# Patient Record
Sex: Male | Born: 1940 | Race: White | Hispanic: No | Marital: Married | Smoking: Former smoker
Health system: Southern US, Community
[De-identification: ages and names within clinical notes are randomized; demographics above are authoritative.]

## PROBLEM LIST (undated history)

## (undated) DIAGNOSIS — K219 Gastro-esophageal reflux disease without esophagitis: Secondary | ICD-10-CM

## (undated) DIAGNOSIS — Z974 Presence of external hearing-aid: Secondary | ICD-10-CM

## (undated) DIAGNOSIS — K7460 Unspecified cirrhosis of liver: Secondary | ICD-10-CM

## (undated) DIAGNOSIS — E785 Hyperlipidemia, unspecified: Secondary | ICD-10-CM

## (undated) DIAGNOSIS — G8929 Other chronic pain: Secondary | ICD-10-CM

## (undated) DIAGNOSIS — G7000 Myasthenia gravis without (acute) exacerbation: Secondary | ICD-10-CM

## (undated) DIAGNOSIS — D0361 Melanoma in situ of right upper limb, including shoulder: Secondary | ICD-10-CM

## (undated) DIAGNOSIS — D500 Iron deficiency anemia secondary to blood loss (chronic): Secondary | ICD-10-CM

## (undated) DIAGNOSIS — R011 Cardiac murmur, unspecified: Secondary | ICD-10-CM

## (undated) DIAGNOSIS — C439 Malignant melanoma of skin, unspecified: Secondary | ICD-10-CM

## (undated) DIAGNOSIS — N200 Calculus of kidney: Secondary | ICD-10-CM

## (undated) DIAGNOSIS — Z6839 Body mass index (BMI) 39.0-39.9, adult: Secondary | ICD-10-CM

## (undated) DIAGNOSIS — T7840XA Allergy, unspecified, initial encounter: Secondary | ICD-10-CM

## (undated) DIAGNOSIS — E119 Type 2 diabetes mellitus without complications: Secondary | ICD-10-CM

## (undated) DIAGNOSIS — I10 Essential (primary) hypertension: Secondary | ICD-10-CM

## (undated) DIAGNOSIS — M549 Dorsalgia, unspecified: Secondary | ICD-10-CM

## (undated) DIAGNOSIS — E291 Testicular hypofunction: Secondary | ICD-10-CM

## (undated) DIAGNOSIS — G4730 Sleep apnea, unspecified: Secondary | ICD-10-CM

## (undated) DIAGNOSIS — I2510 Atherosclerotic heart disease of native coronary artery without angina pectoris: Secondary | ICD-10-CM

## (undated) DIAGNOSIS — R748 Abnormal levels of other serum enzymes: Secondary | ICD-10-CM

## (undated) DIAGNOSIS — M1990 Unspecified osteoarthritis, unspecified site: Secondary | ICD-10-CM

## (undated) HISTORY — DX: Essential (primary) hypertension: I10

## (undated) HISTORY — DX: Malignant melanoma of skin, unspecified: C43.9

## (undated) HISTORY — DX: Body mass index (BMI) 39.0-39.9, adult: Z68.39

## (undated) HISTORY — DX: Atherosclerotic heart disease of native coronary artery without angina pectoris: I25.10

## (undated) HISTORY — DX: Melanoma in situ of right upper limb, including shoulder: D03.61

## (undated) HISTORY — DX: Hyperlipidemia, unspecified: E78.5

## (undated) HISTORY — DX: Sleep apnea, unspecified: G47.30

## (undated) HISTORY — DX: Allergy, unspecified, initial encounter: T78.40XA

## (undated) HISTORY — DX: Abnormal levels of other serum enzymes: R74.8

## (undated) HISTORY — PX: EYE SURGERY: SHX253

## (undated) HISTORY — DX: Calculus of kidney: N20.0

## (undated) HISTORY — DX: Unspecified osteoarthritis, unspecified site: M19.90

## (undated) HISTORY — PX: LIVER BIOPSY: SHX301

## (undated) HISTORY — DX: Other chronic pain: G89.29

## (undated) HISTORY — PX: CATARACT EXTRACTION, BILATERAL: SHX1313

## (undated) HISTORY — DX: Dorsalgia, unspecified: M54.9

## (undated) HISTORY — PX: TONSILLECTOMY: SUR1361

## (undated) HISTORY — DX: Iron deficiency anemia secondary to blood loss (chronic): D50.0

## (undated) HISTORY — PX: CARDIAC CATHETERIZATION: SHX172

## (undated) HISTORY — DX: Type 2 diabetes mellitus without complications: E11.9

## (undated) HISTORY — PX: SKIN LESION EXCISION: SHX2412

## (undated) HISTORY — DX: Cardiac murmur, unspecified: R01.1

## (undated) HISTORY — DX: Testicular hypofunction: E29.1

## (undated) HISTORY — DX: Myasthenia gravis without (acute) exacerbation: G70.00

## (undated) HISTORY — PX: CARPAL TUNNEL RELEASE: SHX101

---

## 1996-11-08 HISTORY — PX: SKIN LESION EXCISION: SHX2412

## 1997-11-08 HISTORY — PX: ANGIOPLASTY: SHX39

## 2011-11-15 DIAGNOSIS — I10 Essential (primary) hypertension: Secondary | ICD-10-CM | POA: Diagnosis not present

## 2011-11-15 DIAGNOSIS — H5200 Hypermetropia, unspecified eye: Secondary | ICD-10-CM | POA: Diagnosis not present

## 2011-11-15 DIAGNOSIS — E7800 Pure hypercholesterolemia, unspecified: Secondary | ICD-10-CM | POA: Diagnosis not present

## 2011-11-15 DIAGNOSIS — E119 Type 2 diabetes mellitus without complications: Secondary | ICD-10-CM | POA: Diagnosis not present

## 2012-01-14 DIAGNOSIS — E119 Type 2 diabetes mellitus without complications: Secondary | ICD-10-CM | POA: Diagnosis not present

## 2012-01-14 DIAGNOSIS — E7800 Pure hypercholesterolemia, unspecified: Secondary | ICD-10-CM | POA: Diagnosis not present

## 2012-01-14 DIAGNOSIS — E785 Hyperlipidemia, unspecified: Secondary | ICD-10-CM | POA: Diagnosis not present

## 2012-01-14 DIAGNOSIS — I10 Essential (primary) hypertension: Secondary | ICD-10-CM | POA: Diagnosis not present

## 2012-01-26 DIAGNOSIS — R945 Abnormal results of liver function studies: Secondary | ICD-10-CM | POA: Diagnosis not present

## 2012-02-02 DIAGNOSIS — Z85820 Personal history of malignant melanoma of skin: Secondary | ICD-10-CM | POA: Diagnosis not present

## 2012-02-02 DIAGNOSIS — D485 Neoplasm of uncertain behavior of skin: Secondary | ICD-10-CM | POA: Diagnosis not present

## 2012-02-02 DIAGNOSIS — L259 Unspecified contact dermatitis, unspecified cause: Secondary | ICD-10-CM | POA: Diagnosis not present

## 2012-02-02 DIAGNOSIS — D294 Benign neoplasm of scrotum: Secondary | ICD-10-CM | POA: Diagnosis not present

## 2012-03-15 DIAGNOSIS — L259 Unspecified contact dermatitis, unspecified cause: Secondary | ICD-10-CM | POA: Diagnosis not present

## 2012-03-15 DIAGNOSIS — L821 Other seborrheic keratosis: Secondary | ICD-10-CM | POA: Diagnosis not present

## 2012-06-20 DIAGNOSIS — Z0000 Encounter for general adult medical examination without abnormal findings: Secondary | ICD-10-CM | POA: Diagnosis not present

## 2012-06-20 DIAGNOSIS — E119 Type 2 diabetes mellitus without complications: Secondary | ICD-10-CM | POA: Diagnosis not present

## 2012-06-20 DIAGNOSIS — Z125 Encounter for screening for malignant neoplasm of prostate: Secondary | ICD-10-CM | POA: Diagnosis not present

## 2012-06-20 DIAGNOSIS — I10 Essential (primary) hypertension: Secondary | ICD-10-CM | POA: Diagnosis not present

## 2012-06-20 DIAGNOSIS — E785 Hyperlipidemia, unspecified: Secondary | ICD-10-CM | POA: Diagnosis not present

## 2012-06-20 DIAGNOSIS — E291 Testicular hypofunction: Secondary | ICD-10-CM | POA: Diagnosis not present

## 2012-08-01 DIAGNOSIS — Z23 Encounter for immunization: Secondary | ICD-10-CM | POA: Diagnosis not present

## 2012-08-01 DIAGNOSIS — R6889 Other general symptoms and signs: Secondary | ICD-10-CM | POA: Diagnosis not present

## 2012-09-20 DIAGNOSIS — I10 Essential (primary) hypertension: Secondary | ICD-10-CM | POA: Diagnosis not present

## 2012-09-20 DIAGNOSIS — E119 Type 2 diabetes mellitus without complications: Secondary | ICD-10-CM | POA: Diagnosis not present

## 2012-09-20 DIAGNOSIS — E785 Hyperlipidemia, unspecified: Secondary | ICD-10-CM | POA: Diagnosis not present

## 2012-09-20 DIAGNOSIS — R7989 Other specified abnormal findings of blood chemistry: Secondary | ICD-10-CM | POA: Diagnosis not present

## 2012-09-27 DIAGNOSIS — M546 Pain in thoracic spine: Secondary | ICD-10-CM | POA: Diagnosis not present

## 2012-09-27 DIAGNOSIS — M999 Biomechanical lesion, unspecified: Secondary | ICD-10-CM | POA: Diagnosis not present

## 2012-09-27 DIAGNOSIS — M545 Low back pain: Secondary | ICD-10-CM | POA: Diagnosis not present

## 2012-09-29 DIAGNOSIS — M546 Pain in thoracic spine: Secondary | ICD-10-CM | POA: Diagnosis not present

## 2012-09-29 DIAGNOSIS — M999 Biomechanical lesion, unspecified: Secondary | ICD-10-CM | POA: Diagnosis not present

## 2012-09-29 DIAGNOSIS — M545 Low back pain: Secondary | ICD-10-CM | POA: Diagnosis not present

## 2012-10-02 DIAGNOSIS — M999 Biomechanical lesion, unspecified: Secondary | ICD-10-CM | POA: Diagnosis not present

## 2012-10-02 DIAGNOSIS — M546 Pain in thoracic spine: Secondary | ICD-10-CM | POA: Diagnosis not present

## 2012-10-02 DIAGNOSIS — M545 Low back pain: Secondary | ICD-10-CM | POA: Diagnosis not present

## 2012-10-03 DIAGNOSIS — M545 Low back pain: Secondary | ICD-10-CM | POA: Diagnosis not present

## 2012-10-03 DIAGNOSIS — M546 Pain in thoracic spine: Secondary | ICD-10-CM | POA: Diagnosis not present

## 2012-10-03 DIAGNOSIS — M999 Biomechanical lesion, unspecified: Secondary | ICD-10-CM | POA: Diagnosis not present

## 2012-10-04 DIAGNOSIS — M546 Pain in thoracic spine: Secondary | ICD-10-CM | POA: Diagnosis not present

## 2012-10-04 DIAGNOSIS — M545 Low back pain: Secondary | ICD-10-CM | POA: Diagnosis not present

## 2012-10-04 DIAGNOSIS — M999 Biomechanical lesion, unspecified: Secondary | ICD-10-CM | POA: Diagnosis not present

## 2012-10-09 DIAGNOSIS — M546 Pain in thoracic spine: Secondary | ICD-10-CM | POA: Diagnosis not present

## 2012-10-09 DIAGNOSIS — M999 Biomechanical lesion, unspecified: Secondary | ICD-10-CM | POA: Diagnosis not present

## 2012-10-09 DIAGNOSIS — M545 Low back pain: Secondary | ICD-10-CM | POA: Diagnosis not present

## 2012-10-10 DIAGNOSIS — M546 Pain in thoracic spine: Secondary | ICD-10-CM | POA: Diagnosis not present

## 2012-10-10 DIAGNOSIS — M545 Low back pain: Secondary | ICD-10-CM | POA: Diagnosis not present

## 2012-10-10 DIAGNOSIS — M999 Biomechanical lesion, unspecified: Secondary | ICD-10-CM | POA: Diagnosis not present

## 2012-10-11 DIAGNOSIS — M999 Biomechanical lesion, unspecified: Secondary | ICD-10-CM | POA: Diagnosis not present

## 2012-10-11 DIAGNOSIS — M546 Pain in thoracic spine: Secondary | ICD-10-CM | POA: Diagnosis not present

## 2012-10-11 DIAGNOSIS — M545 Low back pain: Secondary | ICD-10-CM | POA: Diagnosis not present

## 2012-10-13 DIAGNOSIS — M999 Biomechanical lesion, unspecified: Secondary | ICD-10-CM | POA: Diagnosis not present

## 2012-10-13 DIAGNOSIS — M546 Pain in thoracic spine: Secondary | ICD-10-CM | POA: Diagnosis not present

## 2012-10-13 DIAGNOSIS — M545 Low back pain: Secondary | ICD-10-CM | POA: Diagnosis not present

## 2012-10-16 DIAGNOSIS — M542 Cervicalgia: Secondary | ICD-10-CM | POA: Diagnosis not present

## 2012-10-16 DIAGNOSIS — M545 Low back pain: Secondary | ICD-10-CM | POA: Diagnosis not present

## 2012-10-16 DIAGNOSIS — M9981 Other biomechanical lesions of cervical region: Secondary | ICD-10-CM | POA: Diagnosis not present

## 2012-10-16 DIAGNOSIS — M546 Pain in thoracic spine: Secondary | ICD-10-CM | POA: Diagnosis not present

## 2012-10-16 DIAGNOSIS — M999 Biomechanical lesion, unspecified: Secondary | ICD-10-CM | POA: Diagnosis not present

## 2012-10-17 DIAGNOSIS — M545 Low back pain: Secondary | ICD-10-CM | POA: Diagnosis not present

## 2012-10-17 DIAGNOSIS — M546 Pain in thoracic spine: Secondary | ICD-10-CM | POA: Diagnosis not present

## 2012-10-17 DIAGNOSIS — M999 Biomechanical lesion, unspecified: Secondary | ICD-10-CM | POA: Diagnosis not present

## 2012-10-18 DIAGNOSIS — M999 Biomechanical lesion, unspecified: Secondary | ICD-10-CM | POA: Diagnosis not present

## 2012-10-18 DIAGNOSIS — M546 Pain in thoracic spine: Secondary | ICD-10-CM | POA: Diagnosis not present

## 2012-10-18 DIAGNOSIS — M545 Low back pain: Secondary | ICD-10-CM | POA: Diagnosis not present

## 2012-10-20 DIAGNOSIS — M545 Low back pain: Secondary | ICD-10-CM | POA: Diagnosis not present

## 2012-10-20 DIAGNOSIS — M999 Biomechanical lesion, unspecified: Secondary | ICD-10-CM | POA: Diagnosis not present

## 2012-10-20 DIAGNOSIS — M546 Pain in thoracic spine: Secondary | ICD-10-CM | POA: Diagnosis not present

## 2012-11-17 DIAGNOSIS — E119 Type 2 diabetes mellitus without complications: Secondary | ICD-10-CM | POA: Diagnosis not present

## 2012-12-19 DIAGNOSIS — E119 Type 2 diabetes mellitus without complications: Secondary | ICD-10-CM | POA: Diagnosis not present

## 2012-12-19 DIAGNOSIS — E785 Hyperlipidemia, unspecified: Secondary | ICD-10-CM | POA: Diagnosis not present

## 2012-12-19 DIAGNOSIS — I10 Essential (primary) hypertension: Secondary | ICD-10-CM | POA: Diagnosis not present

## 2013-01-24 DIAGNOSIS — L259 Unspecified contact dermatitis, unspecified cause: Secondary | ICD-10-CM | POA: Diagnosis not present

## 2013-01-24 DIAGNOSIS — Z85820 Personal history of malignant melanoma of skin: Secondary | ICD-10-CM | POA: Diagnosis not present

## 2013-01-24 DIAGNOSIS — L570 Actinic keratosis: Secondary | ICD-10-CM | POA: Diagnosis not present

## 2013-03-21 DIAGNOSIS — E119 Type 2 diabetes mellitus without complications: Secondary | ICD-10-CM | POA: Diagnosis not present

## 2013-03-21 DIAGNOSIS — I10 Essential (primary) hypertension: Secondary | ICD-10-CM | POA: Diagnosis not present

## 2013-06-26 DIAGNOSIS — E291 Testicular hypofunction: Secondary | ICD-10-CM | POA: Diagnosis not present

## 2013-06-26 DIAGNOSIS — I10 Essential (primary) hypertension: Secondary | ICD-10-CM | POA: Diagnosis not present

## 2013-06-26 DIAGNOSIS — Z0000 Encounter for general adult medical examination without abnormal findings: Secondary | ICD-10-CM | POA: Diagnosis not present

## 2013-06-26 DIAGNOSIS — I2510 Atherosclerotic heart disease of native coronary artery without angina pectoris: Secondary | ICD-10-CM | POA: Diagnosis not present

## 2013-06-26 DIAGNOSIS — E119 Type 2 diabetes mellitus without complications: Secondary | ICD-10-CM | POA: Diagnosis not present

## 2013-06-26 DIAGNOSIS — Z125 Encounter for screening for malignant neoplasm of prostate: Secondary | ICD-10-CM | POA: Diagnosis not present

## 2013-06-26 DIAGNOSIS — E785 Hyperlipidemia, unspecified: Secondary | ICD-10-CM | POA: Diagnosis not present

## 2013-07-20 DIAGNOSIS — Z23 Encounter for immunization: Secondary | ICD-10-CM | POA: Diagnosis not present

## 2013-10-09 DIAGNOSIS — E119 Type 2 diabetes mellitus without complications: Secondary | ICD-10-CM | POA: Diagnosis not present

## 2013-10-09 DIAGNOSIS — I10 Essential (primary) hypertension: Secondary | ICD-10-CM | POA: Diagnosis not present

## 2013-10-09 DIAGNOSIS — E785 Hyperlipidemia, unspecified: Secondary | ICD-10-CM | POA: Diagnosis not present

## 2014-01-09 DIAGNOSIS — I10 Essential (primary) hypertension: Secondary | ICD-10-CM | POA: Diagnosis not present

## 2014-01-09 DIAGNOSIS — E785 Hyperlipidemia, unspecified: Secondary | ICD-10-CM | POA: Diagnosis not present

## 2014-01-09 DIAGNOSIS — E119 Type 2 diabetes mellitus without complications: Secondary | ICD-10-CM | POA: Diagnosis not present

## 2014-01-24 DIAGNOSIS — L738 Other specified follicular disorders: Secondary | ICD-10-CM | POA: Diagnosis not present

## 2014-01-24 DIAGNOSIS — D239 Other benign neoplasm of skin, unspecified: Secondary | ICD-10-CM | POA: Diagnosis not present

## 2014-01-24 DIAGNOSIS — L820 Inflamed seborrheic keratosis: Secondary | ICD-10-CM | POA: Diagnosis not present

## 2014-01-24 DIAGNOSIS — Z85820 Personal history of malignant melanoma of skin: Secondary | ICD-10-CM | POA: Diagnosis not present

## 2014-01-24 DIAGNOSIS — L570 Actinic keratosis: Secondary | ICD-10-CM | POA: Diagnosis not present

## 2014-01-24 DIAGNOSIS — I8310 Varicose veins of unspecified lower extremity with inflammation: Secondary | ICD-10-CM | POA: Diagnosis not present

## 2014-03-25 DIAGNOSIS — H2510 Age-related nuclear cataract, unspecified eye: Secondary | ICD-10-CM | POA: Diagnosis not present

## 2014-03-29 DIAGNOSIS — H2510 Age-related nuclear cataract, unspecified eye: Secondary | ICD-10-CM | POA: Diagnosis not present

## 2014-03-29 DIAGNOSIS — I10 Essential (primary) hypertension: Secondary | ICD-10-CM | POA: Diagnosis not present

## 2014-03-29 DIAGNOSIS — H18419 Arcus senilis, unspecified eye: Secondary | ICD-10-CM | POA: Diagnosis not present

## 2014-03-29 DIAGNOSIS — E119 Type 2 diabetes mellitus without complications: Secondary | ICD-10-CM | POA: Diagnosis not present

## 2014-04-15 DIAGNOSIS — H2510 Age-related nuclear cataract, unspecified eye: Secondary | ICD-10-CM | POA: Diagnosis not present

## 2014-04-18 DIAGNOSIS — I10 Essential (primary) hypertension: Secondary | ICD-10-CM | POA: Diagnosis not present

## 2014-04-18 DIAGNOSIS — E119 Type 2 diabetes mellitus without complications: Secondary | ICD-10-CM | POA: Diagnosis not present

## 2014-04-18 DIAGNOSIS — E7800 Pure hypercholesterolemia, unspecified: Secondary | ICD-10-CM | POA: Diagnosis not present

## 2014-05-27 DIAGNOSIS — H2510 Age-related nuclear cataract, unspecified eye: Secondary | ICD-10-CM | POA: Diagnosis not present

## 2014-05-27 DIAGNOSIS — H269 Unspecified cataract: Secondary | ICD-10-CM | POA: Diagnosis not present

## 2014-05-28 DIAGNOSIS — H2510 Age-related nuclear cataract, unspecified eye: Secondary | ICD-10-CM | POA: Diagnosis not present

## 2014-06-21 DIAGNOSIS — H269 Unspecified cataract: Secondary | ICD-10-CM | POA: Diagnosis not present

## 2014-06-21 DIAGNOSIS — H2510 Age-related nuclear cataract, unspecified eye: Secondary | ICD-10-CM | POA: Diagnosis not present

## 2014-06-27 DIAGNOSIS — E291 Testicular hypofunction: Secondary | ICD-10-CM | POA: Diagnosis not present

## 2014-06-27 DIAGNOSIS — Z125 Encounter for screening for malignant neoplasm of prostate: Secondary | ICD-10-CM | POA: Diagnosis not present

## 2014-06-27 DIAGNOSIS — I10 Essential (primary) hypertension: Secondary | ICD-10-CM | POA: Diagnosis not present

## 2014-06-27 DIAGNOSIS — I2510 Atherosclerotic heart disease of native coronary artery without angina pectoris: Secondary | ICD-10-CM | POA: Diagnosis not present

## 2014-06-27 DIAGNOSIS — Z0000 Encounter for general adult medical examination without abnormal findings: Secondary | ICD-10-CM | POA: Diagnosis not present

## 2014-06-27 DIAGNOSIS — E119 Type 2 diabetes mellitus without complications: Secondary | ICD-10-CM | POA: Diagnosis not present

## 2014-06-27 DIAGNOSIS — E785 Hyperlipidemia, unspecified: Secondary | ICD-10-CM | POA: Diagnosis not present

## 2014-07-24 DIAGNOSIS — R7989 Other specified abnormal findings of blood chemistry: Secondary | ICD-10-CM | POA: Diagnosis not present

## 2014-07-24 DIAGNOSIS — Z23 Encounter for immunization: Secondary | ICD-10-CM | POA: Diagnosis not present

## 2014-08-01 ENCOUNTER — Ambulatory Visit: Payer: Self-pay | Admitting: Family Medicine

## 2014-08-01 DIAGNOSIS — R945 Abnormal results of liver function studies: Secondary | ICD-10-CM | POA: Diagnosis not present

## 2014-08-01 DIAGNOSIS — R748 Abnormal levels of other serum enzymes: Secondary | ICD-10-CM | POA: Diagnosis not present

## 2014-08-08 ENCOUNTER — Ambulatory Visit: Payer: Self-pay | Admitting: Gastroenterology

## 2014-08-08 DIAGNOSIS — Z9861 Coronary angioplasty status: Secondary | ICD-10-CM | POA: Diagnosis not present

## 2014-08-08 DIAGNOSIS — Z9841 Cataract extraction status, right eye: Secondary | ICD-10-CM | POA: Diagnosis not present

## 2014-08-08 DIAGNOSIS — G4730 Sleep apnea, unspecified: Secondary | ICD-10-CM | POA: Diagnosis not present

## 2014-08-08 DIAGNOSIS — Z87891 Personal history of nicotine dependence: Secondary | ICD-10-CM | POA: Diagnosis not present

## 2014-08-08 DIAGNOSIS — Z79899 Other long term (current) drug therapy: Secondary | ICD-10-CM | POA: Diagnosis not present

## 2014-08-08 DIAGNOSIS — D123 Benign neoplasm of transverse colon: Secondary | ICD-10-CM | POA: Diagnosis not present

## 2014-08-08 DIAGNOSIS — D125 Benign neoplasm of sigmoid colon: Secondary | ICD-10-CM | POA: Diagnosis not present

## 2014-08-08 DIAGNOSIS — D124 Benign neoplasm of descending colon: Secondary | ICD-10-CM | POA: Diagnosis not present

## 2014-08-08 DIAGNOSIS — D126 Benign neoplasm of colon, unspecified: Secondary | ICD-10-CM | POA: Diagnosis not present

## 2014-08-08 DIAGNOSIS — Z1211 Encounter for screening for malignant neoplasm of colon: Secondary | ICD-10-CM | POA: Diagnosis not present

## 2014-08-08 DIAGNOSIS — Z7982 Long term (current) use of aspirin: Secondary | ICD-10-CM | POA: Diagnosis not present

## 2014-08-08 DIAGNOSIS — Z9842 Cataract extraction status, left eye: Secondary | ICD-10-CM | POA: Diagnosis not present

## 2014-09-24 DIAGNOSIS — G4730 Sleep apnea, unspecified: Secondary | ICD-10-CM | POA: Diagnosis not present

## 2014-09-24 DIAGNOSIS — R748 Abnormal levels of other serum enzymes: Secondary | ICD-10-CM | POA: Diagnosis not present

## 2014-09-24 DIAGNOSIS — E785 Hyperlipidemia, unspecified: Secondary | ICD-10-CM | POA: Diagnosis not present

## 2014-09-24 DIAGNOSIS — E119 Type 2 diabetes mellitus without complications: Secondary | ICD-10-CM | POA: Diagnosis not present

## 2014-09-24 DIAGNOSIS — J309 Allergic rhinitis, unspecified: Secondary | ICD-10-CM | POA: Diagnosis not present

## 2014-09-24 DIAGNOSIS — E291 Testicular hypofunction: Secondary | ICD-10-CM | POA: Diagnosis not present

## 2014-09-24 DIAGNOSIS — I2510 Atherosclerotic heart disease of native coronary artery without angina pectoris: Secondary | ICD-10-CM | POA: Diagnosis not present

## 2014-09-24 DIAGNOSIS — I10 Essential (primary) hypertension: Secondary | ICD-10-CM | POA: Diagnosis not present

## 2014-12-26 DIAGNOSIS — I10 Essential (primary) hypertension: Secondary | ICD-10-CM | POA: Diagnosis not present

## 2014-12-26 DIAGNOSIS — E785 Hyperlipidemia, unspecified: Secondary | ICD-10-CM | POA: Diagnosis not present

## 2014-12-26 DIAGNOSIS — E119 Type 2 diabetes mellitus without complications: Secondary | ICD-10-CM | POA: Diagnosis not present

## 2015-01-29 DIAGNOSIS — Z1283 Encounter for screening for malignant neoplasm of skin: Secondary | ICD-10-CM | POA: Diagnosis not present

## 2015-01-29 DIAGNOSIS — L821 Other seborrheic keratosis: Secondary | ICD-10-CM | POA: Diagnosis not present

## 2015-01-29 DIAGNOSIS — L718 Other rosacea: Secondary | ICD-10-CM | POA: Diagnosis not present

## 2015-01-29 DIAGNOSIS — L570 Actinic keratosis: Secondary | ICD-10-CM | POA: Diagnosis not present

## 2015-01-29 DIAGNOSIS — L300 Nummular dermatitis: Secondary | ICD-10-CM | POA: Diagnosis not present

## 2015-01-29 DIAGNOSIS — Z85820 Personal history of malignant melanoma of skin: Secondary | ICD-10-CM | POA: Diagnosis not present

## 2015-01-29 DIAGNOSIS — L905 Scar conditions and fibrosis of skin: Secondary | ICD-10-CM | POA: Diagnosis not present

## 2015-01-29 DIAGNOSIS — I8310 Varicose veins of unspecified lower extremity with inflammation: Secondary | ICD-10-CM | POA: Diagnosis not present

## 2015-03-11 DIAGNOSIS — I10 Essential (primary) hypertension: Secondary | ICD-10-CM | POA: Diagnosis not present

## 2015-03-11 DIAGNOSIS — E119 Type 2 diabetes mellitus without complications: Secondary | ICD-10-CM | POA: Diagnosis not present

## 2015-03-11 DIAGNOSIS — E785 Hyperlipidemia, unspecified: Secondary | ICD-10-CM | POA: Diagnosis not present

## 2015-05-12 DIAGNOSIS — E11329 Type 2 diabetes mellitus with mild nonproliferative diabetic retinopathy without macular edema: Secondary | ICD-10-CM | POA: Diagnosis not present

## 2015-07-08 ENCOUNTER — Ambulatory Visit (INDEPENDENT_AMBULATORY_CARE_PROVIDER_SITE_OTHER): Payer: Medicare Other | Admitting: Family Medicine

## 2015-07-08 ENCOUNTER — Encounter: Payer: Self-pay | Admitting: Family Medicine

## 2015-07-08 VITALS — BP 169/78 | HR 71 | Temp 98.4°F | Ht 69.0 in | Wt 254.0 lb

## 2015-07-08 DIAGNOSIS — E785 Hyperlipidemia, unspecified: Secondary | ICD-10-CM | POA: Insufficient documentation

## 2015-07-08 DIAGNOSIS — M545 Low back pain, unspecified: Secondary | ICD-10-CM | POA: Insufficient documentation

## 2015-07-08 DIAGNOSIS — I10 Essential (primary) hypertension: Secondary | ICD-10-CM

## 2015-07-08 DIAGNOSIS — N183 Chronic kidney disease, stage 3 unspecified: Secondary | ICD-10-CM

## 2015-07-08 DIAGNOSIS — E1169 Type 2 diabetes mellitus with other specified complication: Secondary | ICD-10-CM

## 2015-07-08 DIAGNOSIS — E119 Type 2 diabetes mellitus without complications: Secondary | ICD-10-CM | POA: Diagnosis not present

## 2015-07-08 DIAGNOSIS — I2510 Atherosclerotic heart disease of native coronary artery without angina pectoris: Secondary | ICD-10-CM | POA: Diagnosis not present

## 2015-07-08 DIAGNOSIS — I129 Hypertensive chronic kidney disease with stage 1 through stage 4 chronic kidney disease, or unspecified chronic kidney disease: Secondary | ICD-10-CM | POA: Insufficient documentation

## 2015-07-08 DIAGNOSIS — Z0000 Encounter for general adult medical examination without abnormal findings: Secondary | ICD-10-CM | POA: Diagnosis not present

## 2015-07-08 DIAGNOSIS — I2583 Coronary atherosclerosis due to lipid rich plaque: Secondary | ICD-10-CM

## 2015-07-08 DIAGNOSIS — N184 Chronic kidney disease, stage 4 (severe): Secondary | ICD-10-CM | POA: Insufficient documentation

## 2015-07-08 DIAGNOSIS — Z6839 Body mass index (BMI) 39.0-39.9, adult: Secondary | ICD-10-CM

## 2015-07-08 DIAGNOSIS — N400 Enlarged prostate without lower urinary tract symptoms: Secondary | ICD-10-CM | POA: Diagnosis not present

## 2015-07-08 DIAGNOSIS — Z125 Encounter for screening for malignant neoplasm of prostate: Secondary | ICD-10-CM | POA: Diagnosis not present

## 2015-07-08 DIAGNOSIS — G4730 Sleep apnea, unspecified: Secondary | ICD-10-CM | POA: Diagnosis not present

## 2015-07-08 DIAGNOSIS — E1159 Type 2 diabetes mellitus with other circulatory complications: Secondary | ICD-10-CM

## 2015-07-08 DIAGNOSIS — E669 Obesity, unspecified: Secondary | ICD-10-CM | POA: Insufficient documentation

## 2015-07-08 DIAGNOSIS — Z6834 Body mass index (BMI) 34.0-34.9, adult: Secondary | ICD-10-CM | POA: Insufficient documentation

## 2015-07-08 LAB — MICROSCOPIC EXAMINATION

## 2015-07-08 LAB — URINALYSIS, ROUTINE W REFLEX MICROSCOPIC
Bilirubin, UA: NEGATIVE
GLUCOSE, UA: NEGATIVE
LEUKOCYTES UA: NEGATIVE
Nitrite, UA: NEGATIVE
RBC, UA: NEGATIVE
Specific Gravity, UA: 1.025 (ref 1.005–1.030)
Urobilinogen, Ur: 2 mg/dL — ABNORMAL HIGH (ref 0.2–1.0)
pH, UA: 5 (ref 5.0–7.5)

## 2015-07-08 LAB — BAYER DCA HB A1C WAIVED: HB A1C (BAYER DCA - WAIVED): 6.3 % (ref <7.0)

## 2015-07-08 MED ORDER — LOSARTAN POTASSIUM 100 MG PO TABS: 100.0000 mg | ORAL_TABLET | Freq: Every day | ORAL | Status: DC

## 2015-07-08 MED ORDER — SIMVASTATIN 40 MG PO TABS: 40.0000 mg | ORAL_TABLET | Freq: Every day | ORAL | Status: DC

## 2015-07-08 MED ORDER — ATENOLOL 50 MG PO TABS: 50.0000 mg | ORAL_TABLET | Freq: Every day | ORAL | Status: DC

## 2015-07-08 MED ORDER — MELOXICAM 15 MG PO TABS
15.0000 mg | ORAL_TABLET | Freq: Every day | ORAL | Status: DC
Start: 2015-07-08 — End: 2016-06-10

## 2015-07-08 MED ORDER — METFORMIN HCL 500 MG PO TABS: 1000.0000 mg | ORAL_TABLET | Freq: Two times a day (BID) | ORAL | Status: DC

## 2015-07-08 MED ORDER — GLIPIZIDE 5 MG PO TABS: 5.0000 mg | ORAL_TABLET | Freq: Every day | ORAL | Status: DC

## 2015-07-08 NOTE — Assessment & Plan Note (Signed)
No sx 

## 2015-07-08 NOTE — Progress Notes (Signed)
BP 169/78 mmHg  Pulse 71  Temp(Src) 98.4 F (36.9 C)  Ht 5\' 9"  (1.753 m)  Wt 254 lb (115.214 kg)  BMI 37.49 kg/m2  SpO2 98%   Subjective:    Patient ID: Mason Wilson., male    DOB: 18-Apr-1941, 74 y.o.   MRN: [id number]  HPI: Antionio Wilson is a 74 y.o. male  Chief Complaint  Patient presents with  . Annual Exam   patient with chronic intermittent back pain that sometimes maybe twice a year becomes very severe. It lasts about about 4-6 weeks in the severe category has worked with chiropractor in the past Try to get in here but due to confusion no messages were past are received. Age with no blood in his urine during this time was having marked left mid flank pain. Felt like a knife stuck in his back this severe pain lasting 4-6 weeks. Pain is now down to modest pain has quit taking Tylenol and just has occasional twinges To continue functioning during the day patient takes 4 Tylenol in the morning and 4 in the evening area and Patient also during back pain spells with no radicular symptoms  No blood sugar issues no complaints from medications or no hypoglycemic spells  Blood pressure had previously been down along with weight loss patient's weights back up as is his blood pressure.  Doing well with lipids no complaints  Relevant past medical, surgical, family and social history reviewed and updated as indicated. Interim medical history since our last visit reviewed. Allergies and medications reviewed and updated.  Review of Systems  Constitutional: Negative.   HENT: Negative.   Eyes: Negative.   Respiratory: Negative.   Cardiovascular: Negative.   Endocrine: Negative.   Musculoskeletal: Negative.   Skin: Negative.   Allergic/Immunologic: Negative.   Neurological: Negative.   Hematological: Negative.   Psychiatric/Behavioral: Negative.     Per HPI unless specifically indicated above     Objective:    BP 169/78 mmHg  Pulse 71  Temp(Src) 98.4 F (36.9  C)  Ht 5\' 9"  (1.753 m)  Wt 254 lb (115.214 kg)  BMI 37.49 kg/m2  SpO2 98%  Wt Readings from Last 3 Encounters:  07/08/15 254 lb (115.214 kg)  03/11/15 258 lb (117.028 kg)    Physical Exam  Constitutional: He is oriented to person, place, and time. He appears well-developed and well-nourished.  HENT:  Head: Normocephalic and atraumatic.  Right Ear: External ear normal.  Left Ear: External ear normal.  Eyes: Conjunctivae and EOM are normal. Pupils are equal, round, and reactive to light.  Neck: Normal range of motion. Neck supple.  Cardiovascular: Normal rate, regular rhythm, normal heart sounds and intact distal pulses.   Pulmonary/Chest: Effort normal and breath sounds normal.  Abdominal: Soft. Bowel sounds are normal. There is no splenomegaly or hepatomegaly.  Genitourinary: Rectum normal, prostate normal and penis normal.  Musculoskeletal: Normal range of motion.  Neurological: He is alert and oriented to person, place, and time. He has normal reflexes.  Skin: No rash noted. No erythema.  Psychiatric: He has a normal mood and affect. His behavior is normal. Judgment and thought content normal.    No results found for this or any previous visit.    Assessment & Plan:   Problem List Items Addressed This Visit      Cardiovascular and Mediastinum   Hypertension - Primary    The current medical regimen is effective;  continue present plan and medications. Diet  and exercise      Relevant Medications   simvastatin (ZOCOR) 40 MG tablet   losartan (COZAAR) 100 MG tablet   atenolol (TENORMIN) 50 MG tablet   Other Relevant Orders   TSH   CAD (coronary artery disease)    No sx      Relevant Medications   simvastatin (ZOCOR) 40 MG tablet   losartan (COZAAR) 100 MG tablet   atenolol (TENORMIN) 50 MG tablet   Other Relevant Orders   Lipid panel   CBC with Differential/Platelet   TSH     Endocrine   Diabetes mellitus without complication    The current medical regimen  is effective;  continue present plan and medications.       Relevant Medications   simvastatin (ZOCOR) 40 MG tablet   metFORMIN (GLUCOPHAGE) 500 MG tablet   losartan (COZAAR) 100 MG tablet   glipiZIDE (GLUCOTROL) 5 MG tablet   Other Relevant Orders   Comprehensive metabolic panel   TSH   Bayer DCA Hb A1c Waived     Genitourinary   Chronic kidney disease   Relevant Orders   Urinalysis, Routine w reflex microscopic (not at [facility])   TSH   BPH (benign prostatic hyperplasia)   Relevant Orders   PSA     Other   Hyperlipidemia   Relevant Medications   simvastatin (ZOCOR) 40 MG tablet   losartan (COZAAR) 100 MG tablet   atenolol (TENORMIN) 50 MG tablet   Other Relevant Orders   TSH   Body mass index 39.0-39.9, adult   Relevant Orders   TSH   Sleep apnea   Relevant Orders   TSH   Low back pain    Patient with recurrent and chronic left low back pain without radiation Reports from chiropractor indicating extensive arthritis changes. Will refer to Dr. Cheri Fowler [place] at [facility] to further evaluate      Relevant Medications   meloxicam (MOBIC) 15 MG tablet   Other Relevant Orders   Ambulatory referral to Orthopedic Surgery    Other Visit Diagnoses    PE (physical exam), annual        Relevant Orders    Comprehensive metabolic panel    Lipid panel    CBC with Differential/Platelet    PSA    Urinalysis, Routine w reflex microscopic (not at [facility])    TSH        Follow up plan: Return in about 3 months (around 10/08/2015), or if symptoms worsen or fail to improve, for med check.

## 2015-07-08 NOTE — Assessment & Plan Note (Signed)
Patient with recurrent and chronic left low back pain without radiation Reports from chiropractor indicating extensive arthritis changes. Will refer to Dr. Cheri Fowler [place] at [place] clinic to further evaluate

## 2015-07-08 NOTE — Assessment & Plan Note (Signed)
The current medical regimen is effective;  continue present plan and medications.  

## 2015-07-08 NOTE — Assessment & Plan Note (Signed)
The current medical regimen is effective;  continue present plan and medications. Diet and exercise

## 2015-07-09 ENCOUNTER — Other Ambulatory Visit: Payer: Self-pay | Admitting: Family Medicine

## 2015-07-09 DIAGNOSIS — D649 Anemia, unspecified: Secondary | ICD-10-CM

## 2015-07-09 LAB — CBC WITH DIFFERENTIAL/PLATELET
BASOS ABS: 0 10*3/uL (ref 0.0–0.2)
Basos: 0 %
EOS (ABSOLUTE): 0.2 10*3/uL (ref 0.0–0.4)
Eos: 5 %
Hematocrit: 36.8 % — ABNORMAL LOW (ref 37.5–51.0)
Hemoglobin: 12.3 g/dL — ABNORMAL LOW (ref 12.6–17.7)
Immature Grans (Abs): 0 10*3/uL (ref 0.0–0.1)
Immature Granulocytes: 0 %
LYMPHS ABS: 0.8 10*3/uL (ref 0.7–3.1)
LYMPHS: 26 %
MCH: 30.4 pg (ref 26.6–33.0)
MCHC: 33.4 g/dL (ref 31.5–35.7)
MCV: 91 fL (ref 79–97)
Monocytes Absolute: 0.3 10*3/uL (ref 0.1–0.9)
Monocytes: 9 %
NEUTROS ABS: 1.9 10*3/uL (ref 1.4–7.0)
Neutrophils: 60 %
PLATELETS: 106 10*3/uL — AB (ref 150–379)
RBC: 4.04 x10E6/uL — ABNORMAL LOW (ref 4.14–5.80)
RDW: 14.1 % (ref 12.3–15.4)
WBC: 3.2 10*3/uL — ABNORMAL LOW (ref 3.4–10.8)

## 2015-07-09 LAB — LIPID PANEL
CHOL/HDL RATIO: 2.9 ratio (ref 0.0–5.0)
Cholesterol, Total: 115 mg/dL (ref 100–199)
HDL: 39 mg/dL — AB (ref >39)
LDL CALC: 54 mg/dL (ref 0–99)
Triglycerides: 111 mg/dL (ref 0–149)
VLDL Cholesterol Cal: 22 mg/dL (ref 5–40)

## 2015-07-09 LAB — COMPREHENSIVE METABOLIC PANEL
ALT: 38 IU/L (ref 0–44)
AST: 37 IU/L (ref 0–40)
Albumin/Globulin Ratio: 1.8 (ref 1.1–2.5)
Albumin: 4.2 g/dL (ref 3.5–4.8)
Alkaline Phosphatase: 107 IU/L (ref 39–117)
BUN/Creatinine Ratio: 24 — ABNORMAL HIGH (ref 10–22)
BUN: 14 mg/dL (ref 8–27)
Bilirubin Total: 0.5 mg/dL (ref 0.0–1.2)
CALCIUM: 8.7 mg/dL (ref 8.6–10.2)
CO2: 21 mmol/L (ref 18–29)
Chloride: 104 mmol/L (ref 97–108)
Creatinine, Ser: 0.59 mg/dL — ABNORMAL LOW (ref 0.76–1.27)
GFR, EST AFRICAN AMERICAN: 117 mL/min/{1.73_m2} (ref >59)
GFR, EST NON AFRICAN AMERICAN: 101 mL/min/{1.73_m2} (ref >59)
GLOBULIN, TOTAL: 2.3 g/dL (ref 1.5–4.5)
Glucose: 96 mg/dL (ref 65–99)
Potassium: 3.9 mmol/L (ref 3.5–5.2)
SODIUM: 143 mmol/L (ref 134–144)
TOTAL PROTEIN: 6.5 g/dL (ref 6.0–8.5)

## 2015-07-09 LAB — TSH: TSH: 1.5 u[IU]/mL (ref 0.450–4.500)

## 2015-07-09 LAB — PSA: PROSTATE SPECIFIC AG, SERUM: 1.6 ng/mL (ref 0.0–4.0)

## 2015-07-09 NOTE — Progress Notes (Signed)
Phone call discussed with patient hemoglobin dropped from office visit September 2015. Patient with marked flank pain other lab work was normal Patient will come back for repeat CBC, iron binding capacity, ferritin, reticulocyte count.

## 2015-07-15 ENCOUNTER — Other Ambulatory Visit: Payer: Medicare Other

## 2015-07-15 DIAGNOSIS — D649 Anemia, unspecified: Secondary | ICD-10-CM | POA: Diagnosis not present

## 2015-07-16 ENCOUNTER — Telehealth: Payer: Self-pay | Admitting: Unknown Physician Specialty

## 2015-07-16 DIAGNOSIS — D61818 Other pancytopenia: Secondary | ICD-10-CM

## 2015-07-16 LAB — CBC WITH DIFFERENTIAL/PLATELET
BASOS ABS: 0 10*3/uL (ref 0.0–0.2)
Basos: 1 %
EOS (ABSOLUTE): 0.2 10*3/uL (ref 0.0–0.4)
Eos: 5 %
Hematocrit: 35 % — ABNORMAL LOW (ref 37.5–51.0)
Hemoglobin: 11.9 g/dL — ABNORMAL LOW (ref 12.6–17.7)
Immature Grans (Abs): 0 10*3/uL (ref 0.0–0.1)
Immature Granulocytes: 0 %
LYMPHS ABS: 0.8 10*3/uL (ref 0.7–3.1)
Lymphs: 29 %
MCH: 30.5 pg (ref 26.6–33.0)
MCHC: 34 g/dL (ref 31.5–35.7)
MCV: 90 fL (ref 79–97)
MONOS ABS: 0.3 10*3/uL (ref 0.1–0.9)
Monocytes: 10 %
NEUTROS ABS: 1.6 10*3/uL (ref 1.4–7.0)
Neutrophils: 55 %
PLATELETS: 88 10*3/uL — AB (ref 150–379)
RBC: 3.9 x10E6/uL — ABNORMAL LOW (ref 4.14–5.80)
RDW: 13.6 % (ref 12.3–15.4)
WBC: 2.9 10*3/uL — ABNORMAL LOW (ref 3.4–10.8)

## 2015-07-16 LAB — IRON AND TIBC
Iron Saturation: 14 % — ABNORMAL LOW (ref 15–55)
Iron: 50 ug/dL (ref 38–169)
Total Iron Binding Capacity: 348 ug/dL (ref 250–450)
UIBC: 298 ug/dL (ref 111–343)

## 2015-07-16 LAB — RETICULOCYTES: Retic Ct Pct: 1.3 % (ref 0.6–2.6)

## 2015-07-16 LAB — FERRITIN: Ferritin: 22 ng/mL — ABNORMAL LOW (ref 30–400)

## 2015-07-16 NOTE — Telephone Encounter (Signed)
Discussed with patient that he needs to be referred to hematology.  I will set that up.  While on the phone, concerned about his BP which was high in the office and at home.  I asked him ot double his atenolol and will set up for another appointment here to evaluate.

## 2015-07-18 NOTE — Telephone Encounter (Signed)
Pt is scheduled for 07/28/15 @ 8:45am. This was the first available appointment. Thanks.

## 2015-07-18 NOTE — Telephone Encounter (Signed)
Did he get an appointment for a BP follow up?

## 2015-07-22 DIAGNOSIS — M5136 Other intervertebral disc degeneration, lumbar region: Secondary | ICD-10-CM | POA: Diagnosis not present

## 2015-07-22 DIAGNOSIS — M62830 Muscle spasm of back: Secondary | ICD-10-CM | POA: Diagnosis not present

## 2015-07-24 ENCOUNTER — Encounter: Payer: Self-pay | Admitting: *Deleted

## 2015-07-28 ENCOUNTER — Ambulatory Visit (INDEPENDENT_AMBULATORY_CARE_PROVIDER_SITE_OTHER): Payer: Medicare Other | Admitting: Family Medicine

## 2015-07-28 ENCOUNTER — Ambulatory Visit: Payer: Self-pay | Admitting: Hematology and Oncology

## 2015-07-28 ENCOUNTER — Encounter: Payer: Self-pay | Admitting: Internal Medicine

## 2015-07-28 ENCOUNTER — Ambulatory Visit: Payer: Medicare Other

## 2015-07-28 ENCOUNTER — Encounter: Payer: Self-pay | Admitting: Family Medicine

## 2015-07-28 ENCOUNTER — Telehealth: Payer: Self-pay | Admitting: Family Medicine

## 2015-07-28 ENCOUNTER — Ambulatory Visit
Admission: RE | Admit: 2015-07-28 | Discharge: 2015-07-28 | Disposition: A | Discharge status: Home / Self Care | Payer: Medicare Other | Source: Ambulatory Visit | Attending: Family Medicine | Admitting: Family Medicine

## 2015-07-28 ENCOUNTER — Inpatient Hospital Stay: Payer: Medicare Other | Attending: Hematology and Oncology | Admitting: Internal Medicine

## 2015-07-28 VITALS — BP 138/68 | HR 82 | Temp 97.9°F | Wt 253.0 lb

## 2015-07-28 VITALS — BP 149/77 | HR 66 | Temp 98.0°F | Resp 22 | Ht 69.0 in | Wt 253.0 lb

## 2015-07-28 DIAGNOSIS — R238 Other skin changes: Secondary | ICD-10-CM

## 2015-07-28 DIAGNOSIS — D61818 Other pancytopenia: Secondary | ICD-10-CM | POA: Diagnosis not present

## 2015-07-28 DIAGNOSIS — I2510 Atherosclerotic heart disease of native coronary artery without angina pectoris: Secondary | ICD-10-CM | POA: Insufficient documentation

## 2015-07-28 DIAGNOSIS — R161 Splenomegaly, not elsewhere classified: Secondary | ICD-10-CM

## 2015-07-28 DIAGNOSIS — N400 Enlarged prostate without lower urinary tract symptoms: Secondary | ICD-10-CM | POA: Diagnosis not present

## 2015-07-28 DIAGNOSIS — I10 Essential (primary) hypertension: Secondary | ICD-10-CM | POA: Diagnosis not present

## 2015-07-28 DIAGNOSIS — E119 Type 2 diabetes mellitus without complications: Secondary | ICD-10-CM | POA: Diagnosis not present

## 2015-07-28 DIAGNOSIS — R978 Other abnormal tumor markers: Secondary | ICD-10-CM | POA: Insufficient documentation

## 2015-07-28 DIAGNOSIS — Z23 Encounter for immunization: Secondary | ICD-10-CM | POA: Diagnosis not present

## 2015-07-28 DIAGNOSIS — E291 Testicular hypofunction: Secondary | ICD-10-CM | POA: Diagnosis not present

## 2015-07-28 DIAGNOSIS — G4730 Sleep apnea, unspecified: Secondary | ICD-10-CM | POA: Diagnosis not present

## 2015-07-28 DIAGNOSIS — R509 Fever, unspecified: Secondary | ICD-10-CM

## 2015-07-28 DIAGNOSIS — N3001 Acute cystitis with hematuria: Secondary | ICD-10-CM

## 2015-07-28 DIAGNOSIS — Z87891 Personal history of nicotine dependence: Secondary | ICD-10-CM | POA: Insufficient documentation

## 2015-07-28 DIAGNOSIS — Z79899 Other long term (current) drug therapy: Secondary | ICD-10-CM | POA: Insufficient documentation

## 2015-07-28 DIAGNOSIS — D510 Vitamin B12 deficiency anemia due to intrinsic factor deficiency: Secondary | ICD-10-CM

## 2015-07-28 DIAGNOSIS — R233 Spontaneous ecchymoses: Secondary | ICD-10-CM

## 2015-07-28 DIAGNOSIS — E785 Hyperlipidemia, unspecified: Secondary | ICD-10-CM | POA: Diagnosis not present

## 2015-07-28 DIAGNOSIS — R634 Abnormal weight loss: Secondary | ICD-10-CM

## 2015-07-28 DIAGNOSIS — R300 Dysuria: Secondary | ICD-10-CM | POA: Insufficient documentation

## 2015-07-28 LAB — MICROSCOPIC EXAMINATION: RENAL EPITHEL UA: NONE SEEN /HPF

## 2015-07-28 LAB — CBC WITH DIFFERENTIAL/PLATELET
BASOS PCT: 1 %
Basophils Absolute: 0 10*3/uL (ref 0–0.1)
Eosinophils Absolute: 0.2 10*3/uL (ref 0–0.7)
Eosinophils Relative: 6 %
HEMATOCRIT: 37.2 % — AB (ref 40.0–52.0)
HEMOGLOBIN: 12.4 g/dL — AB (ref 13.0–18.0)
LYMPHS ABS: 1 10*3/uL (ref 1.0–3.6)
LYMPHS PCT: 25 %
MCH: 30.1 pg (ref 26.0–34.0)
MCHC: 33.4 g/dL (ref 32.0–36.0)
MCV: 90.1 fL (ref 80.0–100.0)
MONOS PCT: 9 %
Monocytes Absolute: 0.3 10*3/uL (ref 0.2–1.0)
NEUTROS ABS: 2.2 10*3/uL (ref 1.4–6.5)
NEUTROS PCT: 59 %
Platelets: 116 10*3/uL — ABNORMAL LOW (ref 150–440)
RBC: 4.12 MIL/uL — ABNORMAL LOW (ref 4.40–5.90)
RDW: 13 % (ref 11.5–14.5)
WBC: 3.8 10*3/uL (ref 3.8–10.6)

## 2015-07-28 LAB — APTT: aPTT: 34 seconds (ref 24–36)

## 2015-07-28 LAB — LACTATE DEHYDROGENASE: LDH: 173 U/L (ref 98–192)

## 2015-07-28 LAB — RETICULOCYTES
RBC.: 4.12 MIL/uL — AB (ref 4.40–5.90)
RETIC CT PCT: 1.6 % (ref 0.4–3.1)
Retic Count, Absolute: 65.9 10*3/uL (ref 19.0–183.0)

## 2015-07-28 LAB — PROTIME-INR
INR: 1.16
PROTHROMBIN TIME: 15 s (ref 11.4–15.0)

## 2015-07-28 LAB — FOLATE: FOLATE: 15.4 ng/mL (ref >5.9)

## 2015-07-28 MED ORDER — CIPROFLOXACIN HCL 500 MG PO TABS: 500.0000 mg | ORAL_TABLET | Freq: Two times a day (BID) | ORAL | Status: DC

## 2015-07-28 NOTE — Assessment & Plan Note (Addendum)
The etiology of pancytopenia is unclear. Patient seems to be fairly the asymptomatic of the low blood counts [white count 2.9; hemoglobin 11-12; platelets 88]. I would recommend checking a CBC CMP and LDH; checking a reticulocyte count. Check folic acid; also check SPEP and serum free light chain ratio. Check a CT of the abdomen and pelvis with contrast. I also discussed that the patient might need a bone marrow biopsy for further evaluation to be done under interventional radiology if needed. However for now above blood work/scan.

## 2015-07-28 NOTE — Progress Notes (Signed)
[facility] CONSULT NOTE  Patient Care Team: [name], MD as PCP - General (Family Medicine) Lucilla Lame, MD as Consulting Physician (Gastroenterology) Cammie Sickle, MD as Consulting Physician (Hematology and Oncology)  CHIEF COMPLAINTS/PURPOSE OF CONSULTATION:  Pancytopenia  HISTORY OF PRESENTING ILLNESS:  Mason Wilson. 74 y.o. male is here because of new diagnosis of pancytopenia. Patient had routine labs approximately 2 weeks ago which showed a white count of 2.9/normal differential; hemoglobin 11.9 MCV normal; platelets of 88.   Patient denies any frequent infections denies any long-standing fevers. Denies any night sweats. He also denies any left upper quadrant pain or early satiety. Interestingly he has lost about 50 pounds in the last 1 year which he says is intentional.  Patient denies any history of alcohol abuse; denies any new drugs or any herbal medications.    However in the last 2-3 days noted to have dysuria; without any gross blood in urine. He had a fever of 102 Fahrenheit 2 days ago. He denies any back pain or chills. He has been prescribed antibiotic-not started yet.   Review of systems: Denies any unusual cough or shortness of breath. Denies any unusual swelling in the legs. He has chronic "dry skin" in the back is not new. Complete 10 point review of systems medicine is negative for mentioned above in history of present illness     MEDICAL HISTORY:  Past Medical History  Diagnosis Date  . Allergy   . Sleep apnea   . Hypogonadism in male   . Hyperlipidemia   . Hypertension   . CAD (coronary artery disease)   . Diabetes mellitus without complication   . Elevated liver enzymes   . Body mass index 39.0-39.9, adult   . Heart murmur   . Melanoma in situ of right shoulder   . Renal stones   . Chronic back pain   . Arthritis     SURGICAL HISTORY: Past Surgical History  Procedure Laterality Date  . Angioplasty  1999    3  stents  . Carpal tunnel release      x2  . Cataract extraction, bilateral    . Skin lesion excision  1998  . Tonsillectomy      age 24    SOCIAL HISTORY: Social History   Social History  . Marital Status: Married    Spouse Name: N/A  . Number of Children: N/A  . Years of Education: N/A   Occupational History  . Not on file.   Social History Main Topics  . Smoking status: Former Smoker    Types: Cigarettes    Quit date: 04/02/1970  . Smokeless tobacco: Current User    Types: Chew     Comment: 1974  . Alcohol Use: No     Comment: stopped in 1996  . Drug Use: No  . Sexual Activity:    Partners: Female   Other Topics Concern  . Not on file   Social History Narrative    FAMILY HISTORY: Family History  Problem Relation Age of Onset  . Thyroid disease Mother   . Alzheimer's disease Mother   . Heart disease Mother   . Cancer Father 61    testicular  . Heart disease Father   . Hypertension Father   . Heart attack Father     ALLERGIES:  is allergic to altace.  MEDICATIONS:  Current Outpatient Prescriptions  Medication Sig Dispense Refill  . aspirin 325 MG EC tablet Take 325  mg by mouth daily.    Marland Kitchen atenolol (TENORMIN) 50 MG tablet Take 1 tablet (50 mg total) by mouth daily. (Patient taking differently: Take 50 mg by mouth 2 (two) times daily. ) 90 tablet 4  . glipiZIDE (GLUCOTROL) 5 MG tablet Take 1 tablet (5 mg total) by mouth daily before breakfast. 90 tablet 4  . losartan (COZAAR) 100 MG tablet Take 1 tablet (100 mg total) by mouth daily. 90 tablet 4  . metFORMIN (GLUCOPHAGE) 500 MG tablet Take 2 tablets (1,000 mg total) by mouth 2 (two) times daily with a meal. 180 tablet 4  . Multiple Vitamins-Minerals (CENTRUM SILVER ADULT 50+ PO) Take 1 tablet by mouth daily.    . simvastatin (ZOCOR) 40 MG tablet Take 1 tablet (40 mg total) by mouth daily. 90 tablet 4  . ciprofloxacin (CIPRO) 500 MG tablet Take 1 tablet (500 mg total) by mouth 2 (two) times daily. (Patient  not taking: Reported on 07/28/2015) 42 tablet 0  . meloxicam (MOBIC) 15 MG tablet Take 1 tablet (15 mg total) by mouth daily. (Patient not taking: Reported on 07/28/2015) 30 tablet 3  . tiZANidine (ZANAFLEX) 4 MG tablet 1/2-1 bid prn    . traMADol (ULTRAM) 50 MG tablet 1 po bid prn for pain     No current facility-administered medications for this visit.     PHYSICAL EXAMINATION:   Filed Vitals:   07/28/15 1328  BP: 149/77  Pulse: 66  Temp: 98 F (36.7 C)  Resp: 22   Filed Weights   07/28/15 1328  Weight: 253 lb (114.76 kg)    GENERAL:alert, no distress and comfortable; he is able to sit on the exam table without any difficulty. He is accompanied by wife.  SKIN: skin color, texture, turgor are normal, no rashes or significant lesions EYES: normal, conjunctiva are pink and non-injected, sclera clear OROPHARYNX:no exudate, no erythema and lips, buccal mucosa, and tongue normal  NECK: supple, thyroid normal size, non-tender, without nodularity LYMPH:  no palpable lymphadenopathy in the cervical, axillary or inguinal LUNGS: clear to auscultation ; no wheeze or crackles;normal breathing effort HEART: regular rate & rhythm and no murmurs and no lower extremity edema ABDOMEN:abdomen soft, non-tender and normal bowel sounds; obese. No hepatosplenomegaly.  Musculoskeletal:no cyanosis of digits and no clubbing  PSYCH: alert & oriented x 3 with fluent speech NEURO: no focal motor/sensory deficits  LABORATORY DATA:  I have reviewed the data as listed   RADIOGRAPHIC STUDIES: I have personally reviewed the radiological images as listed and agreed with the findings in the report. Dg Abd 2 Views  07/28/2015   CLINICAL DATA:  Fever of unknown origin.  EXAM: ABDOMEN - 2 VIEW  COMPARISON:  None.  FINDINGS: The bowel gas pattern is normal. There is no evidence of free air. Small phlebolith is seen in the left pelvis.  IMPRESSION: No evidence of bowel obstruction or ileus.   Electronically  Signed   By: Marijo Conception, M.D.   On: 07/28/2015 14:20    ASSESSMENT & PLAN:  Other pancytopenia The etiology of pancytopenia is unclear. Patient seems to be fairly the asymptomatic of the low blood counts [white count 2.9; hemoglobin 11-12; platelets 88]. I would recommend checking a CBC CMP and LDH; checking a reticulocyte count. Check folic acid; also check SPEP and serum free light chain ratio. Check a CT of the abdomen and pelvis with contrast. I also discussed that the patient might need a bone marrow biopsy for further evaluation to be  done under interventional radiology if needed. However for now above blood work/scan.   Weight loss Patient states to have lost 50 pounds in the last 1 year. He states this is intentional. However in the context of his pancytopenia I think this needs to be further evaluated- so await on the CAT scan as discussed above.    Thank you Dr.Crissman for allowing me to participate in the care of your pleasant patient.  Please do not hesitate to contact me if any questions or concerns in the interim.   All questions were answered. The patient knows to call the clinic with any problems, questions or concerns. I spent 40 minutes counseling the patient face to face. The total time spent in the appointment was 60 minutes and more than 50% was on counseling.     Cammie Sickle, MD 07/28/2015 4:00 PM

## 2015-07-28 NOTE — Assessment & Plan Note (Addendum)
Patient states to have lost 50 pounds in the last 1 year. He states this is intentional. However in the context of his pancytopenia I think this needs to be further evaluated- so await on the CAT scan as discussed above.

## 2015-07-28 NOTE — Assessment & Plan Note (Signed)
Better on recheck. Continue to monitor. Recheck at follow up.

## 2015-07-28 NOTE — Patient Instructions (Addendum)
Anemia, Nonspecific Anemia is a condition in which the concentration of red blood cells or hemoglobin in the blood is below normal. Hemoglobin is a substance in red blood cells that carries oxygen to the tissues of the body. Anemia results in not enough oxygen reaching these tissues.  CAUSES  Common causes of anemia include:   Excessive bleeding. Bleeding may be internal or external. This includes excessive bleeding from periods (in women) or from the intestine.   Poor nutrition.   Chronic kidney, thyroid, and liver disease.  Bone marrow disorders that decrease red blood cell production.  Cancer and treatments for cancer.  HIV, AIDS, and their treatments.  Spleen problems that increase red blood cell destruction.  Blood disorders.  Excess destruction of red blood cells due to infection, medicines, and autoimmune disorders. SIGNS AND SYMPTOMS   Minor weakness.   Dizziness.   Headache.  Palpitations.   Shortness of breath, especially with exercise.   Paleness.  Cold sensitivity.  Indigestion.  Nausea.  Difficulty sleeping.  Difficulty concentrating. Symptoms may occur suddenly or they may develop slowly.  DIAGNOSIS  Additional blood tests are often needed. These help your health care provider determine the best treatment. Your health care provider will check your stool for blood and look for other causes of blood loss.  TREATMENT  Treatment varies depending on the cause of the anemia. Treatment can include:   Supplements of iron, vitamin M08, or folic acid.   Hormone medicines.   A blood transfusion. This may be needed if blood loss is severe.   Hospitalization. This may be needed if there is significant continual blood loss.   Dietary changes.  Spleen removal. HOME CARE INSTRUCTIONS Keep all follow-up appointments. It often takes many weeks to correct anemia, and having your health care provider check on your condition and your response to  treatment is very important. SEEK IMMEDIATE MEDICAL CARE IF:   You develop extreme weakness, shortness of breath, or chest pain.   You become dizzy or have trouble concentrating.  You develop heavy vaginal bleeding.   You develop a rash.   You have bloody or black, tarry stools.   You faint.   You vomit up blood.   You vomit repeatedly.   You have abdominal pain.  You have a fever or persistent symptoms for more than 2-3 days.   You have a fever and your symptoms suddenly get worse.   You are dehydrated.  MAKE SURE YOU:  Understand these instructions.  Will watch your condition.  Will get help right away if you are not doing well or get worse. Document Released: 12/02/2004 Document Revised: 06/27/2013 Document Reviewed: 04/20/2013 [facility] Patient Information 2015 [place], [place]. This information is not intended to replace advice given to you by your health care provider. Make sure you discuss any questions you have with your health care provider.  Thrombocytopenia Thrombocytopenia is a condition in which there is an abnormally small number of platelets in your blood. Platelets are also called thrombocytes. Platelets are needed for blood clotting. CAUSES Thrombocytopenia is caused by:   Decreased production of platelets. This can be caused by:  Aplastic anemia in which your bone marrow quits making blood cells.  Cancer in the bone marrow.  Use of certain medicines, including chemotherapy.  Infection in the bone marrow.  Heavy alcohol consumption.  Increased destruction of platelets. This can be caused by:  Certain immune diseases.  Use of certain drugs.  Certain blood clotting disorders.  Certain inherited disorders.  Certain bleeding disorders.  Pregnancy.  Having an enlarged spleen (hypersplenism). In hypersplenism, the spleen gathers up platelets from circulation. This means the platelets are not available to help with blood clotting.  The spleen can enlarge due to cirrhosis or other conditions. SYMPTOMS  The symptoms of thrombocytopenia are side effects of poor blood clotting. Some of these are:  Abnormal bleeding.  Nosebleeds.  Heavy menstrual periods.  Blood in the urine or stools.  Purpura. This is a purplish discoloration in the skin produced by small bleeding vessels near the surface of the skin.  Bruising.  A rash that may be petechial. This looks like pinpoint, purplish-red spots on the skin and mucous membranes. It is caused by bleeding from small blood vessels (capillaries). DIAGNOSIS  Your caregiver will make this diagnosis based on your exam and blood tests. Sometimes, a bone marrow study is done to look for the original cells (megakaryocytes) that make platelets. TREATMENT  Treatment depends on the cause of the condition.  Medicines may be given to help protect your platelets from being destroyed.  In some cases, a replacement (transfusion) of platelets may be required to stop or prevent bleeding.  Sometimes, the spleen must be surgically removed. HOME CARE INSTRUCTIONS   Check the skin and linings inside your mouth for bruising or bleeding as directed by your caregiver.  Check your sputum, urine, and stool for blood as directed by your caregiver.  Do not return to any activities that could cause bumps or bruises until your caregiver says it is okay.  Take extra care not to cut yourself when shaving or when using scissors, needles, knives, and other tools.  Take extra care not to burn yourself when ironing or cooking.  Ask your caregiver if it is okay for you to drink alcohol.  Only take over-the-counter or prescription medicines as directed by your caregiver.  Notify all your caregivers, including dentists and eye doctors, about your condition. SEEK IMMEDIATE MEDICAL CARE IF:   You develop active bleeding from anywhere in your body.  You develop unexplained bruising or bleeding.  You  have blood in your sputum, urine, or stool. MAKE SURE YOU:  Understand these instructions.  Will watch your condition.  Will get help right away if you are not doing well or get worse. Document Released: 10/25/2005 Document Revised: 01/17/2012 Document Reviewed: 08/27/2011 [facility] Patient Information 2015 [place], [place]. This information is not intended to replace advice given to you by your health care provider. Make sure you discuss any questions you have with your health care provider.   Leukopenia Leukopenia is a condition in which you have a low number of white blood cells. White blood cells help your body fight infections. The number of white blood cells in the body varies from person to person. Leukopenia is usually defined as having fewer than 4,000 white blood cells in 1 microliter of blood. There are five types of white blood cells. Two types make up most of your white blood cell count. These are neutrophils and lymphocytes. When your level of neutrophils is low, it is called neutropenia. When your lymphocytes are low, it is called lymphocytopenia. Neutropenia is the most dangerous type of leukopenia because it can lead to dangerous infections. CAUSES  Most white blood cells are made in the soft tissue inside your bones (bone marrow). Conditions that damage or suppress bone marrow are the most common causes of leukopenia. These include:  Medicine or X-ray treatments for cancer.  Serious infections.  Cancer of  the white blood cells (leukemia or myeloma).  Medicines, including antibiotics, cardiac drugs, steroids, and those used to treat rheumatoid arthritis. Leukopenia also happens when white blood cells are destroyed after leaving your bone marrow. Causes may include:  Liver disease.  Diseases of the immune system (autoimmune disease).  Vitamin B deficiencies. SIGNS AND SYMPTOMS One of the most common signs of leukopenia, especially severe neutropenia, is having a lot of  bacterial infections. Different infections have different symptoms. An infection in your lungs may cause coughing. A urinary tract infection may cause frequent urination and a burning sensation. You may also get infections of the blood, skin, rectum, throat, sinus, or ear. General signs and symptoms of leukopenia include:  Fever.  Fatigue.  Swollen glands (lymph nodes).  Painful mouth ulcers.  Gum disease. DIAGNOSIS  Your health care provider can diagnose leukopenia based on a physical exam and the results of lab tests. During a physical exam, your health care provider will feel for swollen lymph nodes and check whether your spleen is enlarged. Your spleen is an organ on the left side of your body that stores white blood cells. Tests that may be done include:  A complete blood count. This blood test counts each type of white cell.  Bone marrow aspiration. Some bone marrow is removed to be checked under a microscope.  Lymph node biopsy. Some lymph node tissue is removed to be checked under a microscope.  Other types of blood tests or imaging tests. TREATMENT  Treatment of leukopenia depends on the cause. Some common treatments include:  Antibiotics for bacterial infections.  No longer taking medicines that may cause leukopenia.  Vitamin B supplements.  Medicines to stimulate neutrophil production (hematopoietic growth factors) for neutropenia. HOME CARE INSTRUCTIONS  Preventing infection is important if you have leukopenia.  Avoid sick friends and family members.  Wash your hands often.  Do noteat uncooked or undercooked meats.  Wash fruits and vegetables.  Do not eat or drink unpasteurized dairy products.  Get regular dental care, and maintain good dental hygiene.  Keep all follow-up appointments. SEEK MEDICAL CARE IF:  You have chills or a fever.  You have signs or symptoms of infection. SEEK IMMEDIATE MEDICAL CARE IF:  You have a fever or persistent symptoms  for more than 2-3 days.  You have trouble breathing.  You have chest pain. MAKE SURE YOU:  Understand these instructions.  Will watch your condition.  Will get help right away if you are not doing well or get worse. Document Released: 10/30/2013 Document Reviewed: 10/30/2013 [facility] Patient Information 2015 [place], [place]. This information is not intended to replace advice given to you by your health care provider. Make sure you discuss any questions you have with your health care provider.    Complete Blood Count A complete blood count is a group of tests that measures several characteristics of the three types of cells in your blood. The liquid portion of your blood (plasma) is not used in these tests. Irregularities found in results from these tests can indicate different conditions, such as anemia, infections, bleeding problems, and cancers. The blood tests included in a complete blood count can be broken down into the cell types that they examine and what they measure:   White blood cells.  White blood cell count. This is a measurement of the number of white blood cells in a standard volume (concentration) in your blood sample.  White blood cell differential. This identifies the types of white blood cells  and the concentration of each in the sample of your blood. There are five different types of white blood cells. They all help you fight infection but in different ways. The differential also identifies immature white blood cells.  Red blood cells.  Red blood cell count. This is a measurement of the concentration of red blood cells in your blood sample.  Hemoglobin. This is a measurement of the amount of hemoglobin in the sample of your blood. This measurement indicates your blood's overall oxygen-carrying capacity.  Hematocrit. This is a measurement of the percentage of space that the red blood cells take up in your blood sample.  Mean corpuscular volume. This is a  measurement of the average size of your red blood cells.  Mean corpuscular hemoglobin. This is a measurement of the average amount of hemoglobin inside each of your red blood cells.  Mean corpuscular hemoglobin concentration. This is a calculation of the average concentration of hemoglobin inside each of your red blood cells in your blood sample.  Red blood cell distribution width. This is a measurement of the variation in the size of your red blood cells.  Platelets.  The platelet count. This is a measurement of the concentration of platelets in your blood sample.  Mean platelet volume. This is a measurement of the average size of the platelets in your blood sample. RESULTS It is your responsibility to obtain your test results. Ask the laboratory or department performing the test when and how you will get your results. Contact your health care provider to discuss any questions you have about your results. Results outside of normal ranges can be an indication of an illness. Examples of abnormal results and possible causes are listed as follows:   White blood cells.  An abnormally low concentration of white blood cells can be caused by certain infections and by conditions that interfere with white blood cell production that happens in the inner part of your bone (bone marrow).  An abnormally high concentration of white blood cells often indicates infections and conditions that cause inflammation. It can also be an indication of blood-related cancer.  Immature white blood cells can indicate an infection or an abnormal condition affecting your bone marrow.  Red blood cells.  An abnormally low concentration of red blood cells, hemoglobin, or hematocrit is called anemia.  An abnormally high concentration of red blood cells, hemoglobin, or hematocrit is called polycythemia. Abnormally high levels of red blood cells can indicate mild thalassemia. Thalassemia is a type of anemia that is passed  down through families (hereditary).  When your mean corpuscular volume is abnormally low, your red blood cells are smaller than normal. This can be caused by thalassemia or iron deficiency anemia. Iron deficiency anemia is a type of anemia that is the result of a deficiency of a nutrient (deficiency anemia). In this case, the nutrient is iron.  An abnormally high mean corpuscular volume means your red blood cells are larger than normal. This can indicate a deficiency anemia caused by a lack of vitamin B12. An abnormally high mean corpuscular volume also can be caused by a lot of new red blood cells in your blood. This can happen after you have suddenly lost a lot of blood.  Abnormally low mean corpuscular hemoglobin concentration can indicate conditions in which your hemoglobin is abnormally diluted inside the red cells. Examples of these conditions are iron deficiency anemia and thalassemia.  An abnormally high mean corpuscular hemoglobin concentration can indicate the presence of certain  hemolytic anemias. Hemolytic anemia is anemia that results from the abnormal breakdown of your red blood cells.  Red cell distribution width is abnormally increased in certain anemias, when new red blood cells are produced after acute blood loss, and with severe thalassemia.  Platelets.  An abnormally low concentration of platelets can be a sign of a bleeding disorder.  An abnormally high concentration of platelets can occur with iron deficiency anemia, inflammatory disorders, and cancers, or result from physical stresses, such as exercise or blood loss. However, it may also be a sign of a clotting disorder.  New platelets are larger than old platelets. An abnormally high mean platelet volume occurs with a large increase in the number of new platelets being produced by your bone marrow. This can happen after the loss of a large amount of blood or the destruction of your platelets by antibodies. An abnormally high  mean platelet volume also can occur with certain bone marrow cancers. Document Released: 11/27/2004 Document Revised: 03/11/2014 Document Reviewed: 03/08/2012 [facility] Patient Information 2015 [place], [place]. This information is not intended to replace advice given to you by your health care provider. Make sure you discuss any questions you have with your health care provider.   Bone Marrow Aspiration and Bone Biopsy Examination of the bone marrow is a valuable test to diagnose blood disorders. A bone marrow biopsy takes a sample of bone and a small amount of fluid and cells from inside the bone. A bone marrow aspiration removes only the marrow. Bone marrow aspiration and bone biopsies are used to stage different disorders of the blood, such as leukemia. Staging will help your caregiver understand how far the disease has progressed.  The tests are also useful in diagnosing:  Fever of unknown origin (FUO).  Bacterial infections and other widespread fungal infections.  Cancers that have spread (metastasized) to the bone marrow.  Diseases that are characterized by a deficiency of an enzyme (storage diseases). This includes:  Niemann-Pick disease.  Gaucher disease. PROCEDURE  Sites used to get samples include:   Back of your hip bone (posterior iliac crest).  Both aspiration and biopsy.  Front of your hip bone (anterior iliac crest).  Both aspiration and biopsy.  Breastbone (sternum).  Aspiration from your breastbone (done only in adults). This method is rarely used. When you get a hip bone aspiration:  You are placed lying on your side with the upper knee brought up and flexed with the lower leg straight.  The site is prepared, cleaned with an antiseptic scrub, and draped. This keeps the biopsy area clean.  The skin and the area down to the lining of the bone (periosteum) are made numb with a local anesthetic.  The bone marrow aspiration needle is inserted. You will feel  pressure on your bone.  Once inside the marrow cavity, a sample of bone marrow is sucked out (aspirated) for pathology slides.  The material collected for bone marrow slides is processed immediately by a technologist.  The technician selects the marrow particles to make the slides for pathology.  The marrow aspiration needle is removed. Then pressure is applied to the site with gauze until bleeding has stopped. Following an aspiration, a bone marrow biopsy may be performed as well. The technique for this is very similar. A dressing is then applied.  RISKS AND COMPLICATIONS  The main complications of a bone marrow aspiration and biopsy include infection and bleeding.  Complications are uncommon. The procedure may not be performed in patients with  bleeding tendencies.  A very rare complication from the procedure is injury to the heart during a breastbone (sternal) marrow aspiration. Only bone marrow aspirations are performed in this area.  Long-lasting pain at the site of the bone marrow aspiration and biopsy is uncommon. Your caregiver will let you know when you are to get your results and will discuss them with you. You may make an appointment with your caregiver to find out the results. Do not assume everything is normal if you have not heard from your caregiver or the medical facility. It is important for you to follow up on all of your test results. Document Released: 10/28/2004 Document Revised: 01/17/2012 Document Reviewed: 10/22/2008 [facility] Patient Information 2015 [place], [place]. This information is not intended to replace advice given to you by your health care provider. Make sure you discuss any questions you have with your health care provider.

## 2015-07-28 NOTE — Assessment & Plan Note (Signed)
Hx of BPH, potentially contributing to his symptoms. Recheck in 2 weeks if getting better.

## 2015-07-28 NOTE — Progress Notes (Signed)
BP 138/68 mmHg  Pulse 82  Temp(Src) 97.9 F (36.6 C)  Wt 253 lb (114.76 kg)  SpO2 96%   Subjective:    Patient ID: Mason Chute., male    DOB: 1941/08/19, 74 y.o.   MRN: [id number]  HPI: Mason Wilson is a 74 y.o. male  Chief Complaint  Patient presents with  . Hypertension  . urinary symptoms    foul smell, darkness and fever   URINARY SYMPTOMS Started middle of the week, fever and chills started on Friday night Dysuria: yes Urinary frequency: yes Urgency: yes Small volume voids: yes Symptom severity: severe Urinary incontinence: no Foul odor: yes Hematuria: yes Abdominal pain: no Back pain: no Suprapubic pain/pressure: yes Flank pain: no Fever:  yes Vomiting: no Status: stable Previous urinary tract infection: no Recurrent urinary tract infection: no Penile discharge: no Treatments attempted: ibuprofen and tylenol and increasing fluids   Relevant past medical, surgical, family and social history reviewed and updated as indicated. Interim medical history since our last visit reviewed. Allergies and medications reviewed and updated.  Review of Systems  Constitutional: Positive for fever, chills, diaphoresis and fatigue. Negative for activity change, appetite change and unexpected weight change.  Respiratory: Negative.   Cardiovascular: Negative.   Gastrointestinal: Negative.   Genitourinary: Positive for dysuria, urgency, frequency and hematuria. Negative for flank pain, decreased urine volume, discharge, penile swelling, scrotal swelling, enuresis, difficulty urinating, genital sores, penile pain and testicular pain.  Psychiatric/Behavioral: Negative.     Per HPI unless specifically indicated above     Objective:    BP 138/68 mmHg  Pulse 82  Temp(Src) 97.9 F (36.6 C)  Wt 253 lb (114.76 kg)  SpO2 96%  Wt Readings from Last 3 Encounters:  07/28/15 253 lb (114.76 kg)  07/08/15 254 lb (115.214 kg)  03/11/15 258 lb (117.028 kg)    Physical  Exam  Constitutional: He is oriented to person, place, and time. He appears well-developed and well-nourished. No distress.  HENT:  Head: Normocephalic and atraumatic.  Right Ear: Hearing normal.  Left Ear: Hearing normal.  Nose: Nose normal.  Eyes: Conjunctivae and lids are normal. Right eye exhibits no discharge. Left eye exhibits no discharge. No scleral icterus.  Cardiovascular: Normal rate, regular rhythm, normal heart sounds and intact distal pulses.  Exam reveals no gallop and no friction rub.   No murmur heard. Pulmonary/Chest: Effort normal and breath sounds normal. No respiratory distress. He has no wheezes. He has no rales. He exhibits no tenderness.  Abdominal: Soft. Bowel sounds are normal. He exhibits no distension and no mass. There is no hepatosplenomegaly, splenomegaly or hepatomegaly. There is no tenderness. There is CVA tenderness. There is no rebound and no guarding.  Musculoskeletal: Normal range of motion.  Neurological: He is alert and oriented to person, place, and time.  Skin: Skin is intact. No rash noted. No erythema. No pallor.  Psychiatric: He has a normal mood and affect. His speech is normal and behavior is normal. Judgment and thought content normal. Cognition and memory are normal.  Nursing note and vitals reviewed.   Results for orders placed or performed in visit on 07/28/15  Microscopic Examination  Result Value Ref Range   WBC, UA >30 (A) 0 -  5 /hpf   RBC, UA 3-10 (A) 0 -  2 /hpf   Epithelial Cells (non renal) 0-10 0 - 10 /hpf   Renal Epithel, UA None seen None seen /hpf   Bacteria, UA Moderate (A) None  seen/Few  UA/M w/rflx Culture, Routine  Result Value Ref Range   Specific Gravity, UA 1.020 1.005 - 1.030   pH, UA 6.0 5.0 - 7.5   Color, UA Yellow Yellow   Appearance Ur Cloudy (A) Clear   Leukocytes, UA 3+ (A) Negative   Protein, UA 1+ (A) Negative/Trace   Glucose, UA Negative Negative   Ketones, UA Negative Negative   RBC, UA 3+ (A)  Negative   Bilirubin, UA Negative Negative   Urobilinogen, Ur 4.0 (H) 0.2 - 1.0 mg/dL   Nitrite, UA Positive (A) Negative   Microscopic Examination See below:    Urinalysis Reflex Comment       Assessment & Plan:   Problem List Items Addressed This Visit      Cardiovascular and Mediastinum   Hypertension    Better on recheck. Continue to monitor. Recheck at follow up.         Genitourinary   BPH (benign prostatic hyperplasia)    Hx of BPH, potentially contributing to his symptoms. Recheck in 2 weeks if getting better.        Other Visit Diagnoses    Acute cystitis with hematuria    -  Primary    Hx of BPH and stones. UA very dirty with signs of UTI. If no stone will check back in in 2 weeks. If + stone- will get him in with urology. Otherwise monitor.    Relevant Orders    DG Abd 2 Views    Fever, unspecified fever cause        Likely due to UTI, continue antipyretics for comfort    Relevant Orders    UA/M w/rflx Culture, Routine (Completed)    DG Abd 2 Views    Immunization due        Flu shot given today.     Relevant Orders    Flu Vaccine QUAD 36+ mos PF IM (Fluarix & Fluzone Quad PF) (Completed)        Follow up plan: Return in about 2 weeks (around 08/11/2015).

## 2015-07-28 NOTE — Telephone Encounter (Signed)
Called and LMOM- normal x-ray. OK to tell him if he calls back.

## 2015-07-29 LAB — MULTIPLE MYELOMA PANEL, SERUM
ALBUMIN SERPL ELPH-MCNC: 3.5 g/dL (ref 2.9–4.4)
ALPHA 1: 0.3 g/dL (ref 0.0–0.4)
Albumin/Glob SerPl: 1.2 (ref 0.7–1.7)
Alpha2 Glob SerPl Elph-Mcnc: 0.7 g/dL (ref 0.4–1.0)
B-Globulin SerPl Elph-Mcnc: 1.1 g/dL (ref 0.7–1.3)
GLOBULIN, TOTAL: 3 g/dL (ref 2.2–3.9)
Gamma Glob SerPl Elph-Mcnc: 1 g/dL (ref 0.4–1.8)
IGA: 206 mg/dL (ref 61–437)
IGM, SERUM: 135 mg/dL (ref 15–143)
IgG (Immunoglobin G), Serum: 1002 mg/dL (ref 700–1600)
TOTAL PROTEIN ELP: 6.5 g/dL (ref 6.0–8.5)

## 2015-07-29 LAB — VITAMIN B12: VITAMIN B 12: 488 pg/mL (ref 180–914)

## 2015-07-29 LAB — HAPTOGLOBIN: HAPTOGLOBIN: 133 mg/dL (ref 34–200)

## 2015-07-29 LAB — KAPPA/LAMBDA LIGHT CHAINS
KAPPA, LAMDA LIGHT CHAIN RATIO: 0.95 (ref 0.26–1.65)
Kappa free light chain: 23.98 mg/L — ABNORMAL HIGH (ref 3.30–19.40)
Lambda free light chains: 25.31 mg/L (ref 5.71–26.30)

## 2015-07-31 LAB — URINE CULTURE, REFLEX

## 2015-08-01 ENCOUNTER — Ambulatory Visit
Admission: RE | Admit: 2015-08-01 | Discharge: 2015-08-01 | Disposition: A | Discharge status: Home / Self Care | Payer: Medicare Other | Source: Ambulatory Visit | Attending: Internal Medicine | Admitting: Internal Medicine

## 2015-08-01 DIAGNOSIS — J90 Pleural effusion, not elsewhere classified: Secondary | ICD-10-CM | POA: Insufficient documentation

## 2015-08-01 DIAGNOSIS — R161 Splenomegaly, not elsewhere classified: Secondary | ICD-10-CM | POA: Diagnosis not present

## 2015-08-01 DIAGNOSIS — K766 Portal hypertension: Secondary | ICD-10-CM | POA: Insufficient documentation

## 2015-08-01 DIAGNOSIS — K7460 Unspecified cirrhosis of liver: Secondary | ICD-10-CM | POA: Diagnosis not present

## 2015-08-01 DIAGNOSIS — N400 Enlarged prostate without lower urinary tract symptoms: Secondary | ICD-10-CM | POA: Insufficient documentation

## 2015-08-01 DIAGNOSIS — R188 Other ascites: Secondary | ICD-10-CM | POA: Diagnosis not present

## 2015-08-01 LAB — UA/M W/RFLX CULTURE, ROUTINE

## 2015-08-01 MED ORDER — IOHEXOL 350 MG/ML SOLN
100.0000 mL | Freq: Once | INTRAVENOUS | Status: AC | PRN
  Administered 2015-08-01: 100 mL via INTRAVENOUS

## 2015-08-11 ENCOUNTER — Encounter: Payer: Self-pay | Admitting: *Deleted

## 2015-08-11 ENCOUNTER — Ambulatory Visit: Payer: Medicare Other | Admitting: Family Medicine

## 2015-08-11 ENCOUNTER — Inpatient Hospital Stay: Payer: Medicare Other | Attending: Internal Medicine | Admitting: Internal Medicine

## 2015-08-11 VITALS — BP 154/72 | HR 71 | Temp 97.3°F | Resp 18 | Ht 69.0 in | Wt 253.5 lb

## 2015-08-11 DIAGNOSIS — K766 Portal hypertension: Secondary | ICD-10-CM | POA: Diagnosis not present

## 2015-08-11 DIAGNOSIS — D61818 Other pancytopenia: Secondary | ICD-10-CM | POA: Diagnosis not present

## 2015-08-11 DIAGNOSIS — R161 Splenomegaly, not elsewhere classified: Secondary | ICD-10-CM | POA: Diagnosis not present

## 2015-08-11 DIAGNOSIS — Z7982 Long term (current) use of aspirin: Secondary | ICD-10-CM | POA: Insufficient documentation

## 2015-08-11 DIAGNOSIS — K7460 Unspecified cirrhosis of liver: Secondary | ICD-10-CM | POA: Diagnosis not present

## 2015-08-11 DIAGNOSIS — K7469 Other cirrhosis of liver: Secondary | ICD-10-CM

## 2015-08-11 DIAGNOSIS — M7989 Other specified soft tissue disorders: Secondary | ICD-10-CM | POA: Diagnosis not present

## 2015-08-11 DIAGNOSIS — Z79899 Other long term (current) drug therapy: Secondary | ICD-10-CM | POA: Diagnosis not present

## 2015-08-11 NOTE — Progress Notes (Signed)
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   In the interim patient admits that his urinary tract infections are improved. He still on antibiotics.  His appetite is fair. He has intermittent leg swelling. This is not any worse. He denies any nosebleeds or gum bleeding.   REVIEW OF SYSTEMS:  A complete 10 point review of system is done which is negative except mentioned above in history of present illness  I have reviewed the past medical history, past surgical history, social history and family history with the patient and they are unchanged from previous note unless stated above.  ALLERGIES:  is allergic to altace.  MEDICATIONS:  Current Outpatient Prescriptions  Medication Sig Dispense Refill  . aspirin 325 MG EC tablet Take 325 mg by mouth daily.    Marland Kitchen atenolol (TENORMIN) 50 MG tablet Take 1 tablet (50 mg total) by mouth daily. (Patient taking differently: Take 50 mg by mouth 2 (two) times daily. ) 90 tablet 4  . ciprofloxacin (CIPRO) 500 MG tablet Take 1 tablet (500 mg total) by mouth 2 (two) times daily. 42 tablet 0  . glipiZIDE (GLUCOTROL) 5 MG tablet Take 1 tablet (5 mg total) by mouth daily before breakfast. 90 tablet 4  . losartan (COZAAR) 100 MG tablet Take 1 tablet (100 mg total) by mouth daily. 90 tablet 4  . meloxicam (MOBIC) 15 MG tablet Take 1 tablet (15 mg total) by mouth daily. 30 tablet 3  . metFORMIN (GLUCOPHAGE) 500 MG tablet Take 2 tablets (1,000 mg total) by mouth 2 (two) times daily with a  meal. 180 tablet 4  . Multiple Vitamins-Minerals (CENTRUM SILVER ADULT 50+ PO) Take 1 tablet by mouth daily.    . simvastatin (ZOCOR) 40 MG tablet Take 1 tablet (40 mg total) by mouth daily. 90 tablet 4  . tiZANidine (ZANAFLEX) 4 MG tablet 1/2-1 bid prn    . traMADol (ULTRAM) 50 MG tablet 1 po bid prn for pain    . triamcinolone cream (KENALOG) 0.1 % Apply 1 application topically daily.  1   No current facility-administered medications for this visit.    PHYSICAL EXAMINATION:   BP 154/72 mmHg  Pulse 71  Temp(Src) 97.3 F (36.3 C)  Resp 18  Ht $R'5\' 9"'Ne$  (1.753 m)  Wt 253 lb 8.5 oz (115.001 kg)  BMI 37.42 kg/m2  SpO2 98%  Filed Weights   08/11/15 1407  Weight: 253 lb 8.5 oz (115.001 kg)    GENERAL: Well-nourished well-developed; Alert, no distress and comfortable.   Accompanied by his wife. EYES: no pallor or icterus OROPHARYNX: no thrush or ulceration; good dentition  NECK: supple, no masses felt LYMPH:  no palpable lymphadenopathy in the cervical, axillary or inguinal regions LUNGS: clear to auscultation and  No wheeze or crackles HEART/CVS: regular rate & rhythm and no murmurs; No lower extremity edema ABDOMEN:abdomen soft, non-tender and normal bowel sounds Musculoskeletal:no cyanosis of digits and no clubbing  PSYCH: alert & oriented x 3 with fluent speech NEURO: no focal motor/sensory deficits SKIN:  no rashes or significant  lesions   LABORATORY DATA:  I have reviewed the data as listed    Component Value Date/Time   NA 143 07/08/2015 1357   K 3.9 07/08/2015 1357   CL 104 07/08/2015 1357   CO2 21 07/08/2015 1357   GLUCOSE 96 07/08/2015 1357   BUN 14 07/08/2015 1357   CREATININE 0.59* 07/08/2015 1357   CALCIUM 8.7 07/08/2015 1357   PROT 6.5 07/08/2015 1357   AST 37 07/08/2015 1357   ALT 38 07/08/2015 1357   ALKPHOS 107 07/08/2015 1357   BILITOT 0.5 07/08/2015 1357   GFRNONAA 101 07/08/2015 1357   GFRAA 117 07/08/2015 1357    No results found for: SPEP,  UPEP  Lab Results  Component Value Date   WBC 3.8 07/28/2015   NEUTROABS 2.2 07/28/2015   HGB 12.4* 07/28/2015   HCT 37.2* 07/28/2015   MCV 90.1 07/28/2015   PLT 116* 07/28/2015      Chemistry      Component Value Date/Time   NA 143 07/08/2015 1357   K 3.9 07/08/2015 1357   CL 104 07/08/2015 1357   CO2 21 07/08/2015 1357   BUN 14 07/08/2015 1357   CREATININE 0.59* 07/08/2015 1357      Component Value Date/Time   CALCIUM 8.7 07/08/2015 1357   ALKPHOS 107 07/08/2015 1357   AST 37 07/08/2015 1357   ALT 38 07/08/2015 1357   BILITOT 0.5 07/08/2015 1357       RADIOGRAPHIC STUDIES: I have personally reviewed the radiological images as listed and agreed with the findings in the report. No results found.   ASSESSMENT & PLAN:   # Pancytopenia- most recent white count 3.8 hemoglobin 12.4 platelets of 116- this is likely related to cirrhosis/portal hypertension/splenomegaly. Extensive workup including monoclonal workup; S96 folic acid; LDH within normal limits. I do not recommend a bone marrow biopsy at this time.  # Cirrhosis/portal hypertension- recommend GI evaluation/a referral. CT scan- images reviewed; no evidence of any lesions concerning for [place]. Patient will need surveillance AFP/ultrasound of the liver every 6-12 months.  # Patient follow-up in 6 months with labs/AFP.   No orders of the defined types were placed in this encounter.   All questions were answered. The patient knows to call the clinic with any problems, questions or concerns. No barriers to learning was detected. & I spent 15 minutes counseling the patient face to face. The total time spent in the appointment was 30 minutes and more than 50% was on counseling and review of test results     Cammie Sickle, MD 08/11/2015 2:11 PM

## 2015-08-13 ENCOUNTER — Ambulatory Visit (INDEPENDENT_AMBULATORY_CARE_PROVIDER_SITE_OTHER): Payer: Medicare Other | Admitting: Family Medicine

## 2015-08-13 ENCOUNTER — Encounter: Payer: Self-pay | Admitting: Family Medicine

## 2015-08-13 VITALS — BP 134/70 | HR 73 | Temp 97.4°F | Wt 249.0 lb

## 2015-08-13 DIAGNOSIS — I10 Essential (primary) hypertension: Secondary | ICD-10-CM | POA: Diagnosis not present

## 2015-08-13 DIAGNOSIS — R82998 Other abnormal findings in urine: Secondary | ICD-10-CM

## 2015-08-13 DIAGNOSIS — K7469 Other cirrhosis of liver: Secondary | ICD-10-CM

## 2015-08-13 DIAGNOSIS — I2510 Atherosclerotic heart disease of native coronary artery without angina pectoris: Secondary | ICD-10-CM

## 2015-08-13 DIAGNOSIS — R8299 Other abnormal findings in urine: Secondary | ICD-10-CM

## 2015-08-13 DIAGNOSIS — N400 Enlarged prostate without lower urinary tract symptoms: Secondary | ICD-10-CM | POA: Diagnosis not present

## 2015-08-13 DIAGNOSIS — I129 Hypertensive chronic kidney disease with stage 1 through stage 4 chronic kidney disease, or unspecified chronic kidney disease: Secondary | ICD-10-CM

## 2015-08-13 LAB — MICROSCOPIC EXAMINATION: RENAL EPITHEL UA: NONE SEEN /HPF

## 2015-08-13 LAB — UA/M W/RFLX CULTURE, ROUTINE
BILIRUBIN UA: NEGATIVE
Glucose, UA: NEGATIVE
Ketones, UA: NEGATIVE
LEUKOCYTES UA: NEGATIVE
Nitrite, UA: NEGATIVE
PH UA: 5 (ref 5.0–7.5)
RBC UA: NEGATIVE
Specific Gravity, UA: 1.03 (ref 1.005–1.030)
Urobilinogen, Ur: 0.2 mg/dL (ref 0.2–1.0)

## 2015-08-13 LAB — MICROALBUMIN, URINE WAIVED
Creatinine, Urine Waived: 100 mg/dL (ref 10–300)
MICROALB, UR WAIVED: 80 mg/L — AB (ref 0–19)

## 2015-08-13 NOTE — Assessment & Plan Note (Signed)
Doing much better. UTI getting treated. Continue cipro for another week. Continue flomax. Continue to monitor.

## 2015-08-13 NOTE — Progress Notes (Signed)
BP 134/70 mmHg  Pulse 73  Temp(Src) 97.4 F (36.3 C)  Wt 249 lb (112.946 kg)  SpO2 97%   Subjective:    Patient ID: Mason Wilson., male    DOB: Jun 04, 1941, 74 y.o.   MRN: [id number]  HPI: Mason Wilson is a 74 y.o. male  Chief Complaint  Patient presents with  . 2 week follow up visit  . Hepatic Disease    cirrhosis, patient was diagnosed with this by oncology. He states that he needs to be sent to a specialist, the oncologist is requesting that his primary take care of this   BPH BPH status: controlled Satisfied with current treatment?: yes Medication side effects: no Medication compliance: excellent compliance Duration: 2 weeks Nocturia: no Urinary frequency: yes Incomplete voiding: no Urgency: no Weak urinary stream: no Straining to start stream: no Dysuria: no Onset: gradual Severity: moderate  Quit alcohol in 1996, drank heavily at certain times previously. Not an alcoholic, but did binge drink previously. No history of IV drug use.  Cirrhosis Method of detection: CT scan at oncology Duration of LFT abnormality: unknown Previous LFT evaluation: no History of hepatitis: no Obesity: yes History of STDs: yes Tatoos: no History of  IV drug use: no Blood tranfusion: no  Abdominal pain: no RUQ pain: no Nausea/vomiting: no Jaundice: no Fatigue: yes Fevers: no- only with UTI Myalgias: no Pruritus: yes Dark urine: yes- occasionally  Relevant past medical, surgical, family and social history reviewed and updated as indicated. Interim medical history since our last visit reviewed. Allergies and medications reviewed and updated.  Review of Systems  Constitutional: Negative.   Respiratory: Negative.   Cardiovascular: Negative.   Gastrointestinal: Negative.   Genitourinary: Negative.   Musculoskeletal: Negative.   Psychiatric/Behavioral: Negative.     Per HPI unless specifically indicated above     Objective:    BP 134/70 mmHg  Pulse 73   Temp(Src) 97.4 F (36.3 C)  Wt 249 lb (112.946 kg)  SpO2 97%  Wt Readings from Last 3 Encounters:  08/13/15 249 lb (112.946 kg)  08/11/15 253 lb 8.5 oz (115.001 kg)  07/28/15 253 lb (114.76 kg)    Physical Exam  Constitutional: He is oriented to person, place, and time. He appears well-developed and well-nourished. No distress.  HENT:  Head: Normocephalic and atraumatic.  Right Ear: Hearing normal.  Left Ear: Hearing normal.  Nose: Nose normal.  Eyes: Conjunctivae and lids are normal. Right eye exhibits no discharge. Left eye exhibits no discharge. No scleral icterus.  Cardiovascular: Normal rate, regular rhythm, normal heart sounds and intact distal pulses.  Exam reveals no gallop and no friction rub.   No murmur heard. Pulmonary/Chest: Effort normal and breath sounds normal. No respiratory distress. He has no wheezes. He has no rales. He exhibits no tenderness.  Abdominal: Soft. Bowel sounds are normal. He exhibits no distension and no mass. There is no tenderness. There is no rebound and no guarding.  Musculoskeletal: Normal range of motion.  Neurological: He is alert and oriented to person, place, and time.  Skin: Skin is warm, dry and intact. No rash noted. No erythema. No pallor.  Psychiatric: He has a normal mood and affect. His speech is normal and behavior is normal. Judgment and thought content normal. Cognition and memory are normal.  Nursing note and vitals reviewed.   Results for orders placed or performed in visit on 07/28/15  Vitamin B12  Result Value Ref Range   Vitamin B-12 488 180 -  914 pg/mL  Folate  Result Value Ref Range   Folate 15.4 >5.9 ng/mL  Lactate dehydrogenase  Result Value Ref Range   LDH 173 98 - 192 U/L  Haptoglobin  Result Value Ref Range   Haptoglobin 133 34 - 200 mg/dL  CBC with Differential  Result Value Ref Range   WBC 3.8 3.8 - 10.6 K/uL   RBC 4.12 (L) 4.40 - 5.90 MIL/uL   Hemoglobin 12.4 (L) 13.0 - 18.0 g/dL   HCT 37.2 (L) 40.0 -  52.0 %   MCV 90.1 80.0 - 100.0 fL   MCH 30.1 26.0 - 34.0 pg   MCHC 33.4 32.0 - 36.0 g/dL   RDW 13.0 11.5 - 14.5 %   Platelets 116 (L) 150 - 440 K/uL   Neutrophils Relative % 59 %   Neutro Abs 2.2 1.4 - 6.5 K/uL   Lymphocytes Relative 25 %   Lymphs Abs 1.0 1.0 - 3.6 K/uL   Monocytes Relative 9 %   Monocytes Absolute 0.3 0.2 - 1.0 K/uL   Eosinophils Relative 6 %   Eosinophils Absolute 0.2 0 - 0.7 K/uL   Basophils Relative 1 %   Basophils Absolute 0.0 0 - 0.1 K/uL   Smear Review MANUAL DIFF PERFORMED   Protime-INR  Result Value Ref Range   Prothrombin Time 15.0 11.4 - 15.0 seconds   INR 1.16   APTT  Result Value Ref Range   aPTT 34 24 - 36 seconds  Reticulocytes  Result Value Ref Range   Retic Ct Pct 1.6 0.4 - 3.1 %   RBC. 4.12 (L) 4.40 - 5.90 MIL/uL   Retic Count, Manual 65.9 19.0 - 183.0 K/uL  Multiple myeloma panel, serum (IFE and PE, serum)  Result Value Ref Range   IgG (Immunoglobin G), Serum 1002 700 - 1600 mg/dL   IgA 206 61 - 437 mg/dL   IgM, Serum 135 15 - 143 mg/dL   Total Protein ELP 6.5 6.0 - 8.5 g/dL   Albumin SerPl Elph-Mcnc 3.5 2.9 - 4.4 g/dL   Alpha 1 0.3 0.0 - 0.4 g/dL   Alpha2 Glob SerPl Elph-Mcnc 0.7 0.4 - 1.0 g/dL   B-Globulin SerPl Elph-Mcnc 1.1 0.7 - 1.3 g/dL   Gamma Glob SerPl Elph-Mcnc 1.0 0.4 - 1.8 g/dL   M Protein SerPl Elph-Mcnc Not Observed Not Observed g/dL   Globulin, Total 3.0 2.2 - 3.9 g/dL   Albumin/Glob SerPl 1.2 0.7 - 1.7   IFE 1 Comment    Please Note Comment   Kappa/lambda light chains  Result Value Ref Range   Kappa free light chain 23.98 (H) 3.30 - 19.40 mg/L   Lamda free light chains 25.31 5.71 - 26.30 mg/L   Kappa, lamda light chain ratio 0.95 0.26 - 1.65      Assessment & Plan:   Problem List Items Addressed This Visit      Digestive   Cirrhosis (HCC) - Primary    Checking CMP and Hepatitis panel. Referral to GI made today. Encouraged patient to keep appointment.       Relevant Orders   Ambulatory referral to  Gastroenterology   Microalbumin, Urine Waived   UA/M w/rflx Culture, Routine   Comprehensive metabolic panel   Hepatitis, Acute   Hepatitis B Surface AntiGEN     Genitourinary   Benign hypertensive renal disease    Checking microalbumin and CMP today. BP under fair control at this time. Continue to monitor.       BPH (benign prostatic hyperplasia)  Doing much better. UTI getting treated. Continue cipro for another week. Continue flomax. Continue to monitor.        Other Visit Diagnoses    Dark urine        Relevant Orders    Microalbumin, Urine Waived    UA/M w/rflx Culture, Routine        Follow up plan: No Follow-up on file.

## 2015-08-13 NOTE — Assessment & Plan Note (Signed)
Checking CMP and Hepatitis panel. Referral to GI made today. Encouraged patient to keep appointment.

## 2015-08-13 NOTE — Assessment & Plan Note (Signed)
Checking microalbumin and CMP today. BP under fair control at this time. Continue to monitor.

## 2015-08-14 ENCOUNTER — Encounter: Payer: Self-pay | Admitting: Family Medicine

## 2015-08-14 LAB — COMPREHENSIVE METABOLIC PANEL
A/G RATIO: 1.5 (ref 1.1–2.5)
ALT: 58 IU/L — AB (ref 0–44)
AST: 52 IU/L — ABNORMAL HIGH (ref 0–40)
Albumin: 4 g/dL (ref 3.5–4.8)
Alkaline Phosphatase: 119 IU/L — ABNORMAL HIGH (ref 39–117)
BUN/Creatinine Ratio: 24 — ABNORMAL HIGH (ref 10–22)
BUN: 18 mg/dL (ref 8–27)
Bilirubin Total: 0.7 mg/dL (ref 0.0–1.2)
CALCIUM: 9.3 mg/dL (ref 8.6–10.2)
CO2: 23 mmol/L (ref 18–29)
Chloride: 105 mmol/L (ref 97–108)
Creatinine, Ser: 0.74 mg/dL — ABNORMAL LOW (ref 0.76–1.27)
GFR, EST AFRICAN AMERICAN: 107 mL/min/{1.73_m2} (ref >59)
GFR, EST NON AFRICAN AMERICAN: 92 mL/min/{1.73_m2} (ref >59)
Globulin, Total: 2.7 g/dL (ref 1.5–4.5)
Glucose: 90 mg/dL (ref 65–99)
POTASSIUM: 4.2 mmol/L (ref 3.5–5.2)
Sodium: 145 mmol/L — ABNORMAL HIGH (ref 134–144)
TOTAL PROTEIN: 6.7 g/dL (ref 6.0–8.5)

## 2015-08-14 LAB — HEPATITIS PANEL, ACUTE
HEP B C IGM: NEGATIVE
Hep A IgM: NEGATIVE
Hepatitis B Surface Ag: NEGATIVE

## 2015-09-04 ENCOUNTER — Encounter (INDEPENDENT_AMBULATORY_CARE_PROVIDER_SITE_OTHER): Payer: Self-pay

## 2015-09-04 ENCOUNTER — Encounter: Payer: Self-pay | Admitting: *Deleted

## 2015-09-04 ENCOUNTER — Ambulatory Visit (INDEPENDENT_AMBULATORY_CARE_PROVIDER_SITE_OTHER): Payer: Medicare Other | Admitting: Gastroenterology

## 2015-09-04 ENCOUNTER — Other Ambulatory Visit: Payer: Self-pay

## 2015-09-04 ENCOUNTER — Encounter: Payer: Self-pay | Admitting: Gastroenterology

## 2015-09-04 VITALS — BP 146/71 | HR 71 | Temp 98.4°F | Ht 69.0 in | Wt 251.0 lb

## 2015-09-04 DIAGNOSIS — K7469 Other cirrhosis of liver: Secondary | ICD-10-CM | POA: Diagnosis not present

## 2015-09-04 DIAGNOSIS — I2510 Atherosclerotic heart disease of native coronary artery without angina pectoris: Secondary | ICD-10-CM | POA: Diagnosis not present

## 2015-09-04 NOTE — Progress Notes (Signed)
Gastroenterology Consultation  Referring Provider:     [name], MD Primary Care Physician:  Golden Pop, MD Primary Gastroenterologist:  Dr. Souffrant Norris     Reason for Consultation:     Increased liver enzymes with cirrhosis        HPI:   Mason Gentleman. is a 74 y.o. y/o male referred for consultation & management of increased liver enzymes with cirrhosis by Dr. Golden Pop, MD.  This patient comes today with a history of cirrhosis on a CT scan. The patient reports that he had weight much more than he presently weighs and has diabetes. The patient had normal liver enzymes back in August and September. The patient then reports that he had some back pain and was taking anti-inflammatory medication in excess amounts. He reports that then his liver enzymes were abnormal. The patient denies any alcohol abuse. He also denies ever being told that he had abnormal liver tests in the past. There is no stricture of any IV drug use. The labs show him to have a negative hepatitis panel. The patient's meld score was 8.  Past Medical History  Diagnosis Date  . Allergy   . Sleep apnea   . Hypogonadism in male   . Hyperlipidemia   . Hypertension   . CAD (coronary artery disease)   . Diabetes mellitus without complication ([place])   . Elevated liver enzymes   . Body mass index 39.0-39.9, adult   . Heart murmur   . Melanoma in situ of right shoulder ([place])   . Renal stones   . Chronic back pain   . Arthritis   . Melanoma ([place])     melanoma     Past Surgical History  Procedure Laterality Date  . Angioplasty  1999    3 stents  . Carpal tunnel release      x2  . Cataract extraction, bilateral    . Skin lesion excision  1998  . Tonsillectomy      age 56    Prior to Admission medications   Medication Sig Start Date End Date Taking? Authorizing Provider  aspirin 325 MG EC tablet Take 325 mg by mouth daily.   Yes Historical Provider, MD  atenolol (TENORMIN) 50 MG tablet Take 1 tablet  (50 mg total) by mouth daily. Patient taking differently: Take 50 mg by mouth 2 (two) times daily.  07/08/15  Yes [name], MD  glipiZIDE (GLUCOTROL) 5 MG tablet Take 1 tablet (5 mg total) by mouth daily before breakfast. 07/08/15  Yes [name], MD  losartan (COZAAR) 100 MG tablet Take 1 tablet (100 mg total) by mouth daily. 07/08/15  Yes [name], MD  metFORMIN (GLUCOPHAGE) 500 MG tablet Take 2 tablets (1,000 mg total) by mouth 2 (two) times daily with a meal. 07/08/15  Yes [name], MD  simvastatin (ZOCOR) 40 MG tablet Take 1 tablet (40 mg total) by mouth daily. 07/08/15  Yes [name], MD  triamcinolone cream (KENALOG) 0.1 % Apply 1 application topically daily. 07/28/15  Yes Historical Provider, MD  ciprofloxacin (CIPRO) 500 MG tablet Take 1 tablet (500 mg total) by mouth 2 (two) times daily. Patient not taking: Reported on 09/04/2015 07/28/15   Megan P Johnson, DO  meloxicam (MOBIC) 15 MG tablet Take 1 tablet (15 mg total) by mouth daily. Patient not taking: Reported on 09/04/2015 07/08/15   [name], MD  Multiple Vitamins-Minerals (CENTRUM SILVER ADULT 50+ PO) Take  1 tablet by mouth daily.    Historical Provider, MD  tiZANidine (ZANAFLEX) 4 MG tablet 1/2-1 bid prn 07/22/15   Historical Provider, MD  traMADol (ULTRAM) 50 MG tablet 1 po bid prn for pain 07/22/15   Historical Provider, MD    Family History  Problem Relation Age of Onset  . Thyroid disease Mother   . Alzheimer's disease Mother   . Heart disease Mother   . Cancer Father 62    testicular  . Heart disease Father   . Hypertension Father   . Heart attack Father      Social History  Substance Use Topics  . Smoking status: Former Smoker    Types: Cigarettes    Quit date: 04/02/1970  . Smokeless tobacco: Current User    Types: Chew     Comment: 1973  . Alcohol Use: No     Comment: stopped in 1996    Allergies as of 09/04/2015 - Review Complete 09/04/2015  Allergen Reaction Noted  .  Altace [ramipril] Cough 04/03/2015    Review of Systems:    All systems reviewed and negative except where noted in HPI.   Physical Exam:  BP 146/71 mmHg  Pulse 71  Temp(Src) 98.4 F (36.9 C) (Oral)  Ht 5\' 9"  (1.753 m)  Wt 251 lb (113.853 kg)  BMI 37.05 kg/m2 No LMP for male patient. Psych:  Alert and cooperative. Normal mood and affect. General:   Alert,  Well-developed, well-nourished, pleasant and cooperative in NAD Head:  Normocephalic and atraumatic. Eyes:  Sclera clear, no icterus.   Conjunctiva pink. Ears:  Normal auditory acuity. Nose:  No deformity, discharge, or lesions. Mouth:  No deformity or lesions,oropharynx pink & moist. Neck:  Supple; no masses or thyromegaly. Lungs:  Respirations even and unlabored.  Clear throughout to auscultation.   No wheezes, crackles, or rhonchi. No acute distress. Heart:  Regular rate and rhythm; no murmurs, clicks, rubs, or gallops. Abdomen:  Normal bowel sounds.  No bruits.  Soft, non-tender and non-distended without masses, hepatosplenomegaly or hernias noted.  No guarding or rebound tenderness.  Negative Carnett sign.   Rectal:  Deferred.  Msk:  Symmetrical without gross deformities.  Good, equal movement & strength bilaterally. Pulses:  Normal pulses noted. Extremities:  No clubbing or edema.  No cyanosis. Neurologic:  Alert and oriented x3;  grossly normal neurologically. Skin:  Intact without significant lesions or rashes.  No jaundice. Lymph Nodes:  No significant cervical adenopathy. Psych:  Alert and cooperative. Normal mood and affect.  Imaging Studies: No results found.  Assessment and Plan:   Mason Wilson. is a 74 y.o. y/o male was a new finding of cirrhosis on a imaging exam. The patient likely has cirrhosis from fatty liver disease with the patient being obese for many years. He also has diabetes. The patient will have his blood sent off for other possible causes of abnormal liver enzymes. The patient will also be  set up for a upper endoscopy to look for esophageal varices. The patient will follow-up in one month's time to review the findings.   Note: This dictation was prepared with Dragon dictation along with smaller phrase technology. Any transcriptional errors that result from this process are unintentional.

## 2015-09-05 ENCOUNTER — Encounter: Payer: Self-pay | Admitting: *Deleted

## 2015-09-05 LAB — ALPHA-1-ANTITRYPSIN: A-1 Antitrypsin: 152 mg/dL (ref 90–200)

## 2015-09-05 LAB — EXTRACTABLE NUCLEAR ANTIGEN ANTIBODY
DSDNA AB: 161 [IU]/mL — AB (ref 0–9)
ENA RNP Ab: <0.2 AI (ref 0.0–0.9)
ENA SSA (RO) Ab: <0.2 AI (ref 0.0–0.9)

## 2015-09-05 LAB — CERULOPLASMIN: Ceruloplasmin: 30.8 mg/dL (ref 16.0–31.0)

## 2015-09-05 LAB — MITOCHONDRIAL ANTIBODIES: Mitochondrial Ab: 4.9 Units (ref 0.0–20.0)

## 2015-09-05 LAB — ANTI-SMOOTH MUSCLE ANTIBODY, IGG: SMOOTH MUSCLE AB: 10 U (ref 0–19)

## 2015-09-08 ENCOUNTER — Ambulatory Visit
Admission: RE | Admit: 2015-09-08 | Discharge: 2015-09-08 | Disposition: A | Discharge status: Home / Self Care | Payer: Medicare Other | Source: Ambulatory Visit | Attending: Gastroenterology | Admitting: Gastroenterology

## 2015-09-08 ENCOUNTER — Ambulatory Visit: Payer: Medicare Other | Admitting: Anesthesiology

## 2015-09-08 ENCOUNTER — Other Ambulatory Visit: Payer: Self-pay | Admitting: Gastroenterology

## 2015-09-08 ENCOUNTER — Encounter
Admission: RE | Disposition: A | Discharge status: Home / Self Care | Payer: Self-pay | Source: Ambulatory Visit | Attending: Gastroenterology

## 2015-09-08 DIAGNOSIS — I10 Essential (primary) hypertension: Secondary | ICD-10-CM | POA: Insufficient documentation

## 2015-09-08 DIAGNOSIS — E785 Hyperlipidemia, unspecified: Secondary | ICD-10-CM | POA: Diagnosis not present

## 2015-09-08 DIAGNOSIS — Z87442 Personal history of urinary calculi: Secondary | ICD-10-CM | POA: Diagnosis not present

## 2015-09-08 DIAGNOSIS — K7460 Unspecified cirrhosis of liver: Secondary | ICD-10-CM | POA: Insufficient documentation

## 2015-09-08 DIAGNOSIS — G4730 Sleep apnea, unspecified: Secondary | ICD-10-CM | POA: Insufficient documentation

## 2015-09-08 DIAGNOSIS — Z888 Allergy status to other drugs, medicaments and biological substances status: Secondary | ICD-10-CM | POA: Diagnosis not present

## 2015-09-08 DIAGNOSIS — I8510 Secondary esophageal varices without bleeding: Secondary | ICD-10-CM | POA: Diagnosis not present

## 2015-09-08 DIAGNOSIS — K2970 Gastritis, unspecified, without bleeding: Secondary | ICD-10-CM | POA: Insufficient documentation

## 2015-09-08 DIAGNOSIS — M549 Dorsalgia, unspecified: Secondary | ICD-10-CM | POA: Diagnosis not present

## 2015-09-08 DIAGNOSIS — Z7984 Long term (current) use of oral hypoglycemic drugs: Secondary | ICD-10-CM | POA: Diagnosis not present

## 2015-09-08 DIAGNOSIS — Z85820 Personal history of malignant melanoma of skin: Secondary | ICD-10-CM | POA: Diagnosis not present

## 2015-09-08 DIAGNOSIS — K3189 Other diseases of stomach and duodenum: Secondary | ICD-10-CM | POA: Diagnosis not present

## 2015-09-08 DIAGNOSIS — I8500 Esophageal varices without bleeding: Secondary | ICD-10-CM | POA: Diagnosis not present

## 2015-09-08 DIAGNOSIS — G8929 Other chronic pain: Secondary | ICD-10-CM | POA: Diagnosis not present

## 2015-09-08 DIAGNOSIS — Z7982 Long term (current) use of aspirin: Secondary | ICD-10-CM | POA: Insufficient documentation

## 2015-09-08 DIAGNOSIS — E119 Type 2 diabetes mellitus without complications: Secondary | ICD-10-CM | POA: Diagnosis not present

## 2015-09-08 DIAGNOSIS — I2510 Atherosclerotic heart disease of native coronary artery without angina pectoris: Secondary | ICD-10-CM | POA: Diagnosis not present

## 2015-09-08 DIAGNOSIS — K219 Gastro-esophageal reflux disease without esophagitis: Secondary | ICD-10-CM | POA: Insufficient documentation

## 2015-09-08 DIAGNOSIS — E291 Testicular hypofunction: Secondary | ICD-10-CM | POA: Insufficient documentation

## 2015-09-08 DIAGNOSIS — Z87891 Personal history of nicotine dependence: Secondary | ICD-10-CM | POA: Diagnosis not present

## 2015-09-08 HISTORY — PX: ESOPHAGOGASTRODUODENOSCOPY (EGD) WITH PROPOFOL: SHX5813

## 2015-09-08 HISTORY — DX: Gastro-esophageal reflux disease without esophagitis: K21.9

## 2015-09-08 HISTORY — DX: Presence of external hearing-aid: Z97.4

## 2015-09-08 LAB — GLUCOSE, CAPILLARY
GLUCOSE-CAPILLARY: 155 mg/dL — AB (ref 65–99)
Glucose-Capillary: 147 mg/dL — ABNORMAL HIGH (ref 65–99)

## 2015-09-08 SURGERY — ESOPHAGOGASTRODUODENOSCOPY (EGD) WITH PROPOFOL
Anesthesia: Monitor Anesthesia Care | Wound class: Clean Contaminated

## 2015-09-08 MED ORDER — LACTATED RINGERS IV SOLN
INTRAVENOUS | Status: DC
  Administered 2015-09-08: 08:00:00 via INTRAVENOUS

## 2015-09-08 MED ORDER — STERILE WATER FOR IRRIGATION IR SOLN
Status: DC | PRN
  Administered 2015-09-08: 09:00:00

## 2015-09-08 MED ORDER — PROPOFOL 10 MG/ML IV BOLUS
INTRAVENOUS | Status: DC | PRN
  Administered 2015-09-08: 20 mg via INTRAVENOUS
  Administered 2015-09-08: 100 mg via INTRAVENOUS
  Administered 2015-09-08: 20 mg via INTRAVENOUS

## 2015-09-08 MED ORDER — LIDOCAINE HCL (CARDIAC) 20 MG/ML IV SOLN
INTRAVENOUS | Status: DC | PRN
  Administered 2015-09-08: 40 mg via INTRAVENOUS

## 2015-09-08 MED ORDER — GLYCOPYRROLATE 0.2 MG/ML IJ SOLN
INTRAMUSCULAR | Status: DC | PRN
  Administered 2015-09-08: 0.2 mg via INTRAVENOUS

## 2015-09-08 SURGICAL SUPPLY — 39 items
BALLN DILATOR 10-12 8 (BALLOONS)
BALLN DILATOR 12-15 8 (BALLOONS)
BALLN DILATOR 15-18 8 (BALLOONS)
BALLN DILATOR CRE 0-12 8 (BALLOONS)
BALLN DILATOR ESOPH 8 10 CRE (MISCELLANEOUS) IMPLANT
BALLOON DILATOR 12-15 8 (BALLOONS) IMPLANT
BALLOON DILATOR 15-18 8 (BALLOONS) IMPLANT
BALLOON DILATOR CRE 0-12 8 (BALLOONS) IMPLANT
BLOCK BITE 60FR ADLT L/F GRN (MISCELLANEOUS) ×3 IMPLANT
CANISTER SUCT 1200ML W/VALVE (MISCELLANEOUS) ×3 IMPLANT
FCP ESCP3.2XJMB 240X2.8X (MISCELLANEOUS)
FORCEPS BIOP RAD 4 LRG CAP 4 (CUTTING FORCEPS) ×3 IMPLANT
FORCEPS BIOP RJ4 240 W/NDL (MISCELLANEOUS)
FORCEPS ESCP3.2XJMB 240X2.8X (MISCELLANEOUS) IMPLANT
GOWN CVR UNV OPN BCK APRN NK (MISCELLANEOUS) ×2 IMPLANT
GOWN ISOL THUMB LOOP REG UNIV (MISCELLANEOUS) ×4
HEMOCLIP INSTINCT (CLIP) IMPLANT
INJECTOR VARIJECT VIN23 (MISCELLANEOUS) IMPLANT
KIT CO2 TUBING (TUBING) IMPLANT
KIT DEFENDO VALVE AND CONN (KITS) IMPLANT
KIT ENDO PROCEDURE OLY (KITS) ×3 IMPLANT
LIGATOR MULTIBAND 6SHOOTER MBL (MISCELLANEOUS) IMPLANT
MARKER SPOT ENDO TATTOO 5ML (MISCELLANEOUS) IMPLANT
PAD GROUND ADULT SPLIT (MISCELLANEOUS) IMPLANT
SNARE SHORT THROW 13M SML OVAL (MISCELLANEOUS) IMPLANT
SNARE SHORT THROW 30M LRG OVAL (MISCELLANEOUS) IMPLANT
SPOT EX ENDOSCOPIC TATTOO (MISCELLANEOUS)
SUCTION POLY TRAP 4CHAMBER (MISCELLANEOUS) IMPLANT
SYR INFLATION 60ML (SYRINGE) IMPLANT
TRAP SUCTION POLY (MISCELLANEOUS) IMPLANT
TUBING CONN 6MMX3.1M (TUBING)
TUBING SUCTION CONN 0.25 STRL (TUBING) IMPLANT
UNDERPAD 30X60 958B10 (PK) (MISCELLANEOUS) IMPLANT
VALVE BIOPSY ENDO (VALVE) IMPLANT
VARIJECT INJECTOR VIN23 (MISCELLANEOUS)
WATER AUXILLARY (MISCELLANEOUS) IMPLANT
WATER STERILE IRR 250ML POUR (IV SOLUTION) ×3 IMPLANT
WATER STERILE IRR 500ML POUR (IV SOLUTION) IMPLANT
WIRE CRE 18-20MM 8CM F G (MISCELLANEOUS) IMPLANT

## 2015-09-08 NOTE — Transfer of Care (Signed)
Immediate Anesthesia Transfer of Care Note  Patient: Mason Wilson.  Procedure(s) Performed: Procedure(s) with comments: ESOPHAGOGASTRODUODENOSCOPY (EGD) WITH PROPOFOL (N/A) - CPAP Diabetic - oral meds  Patient Location: PACU  Anesthesia Type: MAC  Level of Consciousness: awake, alert  and patient cooperative  Airway and Oxygen Therapy: Patient Spontanous Breathing and Patient connected to supplemental oxygen  Post-op Assessment: Post-op Vital signs reviewed, Patient's Cardiovascular Status Stable, Respiratory Function Stable, Patent Airway and No signs of Nausea or vomiting  Post-op Vital Signs: Reviewed and stable  Complications: No apparent anesthesia complications

## 2015-09-08 NOTE — Anesthesia Preprocedure Evaluation (Signed)
Anesthesia Evaluation  Patient identified by MRN, date of birth, ID band Patient awake    Reviewed: Allergy & Precautions, H&P , Patient's Chart, lab work & pertinent test results  Airway Mallampati: II  TM Distance: >3 FB Neck ROM: full    Dental no notable dental hx.    Pulmonary sleep apnea , former smoker,    Pulmonary exam normal        Cardiovascular hypertension, + CAD  Normal cardiovascular exam  Stents placed around 2000.  No issues since then.   Neuro/Psych negative neurological ROS     GI/Hepatic GERD  Medicated,(+) Cirrhosis       ,   Endo/Other  diabetes  Renal/GU   negative genitourinary   Musculoskeletal   Abdominal   Peds  Hematology negative hematology ROS (+)   Anesthesia Other Findings   Reproductive/Obstetrics negative OB ROS                             Anesthesia Physical Anesthesia Plan  ASA: III  Anesthesia Plan: MAC   Post-op Pain Management:    Induction:   Airway Management Planned:   Additional Equipment:   Intra-op Plan:   Post-operative Plan:   Informed Consent: I have reviewed the patients History and Physical, chart, labs and discussed the procedure including the risks, benefits and alternatives for the proposed anesthesia with the patient or authorized representative who has indicated his/her understanding and acceptance.     Plan Discussed with: CRNA  Anesthesia Plan Comments:         Anesthesia Quick Evaluation

## 2015-09-08 NOTE — H&P (Signed)
[facility]  [address], [place], [place] [address] Phone: [phone] Fax : [phone]  Primary Care Physician:  Golden Pop, MD Primary Gastroenterologist:  Dr. Kahler Norris  Pre-Procedure History & Physical: HPI:  Mason Coombe. is a 74 y.o. male is here for an endoscopy.   Past Medical History  Diagnosis Date  . Allergy   . Hypogonadism in male   . Hyperlipidemia   . Hypertension   . CAD (coronary artery disease)   . Diabetes mellitus without complication ([place])   . Elevated liver enzymes   . Body mass index 39.0-39.9, adult   . Heart murmur   . Melanoma in situ of right shoulder ([place])   . Renal stones   . Chronic back pain   . Melanoma ([place])     melanoma   . GERD (gastroesophageal reflux disease)   . Arthritis     "everywhere' - worse in back  . Sleep apnea     CPAP  . Cirrhosis of liver ([place])   . Uses hearing aid     doesn't wear    Past Surgical History  Procedure Laterality Date  . Angioplasty  1999    3 stents  . Carpal tunnel release      x2  . Cataract extraction, bilateral    . Skin lesion excision  1998  . Tonsillectomy      age 89    Prior to Admission medications   Medication Sig Start Date End Date Taking? Authorizing Provider  aspirin 325 MG EC tablet Take 325 mg by mouth daily.   Yes Historical Provider, MD  atenolol (TENORMIN) 50 MG tablet Take 1 tablet (50 mg total) by mouth daily. Patient taking differently: Take 50 mg by mouth 2 (two) times daily.  07/08/15  Yes [name], MD  glipiZIDE (GLUCOTROL) 5 MG tablet Take 1 tablet (5 mg total) by mouth daily before breakfast. 07/08/15  Yes [name], MD  losartan (COZAAR) 100 MG tablet Take 1 tablet (100 mg total) by mouth daily. 07/08/15  Yes [name], MD  meloxicam (MOBIC) 15 MG tablet Take 1 tablet (15 mg total) by mouth daily. 07/08/15  Yes [name], MD  metFORMIN (GLUCOPHAGE) 500 MG tablet Take 2 tablets (1,000 mg total) by mouth 2 (two) times  daily with a meal. 07/08/15  Yes [name], MD  omeprazole (PRILOSEC) 20 MG capsule Take 20 mg by mouth daily. AM   Yes Historical Provider, MD  simvastatin (ZOCOR) 40 MG tablet Take 1 tablet (40 mg total) by mouth daily. 07/08/15  Yes [name], MD  tiZANidine (ZANAFLEX) 4 MG tablet 1/2-1 bid prn 07/22/15  Yes Historical Provider, MD  traMADol (ULTRAM) 50 MG tablet 1 po bid prn for pain 07/22/15  Yes Historical Provider, MD  ciprofloxacin (CIPRO) 500 MG tablet Take 1 tablet (500 mg total) by mouth 2 (two) times daily. 07/28/15   Megan P Johnson, DO  Multiple Vitamins-Minerals (CENTRUM SILVER ADULT 50+ PO) Take 1 tablet by mouth daily.    Historical Provider, MD  triamcinolone cream (KENALOG) 0.1 % Apply 1 application topically daily. 07/28/15   Historical Provider, MD    Allergies as of 09/04/2015 - Review Complete 09/04/2015  Allergen Reaction Noted  . Altace [ramipril] Cough 04/03/2015    Family History  Problem Relation Age of Onset  . Thyroid disease Mother   . Alzheimer's disease Mother   . Heart disease Mother   . Cancer  Father 47    testicular  . Heart disease Father   . Hypertension Father   . Heart attack Father     Social History   Social History  . Marital Status: Married    Spouse Name: N/A  . Number of Children: N/A  . Years of Education: N/A   Occupational History  . Not on file.   Social History Main Topics  . Smoking status: Former Smoker    Types: Cigarettes    Quit date: 04/02/1970  . Smokeless tobacco: Current User    Types: Chew     Comment: 1974  . Alcohol Use: No     Comment: stopped in 1996  . Drug Use: No  . Sexual Activity:    Partners: Female   Other Topics Concern  . Not on file   Social History Narrative    Review of Systems: See HPI, otherwise negative ROS  Physical Exam: BP 134/77 mmHg  Pulse 67  Temp(Src) 98.4 F (36.9 C) (Temporal)  Resp 14  Ht 5\' 9"  (1.753 m)  Wt 248 lb (112.492 kg)  BMI 36.61 kg/m2  SpO2  98% General:   Alert,  pleasant and cooperative in NAD Head:  Normocephalic and atraumatic. Neck:  Supple; no masses or thyromegaly. Lungs:  Clear throughout to auscultation.    Heart:  Regular rate and rhythm. Abdomen:  Soft, nontender and nondistended. Normal bowel sounds, without guarding, and without rebound.   Neurologic:  Alert and  oriented x4;  grossly normal neurologically.  Impression/Plan: Mason Wilson. is here for an endoscopy to be performed for cirrhosis  Risks, benefits, limitations, and alternatives regarding  endoscopy have been reviewed with the patient.  Questions have been answered.  All parties agreeable.   Ollen Bowl, MD  09/08/2015, 8:21 AM

## 2015-09-08 NOTE — Op Note (Signed)
[facility] Patient Name: Mason Wilson Procedure Date: 09/08/2015 8:18 AM MRN: [id number] Account #: [id number] Date of Birth: Jun 27, 1941 Admit Type: Outpatient Age: 74 Room: [facility] OR ROOM 01 Gender: Male Note Status: Finalized Procedure:         Upper GI endoscopy Indications:       Cirrhosis rule out esophageal varices Providers:         Lucilla Lame, MD Referring MD:      [name], MD (Referring MD) Medicines:         Propofol per Anesthesia Complications:     No immediate complications. Procedure:         Pre-Anesthesia Assessment:                    - Prior to the procedure, a History and Physical was                     performed, and patient medications and allergies were                     reviewed. The patient's tolerance of previous anesthesia                     was also reviewed. The risks and benefits of the procedure                     and the sedation options and risks were discussed with the                     patient. All questions were answered, and informed consent                     was obtained. Prior Anticoagulants: The patient has taken                     no previous anticoagulant or antiplatelet agents. ASA                     Grade Assessment: II - A patient with mild systemic                     disease. After reviewing the risks and benefits, the                     patient was deemed in satisfactory condition to undergo                     the procedure.                    After obtaining informed consent, the endoscope was passed                     under direct vision. Throughout the procedure, the                     patient's blood pressure, pulse, and oxygen saturations                     were monitored continuously. The Olympus GIF-HQ190                     Endoscope (S#. [id number]) was introduced through the mouth,  and advanced to the second part of duodenum. The upper GI              endoscopy was accomplished without difficulty. The patient                     tolerated the procedure well. Findings:      Grade I varices were found in the lower third of the esophagus.      Localized moderate inflammation characterized by erosions was found in       the gastric antrum. Biopsies were taken with a cold forceps for       histology.      The examined duodenum was normal. Impression:        - Grade I esophageal varices.                    - Gastritis. Biopsied.                    - Normal examined duodenum. Recommendation:    - Await pathology results. Procedure Code(s): --- Professional ---                    [phone], Esophagogastroduodenoscopy, flexible, transoral;                     with biopsy, single or multiple Diagnosis Code(s): --- Professional ---                    K74.60, Unspecified cirrhosis of liver                    K29.70, Gastritis, unspecified, without bleeding                    I85.10, Secondary esophageal varices without bleeding CPT copyright 2014 American Medical Association. All rights reserved. The codes documented in this report are preliminary and upon coder review may  be revised to meet current compliance requirements. Lucilla Lame, MD 09/08/2015 8:37:08 AM This report has been signed electronically. Number of Addenda: 0 Note Initiated On: 09/08/2015 8:18 AM      [facility]

## 2015-09-08 NOTE — Anesthesia Procedure Notes (Signed)
Procedure Name: MAC Performed by: Kmya Placide Pre-anesthesia Checklist: Patient identified, Emergency Drugs available, Suction available, Timeout performed and Patient being monitored Patient Re-evaluated:Patient Re-evaluated prior to inductionOxygen Delivery Method: Nasal cannula Placement Confirmation: positive ETCO2     

## 2015-09-08 NOTE — Anesthesia Postprocedure Evaluation (Addendum)
  Anesthesia Post-op Note  Patient: Mason Wilson.  Procedure(s) Performed: Procedure(s) with comments: ESOPHAGOGASTRODUODENOSCOPY (EGD) WITH PROPOFOL (N/A) - CPAP Diabetic - oral meds  Anesthesia type:MAC  Patient location: PACU  Post pain: Pain level controlled  Post assessment: Post-op Vital signs reviewed, Patient's Cardiovascular Status Stable, Respiratory Function Stable, Patent Airway and No signs of Nausea or vomiting  Post vital signs: Reviewed and stable  Last Vitals:  Filed Vitals:   09/08/15 0853  BP:   Pulse: 73  Temp:   Resp: 27    Level of consciousness: awake, alert  and patient cooperative  Complications: No apparent anesthesia complications

## 2015-09-09 ENCOUNTER — Encounter: Payer: Self-pay | Admitting: Gastroenterology

## 2015-09-10 ENCOUNTER — Telehealth: Payer: Self-pay

## 2015-09-10 NOTE — Telephone Encounter (Signed)
LVM for pt to return my call regarding lab results.   

## 2015-09-10 NOTE — Telephone Encounter (Signed)
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 This diagnosis can only be made definitively with a liver biopsy. The patient should be set up for a liver biopsy.

## 2015-09-11 ENCOUNTER — Encounter: Payer: Self-pay | Admitting: Gastroenterology

## 2015-09-11 NOTE — Telephone Encounter (Signed)
Pt notified of lab results. Pt would like to move forward and have liver biopsy. Will contact juanita at [facility] to schedule.

## 2015-09-11 NOTE — Telephone Encounter (Signed)
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 This diagnosis can only be made definitively with a liver biopsy. The patient should be set up for a liver biopsy.

## 2015-09-15 ENCOUNTER — Telehealth: Payer: Self-pay

## 2015-09-15 ENCOUNTER — Telehealth: Payer: Self-pay | Admitting: Gastroenterology

## 2015-09-15 ENCOUNTER — Other Ambulatory Visit: Payer: Self-pay

## 2015-09-15 DIAGNOSIS — M62830 Muscle spasm of back: Secondary | ICD-10-CM | POA: Insufficient documentation

## 2015-09-15 DIAGNOSIS — R748 Abnormal levels of other serum enzymes: Secondary | ICD-10-CM

## 2015-09-15 DIAGNOSIS — M5136 Other intervertebral disc degeneration, lumbar region: Secondary | ICD-10-CM | POA: Insufficient documentation

## 2015-09-15 NOTE — Telephone Encounter (Signed)
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 This diagnosis can only be made definitively with a liver biopsy. The patient should be set up for a liver biopsy.

## 2015-09-15 NOTE — Telephone Encounter (Signed)
His wife Jenny Reichmann left a voice message that they still don't have results from liver biopsy.

## 2015-09-15 NOTE — Telephone Encounter (Signed)
Pt has been notified of lab results and Dr. Dorothey Baseman recommendation of a liver biopsy. Pt has been scheduled at [facility] on Friday, Nov 11th at 11:00am. I have left pt the appt information.

## 2015-09-18 ENCOUNTER — Other Ambulatory Visit: Payer: Self-pay | Admitting: Radiology

## 2015-09-19 ENCOUNTER — Ambulatory Visit
Admission: RE | Admit: 2015-09-19 | Discharge: 2015-09-19 | Disposition: A | Discharge status: Home / Self Care | Payer: Medicare Other | Source: Ambulatory Visit | Attending: Gastroenterology | Admitting: Gastroenterology

## 2015-09-19 DIAGNOSIS — K7581 Nonalcoholic steatohepatitis (NASH): Secondary | ICD-10-CM | POA: Diagnosis not present

## 2015-09-19 DIAGNOSIS — R748 Abnormal levels of other serum enzymes: Secondary | ICD-10-CM | POA: Diagnosis not present

## 2015-09-19 DIAGNOSIS — R7989 Other specified abnormal findings of blood chemistry: Secondary | ICD-10-CM | POA: Diagnosis not present

## 2015-09-19 LAB — CBC
HCT: 36.3 % — ABNORMAL LOW (ref 40.0–52.0)
Hemoglobin: 12.2 g/dL — ABNORMAL LOW (ref 13.0–18.0)
MCH: 28.9 pg (ref 26.0–34.0)
MCHC: 33.5 g/dL (ref 32.0–36.0)
MCV: 86.1 fL (ref 80.0–100.0)
PLATELETS: 90 10*3/uL — AB (ref 150–440)
RBC: 4.21 MIL/uL — AB (ref 4.40–5.90)
RDW: 13.9 % (ref 11.5–14.5)
WBC: 3.5 10*3/uL — AB (ref 3.8–10.6)

## 2015-09-19 LAB — PROTIME-INR
INR: 1.2
Prothrombin Time: 15.4 seconds — ABNORMAL HIGH (ref 11.4–15.0)

## 2015-09-19 LAB — APTT: APTT: 28 s (ref 24–36)

## 2015-09-19 LAB — GLUCOSE, CAPILLARY: GLUCOSE-CAPILLARY: 166 mg/dL — AB (ref 65–99)

## 2015-09-19 MED ORDER — MIDAZOLAM HCL 2 MG/2ML IJ SOLN
INTRAMUSCULAR | Status: AC | PRN
  Administered 2015-09-19: 1 mg via INTRAVENOUS

## 2015-09-19 MED ORDER — SODIUM CHLORIDE 0.9 % IV SOLN
Freq: Once | INTRAVENOUS | Status: AC
  Administered 2015-09-19: 10:00:00 via INTRAVENOUS

## 2015-09-19 MED ORDER — FENTANYL CITRATE (PF) 100 MCG/2ML IJ SOLN
INTRAMUSCULAR | Status: AC | PRN
  Administered 2015-09-19: 50 ug via INTRAVENOUS

## 2015-09-19 NOTE — Procedures (Signed)
Interventional Radiology Procedure Note  Procedure: US biopsy of liver, for medical liver disease  Complications: None Recommendations:  - Ok to shower tomorrow - Do not submerge for 7 days - Routine care   Signed,  Dulcy Fanny. Earleen Newport, DO

## 2015-09-19 NOTE — Discharge Instructions (Signed)
Liver Biopsy, Care After °Refer to this sheet in the next few weeks. These instructions provide you with information on caring for yourself after your procedure. Your health care provider may also give you more specific instructions. Your treatment has been planned according to current medical practices, but problems sometimes occur. Call your health care provider if you have any problems or questions after your procedure. °WHAT TO EXPECT AFTER THE PROCEDURE °After your procedure, it is typical to have the following: °· A small amount of discomfort in the area where the biopsy was done and in the right shoulder or shoulder blade. °· A small amount of bruising around the area where the biopsy was done and on the skin over the liver. °· Sleepiness and fatigue for the rest of the day. °HOME CARE INSTRUCTIONS  °· Rest at home for 1-2 days or as directed by your health care provider. °· Have a friend or family member stay with you for at least 24 hours. °· Because of the medicines used during the procedure, you should not do the following things in the first 24 hours: °¨ Drive. °¨ Use machinery. °¨ Be responsible for the care of other people. °¨ Sign legal documents. °¨ Take a bath or shower. °· There are many different ways to close and cover an incision, including stitches, skin glue, and adhesive strips. Follow your health care provider's instructions on: °¨ Incision care. °¨ Bandage (dressing) changes and removal. °¨ Incision closure removal. °· Do not drink alcohol in the first week. °· Do not lift more than 5 pounds or play contact sports for 2 weeks after this test. °· Take medicines only as directed by your health care provider. Do not take medicine containing aspirin or non-steroidal anti-inflammatory medicines such as ibuprofen for 1 week after this test. °· It is your responsibility to get your test results. °SEEK MEDICAL CARE IF:  °· You have increased bleeding from an incision that results in more than a  small spot of blood. °· You have redness, swelling, or increasing pain in any incisions. °· You notice a discharge or a bad smell coming from any of your incisions. °· You have a fever or chills. °SEEK IMMEDIATE MEDICAL CARE IF:  °· You develop swelling, bloating, or pain in your abdomen. °· You become dizzy or faint. °· You develop a rash. °· You are nauseous or vomit. °· You have difficulty breathing, feel short of breath, or feel faint. °· You develop chest pain. °· You have problems with your speech or vision. °· You have trouble balancing or moving your arms or legs. °  °This information is not intended to replace advice given to you by your health care provider. Make sure you discuss any questions you have with your health care provider. °  °Document Released: 05/14/2005 Document Revised: 11/15/2014 Document Reviewed: 12/21/2013 °Elsevier Interactive Patient Education ©2016 Elsevier Inc. ° °

## 2015-09-22 LAB — SURGICAL PATHOLOGY

## 2015-09-29 ENCOUNTER — Telehealth: Payer: Self-pay

## 2015-09-29 NOTE — Telephone Encounter (Signed)
Please review pt's liver biopsy results. Not sure why you never received notice.

## 2015-09-29 NOTE — Telephone Encounter (Signed)
The patient know that the biopsy of the liver showed cirrhosis due to fatty liver disease. Have him come in for a future office visit to discuss how to decrease the fat in his liver.

## 2015-09-30 NOTE — Telephone Encounter (Signed)
Pt notified of liver biopsy results. Pt scheduled for a follow up appt to discuss on Dec 19th.

## 2015-10-13 ENCOUNTER — Ambulatory Visit (INDEPENDENT_AMBULATORY_CARE_PROVIDER_SITE_OTHER): Payer: Medicare Other | Admitting: Family Medicine

## 2015-10-13 ENCOUNTER — Encounter: Payer: Self-pay | Admitting: Family Medicine

## 2015-10-13 VITALS — BP 148/72 | HR 60 | Temp 98.1°F | Ht 68.8 in | Wt 246.0 lb

## 2015-10-13 DIAGNOSIS — I10 Essential (primary) hypertension: Secondary | ICD-10-CM | POA: Diagnosis not present

## 2015-10-13 DIAGNOSIS — I2510 Atherosclerotic heart disease of native coronary artery without angina pectoris: Secondary | ICD-10-CM

## 2015-10-13 DIAGNOSIS — E119 Type 2 diabetes mellitus without complications: Secondary | ICD-10-CM | POA: Diagnosis not present

## 2015-10-13 LAB — BAYER DCA HB A1C WAIVED: HB A1C: 7 % — AB (ref <7.0)

## 2015-10-13 MED ORDER — HYDROCHLOROTHIAZIDE 25 MG PO TABS: 25.0000 mg | ORAL_TABLET | Freq: Every day | ORAL | Status: DC

## 2015-10-13 MED ORDER — ATENOLOL 100 MG PO TABS: 100.0000 mg | ORAL_TABLET | Freq: Every day | ORAL | Status: DC

## 2015-10-13 NOTE — Assessment & Plan Note (Signed)
The current medical regimen is effective;  continue present plan and medications.  

## 2015-10-13 NOTE — Progress Notes (Signed)
BP 148/72 mmHg  Pulse 60  Temp(Src) 98.1 F (36.7 C)  Ht 5' 8.8" (1.748 m)  Wt 246 lb (111.585 kg)  BMI 36.52 kg/m2  SpO2 97%   Subjective:    Patient ID: Mason Chute., male    DOB: July 29, 1941, 74 y.o.   MRN: [id number]  HPI: Mason Wilson is a 74 y.o. male  Chief Complaint  Patient presents with  . Hypertension  . High Blood Count  . Diabetes   Patient here with wife who provides some history.  had a wide going on with workup for pancytopenia by hematology with current no diagnosis GI workup including liver biopsy still pending with liver biopsy report attached to this note which has not been reviewed with patient. Patient's blood pressures been elevated atenolol was increased from 50-100 mg with some improvement in blood pressure no complaints of side effects or fatigue noted noticed change in pulse. Patient's been trying to lose weight already with 6 pound weight loss Relevant past medical, surgical, family and social history reviewed and updated as indicated. Interim medical history since our last visit reviewed. Allergies and medications reviewed and updated.  Review of Systems  Constitutional: Negative.   HENT:       Has some spontaneous nosebleeds now that the heat on  Respiratory: Negative.   Cardiovascular: Negative.     Per HPI unless specifically indicated above     Objective:    BP 148/72 mmHg  Pulse 60  Temp(Src) 98.1 F (36.7 C)  Ht 5' 8.8" (1.748 m)  Wt 246 lb (111.585 kg)  BMI 36.52 kg/m2  SpO2 97%  Wt Readings from Last 3 Encounters:  10/13/15 246 lb (111.585 kg)  09/08/15 248 lb (112.492 kg)  09/04/15 251 lb (113.853 kg)    Physical Exam  Constitutional: He is oriented to person, place, and time. He appears well-developed and well-nourished. No distress.  HENT:  Head: Normocephalic and atraumatic.  Right Ear: Hearing normal.  Left Ear: Hearing normal.  Nose: Nose normal.  Nose with some redness and cracking of septal area   Eyes: Conjunctivae and lids are normal. Right eye exhibits no discharge. Left eye exhibits no discharge. No scleral icterus.  Cardiovascular: Normal rate, regular rhythm and normal heart sounds.   Pulmonary/Chest: Effort normal and breath sounds normal. No respiratory distress.  Musculoskeletal: Normal range of motion.  Neurological: He is alert and oriented to person, place, and time.  Skin: Skin is intact. No rash noted.  Psychiatric: He has a normal mood and affect. His speech is normal and behavior is normal. Judgment and thought content normal. Cognition and memory are normal.    Results for orders placed or performed during the hospital encounter of 09/19/15  APTT upon arrival  Result Value Ref Range   aPTT 28 24 - 36 seconds  CBC upon arrival  Result Value Ref Range   WBC 3.5 (L) 3.8 - 10.6 K/uL   RBC 4.21 (L) 4.40 - 5.90 MIL/uL   Hemoglobin 12.2 (L) 13.0 - 18.0 g/dL   HCT 36.3 (L) 40.0 - 52.0 %   MCV 86.1 80.0 - 100.0 fL   MCH 28.9 26.0 - 34.0 pg   MCHC 33.5 32.0 - 36.0 g/dL   RDW 13.9 11.5 - 14.5 %   Platelets 90 (L) 150 - 440 K/uL  Protime-INR upon arrival  Result Value Ref Range   Prothrombin Time 15.4 (H) 11.4 - 15.0 seconds   INR 1.20   Glucose,  capillary  Result Value Ref Range   Glucose-Capillary 166 (H) 65 - 99 mg/dL  Surgical pathology  Result Value Ref Range   SURGICAL PATHOLOGY      Surgical Pathology CASE: [id number] PATIENT: Mason Wilson Surgical Pathology Report     SPECIMEN SUBMITTED: A. Liver, right, biopsy  CLINICAL HISTORY: 74 y.o. y/o male was a new finding of cirrhosis on a imaging exam. Very mild elevation of AST, ALT, and Alk Phos. Positive dsDNA Ab. Normal total protein. Viral serologies negative. Does not endorse alcohol use. BMI 37.05 kg/m2.  PRE-OPERATIVE DIAGNOSIS: Unknown cause of cirrhosis, medical liver biopsy  POST-OPERATIVE DIAGNOSIS: Same as pre-op     DIAGNOSIS: A. LIVER, RIGHT; ULTRASOUND GUIDED  BIOPSY: -STEATOHEPATITIS, GRADE 1 (OF 3), STAGE 4 (CIRRHOSIS).  Comment: Sections demonstrate 4 cores of intact hepatic parenchyma with adequate numbers of portal tracts present for evaluation. Macrovesicular steatosis involves approximately 10% of the parenchyma with associated areas of hepatocyte ballooning and injury. Spotty necrosis and apoptotic hepatocytes are present. Cholestasis is not identified. The ma jority of the portal areas contain a mild to moderately dense mixed inflammatory infiltrate composed predominantly of lymphocytes with occasional plasma cells and eosinophils. A few of the portal areas contain lymphoid aggregates. Bile ducts are intact without inflammation. There is no stainable iron. PASD stain highlights ceroid-containing Kupffer cells and portal histiocytes consistent with hepatitic process. Intrahepatocyte PAS D globules are not identified. The trichrome stain highlights early cirrhosis as well as pericellular fibrosis in areas of steatosis with hepatocellular ballooning. Stain controls worked appropriately. Overall the findings are consistent with progressed steatohepatitis. Histologic features of autoimmune hepatitis are not identified.    GROSS DESCRIPTION:  A. Labeled: liver  Tissue fragment(s): 4 cores  Size: 0.7 to 1.1 cm in length and in diameter < 0.1 cm  Description: Received in saline and placed in formalin are yellow to brown cor es of tissue , wrapped in lens paper and submitted in a mesh bag.  Entirely submitted in one cassette(s).    Final Diagnosis performed by Quay Burow, MD.  Electronically signed 09/22/2015 9:37:47AM    The electronic signature indicates that the named Attending Pathologist has evaluated the specimen  Technical component performed at [facility], [address], [place], [place] [address] Lab: [phone] Dir: Darrick Penna. Evette Doffing, MD  Professional component performed at [facility], [facility], [place], [place], [place] [address] Lab: [phone] Dir: Dellia Nims. Rubinas, MD        Assessment & Plan:   Problem List Items Addressed This Visit      Cardiovascular and Mediastinum   Essential hypertension    Continued poor control will add amlodipine recheck blood pressure 1 month      Relevant Medications   atenolol (TENORMIN) 100 MG tablet   hydrochlorothiazide (HYDRODIURIL) 25 MG tablet     Endocrine   Diabetes mellitus without complication ([place]) - Primary    The current medical regimen is effective;  continue present plan and medications.       Relevant Orders   Bayer DCA Hb A1c Waived     Vaseline for nosebleeds  Follow up plan: Return in about 4 weeks (around 11/10/2015) for BP check and BMP.

## 2015-10-13 NOTE — Assessment & Plan Note (Signed)
Continued poor control will add amlodipine recheck blood pressure 1 month

## 2015-10-17 ENCOUNTER — Other Ambulatory Visit: Payer: Self-pay

## 2015-10-27 ENCOUNTER — Ambulatory Visit (INDEPENDENT_AMBULATORY_CARE_PROVIDER_SITE_OTHER): Payer: Medicare Other | Admitting: Gastroenterology

## 2015-10-27 ENCOUNTER — Encounter: Payer: Self-pay | Admitting: Gastroenterology

## 2015-10-27 VITALS — BP 129/65 | HR 62 | Temp 98.0°F | Ht 69.0 in | Wt 251.0 lb

## 2015-10-27 DIAGNOSIS — K7469 Other cirrhosis of liver: Secondary | ICD-10-CM

## 2015-10-27 DIAGNOSIS — I2510 Atherosclerotic heart disease of native coronary artery without angina pectoris: Secondary | ICD-10-CM | POA: Diagnosis not present

## 2015-10-27 NOTE — Progress Notes (Signed)
Primary Care Physician: Golden Pop, MD  Primary Gastroenterologist:  Dr. Lucilla Lame  Chief Complaint  Patient presents with  . Follow up liver biospy results    HPI: Mason Wilson. is a 74 y.o. male here for follow-up after having a liver biopsy. The patient's liver biopsy showed grade 1 stage IV. The stage IV represent cirrhosis. The biopsies did show early cirrhosis with mild to moderate inflammation. He should has lost a and considerable amount of weight lately explains why he has only mild to moderate inflammation.   Current Outpatient Prescriptions  Medication Sig Dispense Refill  . aspirin 325 MG EC tablet Take 325 mg by mouth daily.    Marland Kitchen atenolol (TENORMIN) 100 MG tablet Take 1 tablet (100 mg total) by mouth daily. 90 tablet 4  . glipiZIDE (GLUCOTROL) 5 MG tablet Take 1 tablet (5 mg total) by mouth daily before breakfast. 90 tablet 4  . hydrochlorothiazide (HYDRODIURIL) 25 MG tablet Take 1 tablet (25 mg total) by mouth daily. 30 tablet 3  . losartan (COZAAR) 100 MG tablet Take 1 tablet (100 mg total) by mouth daily. 90 tablet 4  . metFORMIN (GLUCOPHAGE) 500 MG tablet Take 2 tablets (1,000 mg total) by mouth 2 (two) times daily with a meal. 180 tablet 4  . omeprazole (PRILOSEC) 20 MG capsule Take 20 mg by mouth daily. AM    . simvastatin (ZOCOR) 40 MG tablet Take 1 tablet (40 mg total) by mouth daily. 90 tablet 4  . triamcinolone cream (KENALOG) 0.1 % Apply 1 application topically daily.  1  . meloxicam (MOBIC) 15 MG tablet Take 1 tablet (15 mg total) by mouth daily. (Patient not taking: Reported on 10/27/2015) 30 tablet 3  . tiZANidine (ZANAFLEX) 4 MG tablet Reported on 10/27/2015    . traMADol (ULTRAM) 50 MG tablet Reported on 10/27/2015     No current facility-administered medications for this visit.    Allergies as of 10/27/2015 - Review Complete 10/13/2015  Allergen Reaction Noted  . Altace [ramipril] Cough 04/03/2015    ROS:  General: Negative for anorexia,  weight loss, fever, chills, fatigue, weakness. ENT: Negative for hoarseness, difficulty swallowing , nasal congestion. CV: Negative for chest pain, angina, palpitations, dyspnea on exertion, peripheral edema.  Respiratory: Negative for dyspnea at rest, dyspnea on exertion, cough, sputum, wheezing.  GI: See history of present illness. GU:  Negative for dysuria, hematuria, urinary incontinence, urinary frequency, nocturnal urination.  Endo: Negative for unusual weight change.    Physical Examination:   BP 129/65 mmHg  Pulse 62  Temp(Src) 98 F (36.7 C) (Oral)  Ht 5\' 9"  (1.753 m)  Wt 251 lb (113.853 kg)  BMI 37.05 kg/m2  General: Well-nourished, well-developed in no acute distress.  Eyes: No icterus. Conjunctivae pink. Mouth: Oropharyngeal mucosa moist and pink , no lesions erythema or exudate. Lungs: Clear to auscultation bilaterally. Non-labored. Heart: Regular rate and rhythm, no murmurs rubs or gallops.  Abdomen: Bowel sounds are normal, nontender, nondistended, no hepatosplenomegaly or masses, no abdominal bruits and there was a ventral hernia seen. , no rebound or guarding.   Extremities: No lower extremity edema. No clubbing or deformities. Neuro: Alert and oriented x 3.  Grossly intact. Skin: Warm and dry, no jaundice.   Psych: Alert and cooperative, normal mood and affect.  Labs:   Imaging Studies: No results found.  Assessment and Plan:   [name]. is a 74 y.o. y/o male who comes in today for follow-up of his  liver biopsy. The patient's liver biopsy showed him to have steatohepatitis and no sign of autoimmune hepatitis. The patient has been told to continue losing weight and should have his liver enzymes checked in 3 months. The patient will also need surveillance for hepatic failure carcinoma due to his cirrhosis and will be set up for a ultrasound of the liver and alpha-fetoprotein in 6 months. The patient has been explained the plan and agrees with  it.   Note: This dictation was prepared with Dragon dictation along with smaller phrase technology. Any transcriptional errors that result from this process are unintentional.

## 2015-12-11 ENCOUNTER — Other Ambulatory Visit: Payer: Self-pay | Admitting: Family Medicine

## 2016-01-13 ENCOUNTER — Encounter: Payer: Self-pay | Admitting: Family Medicine

## 2016-01-13 ENCOUNTER — Ambulatory Visit (INDEPENDENT_AMBULATORY_CARE_PROVIDER_SITE_OTHER): Payer: Medicare Other | Admitting: Family Medicine

## 2016-01-13 VITALS — BP 113/64 | HR 62 | Temp 98.6°F | Ht 69.2 in | Wt 253.0 lb

## 2016-01-13 DIAGNOSIS — E785 Hyperlipidemia, unspecified: Secondary | ICD-10-CM | POA: Diagnosis not present

## 2016-01-13 DIAGNOSIS — K7469 Other cirrhosis of liver: Secondary | ICD-10-CM | POA: Diagnosis not present

## 2016-01-13 DIAGNOSIS — I2510 Atherosclerotic heart disease of native coronary artery without angina pectoris: Secondary | ICD-10-CM | POA: Diagnosis not present

## 2016-01-13 DIAGNOSIS — I10 Essential (primary) hypertension: Secondary | ICD-10-CM

## 2016-01-13 DIAGNOSIS — E119 Type 2 diabetes mellitus without complications: Secondary | ICD-10-CM

## 2016-01-13 DIAGNOSIS — I2583 Coronary atherosclerosis due to lipid rich plaque: Secondary | ICD-10-CM

## 2016-01-13 LAB — BAYER DCA HB A1C WAIVED: HB A1C (BAYER DCA - WAIVED): 7.7 % — ABNORMAL HIGH (ref <7.0)

## 2016-01-13 LAB — LP+ALT+AST PICCOLO, WAIVED
ALT (SGPT) PICCOLO, WAIVED: 56 U/L — AB (ref 10–47)
AST (SGOT) PICCOLO, WAIVED: 47 U/L — AB (ref 11–38)
CHOL/HDL RATIO PICCOLO,WAIVE: 3.1 mg/dL
CHOLESTEROL PICCOLO, WAIVED: 118 mg/dL (ref <200)
HDL Chol Piccolo, Waived: 38 mg/dL — ABNORMAL LOW (ref >59)
LDL Chol Calc Piccolo Waived: 57 mg/dL (ref <100)
TRIGLYCERIDES PICCOLO,WAIVED: 116 mg/dL (ref <150)
VLDL Chol Calc Piccolo,Waive: 23 mg/dL (ref <30)

## 2016-01-13 NOTE — Assessment & Plan Note (Signed)
The current medical regimen is effective;  continue present plan and medications.  

## 2016-01-13 NOTE — Assessment & Plan Note (Signed)
Followed by GI

## 2016-01-13 NOTE — Progress Notes (Signed)
BP 113/64 mmHg  Pulse 62  Temp(Src) 98.6 F (37 C)  Ht 5' 9.2" (1.758 m)  Wt 253 lb (114.76 kg)  BMI 37.13 kg/m2  SpO2 98%   Subjective:    Patient ID: [name]., male    DOB: Mar 11, 1941, 75 y.o.   MRN: [id number]  HPI: Afraz Hula is a 75 y.o. male  Chief Complaint  Patient presents with  . Diabetes  . Hypertension  . Hyperlipidemia   Patient follow-up medical problems doing well all in all except for as noticed with weight gain blood sugars gone up some too. No complaints from other medications blood pressure good control cholesterol doing well. Patient's followed by gastroenterology for liver cirrhosis which is stable for now. Has another workup pending in April at his next appointment. Relevant past medical, surgical, family and social history reviewed and updated as indicated. Interim medical history since our last visit reviewed. Allergies and medications reviewed and updated.  Review of Systems  Constitutional: Negative.   Respiratory: Negative.   Cardiovascular: Negative.     Per HPI unless specifically indicated above     Objective:    BP 113/64 mmHg  Pulse 62  Temp(Src) 98.6 F (37 C)  Ht 5' 9.2" (1.758 m)  Wt 253 lb (114.76 kg)  BMI 37.13 kg/m2  SpO2 98%  Wt Readings from Last 3 Encounters:  01/13/16 253 lb (114.76 kg)  10/27/15 251 lb (113.853 kg)  10/13/15 246 lb (111.585 kg)    Physical Exam  Constitutional: He is oriented to person, place, and time. He appears well-developed and well-nourished. No distress.  HENT:  Head: Normocephalic and atraumatic.  Right Ear: Hearing normal.  Left Ear: Hearing normal.  Nose: Nose normal.  Eyes: Conjunctivae and lids are normal. Right eye exhibits no discharge. Left eye exhibits no discharge. No scleral icterus.  Cardiovascular: Normal rate, regular rhythm and normal heart sounds.   Pulmonary/Chest: Effort normal and breath sounds normal. No respiratory distress.  Musculoskeletal:  Normal range of motion.  Neurological: He is alert and oriented to person, place, and time.  Skin: Skin is intact. No rash noted.  Psychiatric: He has a normal mood and affect. His speech is normal and behavior is normal. Judgment and thought content normal. Cognition and memory are normal.    Results for orders placed or performed in visit on 10/13/15  Bayer DCA Hb A1c Waived  Result Value Ref Range   Bayer DCA Hb A1c Waived 7.0 (H) <7.0 %      Assessment & Plan:   Problem List Items Addressed This Visit      Cardiovascular and Mediastinum   Essential hypertension    The current medical regimen is effective;  continue present plan and medications.       Relevant Orders   Bayer DCA Hb A1c Waived   LP+ALT+AST Piccolo, Waived   Basic metabolic panel   CAD (coronary artery disease)    The current medical regimen is effective;  continue present plan and medications.         Digestive   Hepatic cirrhosis (HCC)    Followed by GI        Endocrine   Diabetes mellitus without complication ([place]) - Primary    discuss elevated A1c of 7.7 patient will do better with diet exercise nutrition to gain control      Relevant Orders   Bayer DCA Hb A1c Waived   LP+ALT+AST Piccolo, Waived   Basic metabolic panel  Other   Hyperlipidemia   Relevant Orders   Bayer DCA Hb A1c [address] Waived   Basic metabolic panel       Follow up plan: Return in about 3 months (around 04/14/2016) for A repeat A1c.

## 2016-01-13 NOTE — Assessment & Plan Note (Signed)
discuss elevated A1c of 7.7 patient will do better with diet exercise nutrition to gain control

## 2016-01-14 ENCOUNTER — Encounter: Payer: Self-pay | Admitting: Family Medicine

## 2016-01-14 LAB — BASIC METABOLIC PANEL
BUN/Creatinine Ratio: 22 (ref 10–22)
BUN: 23 mg/dL (ref 8–27)
CALCIUM: 9.1 mg/dL (ref 8.6–10.2)
CO2: 22 mmol/L (ref 18–29)
CREATININE: 1.05 mg/dL (ref 0.76–1.27)
Chloride: 106 mmol/L (ref 96–106)
GFR, EST AFRICAN AMERICAN: 81 mL/min/{1.73_m2} (ref >59)
GFR, EST NON AFRICAN AMERICAN: 70 mL/min/{1.73_m2} (ref >59)
Glucose: 135 mg/dL — ABNORMAL HIGH (ref 65–99)
POTASSIUM: 4.1 mmol/L (ref 3.5–5.2)
Sodium: 143 mmol/L (ref 134–144)

## 2016-02-04 ENCOUNTER — Other Ambulatory Visit: Payer: Self-pay | Admitting: Family Medicine

## 2016-02-04 DIAGNOSIS — R21 Rash and other nonspecific skin eruption: Secondary | ICD-10-CM | POA: Diagnosis not present

## 2016-02-04 DIAGNOSIS — L821 Other seborrheic keratosis: Secondary | ICD-10-CM | POA: Diagnosis not present

## 2016-02-04 DIAGNOSIS — L280 Lichen simplex chronicus: Secondary | ICD-10-CM | POA: Diagnosis not present

## 2016-02-04 DIAGNOSIS — L578 Other skin changes due to chronic exposure to nonionizing radiation: Secondary | ICD-10-CM | POA: Diagnosis not present

## 2016-02-04 DIAGNOSIS — L918 Other hypertrophic disorders of the skin: Secondary | ICD-10-CM | POA: Diagnosis not present

## 2016-02-04 DIAGNOSIS — S90931A Unspecified superficial injury of right great toe, initial encounter: Secondary | ICD-10-CM | POA: Diagnosis not present

## 2016-02-04 DIAGNOSIS — L308 Other specified dermatitis: Secondary | ICD-10-CM | POA: Diagnosis not present

## 2016-02-04 DIAGNOSIS — Z1283 Encounter for screening for malignant neoplasm of skin: Secondary | ICD-10-CM | POA: Diagnosis not present

## 2016-02-04 DIAGNOSIS — D485 Neoplasm of uncertain behavior of skin: Secondary | ICD-10-CM | POA: Diagnosis not present

## 2016-02-04 DIAGNOSIS — L812 Freckles: Secondary | ICD-10-CM | POA: Diagnosis not present

## 2016-02-04 DIAGNOSIS — D229 Melanocytic nevi, unspecified: Secondary | ICD-10-CM | POA: Diagnosis not present

## 2016-02-04 DIAGNOSIS — L309 Dermatitis, unspecified: Secondary | ICD-10-CM | POA: Diagnosis not present

## 2016-02-04 DIAGNOSIS — Z85820 Personal history of malignant melanoma of skin: Secondary | ICD-10-CM | POA: Diagnosis not present

## 2016-02-09 ENCOUNTER — Inpatient Hospital Stay (HOSPITAL_BASED_OUTPATIENT_CLINIC_OR_DEPARTMENT_OTHER): Payer: Medicare Other | Admitting: Internal Medicine

## 2016-02-09 ENCOUNTER — Inpatient Hospital Stay: Payer: Medicare Other | Attending: Internal Medicine

## 2016-02-09 VITALS — BP 137/67 | HR 61 | Temp 97.7°F | Resp 18 | Wt 255.7 lb

## 2016-02-09 DIAGNOSIS — Z79899 Other long term (current) drug therapy: Secondary | ICD-10-CM | POA: Insufficient documentation

## 2016-02-09 DIAGNOSIS — D61818 Other pancytopenia: Secondary | ICD-10-CM

## 2016-02-09 DIAGNOSIS — K7460 Unspecified cirrhosis of liver: Secondary | ICD-10-CM | POA: Insufficient documentation

## 2016-02-09 DIAGNOSIS — K7469 Other cirrhosis of liver: Secondary | ICD-10-CM

## 2016-02-09 DIAGNOSIS — R161 Splenomegaly, not elsewhere classified: Secondary | ICD-10-CM | POA: Diagnosis not present

## 2016-02-09 DIAGNOSIS — K766 Portal hypertension: Secondary | ICD-10-CM

## 2016-02-09 DIAGNOSIS — M7989 Other specified soft tissue disorders: Secondary | ICD-10-CM | POA: Diagnosis not present

## 2016-02-09 LAB — CBC WITH DIFFERENTIAL/PLATELET
BASOS ABS: 0 10*3/uL (ref 0–0.1)
Basophils Relative: 1 %
EOS PCT: 4 %
Eosinophils Absolute: 0.1 10*3/uL (ref 0–0.7)
HEMATOCRIT: 34.8 % — AB (ref 40.0–52.0)
Hemoglobin: 12.2 g/dL — ABNORMAL LOW (ref 13.0–18.0)
LYMPHS PCT: 25 %
Lymphs Abs: 0.9 10*3/uL — ABNORMAL LOW (ref 1.0–3.6)
MCH: 31 pg (ref 26.0–34.0)
MCHC: 35.2 g/dL (ref 32.0–36.0)
MCV: 88.3 fL (ref 80.0–100.0)
Monocytes Absolute: 0.3 10*3/uL (ref 0.2–1.0)
Monocytes Relative: 8 %
NEUTROS ABS: 2.2 10*3/uL (ref 1.4–6.5)
NEUTROS PCT: 62 %
Platelets: 106 10*3/uL — ABNORMAL LOW (ref 150–440)
RBC: 3.94 MIL/uL — AB (ref 4.40–5.90)
RDW: 14.5 % (ref 11.5–14.5)
WBC: 3.6 10*3/uL — AB (ref 3.8–10.6)

## 2016-02-09 NOTE — Progress Notes (Signed)
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 Patient has intermittent occasional nosebleeds. Otherwise no bleeding tendencies.   His appetite is fair. He has intermittent leg swelling. This is not any worse. Denies any unusual abdominal pain or abdominal swelling.   REVIEW OF SYSTEMS:  A complete 10 point review of system is done which is negative except mentioned above in history of present illness  I have reviewed the past medical history, past surgical history, social history and family history with the patient and they are unchanged from previous note unless stated above.  ALLERGIES:  is allergic to altace.  MEDICATIONS:  Current Outpatient Prescriptions  Medication Sig Dispense Refill  . atenolol (TENORMIN) 100 MG tablet Take 1 tablet (100 mg total) by mouth daily. 90 tablet 4  . glipiZIDE (GLUCOTROL) 5 MG tablet Take 1 tablet (5 mg total) by mouth daily before breakfast. 90 tablet 4  . losartan (COZAAR) 100 MG tablet Take 1 tablet (100 mg total) by mouth daily. 90 tablet 4  . meloxicam (MOBIC) 15 MG tablet Take 1 tablet (15 mg total) by mouth daily. 30 tablet 3  . metFORMIN (GLUCOPHAGE) 500 MG tablet Take 2 tablets (1,000 mg total) by mouth 2 (two) times daily with a meal. 180 tablet 4  . omeprazole (PRILOSEC) 20 MG capsule Take 20 mg by mouth daily. AM    . simvastatin (ZOCOR) 40 MG tablet Take 1 tablet (40 mg total) by mouth daily. 90 tablet 4  . tiZANidine (ZANAFLEX) 4 MG tablet  Reported on 01/13/2016    . triamcinolone cream (KENALOG) 0.1 % Apply 1 application topically daily.  1  . traMADol (ULTRAM) 50 MG tablet Reported on 02/09/2016     No current facility-administered medications for this visit.    PHYSICAL EXAMINATION:   BP 137/67 mmHg  Pulse 61  Temp(Src) 97.7 F (36.5 C) (Tympanic)  Resp 18  Wt 255 lb 11.7 oz (116 kg)  Filed Weights   02/09/16 1537  Weight: 255 lb 11.7 oz (116 kg)    GENERAL: Well-nourished well-developed; Alert, no distress and comfortable.   Accompanied by his wife. EYES: no pallor or icterus OROPHARYNX: no thrush or ulceration; good dentition  NECK: supple, no masses felt LYMPH:  no palpable lymphadenopathy in the cervical, axillary or inguinal regions LUNGS: clear to auscultation and  No wheeze or crackles HEART/CVS: regular rate & rhythm and no murmurs; No lower extremity edema ABDOMEN:abdomen soft, non-tender and normal bowel sounds Musculoskeletal:no cyanosis of digits and no clubbing; positive for splenomegaly  PSYCH: alert & oriented x 3 with fluent speech NEURO: no focal motor/sensory deficits SKIN:  no rashes or significant lesions   LABORATORY DATA:  I have reviewed the data as listed    Component Value Date/Time   NA 143 01/13/2016 1000   K 4.1 01/13/2016 1000   CL 106 01/13/2016 1000   CO2 22 01/13/2016 1000   GLUCOSE 135* 01/13/2016 1000   BUN 23 01/13/2016 1000   CREATININE 1.05 01/13/2016 1000   CALCIUM 9.1 01/13/2016 1000  PROT 6.7 08/13/2015 1139   ALBUMIN 4.0 08/13/2015 1139   AST 52* 08/13/2015 1139   ALT 58* 08/13/2015 1139   ALKPHOS 119* 08/13/2015 1139   BILITOT 0.7 08/13/2015 1139   GFRNONAA 70 01/13/2016 1000   GFRAA 81 01/13/2016 1000    No results found for: SPEP, UPEP  Lab Results  Component Value Date   WBC 3.6* 02/09/2016   NEUTROABS 2.2 02/09/2016   HGB 12.2* 02/09/2016   HCT 34.8* 02/09/2016   MCV 88.3 02/09/2016   PLT 106* 02/09/2016      Chemistry      Component  Value Date/Time   NA 143 01/13/2016 1000   K 4.1 01/13/2016 1000   CL 106 01/13/2016 1000   CO2 22 01/13/2016 1000   BUN 23 01/13/2016 1000   CREATININE 1.05 01/13/2016 1000      Component Value Date/Time   CALCIUM 9.1 01/13/2016 1000   ALKPHOS 119* 08/13/2015 1139   AST 52* 08/13/2015 1139   ALT 58* 08/13/2015 1139   BILITOT 0.7 08/13/2015 1139       ASSESSMENT & PLAN:   # Pancytopenia- Asymptomatic. Today labs white count 3.6 hemoglobin 12.4 platelets of 109. Secondary to cirrhosis/portal hypertension/splenomegaly. Patient continues to be asymptomatic at this time.  # Cirrhosis/portal hypertension- Question Karlene Lineman. Patient follows up with Dr.Wohl; has a ultrasound planned IN MAY/JUNE. Discussed the concerns of liver cancer although small.   # Patient follow-up in November 2017 with labs/AFP.      Cammie Sickle, MD 02/09/2016 3:59 PM

## 2016-02-10 ENCOUNTER — Telehealth: Payer: Self-pay | Admitting: *Deleted

## 2016-02-10 LAB — AFP TUMOR MARKER: AFP TUMOR MARKER: 1.4 ng/mL (ref 0.0–8.3)

## 2016-02-10 NOTE — Telephone Encounter (Signed)
Called patient to inform him his AFP was normal @ 1.4.  Patient verbalized understanding.

## 2016-02-11 DIAGNOSIS — L280 Lichen simplex chronicus: Secondary | ICD-10-CM | POA: Diagnosis not present

## 2016-02-11 DIAGNOSIS — K7460 Unspecified cirrhosis of liver: Secondary | ICD-10-CM | POA: Diagnosis not present

## 2016-02-11 DIAGNOSIS — D649 Anemia, unspecified: Secondary | ICD-10-CM | POA: Diagnosis not present

## 2016-02-11 DIAGNOSIS — L308 Other specified dermatitis: Secondary | ICD-10-CM | POA: Diagnosis not present

## 2016-03-05 ENCOUNTER — Telehealth: Payer: Self-pay

## 2016-03-05 ENCOUNTER — Other Ambulatory Visit: Payer: Self-pay

## 2016-03-05 DIAGNOSIS — K7460 Unspecified cirrhosis of liver: Secondary | ICD-10-CM

## 2016-03-05 NOTE — Telephone Encounter (Signed)
Pt due for 6 months repeat Hepatic function. Contacted pt and he has requested I mailed order to him.

## 2016-03-05 NOTE — Telephone Encounter (Signed)
-----   Message from [place], [place] sent at 10/27/2015  4:46 PM EST ----- Pt needs repeat Hepatic function

## 2016-03-08 DIAGNOSIS — K7460 Unspecified cirrhosis of liver: Secondary | ICD-10-CM | POA: Diagnosis not present

## 2016-03-09 ENCOUNTER — Telehealth: Payer: Self-pay

## 2016-03-09 LAB — HEPATIC FUNCTION PANEL
ALT: 76 IU/L — ABNORMAL HIGH (ref 0–44)
AST: 60 IU/L — ABNORMAL HIGH (ref 0–40)
Albumin: 4.3 g/dL (ref 3.5–4.8)
Alkaline Phosphatase: 90 IU/L (ref 39–117)
BILIRUBIN, DIRECT: 0.23 mg/dL (ref 0.00–0.40)
Bilirubin Total: 0.7 mg/dL (ref 0.0–1.2)
TOTAL PROTEIN: 7 g/dL (ref 6.0–8.5)

## 2016-03-09 NOTE — Telephone Encounter (Signed)
Pt notified of lab results

## 2016-03-09 NOTE — Telephone Encounter (Signed)
-----  Message from Lucilla Lame, MD sent at 03/09/2016  1:10 PM EDT ----- But the patient know that the alk phosphatase was normal but the other enzymes are still elevated. He should continue doing what he is doing with losing weight.

## 2016-03-09 NOTE — Telephone Encounter (Deleted)
-----  Message from Lucilla Lame, MD sent at 03/09/2016  1:10 PM EDT ----- But the patient know that the alk phosphatase was normal but the other enzymes are still elevated. He should continue doing what he is doing with losing weight.

## 2016-03-31 DIAGNOSIS — L810 Postinflammatory hyperpigmentation: Secondary | ICD-10-CM | POA: Diagnosis not present

## 2016-03-31 DIAGNOSIS — L280 Lichen simplex chronicus: Secondary | ICD-10-CM | POA: Diagnosis not present

## 2016-03-31 DIAGNOSIS — L308 Other specified dermatitis: Secondary | ICD-10-CM | POA: Diagnosis not present

## 2016-04-29 ENCOUNTER — Encounter: Payer: Self-pay | Admitting: Family Medicine

## 2016-04-29 ENCOUNTER — Ambulatory Visit (INDEPENDENT_AMBULATORY_CARE_PROVIDER_SITE_OTHER): Payer: Medicare Other | Admitting: Family Medicine

## 2016-04-29 VITALS — BP 129/72 | HR 66 | Temp 98.0°F | Ht 69.2 in | Wt 257.0 lb

## 2016-04-29 DIAGNOSIS — I2510 Atherosclerotic heart disease of native coronary artery without angina pectoris: Secondary | ICD-10-CM

## 2016-04-29 DIAGNOSIS — I10 Essential (primary) hypertension: Secondary | ICD-10-CM | POA: Diagnosis not present

## 2016-04-29 DIAGNOSIS — E119 Type 2 diabetes mellitus without complications: Secondary | ICD-10-CM

## 2016-04-29 LAB — BAYER DCA HB A1C WAIVED: HB A1C: 7.9 % — AB (ref <7.0)

## 2016-04-29 MED ORDER — CANAGLIFLOZIN 300 MG PO TABS: 300.0000 mg | ORAL_TABLET | Freq: Every day | ORAL | Status: DC

## 2016-04-29 NOTE — Progress Notes (Signed)
BP 129/72 mmHg  Pulse 66  Temp(Src) 98 F (36.7 C)  Ht 5' 9.2" (1.758 m)  Wt 257 lb (116.574 kg)  BMI 37.72 kg/m2  SpO2 99%   Subjective:    Patient ID: Mason Wilson., male    DOB: [birth year], 75 y.o.   MRN: [id number]  HPI: Jamerion Zervos is a 75 y.o. male  Chief Complaint  Patient presents with  . Diabetes   Patient presents today for his routine follow up.  DM - Patient is eating two big meals per day right now.  HTN - BPs doing well. No dizziness, CP, or syncope Compliant with medications, no side effects reported.   Relevant past medical, surgical, family and social history reviewed and updated as indicated. Interim medical history since our last visit reviewed. Allergies and medications reviewed and updated.  Review of Systems  Constitutional: Negative.   Respiratory: Negative.   Cardiovascular: Negative.   Endocrine: Negative.   Musculoskeletal: Negative.   Skin: Negative.   Neurological: Negative.   Psychiatric/Behavioral: Negative.     Per HPI unless specifically indicated above     Objective:    BP 129/72 mmHg  Pulse 66  Temp(Src) 98 F (36.7 C)  Ht 5' 9.2" (1.758 m)  Wt 257 lb (116.574 kg)  BMI 37.72 kg/m2  SpO2 99%  Wt Readings from Last 3 Encounters:  04/29/16 257 lb (116.574 kg)  02/09/16 255 lb 11.7 oz (116 kg)  01/13/16 253 lb (114.76 kg)    Physical Exam  Constitutional: He is oriented to person, place, and time. He appears well-developed and well-nourished. No distress.  HENT:  Head: Normocephalic and atraumatic.  Right Ear: Hearing normal.  Left Ear: Hearing normal.  Nose: Nose normal.  Eyes: Conjunctivae and lids are normal. Right eye exhibits no discharge. Left eye exhibits no discharge. No scleral icterus.  Cardiovascular: Normal rate, regular rhythm and normal heart sounds.   Pulmonary/Chest: Effort normal and breath sounds normal. No respiratory distress.  Musculoskeletal: Normal range of motion.  Neurological: He  is alert and oriented to person, place, and time.  Skin: Skin is intact. No rash noted.  Psychiatric: He has a normal mood and affect. His speech is normal and behavior is normal. Judgment and thought content normal. Cognition and memory are normal.    Results for orders placed or performed in visit on 03/05/16  Hepatic function panel  Result Value Ref Range   Total Protein 7.0 6.0 - 8.5 g/dL   Albumin 4.3 3.5 - 4.8 g/dL   Bilirubin Total 0.7 0.0 - 1.2 mg/dL   Bilirubin, Direct 0.23 0.00 - 0.40 mg/dL   Alkaline Phosphatase 90 39 - 117 IU/L   AST 60 (H) 0 - 40 IU/L   ALT 76 (H) 0 - 44 IU/L      Assessment & Plan:   Problem List Items Addressed This Visit      Cardiovascular and Mediastinum   Essential hypertension    The current medical regimen is effective;  continue present plan and medications.       Relevant Medications   hydrochlorothiazide (HYDRODIURIL) 25 MG tablet     Endocrine   Diabetes mellitus without complication ([place]) - Primary    Not under good control. Discussed continuing to improve diet and exercise.  We will also start farxiga discuss side effects and prevention      Relevant Medications   canagliflozin (INVOKANA) 300 MG TABS tablet   Other Relevant Orders  Bayer DCA Hb A1c Waived       Follow up plan: Return in about 3 months (around 07/30/2016) for Physical Exam a1c.

## 2016-04-29 NOTE — Assessment & Plan Note (Signed)
The current medical regimen is effective;  continue present plan and medications.  

## 2016-04-29 NOTE — Assessment & Plan Note (Signed)
Not under good control. Discussed continuing to improve diet and exercise.  We will also start farxiga discuss side effects and prevention

## 2016-05-18 DIAGNOSIS — E113393 Type 2 diabetes mellitus with moderate nonproliferative diabetic retinopathy without macular edema, bilateral: Secondary | ICD-10-CM | POA: Diagnosis not present

## 2016-05-18 LAB — HM DIABETES EYE EXAM

## 2016-05-31 ENCOUNTER — Other Ambulatory Visit: Payer: Self-pay

## 2016-05-31 ENCOUNTER — Telehealth: Payer: Self-pay

## 2016-05-31 DIAGNOSIS — K7460 Unspecified cirrhosis of liver: Secondary | ICD-10-CM

## 2016-05-31 NOTE — Telephone Encounter (Signed)
Pt schedule for his 6 months repeat RUQ abdominal US on Friday, July 28th @ 9:30am at [facility] location. He is aware of date, time, location and prep. Pt also has been notified he needs a repeat LFT's and AFP.

## 2016-05-31 NOTE — Telephone Encounter (Signed)
-----   Message from [place], [place] sent at 10/27/2015  4:47 PM EST ----- Regarding: repeat labs and [place] Pt needs repeat US, LFT's and AFP per last ov of 10/27/15.

## 2016-06-01 ENCOUNTER — Telehealth: Payer: Self-pay

## 2016-06-01 LAB — HEPATIC FUNCTION PANEL
ALBUMIN: 4.1 g/dL (ref 3.5–4.8)
ALT: 54 IU/L — ABNORMAL HIGH (ref 0–44)
AST: 47 IU/L — ABNORMAL HIGH (ref 0–40)
Alkaline Phosphatase: 84 IU/L (ref 39–117)
BILIRUBIN TOTAL: 0.6 mg/dL (ref 0.0–1.2)
BILIRUBIN, DIRECT: 0.23 mg/dL (ref 0.00–0.40)
Total Protein: 6.9 g/dL (ref 6.0–8.5)

## 2016-06-01 LAB — AFP TUMOR MARKER: AFP-Tumor Marker: 1.8 ng/mL (ref 0.0–8.3)

## 2016-06-01 NOTE — Telephone Encounter (Signed)
-----   Message from Lucilla Lame, MD sent at 06/01/2016  7:17 AM EDT ----- Let the patient know the liver tests have come down but not back to normal. Continue with the weight loss and ill follow up in 6 months in the office and 3 months for LFT's.

## 2016-06-01 NOTE — Telephone Encounter (Signed)
Left vm with results on pt's wife cell. Pt has been added to 3 month recall to have LFT's repeated.

## 2016-06-04 ENCOUNTER — Ambulatory Visit
Admission: RE | Admit: 2016-06-04 | Discharge: 2016-06-04 | Disposition: A | Discharge status: Home / Self Care | Payer: Medicare Other | Source: Ambulatory Visit | Attending: Gastroenterology | Admitting: Gastroenterology

## 2016-06-04 DIAGNOSIS — K7460 Unspecified cirrhosis of liver: Secondary | ICD-10-CM | POA: Diagnosis not present

## 2016-06-04 DIAGNOSIS — B1920 Unspecified viral hepatitis C without hepatic coma: Secondary | ICD-10-CM | POA: Diagnosis not present

## 2016-06-10 ENCOUNTER — Encounter: Payer: Self-pay | Admitting: Family Medicine

## 2016-06-10 ENCOUNTER — Ambulatory Visit (INDEPENDENT_AMBULATORY_CARE_PROVIDER_SITE_OTHER): Payer: Medicare Other | Admitting: Family Medicine

## 2016-06-10 VITALS — BP 113/64 | HR 82 | Temp 99.2°F | Wt 249.0 lb

## 2016-06-10 DIAGNOSIS — N390 Urinary tract infection, site not specified: Secondary | ICD-10-CM | POA: Diagnosis not present

## 2016-06-10 DIAGNOSIS — R399 Unspecified symptoms and signs involving the genitourinary system: Secondary | ICD-10-CM | POA: Diagnosis not present

## 2016-06-10 DIAGNOSIS — I2510 Atherosclerotic heart disease of native coronary artery without angina pectoris: Secondary | ICD-10-CM | POA: Diagnosis not present

## 2016-06-10 MED ORDER — SULFAMETHOXAZOLE-TRIMETHOPRIM 800-160 MG PO TABS: 1.0000 | ORAL_TABLET | Freq: Two times a day (BID) | ORAL | 0 refills | Status: DC

## 2016-06-10 NOTE — Progress Notes (Signed)
   BP 113/64   Pulse 82   Temp 99.2 F (37.3 C)   Wt 249 lb (112.9 kg)   SpO2 96%   BMI 36.56 kg/m    Subjective:    Patient ID: Mason Chute., male    DOB: [birth year], 75 y.o.   MRN: [id number]  HPI: Mason Wilson is a 75 y.o. male  Chief Complaint  Patient presents with  . Urinary Tract Infection    x 2-3 days, burning, lots of frequency. only passing a "dribble" at a time. He had chills and aches last night and a low grade temp this morning.    Patient presents with a 3 day history of dysuria, frequency, and suprapubic pressure. Woke up this morning with generalized body aches and a low grade fever. Has been taking tylenol as needed. Hx of UTIs, with most recent one being one year ago. Denies back pain, N/V, or hematuria.   Relevant past medical, surgical, family and social history reviewed and updated as indicated. Interim medical history since our last visit reviewed. Allergies and medications reviewed and updated.  Review of Systems  Constitutional: Positive for fever.  HENT: Negative.   Respiratory: Negative.   Cardiovascular: Negative.   Gastrointestinal: Negative.   Genitourinary: Positive for dysuria, frequency and urgency.  Musculoskeletal: Positive for myalgias.  Neurological: Negative.   Psychiatric/Behavioral: Negative.     Per HPI unless specifically indicated above     Objective:    BP 113/64   Pulse 82   Temp 99.2 F (37.3 C)   Wt 249 lb (112.9 kg)   SpO2 96%   BMI 36.56 kg/m   Wt Readings from Last 3 Encounters:  06/10/16 249 lb (112.9 kg)  04/29/16 257 lb (116.6 kg)  02/09/16 255 lb 11.7 oz (116 kg)    Physical Exam  Constitutional: He is oriented to person, place, and time. He appears well-developed and well-nourished.  HENT:  Head: Atraumatic.  Eyes: Conjunctivae are normal. No scleral icterus.  Neck: Normal range of motion. Neck supple.  Cardiovascular: Normal heart sounds.   Pulmonary/Chest: Effort normal and breath  sounds normal.  Abdominal: Soft. Bowel sounds are normal. There is no tenderness.  Musculoskeletal: Normal range of motion.  No CVA tenderness  Neurological: He is alert and oriented to person, place, and time.  Skin: Skin is warm and dry.  Psychiatric: He has a normal mood and affect. His behavior is normal.  Nursing note and vitals reviewed.       Assessment & Plan:   Problem List Items Addressed This Visit    None    Visit Diagnoses    UTI (lower urinary tract infection)    -  Primary   Bactrim sent. Continue tylenol prn, discussed good hydration and not holding urine for too long. Will await cx and adjust accordingly if needed.   Relevant Medications   sulfamethoxazole-trimethoprim (BACTRIM DS,SEPTRA DS) 800-160 MG tablet   Other Relevant Orders   UA/M w/rflx Culture, Routine (STAT)       Follow up plan: Return if symptoms worsen or fail to improve.

## 2016-06-10 NOTE — Patient Instructions (Signed)
Follow up as needed

## 2016-06-14 LAB — UA/M W/RFLX CULTURE, ROUTINE
Bilirubin, UA: NEGATIVE
Glucose, UA: NEGATIVE
NITRITE UA: POSITIVE — AB
PH UA: 5 (ref 5.0–7.5)
Specific Gravity, UA: 1.02 (ref 1.005–1.030)
UUROB: 1 mg/dL (ref 0.2–1.0)

## 2016-06-14 LAB — URINE CULTURE, REFLEX

## 2016-06-14 LAB — MICROSCOPIC EXAMINATION
RBC, UA: >30 /hpf — AB (ref 0–?)
WBC, UA: >30 /hpf — AB (ref 0–?)

## 2016-06-15 ENCOUNTER — Telehealth: Payer: Self-pay | Admitting: Family Medicine

## 2016-06-15 DIAGNOSIS — N390 Urinary tract infection, site not specified: Secondary | ICD-10-CM

## 2016-06-15 NOTE — Telephone Encounter (Signed)
Please call patient and let him know that I am placing a referral to Urology for him as his urine culture did grow E. Coli and true UTIs are very uncommon for men. His antibiotic did show that it will work against the bacteria so hopefully he is feeling better.

## 2016-06-15 NOTE — Telephone Encounter (Signed)
Patient notified

## 2016-06-16 ENCOUNTER — Telehealth: Payer: Self-pay

## 2016-06-16 NOTE — Telephone Encounter (Signed)
Pt notified of [place] results. Will discuss results with Dr. Stetzer Norris upon his return about the gallbladder findings. Pt is currently doing well with no abdominal pain, nausea, vomiting or pain after he eats. Wife stated he currently has an appt with [facility] for recurrent UTI's. Advised her to contact me if he develops any symptoms.

## 2016-06-16 NOTE — Telephone Encounter (Signed)
-----   Message from Lucilla Lame, MD sent at 06/14/2016  6:33 PM EDT ----- This patient needs to come in to discuss his gallbladder ultrasound findings.

## 2016-06-21 ENCOUNTER — Encounter: Payer: Self-pay | Admitting: Family Medicine

## 2016-06-21 ENCOUNTER — Ambulatory Visit (INDEPENDENT_AMBULATORY_CARE_PROVIDER_SITE_OTHER): Payer: Medicare Other | Admitting: Family Medicine

## 2016-06-21 ENCOUNTER — Other Ambulatory Visit: Payer: Self-pay

## 2016-06-21 ENCOUNTER — Telehealth: Payer: Self-pay

## 2016-06-21 VITALS — BP 120/70 | HR 83 | Temp 99.8°F | Wt 249.0 lb

## 2016-06-21 DIAGNOSIS — R509 Fever, unspecified: Secondary | ICD-10-CM | POA: Diagnosis not present

## 2016-06-21 DIAGNOSIS — I2510 Atherosclerotic heart disease of native coronary artery without angina pectoris: Secondary | ICD-10-CM | POA: Diagnosis not present

## 2016-06-21 LAB — UA/M W/RFLX CULTURE, ROUTINE
BILIRUBIN UA: NEGATIVE
Glucose, UA: NEGATIVE
Leukocytes, UA: NEGATIVE
Nitrite, UA: NEGATIVE
PH UA: 5.5 (ref 5.0–7.5)
RBC, UA: NEGATIVE
Specific Gravity, UA: 1.02 (ref 1.005–1.030)
UUROB: 1 mg/dL (ref 0.2–1.0)

## 2016-06-21 LAB — CBC WITH DIFFERENTIAL/PLATELET
HEMATOCRIT: 32.2 % — AB (ref 37.5–51.0)
HEMOGLOBIN: 11.3 g/dL — AB (ref 12.6–17.7)
LYMPHS ABS: 0.5 10*3/uL — AB (ref 0.7–3.1)
LYMPHS: 13 %
MCH: 31.6 pg (ref 26.6–33.0)
MCHC: 35.1 g/dL (ref 31.5–35.7)
MCV: 90 fL (ref 79–97)
MID (ABSOLUTE): 0.4 10*3/uL (ref 0.1–1.6)
MID: 10 %
NEUTROS PCT: 77 %
Neutrophils Absolute: 3.4 10*3/uL (ref 1.4–7.0)
Platelets: 100 10*3/uL — CL (ref 150–379)
RBC: 3.58 x10E6/uL — ABNORMAL LOW (ref 4.14–5.80)
RDW: 15.3 % (ref 12.3–15.4)
WBC: 4.3 10*3/uL (ref 3.4–10.8)

## 2016-06-21 LAB — VERITOR FLU A/B WAIVED
Influenza A: NEGATIVE
Influenza B: NEGATIVE

## 2016-06-21 MED ORDER — DOXYCYCLINE HYCLATE 100 MG PO TABS
100.0000 mg | ORAL_TABLET | Freq: Two times a day (BID) | ORAL | 0 refills | Status: DC
Start: 2016-06-21 — End: 2016-06-25

## 2016-06-21 NOTE — Progress Notes (Signed)
BP 120/70   Pulse 83   Temp 99.8 F (37.7 C)   Wt 249 lb (112.9 kg)   SpO2 96%   BMI 36.56 kg/m    Subjective:    Patient ID: Mason Chute., male    DOB: [birth year], 75 y.o.   MRN: [id number]  HPI: Mason Wilson is a 75 y.o. male  Chief Complaint  Patient presents with  . Fever    x 3, feels like he's been run over by a truck, body aches. Still has 5 doses of his antibiotic left.  no cough/congestion, no n/v/d.   Patient presents with 3 day history of low grade fevers, joint pains, and malaise. States he feels like he's "been run over by a truck". Currently completing an extended course of bactrim for a UTI, has 5 doses left. Denies urinary symptoms, cough, congestion, N/V/D, abdominal pain, or rash.  Tmax is 100.5. Taking tylenol as needed for the fever. Does report 8-9 tiny ticks on him in June that his wife removed. Does not recall a rash at that time.   Relevant past medical, surgical, family and social history reviewed and updated as indicated. Interim medical history since our last visit reviewed. Allergies and medications reviewed and updated.  Review of Systems  Constitutional: Positive for fatigue and fever.  Respiratory: Negative.  Negative for cough and shortness of breath.   Cardiovascular: Negative.   Gastrointestinal: Negative for abdominal pain, diarrhea, nausea and vomiting.  Genitourinary: Negative.   Musculoskeletal: Positive for arthralgias, back pain and myalgias.  Skin: Negative for rash.  Neurological: Positive for headaches.  Psychiatric/Behavioral: Negative.     Per HPI unless specifically indicated above     Objective:    BP 120/70   Pulse 83   Temp 99.8 F (37.7 C)   Wt 249 lb (112.9 kg)   SpO2 96%   BMI 36.56 kg/m   Wt Readings from Last 3 Encounters:  06/21/16 249 lb (112.9 kg)  06/10/16 249 lb (112.9 kg)  04/29/16 257 lb (116.6 kg)    Physical Exam  Results for orders placed or performed in visit on 06/10/16    Microscopic Examination  Result Value Ref Range   WBC, UA >30 (A) 0 - 5 /hpf   RBC, UA >30 (A) 0 - 2 /hpf   Epithelial Cells (non renal) 0-10 0 - 10 /hpf   Bacteria, UA Moderate (A) None seen/Few  UA/M w/rflx Culture, Routine (STAT)  Result Value Ref Range   Specific Gravity, UA 1.020 1.005 - 1.030   pH, UA 5.0 5.0 - 7.5   Color, UA Orange Yellow   Appearance Ur Cloudy (A) Clear   Leukocytes, UA 3+ (A) Negative   Protein, UA 2+ (A) Negative/Trace   Glucose, UA Negative Negative   Ketones, UA 1+ (A) Negative   RBC, UA 3+ (A) Negative   Bilirubin, UA Negative Negative   Urobilinogen, Ur 1.0 0.2 - 1.0 mg/dL   Nitrite, UA Positive (A) Negative   Microscopic Examination See below:    Urinalysis Reflex Comment   Urine Culture, Routine  Result Value Ref Range   Urine Culture, Routine Final report (A)    Urine Culture result 1 Escherichia coli (A)    ANTIMICROBIAL SUSCEPTIBILITY Comment       Assessment & Plan:   Problem List Items Addressed This Visit    None    Visit Diagnoses    Fever, unspecified fever cause    -  Primary  Relevant Orders   CBC With Differential/Platelet   Veritor Flu A/B Waived   UA/M w/rflx Culture, Routine   Lyme Ab/Western Blot Reflex   [place] mtn spotted fvr ab, IgG-blood   Comprehensive metabolic panel    Flu test and U/A negative for infection, CBC with normal WBC count. Will await tick-bourne illness labs and CMP. Recommended he finish bactrim course and begin course of doxycycline in case tick illness is responsible for symptoms. Also recommended he stay away from tylenol given his liver cirrhosis and only take ibuprofen for his symptoms.  Will adjust tx as needed once labs are back. Pt aware to f/u for changing or worsening symptoms. He is agreeable to plan.   Follow up plan: Return if symptoms worsen or fail to improve.

## 2016-06-21 NOTE — Telephone Encounter (Signed)
-----   Message from Lucilla Lame, MD sent at 06/14/2016  6:33 PM EDT ----- This patient needs to come in to discuss his gallbladder ultrasound findings.

## 2016-06-21 NOTE — Patient Instructions (Signed)
Follow up as needed

## 2016-06-21 NOTE — Telephone Encounter (Signed)
Pt was notified of his [place] results, but this was done to make sure there were no new liver lesions seen. Do you want him to follow up to discuss gallbladder issues? Please advise.

## 2016-06-23 ENCOUNTER — Emergency Department: Payer: Medicare Other

## 2016-06-23 ENCOUNTER — Observation Stay
Admission: EM | Admit: 2016-06-23 | Discharge: 2016-06-25 | Disposition: A | Discharge status: Home / Self Care | Payer: Medicare Other | Attending: Specialist | Admitting: Specialist

## 2016-06-23 ENCOUNTER — Ambulatory Visit: Payer: Self-pay | Admitting: Family Medicine

## 2016-06-23 ENCOUNTER — Encounter: Payer: Self-pay | Admitting: Emergency Medicine

## 2016-06-23 ENCOUNTER — Telehealth: Payer: Self-pay

## 2016-06-23 ENCOUNTER — Telehealth: Payer: Self-pay | Admitting: Family Medicine

## 2016-06-23 DIAGNOSIS — Z87442 Personal history of urinary calculi: Secondary | ICD-10-CM | POA: Insufficient documentation

## 2016-06-23 DIAGNOSIS — E785 Hyperlipidemia, unspecified: Secondary | ICD-10-CM | POA: Insufficient documentation

## 2016-06-23 DIAGNOSIS — N170 Acute kidney failure with tubular necrosis: Secondary | ICD-10-CM | POA: Diagnosis not present

## 2016-06-23 DIAGNOSIS — Z79899 Other long term (current) drug therapy: Secondary | ICD-10-CM | POA: Insufficient documentation

## 2016-06-23 DIAGNOSIS — Z79891 Long term (current) use of opiate analgesic: Secondary | ICD-10-CM | POA: Insufficient documentation

## 2016-06-23 DIAGNOSIS — I2510 Atherosclerotic heart disease of native coronary artery without angina pectoris: Secondary | ICD-10-CM | POA: Diagnosis not present

## 2016-06-23 DIAGNOSIS — Z888 Allergy status to other drugs, medicaments and biological substances status: Secondary | ICD-10-CM | POA: Insufficient documentation

## 2016-06-23 DIAGNOSIS — Z9889 Other specified postprocedural states: Secondary | ICD-10-CM | POA: Insufficient documentation

## 2016-06-23 DIAGNOSIS — N179 Acute kidney failure, unspecified: Secondary | ICD-10-CM | POA: Diagnosis not present

## 2016-06-23 DIAGNOSIS — R945 Abnormal results of liver function studies: Secondary | ICD-10-CM

## 2016-06-23 DIAGNOSIS — Z8043 Family history of malignant neoplasm of testis: Secondary | ICD-10-CM | POA: Insufficient documentation

## 2016-06-23 DIAGNOSIS — Z9841 Cataract extraction status, right eye: Secondary | ICD-10-CM | POA: Insufficient documentation

## 2016-06-23 DIAGNOSIS — Z7984 Long term (current) use of oral hypoglycemic drugs: Secondary | ICD-10-CM | POA: Diagnosis not present

## 2016-06-23 DIAGNOSIS — G4733 Obstructive sleep apnea (adult) (pediatric): Secondary | ICD-10-CM | POA: Diagnosis not present

## 2016-06-23 DIAGNOSIS — K219 Gastro-esophageal reflux disease without esophagitis: Secondary | ICD-10-CM | POA: Diagnosis not present

## 2016-06-23 DIAGNOSIS — I10 Essential (primary) hypertension: Secondary | ICD-10-CM | POA: Diagnosis not present

## 2016-06-23 DIAGNOSIS — I517 Cardiomegaly: Secondary | ICD-10-CM | POA: Insufficient documentation

## 2016-06-23 DIAGNOSIS — Z8349 Family history of other endocrine, nutritional and metabolic diseases: Secondary | ICD-10-CM | POA: Insufficient documentation

## 2016-06-23 DIAGNOSIS — G8929 Other chronic pain: Secondary | ICD-10-CM | POA: Diagnosis not present

## 2016-06-23 DIAGNOSIS — R7989 Other specified abnormal findings of blood chemistry: Secondary | ICD-10-CM | POA: Insufficient documentation

## 2016-06-23 DIAGNOSIS — K7460 Unspecified cirrhosis of liver: Secondary | ICD-10-CM | POA: Diagnosis not present

## 2016-06-23 DIAGNOSIS — A770 Spotted fever due to Rickettsia rickettsii: Secondary | ICD-10-CM | POA: Diagnosis not present

## 2016-06-23 DIAGNOSIS — M1990 Unspecified osteoarthritis, unspecified site: Secondary | ICD-10-CM | POA: Insufficient documentation

## 2016-06-23 DIAGNOSIS — K8010 Calculus of gallbladder with chronic cholecystitis without obstruction: Secondary | ICD-10-CM | POA: Diagnosis not present

## 2016-06-23 DIAGNOSIS — N390 Urinary tract infection, site not specified: Secondary | ICD-10-CM | POA: Diagnosis not present

## 2016-06-23 DIAGNOSIS — F17220 Nicotine dependence, chewing tobacco, uncomplicated: Secondary | ICD-10-CM | POA: Insufficient documentation

## 2016-06-23 DIAGNOSIS — E119 Type 2 diabetes mellitus without complications: Secondary | ICD-10-CM

## 2016-06-23 DIAGNOSIS — Z820 Family history of epilepsy and other diseases of the nervous system: Secondary | ICD-10-CM | POA: Insufficient documentation

## 2016-06-23 DIAGNOSIS — M549 Dorsalgia, unspecified: Secondary | ICD-10-CM | POA: Diagnosis not present

## 2016-06-23 DIAGNOSIS — E860 Dehydration: Secondary | ICD-10-CM | POA: Insufficient documentation

## 2016-06-23 DIAGNOSIS — Z85820 Personal history of malignant melanoma of skin: Secondary | ICD-10-CM | POA: Insufficient documentation

## 2016-06-23 DIAGNOSIS — R509 Fever, unspecified: Secondary | ICD-10-CM | POA: Diagnosis not present

## 2016-06-23 DIAGNOSIS — Z9842 Cataract extraction status, left eye: Secondary | ICD-10-CM | POA: Insufficient documentation

## 2016-06-23 DIAGNOSIS — Z955 Presence of coronary angioplasty implant and graft: Secondary | ICD-10-CM | POA: Insufficient documentation

## 2016-06-23 DIAGNOSIS — B9620 Unspecified Escherichia coli [E. coli] as the cause of diseases classified elsewhere: Secondary | ICD-10-CM | POA: Diagnosis not present

## 2016-06-23 DIAGNOSIS — Z8249 Family history of ischemic heart disease and other diseases of the circulatory system: Secondary | ICD-10-CM | POA: Insufficient documentation

## 2016-06-23 LAB — URINALYSIS COMPLETE WITH MICROSCOPIC (ARMC ONLY)
Bacteria, UA: NONE SEEN
GLUCOSE, UA: NEGATIVE mg/dL
Hgb urine dipstick: NEGATIVE
LEUKOCYTES UA: NEGATIVE
Nitrite: NEGATIVE
PH: 5.5 (ref 5.0–8.0)
PROTEIN: NEGATIVE mg/dL
RBC / HPF: NONE SEEN RBC/hpf (ref 0–5)
Specific Gravity, Urine: 1.025 (ref 1.005–1.030)
WBC, UA: NONE SEEN WBC/hpf (ref 0–5)

## 2016-06-23 LAB — GLUCOSE, CAPILLARY
GLUCOSE-CAPILLARY: 125 mg/dL — AB (ref 65–99)
Glucose-Capillary: 129 mg/dL — ABNORMAL HIGH (ref 65–99)

## 2016-06-23 LAB — CBC
HCT: 31.6 % — ABNORMAL LOW (ref 40.0–52.0)
Hemoglobin: 11 g/dL — ABNORMAL LOW (ref 13.0–18.0)
MCH: 30.5 pg (ref 26.0–34.0)
MCHC: 34.8 g/dL (ref 32.0–36.0)
MCV: 87.6 fL (ref 80.0–100.0)
PLATELETS: 109 10*3/uL — AB (ref 150–440)
RBC: 3.6 MIL/uL — AB (ref 4.40–5.90)
RDW: 15.7 % — AB (ref 11.5–14.5)
WBC: 3.7 10*3/uL — AB (ref 3.8–10.6)

## 2016-06-23 LAB — COMPREHENSIVE METABOLIC PANEL
ALK PHOS: 147 U/L — AB (ref 38–126)
ALT: 116 U/L — AB (ref 17–63)
AST: 103 U/L — ABNORMAL HIGH (ref 15–41)
Albumin: 3.8 g/dL (ref 3.5–5.0)
Anion gap: 8 (ref 5–15)
BILIRUBIN TOTAL: 1.8 mg/dL — AB (ref 0.3–1.2)
BUN: 38 mg/dL — AB (ref 6–20)
CALCIUM: 9.2 mg/dL (ref 8.9–10.3)
CO2: 21 mmol/L — ABNORMAL LOW (ref 22–32)
CREATININE: 1.76 mg/dL — AB (ref 0.61–1.24)
Chloride: 109 mmol/L (ref 101–111)
GFR, EST AFRICAN AMERICAN: 42 mL/min — AB (ref >60)
GFR, EST NON AFRICAN AMERICAN: 37 mL/min — AB (ref >60)
Glucose, Bld: 118 mg/dL — ABNORMAL HIGH (ref 65–99)
Potassium: 4.6 mmol/L (ref 3.5–5.1)
Sodium: 138 mmol/L (ref 135–145)
TOTAL PROTEIN: 7 g/dL (ref 6.5–8.1)

## 2016-06-23 MED ORDER — HYDROCODONE-ACETAMINOPHEN 5-325 MG PO TABS: 1.0000 | ORAL_TABLET | ORAL | Status: DC | PRN

## 2016-06-23 MED ORDER — ATENOLOL 25 MG PO TABS
50.0000 mg | ORAL_TABLET | Freq: Every day | ORAL | Status: DC
  Administered 2016-06-24 – 2016-06-25 (×2): 50 mg via ORAL
  Filled 2016-06-23 (×2): qty 2

## 2016-06-23 MED ORDER — ACETAMINOPHEN 650 MG RE SUPP: 650.0000 mg | Freq: Four times a day (QID) | RECTAL | Status: DC | PRN

## 2016-06-23 MED ORDER — ONDANSETRON HCL 4 MG/2ML IJ SOLN: 4.0000 mg | Freq: Four times a day (QID) | INTRAMUSCULAR | Status: DC | PRN

## 2016-06-23 MED ORDER — SODIUM CHLORIDE 0.9 % IV BOLUS (SEPSIS): 1000.0000 mL | Freq: Once | INTRAVENOUS | Status: DC

## 2016-06-23 MED ORDER — PANTOPRAZOLE SODIUM 40 MG PO TBEC
40.0000 mg | DELAYED_RELEASE_TABLET | Freq: Every day | ORAL | Status: DC
  Administered 2016-06-24 – 2016-06-25 (×2): 40 mg via ORAL
  Filled 2016-06-23 (×2): qty 1

## 2016-06-23 MED ORDER — TIZANIDINE HCL 4 MG PO TABS: 2.0000 mg | ORAL_TABLET | Freq: Four times a day (QID) | ORAL | Status: DC | PRN

## 2016-06-23 MED ORDER — ACETAMINOPHEN 325 MG PO TABS
650.0000 mg | ORAL_TABLET | Freq: Four times a day (QID) | ORAL | Status: DC | PRN
  Administered 2016-06-23 – 2016-06-25 (×3): 650 mg via ORAL
  Filled 2016-06-23 (×3): qty 2

## 2016-06-23 MED ORDER — HEPARIN SODIUM (PORCINE) 5000 UNIT/ML IJ SOLN
5000.0000 [IU] | Freq: Three times a day (TID) | INTRAMUSCULAR | Status: DC
  Administered 2016-06-23 – 2016-06-25 (×6): 5000 [IU] via SUBCUTANEOUS
  Filled 2016-06-23 (×7): qty 1

## 2016-06-23 MED ORDER — BISACODYL 5 MG PO TBEC: 5.0000 mg | DELAYED_RELEASE_TABLET | Freq: Every day | ORAL | Status: DC | PRN

## 2016-06-23 MED ORDER — ATENOLOL 25 MG PO TABS: 100.0000 mg | ORAL_TABLET | Freq: Every day | ORAL | Status: DC

## 2016-06-23 MED ORDER — ONDANSETRON HCL 4 MG PO TABS: 4.0000 mg | ORAL_TABLET | Freq: Four times a day (QID) | ORAL | Status: DC | PRN

## 2016-06-23 MED ORDER — GLIPIZIDE 5 MG PO TABS
5.0000 mg | ORAL_TABLET | Freq: Every day | ORAL | Status: DC
  Administered 2016-06-24 – 2016-06-25 (×2): 5 mg via ORAL
  Filled 2016-06-23 (×3): qty 1

## 2016-06-23 MED ORDER — SODIUM CHLORIDE 0.9 % IV SOLN
INTRAVENOUS | Status: DC
  Administered 2016-06-23 – 2016-06-25 (×3): via INTRAVENOUS

## 2016-06-23 MED ORDER — INSULIN ASPART 100 UNIT/ML ~~LOC~~ SOLN
0.0000 [IU] | Freq: Three times a day (TID) | SUBCUTANEOUS | Status: DC
  Administered 2016-06-23 – 2016-06-24 (×2): 1 [IU] via SUBCUTANEOUS
  Filled 2016-06-23 (×2): qty 1

## 2016-06-23 MED ORDER — SODIUM CHLORIDE 0.9 % IV BOLUS (SEPSIS)
1000.0000 mL | Freq: Once | INTRAVENOUS | Status: AC
  Administered 2016-06-23: 1000 mL via INTRAVENOUS

## 2016-06-23 MED ORDER — LEVOFLOXACIN IN D5W 750 MG/150ML IV SOLN
750.0000 mg | Freq: Once | INTRAVENOUS | Status: AC
  Administered 2016-06-23: 750 mg via INTRAVENOUS
  Filled 2016-06-23: qty 150

## 2016-06-23 MED ORDER — TRAZODONE HCL 50 MG PO TABS: 25.0000 mg | ORAL_TABLET | Freq: Every evening | ORAL | Status: DC | PRN

## 2016-06-23 MED ORDER — DOCUSATE SODIUM 100 MG PO CAPS
100.0000 mg | ORAL_CAPSULE | Freq: Two times a day (BID) | ORAL | Status: DC
  Administered 2016-06-23: 100 mg via ORAL
  Filled 2016-06-23 (×5): qty 1

## 2016-06-23 MED ORDER — DEXTROSE 5 % IV SOLN
1.0000 g | INTRAVENOUS | Status: DC
  Administered 2016-06-23 – 2016-06-24 (×2): 1 g via INTRAVENOUS
  Filled 2016-06-23 (×3): qty 10

## 2016-06-23 NOTE — ED Provider Notes (Signed)
[facility] Emergency Department Provider Note   ____________________________________________   First MD Initiated Contact with Patient 06/23/16 1155     (approximate)  I have reviewed the triage vital signs and the nursing notes.   HISTORY  Chief Complaint Weakness    HPI Mason Wilson. is a 75 y.o. male history of cirrhosis, coronary artery disease, diabetes, GERD, hyperlipidemia resents for evaluation of persistent fevers ongoing for the past week and a half, gradual onset, intermittent, responsive to antipyretics but occurring almost daily. This is in the setting of a recently diagnosed urinary tract infection with culture showing 10,000 colony forming units of Escherichia coli, he was treated with Bactrim as well as doxycycline but has had fever to 102.4F as recently as yesterday evening. He had labs drawn a few days ago and received a phone call today that his kidney and liver function tests were abnormal. He denies any chest pain, no cough, no vomiting or diarrhea.   Past Medical History:  Diagnosis Date  . Allergy   . Arthritis    "everywhere' - worse in back  . Body mass index 39.0-39.9, adult   . CAD (coronary artery disease)   . Chronic back pain   . Diabetes mellitus without complication ([place])   . Elevated liver enzymes   . GERD (gastroesophageal reflux disease)   . Heart murmur   . Hyperlipidemia   . Hypogonadism in male   . Melanoma ([place])    melanoma   . Melanoma in situ of right shoulder ([place])   . Renal stones   . Sleep apnea    CPAP  . Uses hearing aid    doesn't wear    Patient Active Problem List   Diagnosis Date Noted  . Acute renal failure (ARF) ([place]) 06/23/2016  . Essential hypertension 10/13/2015  . Back muscle spasm 09/15/2015  . 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 Hepatic cirrhosis ([place])   . Esophageal varices in cirrhosis ([facility])   . Cirrhosis ([place]) 08/11/2015  . Abnormal tumor  markers 07/28/2015  . Other pancytopenia ([place]) 07/28/2015  . Weight loss 07/28/2015  . 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 Diabetes mellitus without complication ([place]) [id number]  . CAD (coronary artery disease) 07/08/2015  . Chronic kidney disease 07/08/2015  . Hyperlipidemia 07/08/2015  . Body mass index 39.0-39.9, adult 07/08/2015  . Sleep apnea 07/08/2015  . BPH (benign prostatic hyperplasia) 07/08/2015  . Low back pain 07/08/2015    Past Surgical History:  Procedure Laterality Date  . ANGIOPLASTY  1999   3 stents  . CARPAL TUNNEL RELEASE     x2  . CATARACT EXTRACTION, BILATERAL    . ESOPHAGOGASTRODUODENOSCOPY (EGD) WITH PROPOFOL N/A 09/08/2015   Procedure: ESOPHAGOGASTRODUODENOSCOPY (EGD) WITH PROPOFOL;  Surgeon: Lucilla Lame, MD;  Location: [facility];  Service: Endoscopy;  Laterality: N/A;  CPAP Diabetic - oral meds  . LIVER BIOPSY    . SKIN LESION EXCISION  1998  . TONSILLECTOMY     age 65    Prior to Admission medications   Medication Sig Start Date End Date Taking? Authorizing Provider  atenolol (TENORMIN) 100 MG tablet Take 1 tablet (100 mg total) by mouth daily. 10/13/15   [name], MD  clobetasol cream (TEMOVATE) 0.05 %  02/11/16   Historical Provider, MD  doxycycline (VIBRA-TABS) 100 MG tablet Take 1 tablet (100 mg total) by mouth 2 (two) times daily. 06/21/16   Volney American, PA-C  glipiZIDE (GLUCOTROL) 5 MG tablet Take 1 tablet (5 mg total) by mouth daily before breakfast. 07/08/15   [name], MD  hydrochlorothiazide (HYDRODIURIL) 25 MG tablet 25 mg daily. 04/13/16   Historical Provider, MD  losartan (COZAAR) 100 MG tablet Take 1 tablet (100 mg total) by mouth daily. 07/08/15   [name], MD  metFORMIN (GLUCOPHAGE) 500 MG tablet Take 2 tablets (1,000 mg total) by mouth 2 (two) times daily with a meal. 07/08/15   [name], MD  omeprazole (PRILOSEC) 20 MG capsule Take 20 mg by mouth daily. AM    Historical Provider, MD   simvastatin (ZOCOR) 40 MG tablet Take 1 tablet (40 mg total) by mouth daily. 07/08/15   [name], MD  sulfamethoxazole-trimethoprim (BACTRIM DS,SEPTRA DS) 800-160 MG tablet Take 1 tablet by mouth 2 (two) times daily. 06/10/16   Volney American, PA-C  tiZANidine (ZANAFLEX) 4 MG tablet Reported on 01/13/2016 07/22/15   Historical Provider, MD  traMADol Veatrice Bourbon) 50 MG tablet Reported on 02/09/2016 07/22/15   Historical Provider, MD    Allergies Altace [ramipril]  Family History  Problem Relation Age of Onset  . Thyroid disease Mother   . Alzheimer's disease Mother   . Heart disease Mother   . Cancer Father 62    testicular  . Heart disease Father   . Hypertension Father   . Heart attack Father     Social History Social History  Substance Use Topics  . Smoking status: Former Smoker    Types: Cigarettes    Quit date: 04/02/1970  . Smokeless tobacco: Current User    Types: Chew     Comment: 1973  . Alcohol use No     Comment: stopped in 1996    Review of Systems Constitutional: + fever/chills Eyes: No visual changes. ENT: No sore throat. Cardiovascular: Denies chest pain. Respiratory: Denies shortness of breath. Gastrointestinal: No abdominal pain.  No nausea, no vomiting.  No diarrhea.  No constipation. Genitourinary: Negative for dysuria. Musculoskeletal: Negative for back pain. Skin: Negative for rash. Neurological: Negative for headaches, focal weakness or numbness.  10-point ROS otherwise negative.  ____________________________________________   PHYSICAL EXAM:  Vitals:   06/23/16 1037 06/23/16 1200 06/23/16 1300 06/23/16 1400  BP: (!) 102/50 (!) 98/52 (!) 96/50 (!) 106/56  Pulse: 71 64 63 66  Resp: 17 14 (!) 21 (!) 26  Temp: 98.5 F (36.9 C)     TempSrc: Oral     SpO2: 97% 98% 97% 98%  Weight:      Height:        VITAL SIGNS: ED Triage Vitals  Enc Vitals Group     BP 06/23/16 1037 (!) 102/50     Pulse Rate 06/23/16 1037 71     Resp 06/23/16  1037 17     Temp 06/23/16 1037 98.5 F (36.9 C)     Temp Source 06/23/16 1037 Oral     SpO2 06/23/16 1037 97 %     Weight 06/23/16 1033 247 lb (112 kg)     Height 06/23/16 1033 5\' 10"  (1.778 m)     Head Circumference --      Peak Flow --      Pain Score 06/23/16 1033 0     Pain Loc --      Pain Edu? --      Excl. in [place]? --     Constitutional: Alert and oriented. Well appearing and in no acute distress. Eyes: Conjunctivae are  normal. PERRL. EOMI. Head: Atraumatic. Nose: No congestion/rhinnorhea. Mouth/Throat: Mucous membranes are moist.  Oropharynx non-erythematous. Neck: No stridor.  Supple without meningismus. Cardiovascular: Normal rate, regular rhythm. Grossly normal heart sounds.  Good peripheral circulation. Respiratory: Normal respiratory effort.  No retractions. Lungs CTAB. Gastrointestinal: Soft and nontender. No distention.  No CVA tenderness. Genitourinary: deferred Musculoskeletal: No lower extremity tenderness nor edema.  No joint effusions. Neurologic:  Normal speech and language. No gross focal neurologic deficits are appreciated. No gait instability. Skin:  Skin is warm, dry and intact. No rash noted. Psychiatric: Mood and affect are normal. Speech and behavior are normal.  ____________________________________________   LABS (all labs ordered are listed, but only abnormal results are displayed)  Labs Reviewed  URINALYSIS COMPLETEWITH MICROSCOPIC ([facility] ONLY) - Abnormal; Notable for the following:       Result Value   Color, Urine ORANGE (*)    APPearance CLEAR (*)    Bilirubin Urine 2+ (*)    Ketones, ur TRACE (*)    Squamous Epithelial / LPF 0-5 (*)    All other components within normal limits  COMPREHENSIVE METABOLIC PANEL - Abnormal; Notable for the following:    CO2 21 (*)    Glucose, Bld 118 (*)    BUN 38 (*)    Creatinine, Ser 1.76 (*)    AST 103 (*)    ALT 116 (*)    Alkaline Phosphatase 147 (*)    Total Bilirubin 1.8 (*)    GFR calc non Af  Amer 37 (*)    GFR calc Af Amer 42 (*)    All other components within normal limits  CBC - Abnormal; Notable for the following:    WBC 3.7 (*)    RBC 3.60 (*)    Hemoglobin 11.0 (*)    HCT 31.6 (*)    RDW 15.7 (*)    Platelets 109 (*)    All other components within normal limits  CULTURE, BLOOD (ROUTINE X 2)  CULTURE, BLOOD (ROUTINE X 2)  URINE CULTURE  CBG MONITORING, ED   ____________________________________________  EKG  ED ECG REPORT I, Joanne Gavel, the attending physician, personally viewed and interpreted this ECG.   Date: 06/23/2016  EKG Time: 10:37  Rate: 72  Rhythm: normal sinus rhythm  Axis: normal  Intervals:right bundle branch block  ST&T Change: No acute ST elevation or acute ST depression.  ____________________________________________  RADIOLOGY  none ____________________________________________   PROCEDURES  Procedure(s) performed: None  Procedures  Critical Care performed: No  ____________________________________________   INITIAL IMPRESSION / ASSESSMENT AND PLAN / ED COURSE  Pertinent labs & imaging results that were available during my care of the patient were reviewed by me and considered in my medical decision making (see chart for details).  Mason Wilson. is a 75 y.o. male history of cirrhosis, coronary artery disease, diabetes, GERD, hyperlipidemia resents for evaluation of persistent fevers ongoing for the past week and a half, gradual onset, intermittent, responsive to antipyretics but occurring almost daily. On exam, he is generally well-appearing and in no acute distress. He has had hypotensive with blood pressure 96/50 initially, now improving to 106/56. He has a benign abdominal exam. I reviewed his labs, CBC shows pancytopenia but his values are chronically low and generally unchanged from prior. CMP shows creatinine elevation at 1.76, which is doubled/increasing appearing from his baseline of 0.7, his GFR is only 37 today.  AST, ALT, alkaline phosphatase and T bili are all mildly elevated. Urinalysis is not  currently consist with infection however he has been treated with antibiotics and so at this point, I would go based on his prior culture as opposed to the urinalysis today. Given his persistent fevers, acute kidney injury, elevation of his LFTs, known UTI with cultures growing Escherichia coli, we'll give IV Levaquin forwhich cultures were sensitive, IV fluids, and I have discussed the case with the hospitalist for admission.  Clinical Course     ____________________________________________   FINAL CLINICAL IMPRESSION(S) / ED DIAGNOSES  Final diagnoses:  AKI (acute kidney injury) ([place])  LFT elevation      NEW MEDICATIONS STARTED DURING THIS VISIT:  New Prescriptions   No medications on file     Note:  This document was prepared using Dragon voice recognition software and may include unintentional dictation errors.    Joanne Gavel, MD 06/23/16 1440

## 2016-06-23 NOTE — ED Triage Notes (Signed)
Pt to ed with c/o weakness and fever since August 3rd after being dx with UTI.  Pt also reports abnormal liver and kidney function tests.

## 2016-06-23 NOTE — Telephone Encounter (Signed)
Patient isn't any better, still running a fever. They will come in this am for an appt.

## 2016-06-23 NOTE — Telephone Encounter (Signed)
Please call pt and ask him to come by for a repeat CMP today at his earliest convenience to check on his kidneys - if he is no better, may want to get him on the schedule for an OV but if he's improving, just the lab.

## 2016-06-23 NOTE — ED Notes (Signed)
Wife states intermittent fevers for the past few weeks, states he is being tx at home with abx for a UTI, states he had blood work done at PCP yesterday and was told he has abnormal liver and kidney function test, pt arrives awake and alert, wife at bedside, denies any pain but states dizziness

## 2016-06-23 NOTE — H&P (Signed)
[facility] [place] NAME: Mason Wilson    MR#:  [id number]  DATE OF BIRTH:  16-Feb-1941  DATE OF ADMISSION:  06/23/2016  PRIMARY CARE PHYSICIAN: Golden Pop, MD   REQUESTING/REFERRING PHYSICIAN:Dr.Gayle  CHIEF COMPLAINT:   Chief Complaint  Patient presents with  . Weakness    HISTORY OF PRESENT ILLNESS:  Mason Wilson  is a 75 y.o. male with a known history of htn ,DMII.hlp.non alcoholic liver cirrhosis . Because of persistent fevers since August 3. Patient temperature ranges from 100  To 102 F since Aug 3rd/patient is prescribed doxycycline for 14 days, Bactrim for 14 days.  By pcp,Patient is supposed to finish tomorrow. Wife brought him today because of persistent fevers. Patient to urine culture showed greater than 100 000 colonies of Escherichia coli. Also given doxycycline because of possible tick borne fever. Lyme serology is negative.RMSF serology  is pending.Shortness of breath, no cough. No abdominal pain or diarrhea or vomiting. Appetite is normal. Wife removed a dozen of ticks from  His body 2 weeks ago and they were alive.  He Did not have any joint pains or erythema migrans.  PAST MEDICAL HISTORY:   Past Medical History:  Diagnosis Date  . Allergy   . Arthritis    "everywhere' - worse in back  . Body mass index 39.0-39.9, adult   . CAD (coronary artery disease)   . Chronic back pain   . Diabetes mellitus without complication ([place])   . Elevated liver enzymes   . GERD (gastroesophageal reflux disease)   . Heart murmur   . Hyperlipidemia   . Hypogonadism in male   . Melanoma ([place])    melanoma   . Melanoma in situ of right shoulder ([place])   . Renal stones   . Sleep apnea    CPAP  . Uses hearing aid    doesn't wear    PAST SURGICAL HISTOIRY:   Past Surgical History:  Procedure Laterality Date  . ANGIOPLASTY  1999   3 stents  . CARPAL TUNNEL RELEASE     x2  . CATARACT EXTRACTION, BILATERAL    .  ESOPHAGOGASTRODUODENOSCOPY (EGD) WITH PROPOFOL N/A 09/08/2015   Procedure: ESOPHAGOGASTRODUODENOSCOPY (EGD) WITH PROPOFOL;  Surgeon: Lucilla Lame, MD;  Location: [facility];  Service: Endoscopy;  Laterality: N/A;  CPAP Diabetic - oral meds  . LIVER BIOPSY    . SKIN LESION EXCISION  1998  . TONSILLECTOMY     age 89    SOCIAL HISTORY:   Social History  Substance Use Topics  . Smoking status: Former Smoker    Types: Cigarettes    Quit date: 04/02/1970  . Smokeless tobacco: Current User    Types: Chew     Comment: 1974  . Alcohol use No     Comment: stopped in 1996    FAMILY HISTORY:   Family History  Problem Relation Age of Onset  . Thyroid disease Mother   . Alzheimer's disease Mother   . Heart disease Mother   . Cancer Father 63    testicular  . Heart disease Father   . Hypertension Father   . Heart attack Father     DRUG ALLERGIES:   Allergies  Allergen Reactions  . Altace [Ramipril] Cough    REVIEW OF SYSTEMS:  CONSTITUTIONAL:Recent fevers for 2 weeks. EYES: No blurred or double vision.  EARS, NOSE, AND THROAT: No tinnitus or ear pain.  RESPIRATORY: No cough, shortness of breath, wheezing  or hemoptysis.  CARDIOVASCULAR: No chest pain, orthopnea, edema.  GASTROINTESTINAL: No nausea, vomiting, diarrhea or abdominal pain.  GENITOURINARY: No dysuria, hematuria.  ENDOCRINE: No polyuria, nocturia,  HEMATOLOGY: No anemia, easy bruising or bleeding SKIN: No rash or lesion. MUSCULOSKELETAL: No joint pain or arthritis.   NEUROLOGIC: No tingling, numbness, weakness.  PSYCHIATRY: No anxiety or depression.   MEDICATIONS AT HOME:   Prior to Admission medications   Medication Sig Start Date End Date Taking? Authorizing Provider  atenolol (TENORMIN) 100 MG tablet Take 1 tablet (100 mg total) by mouth daily. 10/13/15  Yes [name], MD  clobetasol cream (TEMOVATE) 0.05 %  02/11/16  Yes Historical Provider, MD  CRANBERRY EXTRACT PO Take by mouth.   Yes  Historical Provider, MD  doxycycline (VIBRA-TABS) 100 MG tablet Take 1 tablet (100 mg total) by mouth 2 (two) times daily. 06/21/16  Yes Volney American, PA-C  glipiZIDE (GLUCOTROL) 5 MG tablet Take 1 tablet (5 mg total) by mouth daily before breakfast. 07/08/15  Yes [name], MD  hydrochlorothiazide (HYDRODIURIL) 25 MG tablet Take 25 mg by mouth daily.  04/13/16  Yes Historical Provider, MD  losartan (COZAAR) 100 MG tablet Take 1 tablet (100 mg total) by mouth daily. 07/08/15  Yes [name], MD  metFORMIN (GLUCOPHAGE) 500 MG tablet Take 2 tablets (1,000 mg total) by mouth 2 (two) times daily with a meal. 07/08/15  Yes [name], MD  omeprazole (PRILOSEC) 20 MG capsule Take 20 mg by mouth daily. AM   Yes Historical Provider, MD  simvastatin (ZOCOR) 40 MG tablet Take 1 tablet (40 mg total) by mouth daily. 07/08/15  Yes [name], MD  sulfamethoxazole-trimethoprim (BACTRIM DS,SEPTRA DS) 800-160 MG tablet Take 1 tablet by mouth 2 (two) times daily. 06/10/16  Yes Volney American, PA-C  tiZANidine (ZANAFLEX) 4 MG tablet Reported on 01/13/2016 07/22/15  Yes Historical Provider, MD  traMADol Veatrice Bourbon) 50 MG tablet Reported on 02/09/2016 07/22/15  Yes Historical Provider, MD      VITAL SIGNS:  Blood pressure (!) 106/56, pulse 66, temperature 98.5 F (36.9 C), temperature source Oral, resp. rate (!) 26, height 5\' 10"  (1.778 m), weight 112 kg (247 lb), SpO2 98 %.  PHYSICAL EXAMINATION:  GENERAL:  75 y.o.-year-old patient lying in the bed with no acute distress.  EYES: Pupils equal, round, reactive to light and accommodation.  Slight scleral icterus. Extraocular muscles intact.  HEENT: Head atraumatic, normocephalic. Oropharynx and nasopharynx clear.  NECK:  Supple, no jugular venous distention. No thyroid enlargement, no tenderness.  LUNGS: Normal breath sounds bilaterally, no wheezing, rales,rhonchi or crepitation. No use of accessory muscles of respiration.  CARDIOVASCULAR: S1,  S2 normal. No murmurs, rubs, or gallops.  ABDOMEN: Soft, nontender, nondistended. Bowel sounds present. No organomegaly or mass.  EXTREMITIES: No pedal edema, cyanosis, or clubbing.  NEUROLOGIC: Cranial nerves II through XII are intact. Muscle strength 5/5 in all extremities. Sensation intact. Gait not checked.  PSYCHIATRIC: The patient is alert and oriented x 3.  SKIN: No obvious rash, lesion, or ulcer.   LABORATORY PANEL:   CBC  Recent Labs Lab 06/23/16 1040  WBC 3.7*  HGB 11.0*  HCT 31.6*  PLT 109*   ------------------------------------------------------------------------------------------------------------------  Chemistries   Recent Labs Lab 06/23/16 1040  NA 138  K 4.6  CL 109  CO2 21*  GLUCOSE 118*  BUN 38*  CREATININE 1.76*  CALCIUM 9.2  AST 103*  ALT 116*  ALKPHOS 147*  BILITOT 1.8*   ------------------------------------------------------------------------------------------------------------------  Cardiac Enzymes No results for input(s): TROPONINI in the last 168 hours. ------------------------------------------------------------------------------------------------------------------  RADIOLOGY:  Dg Chest 1 View  Result Date: 06/23/2016 CLINICAL DATA:  Two days of unexplained fever, no current cardiopulmonary complaints; history of coronary artery disease and stent placement, diabetes, remote history of smoking. EXAM: CHEST 1 VIEW COMPARISON:  Report of a chest x-ray of April 24, 1998 FINDINGS: The lungs are adequately inflated and clear. The cardiac silhouette is mildly enlarged. The central pulmonary vascularity is prominent without definite cephalization of the vascular pattern. There is no alveolar infiltrate or pleural effusion. The observed bony thorax is unremarkable. IMPRESSION: Mild cardiomegaly and central pulmonary vascular prominence. No pulmonary edema or pneumonia. Electronically Signed   By: David  Martinique M.D.   On: 06/23/2016 14:47    EKG:    Orders placed or performed during the hospital encounter of 06/23/16  . ED EKG  . ED EKG  sinus rhythm  ,no ST-T changes  IMPRESSION AND PLAN:  #39 75 year old male patient with the persistent fever likely secondary to Escherichia coli UTI, and admit him to hospitalist service start on IV Rocephin at this time. Consult ID because of persistent fever, elevated LFTs also. #2 acute renal failure with ATN secondary to fever, dehydration, Bactrim induced. Hold the metformin, losartan, Bactrim, start gentle hydration. And recheck the kidney function tomorrow. #3/elevated LFTs without  abdominal pain. Patient follows up with Dr. Ollen Bowl for his history of nonalcoholic liver cirrhosis, had ultrasound of abdomen done recently showed a calculus cholecystitis. Will consult surgery regarding this. Hold the simvastatin at this time. #4 essential hypertension: Blood pressure borderline. Hold the losartan, his CTZ, decreased the dose of atenolol/ GI, DVT prophylaxis.  All the records are reviewed and case discussed with ED provider. Management plans discussed with the patient, family and they are in agreement.  CODE STATUS: full  TOTAL TIME TAKING CARE OF THIS PATIENT: 31minutes.    Epifanio Lesches M.D on 06/23/2016 at 2:54 PM  Between 7am to 6pm - Pager - [phone]  After 6pm go to [email or web address] - password [email or web address] [facility]  Office  [phone]  CC: Primary care physician; Golden Pop, MD  Note: This dictation was prepared with Dragon dictation along with smaller phrase technology. Any transcriptional errors that result from this process are unintentional.

## 2016-06-23 NOTE — Telephone Encounter (Signed)
After discussing with Mason Wilson and Dr. Wynetta Emery that the patient was not improving and still running a fever, it was decided that patient needed to go to the ED for further eval. Patient's wife notified and she agreed with the plan.

## 2016-06-23 NOTE — ED Notes (Signed)
Pt resting in bed, family at bedside, pt eyes closed, resp even and unlabored

## 2016-06-24 ENCOUNTER — Telehealth: Payer: Self-pay | Admitting: Family Medicine

## 2016-06-24 DIAGNOSIS — R509 Fever, unspecified: Secondary | ICD-10-CM | POA: Diagnosis not present

## 2016-06-24 DIAGNOSIS — N170 Acute kidney failure with tubular necrosis: Secondary | ICD-10-CM | POA: Diagnosis not present

## 2016-06-24 DIAGNOSIS — N390 Urinary tract infection, site not specified: Secondary | ICD-10-CM | POA: Diagnosis not present

## 2016-06-24 DIAGNOSIS — A770 Spotted fever due to Rickettsia rickettsii: Secondary | ICD-10-CM | POA: Diagnosis not present

## 2016-06-24 DIAGNOSIS — R7989 Other specified abnormal findings of blood chemistry: Secondary | ICD-10-CM | POA: Diagnosis not present

## 2016-06-24 DIAGNOSIS — R945 Abnormal results of liver function studies: Secondary | ICD-10-CM

## 2016-06-24 LAB — COMPREHENSIVE METABOLIC PANEL
ALBUMIN: 3.8 g/dL (ref 3.5–4.8)
ALK PHOS: 113 IU/L (ref 39–117)
ALT: 64 IU/L — ABNORMAL HIGH (ref 0–44)
ALT: 90 U/L — AB (ref 17–63)
ANION GAP: 7 (ref 5–15)
AST: 52 IU/L — ABNORMAL HIGH (ref 0–40)
AST: 68 U/L — ABNORMAL HIGH (ref 15–41)
Albumin/Globulin Ratio: 1.5 (ref 1.2–2.2)
Albumin: 3.2 g/dL — ABNORMAL LOW (ref 3.5–5.0)
Alkaline Phosphatase: 127 U/L — ABNORMAL HIGH (ref 38–126)
BUN / CREAT RATIO: 21 (ref 10–24)
BUN: 34 mg/dL — AB (ref 8–27)
BUN: 30 mg/dL — ABNORMAL HIGH (ref 6–20)
Bilirubin Total: 1.3 mg/dL — ABNORMAL HIGH (ref 0.0–1.2)
CALCIUM: 9.3 mg/dL (ref 8.6–10.2)
CHLORIDE: 108 mmol/L (ref 101–111)
CO2: 16 mmol/L — AB (ref 18–29)
CO2: 21 mmol/L — AB (ref 22–32)
CREATININE: 1.61 mg/dL — AB (ref 0.76–1.27)
CREATININE: 1.18 mg/dL (ref 0.61–1.24)
Calcium: 8.5 mg/dL — ABNORMAL LOW (ref 8.9–10.3)
Chloride: 100 mmol/L (ref 96–106)
GFR, EST AFRICAN AMERICAN: 48 mL/min/{1.73_m2} — AB (ref >59)
GFR, EST NON AFRICAN AMERICAN: 42 mL/min/{1.73_m2} — AB (ref >59)
GFR, EST NON AFRICAN AMERICAN: 59 mL/min — AB (ref >60)
GLOBULIN, TOTAL: 2.6 g/dL (ref 1.5–4.5)
GLUCOSE: 135 mg/dL — AB (ref 65–99)
Glucose, Bld: 109 mg/dL — ABNORMAL HIGH (ref 65–99)
Potassium: 4.2 mmol/L (ref 3.5–5.2)
Potassium: 4.2 mmol/L (ref 3.5–5.1)
SODIUM: 137 mmol/L (ref 134–144)
SODIUM: 136 mmol/L (ref 135–145)
TOTAL PROTEIN: 6.4 g/dL (ref 6.0–8.5)
Total Bilirubin: 1.9 mg/dL — ABNORMAL HIGH (ref 0.3–1.2)
Total Protein: 6.1 g/dL — ABNORMAL LOW (ref 6.5–8.1)

## 2016-06-24 LAB — URINE CULTURE: Culture: NO GROWTH

## 2016-06-24 LAB — RAPID HIV SCREEN (HIV 1/2 AB+AG)
HIV 1/2 Antibodies: NONREACTIVE
HIV-1 P24 Antigen - HIV24: NONREACTIVE

## 2016-06-24 LAB — GLUCOSE, CAPILLARY
GLUCOSE-CAPILLARY: 108 mg/dL — AB (ref 65–99)
GLUCOSE-CAPILLARY: 122 mg/dL — AB (ref 65–99)
Glucose-Capillary: 113 mg/dL — ABNORMAL HIGH (ref 65–99)
Glucose-Capillary: 136 mg/dL — ABNORMAL HIGH (ref 65–99)

## 2016-06-24 LAB — LYME AB/WESTERN BLOT REFLEX
LYME DISEASE AB, QUANT, IGM: <0.8 index (ref 0.00–0.79)
Lyme IgG/IgM Ab: <0.91 {ISR} (ref 0.00–0.90)

## 2016-06-24 LAB — CBC
HCT: 27.7 % — ABNORMAL LOW (ref 40.0–52.0)
Hemoglobin: 9.8 g/dL — ABNORMAL LOW (ref 13.0–18.0)
MCH: 30.9 pg (ref 26.0–34.0)
MCHC: 35.4 g/dL (ref 32.0–36.0)
MCV: 87.3 fL (ref 80.0–100.0)
PLATELETS: 104 10*3/uL — AB (ref 150–440)
RBC: 3.17 MIL/uL — AB (ref 4.40–5.90)
RDW: 15.7 % — AB (ref 11.5–14.5)
WBC: 3.5 10*3/uL — ABNORMAL LOW (ref 3.8–10.6)

## 2016-06-24 LAB — RMSF, IGG, IFA

## 2016-06-24 LAB — ROCKY MTN SPOTTED FVR AB, IGG-BLOOD: RMSF IGG: POSITIVE — AB

## 2016-06-24 MED ORDER — DOXYCYCLINE HYCLATE 100 MG IV SOLR
100.0000 mg | Freq: Two times a day (BID) | INTRAVENOUS | Status: DC
  Administered 2016-06-24 – 2016-06-25 (×3): 100 mg via INTRAVENOUS
  Filled 2016-06-24 (×4): qty 100

## 2016-06-24 NOTE — Progress Notes (Signed)
[facility] [facility] called to imform that pt was tested on Monday for [facility] Spotted fever.  This results came back to them this morning and pt was positive.  Dr. Verdell Carmine made aware, new orders to be placed.  ID consult pending.  Clarise Cruz, RN

## 2016-06-24 NOTE — Telephone Encounter (Signed)
Patient was admitted from ED yesterday afternoon. Discussed with Amy to call hospitalist at [facility] to notify them that RMSF came back this morning as positive so they can adjust therapy accordingly. She spoke with the floor nurse who in turn informed patient's provider of the results.

## 2016-06-24 NOTE — Care Management Obs Status (Signed)
[facility] NOTIFICATION   Patient Details  Name: Mason Wilson. MRN: [id number] Date of Birth: July 30, 1941   Medicare Observation Status Notification Given:  Yes    Shelbie Ammons, RN 06/24/2016, 8:47 AM

## 2016-06-24 NOTE — Care Management (Signed)
Admitted to [facility] with the diagnosis of acute renal under observation status. Lives with wife, [name] [phone] or [phone]), Last was in Dr. Rance Muir office 06/21/16. No home health. No skilled facility. No home oxygen. Uses C-PAP in the home x 16 years. No falls. Good appetite. Takes care of all basic and instrumenta activities of daily living himself, drives. Prescriptions are filled at [facility] on [address]. Wife will transport. Shelbie Ammons RN MSN CCM Care Management [phone]

## 2016-06-24 NOTE — Progress Notes (Signed)
[place] at [place] NAME: Mason Wilson    MR#:  [id number]  DATE OF BIRTH:  12/04/1940  SUBJECTIVE:   Is here due to fevers for 2 weeks. Patient's [facility] spotted fever titer was positive. He was also treated for a UTI recently. No complaints presently.  REVIEW OF SYSTEMS:    Review of Systems  Constitutional: Positive for fever. Negative for chills.  HENT: Negative for congestion and tinnitus.   Eyes: Negative for blurred vision and double vision.  Respiratory: Negative for cough, shortness of breath and wheezing.   Cardiovascular: Negative for chest pain, orthopnea and PND.  Gastrointestinal: Negative for abdominal pain, diarrhea, nausea and vomiting.  Genitourinary: Negative for dysuria and hematuria.  Neurological: Negative for dizziness, sensory change and focal weakness.  All other systems reviewed and are negative.   Nutrition: Heart Healthy Tolerating Diet: Yes Tolerating PT: Ambulatory   DRUG ALLERGIES:   Allergies  Allergen Reactions  . Altace [Ramipril] Cough    VITALS:  Blood pressure (!) 112/52, pulse 71, temperature 98.1 F (36.7 C), temperature source Oral, resp. rate 18, height 5\' 10"  (1.778 m), weight 120.7 kg (266 lb), SpO2 99 %.  PHYSICAL EXAMINATION:   Physical Exam  GENERAL:  75 y.o.-year-old patient lying in the bed in no acute distress.  EYES: Pupils equal, round, reactive to light and accommodation. No scleral icterus. Extraocular muscles intact.  HEENT: Head atraumatic, normocephalic. Oropharynx and nasopharynx clear.  NECK:  Supple, no jugular venous distention. No thyroid enlargement, no tenderness.  LUNGS: Normal breath sounds bilaterally, no wheezing, rales, rhonchi. No use of accessory muscles of respiration.  CARDIOVASCULAR: S1, S2 normal. No murmurs, rubs, or gallops.  ABDOMEN: Soft, nontender, nondistended. Bowel sounds present. No organomegaly or mass.  EXTREMITIES: No cyanosis,  clubbing or edema b/l.    NEUROLOGIC: Cranial nerves II through XII are intact. No focal Motor or sensory deficits b/l.   PSYCHIATRIC: The patient is alert and oriented x 3.  SKIN: No obvious rash, lesion, or ulcer.    LABORATORY PANEL:   CBC  Recent Labs Lab 06/24/16 0312  WBC 3.5*  HGB 9.8*  HCT 27.7*  PLT 104*   ------------------------------------------------------------------------------------------------------------------  Chemistries   Recent Labs Lab 06/24/16 0312  NA 136  K 4.2  CL 108  CO2 21*  GLUCOSE 109*  BUN 30*  CREATININE 1.18  CALCIUM 8.5*  AST 68*  ALT 90*  ALKPHOS 127*  BILITOT 1.9*   ------------------------------------------------------------------------------------------------------------------  Cardiac Enzymes No results for input(s): TROPONINI in the last 168 hours. ------------------------------------------------------------------------------------------------------------------  RADIOLOGY:  Dg Chest 1 View  Result Date: 06/23/2016 CLINICAL DATA:  Two days of unexplained fever, no current cardiopulmonary complaints; history of coronary artery disease and stent placement, diabetes, remote history of smoking. EXAM: CHEST 1 VIEW COMPARISON:  Report of a chest x-ray of April 24, 1998 FINDINGS: The lungs are adequately inflated and clear. The cardiac silhouette is mildly enlarged. The central pulmonary vascularity is prominent without definite cephalization of the vascular pattern. There is no alveolar infiltrate or pleural effusion. The observed bony thorax is unremarkable. IMPRESSION: Mild cardiomegaly and central pulmonary vascular prominence. No pulmonary edema or pneumonia. Electronically Signed   By: David  Martinique M.D.   On: 06/23/2016 14:47     ASSESSMENT AND PLAN:   75 year old male with past medical history of obstructive sleep apnea, hyperlipidemia, GERD, diabetes, coronary artery disease, history of nephrolithiasis who presents to the  hospital due to fevers.  1. Persistent fevers-due to suspected [facility] spotted fever. This is the likely diagnosis. -Patient's RMSF IgG titer as an outpatient was elevated. As per the wife she did remove multiple ticks off of him about a week or so back. She does not have a rash, and he is clinically stable. -Continue IV doxycycline. Await infectious disease evaluation.  2. Urinary tract infection-patient was noted to have a Escherichia coli UTI as an outpatient. He has been adequately treated with Bactrim. Continue empiric ceftriaxone for now and await ID input. Blood cultures negative so far.  3. Leukopenia/abnormal LFTs-this is secondary to the RMSF. -We'll follow counts and LFTs which are improving.  4. Essential hypertension-continue atenolol.  5. Diabetes type 2, dictation-continue sliding scale and some.  6. GERD-continue Protonix.  All the records are reviewed and case discussed with Care Management/Social Worker. Management plans discussed with the patient, family and they are in agreement.  CODE STATUS: Full   DVT Prophylaxis: Ambulatory  TOTAL TIME TAKING CARE OF THIS PATIENT: 30 minutes.   POSSIBLE D/C IN 1-2 DAYS, DEPENDING ON CLINICAL CONDITION.   Henreitta Leber M.D on 06/24/2016 at 1:15 PM  Between 7am to 6pm - Pager - [phone]  After 6pm go to [email or web address] - Proofreader  [facility]  Office  [phone]  CC: Primary care physician; Golden Pop, MD

## 2016-06-24 NOTE — Consult Note (Signed)
Jquan Fagley. is a 75 y.o. male  admitted with fever and history of nonalcoholic hepatitis.  HPI: The patient is been admitted for evaluation of a persistent fever without explanation. He does have history of nonalcoholic hepatitis biopsy confirmed. He did have some mildly elevated liver function studies. Ultrasound to evaluate his liver demonstrated a thickened gallbladder wall without evidence of gallstones. Because of his persistent fevers and thickened gallbladder wall the surgical service was consulted.  He denies any history of abdominal pain nausea vomiting indigestion heartburn or change in bowel function. He's not had any history of biliary evaluation prior to this workup. He's not had any previous abdominal surgery. He's not had a history of ascites and has had no previous abdominal drainage procedures.  Past Medical History:  Diagnosis Date  . Allergy   . Arthritis    "everywhere' - worse in back  . Body mass index 39.0-39.9, adult   . CAD (coronary artery disease)   . Chronic back pain   . Diabetes mellitus without complication ([place])   . Elevated liver enzymes   . GERD (gastroesophageal reflux disease)   . Heart murmur   . Hyperlipidemia   . Hypogonadism in male   . Melanoma ([place])    melanoma   . Melanoma in situ of right shoulder ([place])   . Renal stones   . Sleep apnea    CPAP  . Uses hearing aid    doesn't wear   Past Surgical History:  Procedure Laterality Date  . ANGIOPLASTY  1999   3 stents  . CARPAL TUNNEL RELEASE     x2  . CATARACT EXTRACTION, BILATERAL    . ESOPHAGOGASTRODUODENOSCOPY (EGD) WITH PROPOFOL N/A 09/08/2015   Procedure: ESOPHAGOGASTRODUODENOSCOPY (EGD) WITH PROPOFOL;  Surgeon: Lucilla Lame, MD;  Location: [facility];  Service: Endoscopy;  Laterality: N/A;  CPAP Diabetic - oral meds  . LIVER BIOPSY    . SKIN LESION EXCISION  1998  . TONSILLECTOMY     age 59   Social History   Social History  . Marital status: Married    Spouse  name: N/A  . Number of children: N/A  . Years of education: N/A   Social History Main Topics  . Smoking status: Former Smoker    Types: Cigarettes    Quit date: 04/02/1970  . Smokeless tobacco: Current User    Types: Chew     Comment: 1974  . Alcohol use No     Comment: stopped in 1996  . Drug use: No  . Sexual activity: Yes    Partners: Female   Other Topics Concern  . None   Social History Narrative  . None    Review of Systems: Review of Systems  Constitutional: Positive for fever and malaise/fatigue. Negative for chills and weight loss.  HENT: Negative.   Eyes: Negative.   Respiratory: Negative for cough, shortness of breath and wheezing.   Cardiovascular: Negative for chest pain, palpitations and orthopnea.  Gastrointestinal: Negative for abdominal pain, constipation, diarrhea, heartburn, nausea and vomiting.  Genitourinary: Negative.   Musculoskeletal: Negative.   Skin: Negative.   Neurological: Negative for dizziness and tingling.  Psychiatric/Behavioral: Negative.     PHYSICAL EXAM: BP (!) 125/58 (BP Location: Right Arm)   Pulse 76   Temp 99.1 F (37.3 C) (Oral)   Resp 18   Ht 5\' 10"  (1.778 m)   Wt 120.7 kg (266 lb)   SpO2 96%   BMI 38.17 kg/m   Physical  Exam  Constitutional: He is oriented to person, place, and time. He appears well-developed and well-nourished. No distress.  HENT:  Head: Normocephalic and atraumatic.  Eyes: EOM are normal. Pupils are equal, round, and reactive to light.  Neck: Normal range of motion. Neck supple.  Cardiovascular: Normal rate and regular rhythm.   Pulmonary/Chest: Effort normal and breath sounds normal. No respiratory distress. He has no wheezes.  Abdominal: Soft. Bowel sounds are normal. There is no tenderness. There is no rebound and no guarding.  Musculoskeletal: Normal range of motion. He exhibits no edema or deformity.  Neurological: He is alert and oriented to person, place, and time.  Skin: Skin is warm and  dry. He is not diaphoretic.  Psychiatric: He has a normal mood and affect. His behavior is normal.   Symptoms soft mildly distended without a fluid wave. He has no rebound or guarding. I cannot palpate his liver edge. He has no abdominal tenderness.  Impression/Plan: He does appear to have thickened gallbladder wall on ultrasound without evidence of stones. While acalculous cholecystitis is an option he is currently asymptomatic with regard to GI problems. He has no nausea vomiting and abdominal pain tenderness anything to suggest abdominal source for his problem. With his liver history his gallbladder wall thickening could be related to his liver disease. I suppose it is possible that his long-standing fever is related to undiagnosed cholecystitis but the present time I do not see any surgical indications. We will continue to follow him while he is hospitalized. I discussed this plan with the patient and his family.   Dia Crawford III, MD  06/24/2016, 10:17 AM

## 2016-06-25 DIAGNOSIS — N390 Urinary tract infection, site not specified: Secondary | ICD-10-CM | POA: Diagnosis not present

## 2016-06-25 DIAGNOSIS — A770 Spotted fever due to Rickettsia rickettsii: Secondary | ICD-10-CM | POA: Diagnosis not present

## 2016-06-25 DIAGNOSIS — R7989 Other specified abnormal findings of blood chemistry: Secondary | ICD-10-CM | POA: Diagnosis not present

## 2016-06-25 DIAGNOSIS — N170 Acute kidney failure with tubular necrosis: Secondary | ICD-10-CM | POA: Diagnosis not present

## 2016-06-25 DIAGNOSIS — R509 Fever, unspecified: Secondary | ICD-10-CM | POA: Diagnosis not present

## 2016-06-25 LAB — COMPREHENSIVE METABOLIC PANEL
ALK PHOS: 126 U/L (ref 38–126)
ALT: 82 U/L — AB (ref 17–63)
AST: 59 U/L — AB (ref 15–41)
Albumin: 3.4 g/dL — ABNORMAL LOW (ref 3.5–5.0)
Anion gap: 5 (ref 5–15)
BUN: 22 mg/dL — AB (ref 6–20)
CALCIUM: 8.8 mg/dL — AB (ref 8.9–10.3)
CHLORIDE: 108 mmol/L (ref 101–111)
CO2: 23 mmol/L (ref 22–32)
CREATININE: 1.02 mg/dL (ref 0.61–1.24)
GFR calc non Af Amer: >60 mL/min (ref >60)
Glucose, Bld: 140 mg/dL — ABNORMAL HIGH (ref 65–99)
Potassium: 4.2 mmol/L (ref 3.5–5.1)
SODIUM: 136 mmol/L (ref 135–145)
Total Bilirubin: 1.3 mg/dL — ABNORMAL HIGH (ref 0.3–1.2)
Total Protein: 6.4 g/dL — ABNORMAL LOW (ref 6.5–8.1)

## 2016-06-25 LAB — CBC
HCT: 28.3 % — ABNORMAL LOW (ref 40.0–52.0)
HEMOGLOBIN: 10 g/dL — AB (ref 13.0–18.0)
MCH: 31.1 pg (ref 26.0–34.0)
MCHC: 35.5 g/dL (ref 32.0–36.0)
MCV: 87.6 fL (ref 80.0–100.0)
Platelets: 114 10*3/uL — ABNORMAL LOW (ref 150–440)
RBC: 3.23 MIL/uL — AB (ref 4.40–5.90)
RDW: 16.5 % — ABNORMAL HIGH (ref 11.5–14.5)
WBC: 3.3 10*3/uL — AB (ref 3.8–10.6)

## 2016-06-25 LAB — GLUCOSE, CAPILLARY: Glucose-Capillary: 119 mg/dL — ABNORMAL HIGH (ref 65–99)

## 2016-06-25 MED ORDER — DOXYCYCLINE HYCLATE 100 MG PO TABS: 100.0000 mg | ORAL_TABLET | Freq: Two times a day (BID) | ORAL | 0 refills | Status: DC

## 2016-06-25 NOTE — Progress Notes (Signed)
Diagnosis noted.  Will sign off

## 2016-06-25 NOTE — Care Management (Signed)
Discharge today per Dr. Charisse Klinefelter. No follow-up needs identified. Wife will transport. Shelbie Ammons RN MSN CCM Care Management [phone]

## 2016-06-25 NOTE — Progress Notes (Signed)
Received Md order to discharge patient to home, reviewed home medications discharge instructions, follow up appointment and prescriptions with patient and wife and both verbalized understanding discharged by nursing staff wheelchair to home

## 2016-06-25 NOTE — Discharge Summary (Signed)
[facility] NAME: Mason Wilson    MR#:  [id number]  DATE OF BIRTH:  06-Dec-1940  DATE OF ADMISSION:  06/23/2016 ADMITTING PHYSICIAN: Epifanio Lesches, MD  DATE OF DISCHARGE: 06/25/2016  PRIMARY CARE PHYSICIAN: Golden Pop, MD    ADMISSION DIAGNOSIS:  Fever [R50.9] AKI (acute kidney injury) ([place]) [N17.9] LFT elevation [R79.89]  DISCHARGE DIAGNOSIS:  Active Problems:   Acute renal failure (ARF) (HCC)   LFT elevation   SECONDARY DIAGNOSIS:   Past Medical History:  Diagnosis Date  . Allergy   . Arthritis    "everywhere' - worse in back  . Body mass index 39.0-39.9, adult   . CAD (coronary artery disease)   . Chronic back pain   . Diabetes mellitus without complication ([place])   . Elevated liver enzymes   . GERD (gastroesophageal reflux disease)   . Heart murmur   . Hyperlipidemia   . Hypogonadism in male   . Melanoma ([place])    melanoma   . Melanoma in situ of right shoulder ([place])   . Renal stones   . Sleep apnea    CPAP  . Uses hearing aid    doesn't wear    HOSPITAL COURSE:   75 year old male with past medical history of obstructive sleep apnea, hyperlipidemia, GERD, diabetes, coronary artery disease, history of nephrolithiasis who presents to the hospital due to fevers.  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  Patient's RMSF IgG antibody was positive as an outpatient. - pt. was seen by infectious disease and started on IV doxycycline, he has improved and now being discharged on oral doxycycline. Will follow up with infectious disease as an outpatient..  2. Urinary tract infection-patient was noted to have a Escherichia coli UTI as an outpatient.  - his urine cultures have been (-) here and UA was normal.  He has been adequately treated.   3. Leukopenia/abnormal LFTs-this was secondary to the RMSF. - his counts have improved and they can be further followed as outpatient.   4. Essential  hypertension- he will continue atenolol.  5. Diabetes type 2, without complication - pt. Will resume his Glipizide, Metformin.   6. GERD- pt. Will resume his Omeprazole.    DISCHARGE CONDITIONS:   Stable.   CONSULTS OBTAINED:  Treatment Team:  Dia Crawford III, MD  DRUG ALLERGIES:   Allergies  Allergen Reactions  . Altace [Ramipril] Cough    DISCHARGE MEDICATIONS:     Medication List    STOP taking these medications   sulfamethoxazole-trimethoprim 800-160 MG tablet Commonly known as:  BACTRIM DS,SEPTRA DS     TAKE these medications   atenolol 100 MG tablet Commonly known as:  TENORMIN Take 1 tablet (100 mg total) by mouth daily.   clobetasol cream 0.05 % Commonly known as:  TEMOVATE   CRANBERRY EXTRACT PO Take by mouth.   doxycycline 100 MG tablet Commonly known as:  VIBRA-TABS Take 1 tablet (100 mg total) by mouth 2 (two) times daily.   glipiZIDE 5 MG tablet Commonly known as:  GLUCOTROL Take 1 tablet (5 mg total) by mouth daily before breakfast.   hydrochlorothiazide 25 MG tablet Commonly known as:  HYDRODIURIL Take 25 mg by mouth daily.   losartan 100 MG tablet Commonly known as:  COZAAR Take 1 tablet (100 mg total) by mouth daily.   metFORMIN 500 MG tablet Commonly known as:  GLUCOPHAGE Take 2 tablets (1,000 mg total) by mouth 2 (two)  times daily with a meal.   omeprazole 20 MG capsule Commonly known as:  PRILOSEC Take 20 mg by mouth daily. AM   simvastatin 40 MG tablet Commonly known as:  ZOCOR Take 1 tablet (40 mg total) by mouth daily.   tiZANidine 4 MG tablet Commonly known as:  ZANAFLEX Reported on 01/13/2016   traMADol 50 MG tablet Commonly known as:  ULTRAM Reported on 02/09/2016         DISCHARGE INSTRUCTIONS:   DIET:  Cardiac diet  Diabetic   DISCHARGE CONDITION:  Stable  ACTIVITY:  Activity as tolerated  OXYGEN:  Home Oxygen: No.   Oxygen Delivery: room air  DISCHARGE LOCATION:  home   If you experience  worsening of your admission symptoms, develop shortness of breath, life threatening emergency, suicidal or homicidal thoughts you must seek medical attention immediately by calling 911 or calling your MD immediately  if symptoms less severe.  You Must read complete instructions/literature along with all the possible adverse reactions/side effects for all the Medicines you take and that have been prescribed to you. Take any new Medicines after you have completely understood and accpet all the possible adverse reactions/side effects.   Please note  You were cared for by a hospitalist during your hospital stay. If you have any questions about your discharge medications or the care you received while you were in the hospital after you are discharged, you can call the unit and asked to speak with the hospitalist on call if the hospitalist that took care of you is not available. Once you are discharged, your primary care physician will handle any further medical issues. Please note that NO REFILLS for any discharge medications will be authorized once you are discharged, as it is imperative that you return to your primary care physician (or establish a relationship with a primary care physician if you do not have one) for your aftercare needs so that they can reassess your need for medications and monitor your lab values.     Today   No complaints presently.  Fever resolved.  D/c home today.   VITAL SIGNS:  Blood pressure (!) 137/55, pulse 67, temperature 97.9 F (36.6 C), temperature source Oral, resp. rate 16, height 5\' 10"  (1.778 m), weight 120.7 kg (266 lb), SpO2 98 %.  I/O:   Intake/Output Summary (Last 24 hours) at 06/25/16 1327 Last data filed at 06/25/16 0900  Gross per 24 hour  Intake              530 ml  Output                0 ml  Net              530 ml    PHYSICAL EXAMINATION:  GENERAL:  75 y.o.-year-old patient lying in the bed in no acute distress.  EYES: Pupils equal, round,  reactive to light and accommodation. No scleral icterus. Extraocular muscles intact.  HEENT: Head atraumatic, normocephalic. Oropharynx and nasopharynx clear.  NECK:  Supple, no jugular venous distention. No thyroid enlargement, no tenderness.  LUNGS: Normal breath sounds bilaterally, no wheezing, rales,rhonchi. No use of accessory muscles of respiration.  CARDIOVASCULAR: S1, S2 normal. No murmurs, rubs, or gallops.  ABDOMEN: Soft, non-tender, non-distended. Bowel sounds present. No organomegaly or mass.  EXTREMITIES: No pedal edema, cyanosis, or clubbing.  NEUROLOGIC: Cranial nerves II through XII are intact. No focal motor or sensory defecits b/l.  PSYCHIATRIC: The patient is alert and oriented x  3. Good affect.  SKIN: No obvious rash, lesion, or ulcer.   DATA REVIEW:   CBC  Recent Labs Lab 06/25/16 0305  WBC 3.3*  HGB 10.0*  HCT 28.3*  PLT 114*    Chemistries   Recent Labs Lab 06/25/16 0305  NA 136  K 4.2  CL 108  CO2 23  GLUCOSE 140*  BUN 22*  CREATININE 1.02  CALCIUM 8.8*  AST 59*  ALT 82*  ALKPHOS 126  BILITOT 1.3*    Cardiac Enzymes No results for input(s): TROPONINI in the last 168 hours.  Microbiology Results  Results for orders placed or performed during the hospital encounter of 06/23/16  Urine culture     Status: None   Collection Time: 06/23/16 10:40 AM  Result Value Ref Range Status   Specimen Description URINE, CLEAN CATCH  Final   Special Requests NONE  Final   Culture NO GROWTH Performed at [facility]   Final   Report Status 06/24/2016 FINAL  Final  Blood culture (routine x 2)     Status: None (Preliminary result)   Collection Time: 06/23/16  2:18 PM  Result Value Ref Range Status   Specimen Description BLOOD LEFT ASSIST CONTROL  Final   Special Requests   Final    BOTTLES DRAWN AEROBIC AND ANAEROBIC [place]   Culture NO GROWTH 2 DAYS  Final   Report Status PENDING  Incomplete  Blood culture (routine x 2)     Status:  None (Preliminary result)   Collection Time: 06/23/16  2:18 PM  Result Value Ref Range Status   Specimen Description BLOOD RIGHT ASSIST CONTROL  Final   Special Requests BOTTLES DRAWN AEROBIC AND ANAEROBIC [place]  Final   Culture NO GROWTH 2 DAYS  Final   Report Status PENDING  Incomplete    RADIOLOGY:  Dg Chest 1 View  Result Date: 06/23/2016 CLINICAL DATA:  Two days of unexplained fever, no current cardiopulmonary complaints; history of coronary artery disease and stent placement, diabetes, remote history of smoking. EXAM: CHEST 1 VIEW COMPARISON:  Report of a chest x-ray of April 24, 1998 FINDINGS: The lungs are adequately inflated and clear. The cardiac silhouette is mildly enlarged. The central pulmonary vascularity is prominent without definite cephalization of the vascular pattern. There is no alveolar infiltrate or pleural effusion. The observed bony thorax is unremarkable. IMPRESSION: Mild cardiomegaly and central pulmonary vascular prominence. No pulmonary edema or pneumonia. Electronically Signed   By: David  Martinique M.D.   On: 06/23/2016 14:47      Management plans discussed with the patient, family and they are in agreement.  CODE STATUS:     Code Status Orders        Start     Ordered   06/23/16 1426  Full code  Continuous     06/23/16 1427    Code Status History    Date Active Date Inactive Code Status Order ID Comments User Context   This patient has a current code status but no historical code status.    Advance Directive Documentation   Flowsheet Row Most Recent Value  Type of Advance Directive  Healthcare Power of Attorney, Living will  Pre-existing out of facility DNR order (yellow form or pink MOST form)  No data  "MOST" Form in Place?  No data      TOTAL TIME TAKING CARE OF THIS PATIENT: 40 minutes.    Henreitta Leber M.D on 06/25/2016 at 1:27 PM  Between 7am to 6pm -  Pager - [phone]  After 6pm go to [email or web address] - Solicitor  [facility]  Office  [phone]  CC: Primary care physician; Golden Pop, MD

## 2016-06-25 NOTE — Consult Note (Signed)
[facility] Infectious Disease     Reason for Consult: Fever, increased LFTs  Referring Physician: Jones Broom Date of Admission:  06/23/2016   Active Problems:   Acute renal failure (ARF) ([place])   LFT elevation   HPI: Mason Wilson. is a 75 y.o. male admitted 8/16 with febrile illness for approx 2 weeks. Seen in PCP and dxed with UTI and started on bactrim (UCX grew E coli S to bactrim).  His fevers persisted however and seen in fu ad referred to hospital. Had started doxycycline a few days prior to admission.  On admisison he had mild elevated lfts and cr, and no fevers. Clinically feels a little better Did travel to [place] a few weeks ago and removed multiple ticks.   Past Medical History:  Diagnosis Date  . Allergy   . Arthritis    "everywhere' - worse in back  . Body mass index 39.0-39.9, adult   . CAD (coronary artery disease)   . Chronic back pain   . Diabetes mellitus without complication ([place])   . Elevated liver enzymes   . GERD (gastroesophageal reflux disease)   . Heart murmur   . Hyperlipidemia   . Hypogonadism in male   . Melanoma ([place])    melanoma   . Melanoma in situ of right shoulder ([place])   . Renal stones   . Sleep apnea    CPAP  . Uses hearing aid    doesn't wear   Past Surgical History:  Procedure Laterality Date  . ANGIOPLASTY  1999   3 stents  . CARPAL TUNNEL RELEASE     x2  . CATARACT EXTRACTION, BILATERAL    . ESOPHAGOGASTRODUODENOSCOPY (EGD) WITH PROPOFOL N/A 09/08/2015   Procedure: ESOPHAGOGASTRODUODENOSCOPY (EGD) WITH PROPOFOL;  Surgeon: Lucilla Lame, MD;  Location: [place];  Service: Endoscopy;  Laterality: N/A;  CPAP Diabetic - oral meds  . LIVER BIOPSY    . SKIN LESION EXCISION  1998  . TONSILLECTOMY     age 18   Social History  Substance Use Topics  . Smoking status: Former Smoker    Types: Cigarettes    Quit date: 04/02/1970  . Smokeless tobacco: Current User    Types: Chew     Comment: 1974  . Alcohol use No      Comment: stopped in 1996   Family History  Problem Relation Age of Onset  . Thyroid disease Mother   . Alzheimer's disease Mother   . Heart disease Mother   . Cancer Father 31    testicular  . Heart disease Father   . Hypertension Father   . Heart attack Father     Allergies:  Allergies  Allergen Reactions  . Altace [Ramipril] Cough    Current antibiotics: Antibiotics Given (last 72 hours)    Date/Time Action Medication Dose Rate   06/23/16 1651 Given   cefTRIAXone (ROCEPHIN) 1 g in dextrose 5 % 50 mL IVPB 1 g 100 mL/hr   06/24/16 0942 Given   doxycycline (VIBRAMYCIN) 100 mg in dextrose 5 % 250 mL IVPB 100 mg 125 mL/hr   06/24/16 1655 Given   cefTRIAXone (ROCEPHIN) 1 g in dextrose 5 % 50 mL IVPB 1 g 100 mL/hr   06/24/16 2104 Given   doxycycline (VIBRAMYCIN) 100 mg in dextrose 5 % 250 mL IVPB 100 mg 125 mL/hr      MEDICATIONS: . atenolol  50 mg Oral Daily  . cefTRIAXone (ROCEPHIN)  IV  1 g Intravenous Q24H  .  docusate sodium  100 mg Oral BID  . doxycycline (VIBRAMYCIN) IV  100 mg Intravenous Q12H  . glipiZIDE  5 mg Oral QAC breakfast  . heparin  5,000 Units Subcutaneous Q8H  . insulin aspart  0-9 Units Subcutaneous TID WC  . pantoprazole  40 mg Oral Daily    Review of Systems - 11 systems reviewed and negative per HPI   OBJECTIVE: Temp:  [98.1 F (36.7 C)-98.7 F (37.1 C)] 98.7 F (37.1 C) (08/18 0428) Pulse Rate:  [37-78] 72 (08/18 0429) Resp:  [18] 18 (08/17 2006) BP: (112-132)/(52-57) 118/57 (08/18 0428) SpO2:  [95 %-99 %] 97 % (08/18 0429)  Physical Exam  Constitutional: He is oriented to person, place, and time. He appears well-developed and well-nourished. No distress. obese  HENT: anicteric Mouth/Throat: Oropharynx is clear and dry. No oropharyngeal exudate.  Cardiovascular: Normal rate, regular rhythm and normal heart sounds. Exam reveals no gallop and no friction rub.  No murmur heard.  Pulmonary/Chest: Effort normal and breath sounds  normal. No respiratory distress. He has no wheezes.  Abdominal: Soft. Bowel sounds are normal. He exhibits no distension. There is no tenderness.  Lymphadenopathy:  He has no cervical adenopathy.  Neurological: He is alert and oriented to person, place, and time.  Skin: Skin is warm and dry. No rash noted. No erythema.  Psychiatric: He has a normal mood and affect. His behavior is normal.    LABS: Results for orders placed or performed during the hospital encounter of 06/23/16 (from the past 48 hour(s))  Urinalysis complete, with microscopic     Status: Abnormal   Collection Time: 06/23/16 10:40 AM  Result Value Ref Range   Color, Urine ORANGE (A) YELLOW   APPearance CLEAR (A) CLEAR   Glucose, UA NEGATIVE NEGATIVE mg/dL   Bilirubin Urine 2+ (A) NEGATIVE   Ketones, ur TRACE (A) NEGATIVE mg/dL   Specific Gravity, Urine 1.025 1.005 - 1.030   Hgb urine dipstick NEGATIVE NEGATIVE   pH 5.5 5.0 - 8.0   Protein, ur NEGATIVE NEGATIVE mg/dL   Nitrite NEGATIVE NEGATIVE   Leukocytes, UA NEGATIVE NEGATIVE   RBC / HPF NONE SEEN 0 - 5 RBC/hpf   WBC, UA NONE SEEN 0 - 5 WBC/hpf   Bacteria, UA NONE SEEN NONE SEEN   Squamous Epithelial / LPF 0-5 (A) NONE SEEN   Mucous PRESENT   Comprehensive metabolic panel     Status: Abnormal   Collection Time: 06/23/16 10:40 AM  Result Value Ref Range   Sodium 138 135 - 145 mmol/L   Potassium 4.6 3.5 - 5.1 mmol/L   Chloride 109 101 - 111 mmol/L   CO2 21 (L) 22 - 32 mmol/L   Glucose, Bld 118 (H) 65 - 99 mg/dL   BUN 38 (H) 6 - 20 mg/dL   Creatinine, Ser 1.76 (H) 0.61 - 1.24 mg/dL   Calcium 9.2 8.9 - 10.3 mg/dL   Total Protein 7.0 6.5 - 8.1 g/dL   Albumin 3.8 3.5 - 5.0 g/dL   AST 103 (H) 15 - 41 U/L   ALT 116 (H) 17 - 63 U/L   Alkaline Phosphatase 147 (H) 38 - 126 U/L   Total Bilirubin 1.8 (H) 0.3 - 1.2 mg/dL   GFR calc non Af Amer 37 (L) >60 mL/min   GFR calc Af Amer 42 (L) >60 mL/min    Comment: (NOTE) The eGFR has been calculated using the CKD EPI  equation. This calculation has not been validated in all clinical situations.  eGFR's persistently <60 mL/min signify possible Chronic Kidney Disease.    Anion gap 8 5 - 15  CBC     Status: Abnormal   Collection Time: 06/23/16 10:40 AM  Result Value Ref Range   WBC 3.7 (L) 3.8 - 10.6 K/uL   RBC 3.60 (L) 4.40 - 5.90 MIL/uL   Hemoglobin 11.0 (L) 13.0 - 18.0 g/dL   HCT 31.6 (L) 40.0 - 52.0 %   MCV 87.6 80.0 - 100.0 fL   MCH 30.5 26.0 - 34.0 pg   MCHC 34.8 32.0 - 36.0 g/dL   RDW 15.7 (H) 11.5 - 14.5 %   Platelets 109 (L) 150 - 440 K/uL  Urine culture     Status: None   Collection Time: 06/23/16 10:40 AM  Result Value Ref Range   Specimen Description URINE, CLEAN CATCH    Special Requests NONE    Culture NO GROWTH Performed at [facility]     Report Status 06/24/2016 FINAL   Blood culture (routine x 2)     Status: None (Preliminary result)   Collection Time: 06/23/16  2:18 PM  Result Value Ref Range   Specimen Description BLOOD LEFT ASSIST CONTROL    Special Requests      BOTTLES DRAWN AEROBIC AND ANAEROBIC [id number],5CCANA   Culture NO GROWTH 2 DAYS    Report Status PENDING   Blood culture (routine x 2)     Status: None (Preliminary result)   Collection Time: 06/23/16  2:18 PM  Result Value Ref Range   Specimen Description BLOOD RIGHT ASSIST CONTROL    Special Requests BOTTLES DRAWN AEROBIC AND ANAEROBIC [place]    Culture NO GROWTH 2 DAYS    Report Status PENDING   Glucose, capillary     Status: Abnormal   Collection Time: 06/23/16  4:37 PM  Result Value Ref Range   Glucose-Capillary 125 (H) 65 - 99 mg/dL  Glucose, capillary     Status: Abnormal   Collection Time: 06/23/16  9:02 PM  Result Value Ref Range   Glucose-Capillary 129 (H) 65 - 99 mg/dL  CBC     Status: Abnormal   Collection Time: 06/24/16  3:12 AM  Result Value Ref Range   WBC 3.5 (L) 3.8 - 10.6 K/uL   RBC 3.17 (L) 4.40 - 5.90 MIL/uL   Hemoglobin 9.8 (L) 13.0 - 18.0 g/dL   HCT 27.7 (L) 40.0  - 52.0 %   MCV 87.3 80.0 - 100.0 fL   MCH 30.9 26.0 - 34.0 pg   MCHC 35.4 32.0 - 36.0 g/dL   RDW 15.7 (H) 11.5 - 14.5 %   Platelets 104 (L) 150 - 440 K/uL  Comprehensive metabolic panel     Status: Abnormal   Collection Time: 06/24/16  3:12 AM  Result Value Ref Range   Sodium 136 135 - 145 mmol/L   Potassium 4.2 3.5 - 5.1 mmol/L   Chloride 108 101 - 111 mmol/L   CO2 21 (L) 22 - 32 mmol/L   Glucose, Bld 109 (H) 65 - 99 mg/dL   BUN 30 (H) 6 - 20 mg/dL   Creatinine, Ser 1.18 0.61 - 1.24 mg/dL   Calcium 8.5 (L) 8.9 - 10.3 mg/dL   Total Protein 6.1 (L) 6.5 - 8.1 g/dL   Albumin 3.2 (L) 3.5 - 5.0 g/dL   AST 68 (H) 15 - 41 U/L   ALT 90 (H) 17 - 63 U/L   Alkaline Phosphatase 127 (H) 38 - 126 U/L  Total Bilirubin 1.9 (H) 0.3 - 1.2 mg/dL   GFR calc non Af Amer 59 (L) >60 mL/min   GFR calc Af Amer >60 >60 mL/min    Comment: (NOTE) The eGFR has been calculated using the CKD EPI equation. This calculation has not been validated in all clinical situations. eGFR's persistently <60 mL/min signify possible Chronic Kidney Disease.    Anion gap 7 5 - 15  Rapid HIV screen (HIV 1/2 Ab+Ag)     Status: None   Collection Time: 06/24/16  3:12 AM  Result Value Ref Range   HIV-1 P24 Antigen - HIV24 NON REACTIVE NON REACTIVE   HIV 1/2 Antibodies NON REACTIVE NON REACTIVE   Interpretation (HIV Ag Ab)      A non reactive test result means that HIV 1 or HIV 2 antibodies and HIV 1 p24 antigen were not detected in the specimen.  Glucose, capillary     Status: Abnormal   Collection Time: 06/24/16  7:20 AM  Result Value Ref Range   Glucose-Capillary 108 (H) 65 - 99 mg/dL  Glucose, capillary     Status: Abnormal   Collection Time: 06/24/16 11:36 AM  Result Value Ref Range   Glucose-Capillary 122 (H) 65 - 99 mg/dL  Glucose, capillary     Status: Abnormal   Collection Time: 06/24/16  4:59 PM  Result Value Ref Range   Glucose-Capillary 113 (H) 65 - 99 mg/dL  Glucose, capillary     Status: Abnormal    Collection Time: 06/24/16  8:48 PM  Result Value Ref Range   Glucose-Capillary 136 (H) 65 - 99 mg/dL  CBC     Status: Abnormal   Collection Time: 06/25/16  3:05 AM  Result Value Ref Range   WBC 3.3 (L) 3.8 - 10.6 K/uL   RBC 3.23 (L) 4.40 - 5.90 MIL/uL   Hemoglobin 10.0 (L) 13.0 - 18.0 g/dL   HCT 28.3 (L) 40.0 - 52.0 %   MCV 87.6 80.0 - 100.0 fL   MCH 31.1 26.0 - 34.0 pg   MCHC 35.5 32.0 - 36.0 g/dL   RDW 16.5 (H) 11.5 - 14.5 %   Platelets 114 (L) 150 - 440 K/uL  Comprehensive metabolic panel     Status: Abnormal   Collection Time: 06/25/16  3:05 AM  Result Value Ref Range   Sodium 136 135 - 145 mmol/L   Potassium 4.2 3.5 - 5.1 mmol/L   Chloride 108 101 - 111 mmol/L   CO2 23 22 - 32 mmol/L   Glucose, Bld 140 (H) 65 - 99 mg/dL   BUN 22 (H) 6 - 20 mg/dL   Creatinine, Ser 1.02 0.61 - 1.24 mg/dL   Calcium 8.8 (L) 8.9 - 10.3 mg/dL   Total Protein 6.4 (L) 6.5 - 8.1 g/dL   Albumin 3.4 (L) 3.5 - 5.0 g/dL   AST 59 (H) 15 - 41 U/L   ALT 82 (H) 17 - 63 U/L   Alkaline Phosphatase 126 38 - 126 U/L   Total Bilirubin 1.3 (H) 0.3 - 1.2 mg/dL   GFR calc non Af Amer >60 >60 mL/min   GFR calc Af Amer >60 >60 mL/min    Comment: (NOTE) The eGFR has been calculated using the CKD EPI equation. This calculation has not been validated in all clinical situations. eGFR's persistently <60 mL/min signify possible Chronic Kidney Disease.    Anion gap 5 5 - 15  Glucose, capillary     Status: Abnormal   Collection Time: 06/25/16  7:20 AM  Result Value Ref Range   Glucose-Capillary 119 (H) 65 - 99 mg/dL   No components found for: ESR, C REACTIVE PROTEIN MICRO: Recent Results (from the past 720 hour(s))  Microscopic Examination     Status: Abnormal   Collection Time: 06/10/16 10:46 AM  Result Value Ref Range Status   WBC, UA >30 (A) 0 - 5 /hpf Final   RBC, UA >30 (A) 0 - 2 /hpf Final   Epithelial Cells (non renal) 0-10 0 - 10 /hpf Final   Bacteria, UA Moderate (A) None seen/Few Final  Veritor  Flu A/B Waived     Status: None   Collection Time: 06/21/16 10:21 AM  Result Value Ref Range Status   Influenza A Negative Negative Final   Influenza B Negative Negative Final    Comment: If the test is negative for the presence of influenza A or influenza B antigen, infection due to influenza cannot be ruled-out because the antigen present in the sample may be below the detection limit of the test. It is recommended that these results be confirmed by viral culture or an FDA-cleared influenza A and B molecular assay.   Urine culture     Status: None   Collection Time: 06/23/16 10:40 AM  Result Value Ref Range Status   Specimen Description URINE, CLEAN CATCH  Final   Special Requests NONE  Final   Culture NO GROWTH Performed at [facility]   Final   Report Status 06/24/2016 FINAL  Final  Blood culture (routine x 2)     Status: None (Preliminary result)   Collection Time: 06/23/16  2:18 PM  Result Value Ref Range Status   Specimen Description BLOOD LEFT ASSIST CONTROL  Final   Special Requests   Final    BOTTLES DRAWN AEROBIC AND ANAEROBIC [place]   Culture NO GROWTH 2 DAYS  Final   Report Status PENDING  Incomplete  Blood culture (routine x 2)     Status: None (Preliminary result)   Collection Time: 06/23/16  2:18 PM  Result Value Ref Range Status   Specimen Description BLOOD RIGHT ASSIST CONTROL  Final   Special Requests BOTTLES DRAWN AEROBIC AND ANAEROBIC [place]  Final   Culture NO GROWTH 2 DAYS  Final   Report Status PENDING  Incomplete    IMAGING: Dg Chest 1 View  Result Date: 06/23/2016 CLINICAL DATA:  Two days of unexplained fever, no current cardiopulmonary complaints; history of coronary artery disease and stent placement, diabetes, remote history of smoking. EXAM: CHEST 1 VIEW COMPARISON:  Report of a chest x-ray of April 24, 1998 FINDINGS: The lungs are adequately inflated and clear. The cardiac silhouette is mildly enlarged. The central  pulmonary vascularity is prominent without definite cephalization of the vascular pattern. There is no alveolar infiltrate or pleural effusion. The observed bony thorax is unremarkable. IMPRESSION: Mild cardiomegaly and central pulmonary vascular prominence. No pulmonary edema or pneumonia. Electronically Signed   By: Ericson Nafziger  [place] M.D.   On: 06/23/2016 14:47   US Abdomen Limited Ruq  Result Date: 06/04/2016 CLINICAL DATA:  Hepatitis C. EXAM: US ABDOMEN LIMITED - RIGHT UPPER QUADRANT COMPARISON:  09/19/2015. FINDINGS: Gallbladder: No gallstones noted. Gallbladder wall thickening to 5.1 mm noted. Although acalculous cholecystitis cannot be excluded, gallbladder thickening can be seen with hyperproteinemia or adjacent hepatitis. Common bile duct: Diameter: 5.0 mm Liver: Liver is echogenic with a slightly nodular contour consistent with cirrhosis. Portal vein is patent with normal directional flow P IMPRESSION: 1.  Gallbladder wall thickening to 5.1 mm. Although acalculous cholecystitis cannot be excluded, gallbladder wall thickening can be seen with hyperproteinemia or adjacent hepatitis-C. 2. Liver is echogenic with a slightly nodular contour consistent with cirrhosis. Electronically Signed   By: Marcello Moores  Register   On: 06/04/2016 09:44   Assessment:   Mason Wilson. is a 75 y.o. male with febrile illness for 2 weeks and + RMSF IGG at high titer (No IGM sent). He did have multiple recent tick bites. Had recent UTI and compelted 2 weeks bactrim.  I suspect with RMSF and just starting to respond to the doxy after 3-4 days or prostatitis  Recommendations Can Dc on doxy to complete a 10 -14 day course I can see in 3-4 days to fu. If sxs persist and wu neg would Pan CT scan   Thank you very much for allowing me to participate in the care of this patient. Please call with questions.   Cheral Marker. Ola Spurr, MD

## 2016-06-28 LAB — RMSF, IGG, IFA: RMSF, IGG, IFA: 1:256 {titer} — ABNORMAL HIGH

## 2016-06-28 LAB — CULTURE, BLOOD (ROUTINE X 2)
CULTURE: NO GROWTH
Culture: NO GROWTH

## 2016-06-28 LAB — ROCKY MTN SPOTTED FVR ABS PNL(IGG+IGM)
RMSF IGG: POSITIVE — AB
RMSF IgM: 0.36 index (ref 0.00–0.89)

## 2016-06-30 DIAGNOSIS — R509 Fever, unspecified: Secondary | ICD-10-CM | POA: Diagnosis not present

## 2016-06-30 DIAGNOSIS — A770 Spotted fever due to Rickettsia rickettsii: Secondary | ICD-10-CM | POA: Diagnosis not present

## 2016-06-30 DIAGNOSIS — N390 Urinary tract infection, site not specified: Secondary | ICD-10-CM | POA: Diagnosis not present

## 2016-06-30 NOTE — Telephone Encounter (Signed)
Left vm for pt to return my call.  

## 2016-06-30 NOTE — Telephone Encounter (Signed)
The ultrasound did not show any masses or cancers. The issue was that the gallbladder wall was slightly thickened that can be seen in liver disease but also can be seen in cholecystitis. Since he is not having any symptoms we will just recheck the ultrasound in 6 months.

## 2016-07-01 ENCOUNTER — Ambulatory Visit: Payer: Medicare Other | Admitting: Urology

## 2016-07-01 ENCOUNTER — Ambulatory Visit (INDEPENDENT_AMBULATORY_CARE_PROVIDER_SITE_OTHER): Payer: Medicare Other | Admitting: Urology

## 2016-07-01 ENCOUNTER — Encounter: Payer: Self-pay | Admitting: Urology

## 2016-07-01 VITALS — BP 114/63 | HR 69 | Ht 70.5 in | Wt 241.8 lb

## 2016-07-01 DIAGNOSIS — N390 Urinary tract infection, site not specified: Secondary | ICD-10-CM

## 2016-07-01 DIAGNOSIS — N401 Enlarged prostate with lower urinary tract symptoms: Secondary | ICD-10-CM | POA: Diagnosis not present

## 2016-07-01 DIAGNOSIS — I2510 Atherosclerotic heart disease of native coronary artery without angina pectoris: Secondary | ICD-10-CM | POA: Diagnosis not present

## 2016-07-01 DIAGNOSIS — N200 Calculus of kidney: Secondary | ICD-10-CM | POA: Diagnosis not present

## 2016-07-01 DIAGNOSIS — N138 Other obstructive and reflux uropathy: Secondary | ICD-10-CM

## 2016-07-01 LAB — BLADDER SCAN AMB NON-IMAGING: Scan Result: 0

## 2016-07-01 MED ORDER — TAMSULOSIN HCL 0.4 MG PO CAPS: 0.4000 mg | ORAL_CAPSULE | Freq: Every day | ORAL | 3 refills | Status: DC

## 2016-07-01 NOTE — Telephone Encounter (Signed)
Pt's wife notified. Pt is not experiencing any abdominal pain right now and is okay to just repeat US in 6 months.

## 2016-07-01 NOTE — Progress Notes (Signed)
07/01/2016 11:26 AM   Mason Wilson. [birth year] [id number]  Referring provider: [name], MD [address] [place], [place] [address]  Chief Complaint  Patient presents with  . New Patient (Initial Visit)    lower uti referred by dr. Jeananne Rama    HPI: Patient is 75 year old Caucasian male who is referred to [place] by, Volney American, PA-C, for recurrent UTI's  Patient presents today with his wife states that they have had 2 urinary tract infections over the last year. His urinary tract infection symptoms consist of dysuria, urgency and dribbling.  He is not having these symptoms at this time.    I have one documented UTI for E.Coli from 06/23/2016.    He has baseline urinary symptomatology of frequency, urgency, nocturia, postvoid dribbling, intermittency and weak urinary stream.  He denies gross hematuria, fevers, chills, nausea or vomiting at this time or with urinary tract infections.  He does have a history of nephrolithiasis.   A CT scan of the abdomen and pelvis with contrast noted tiny 1-2 mm renal calculi bilaterally with no evidence of hydronephrosis. And a mildly enlarged prostate.  He has not seen an urologist in the past.  He states he is drinking several bottles of water daily.    His I PSS score today is 16/3. His PVR 0 mL.      IPSS    Row Name 07/01/16 1100         International Prostate Symptom Score   How often have you had the sensation of not emptying your bladder? Less than 1 in 5     How often have you had to urinate less than every two hours? Less than half the time     How often have you found you stopped and started again several times when you urinated? Not at All     How often have you found it difficult to postpone urination? Almost always     How often have you had a weak urinary stream? More than half the time     How often have you had to strain to start urination? Less than 1 in 5 times     How many times did you typically  get up at night to urinate? 3 Times     Total IPSS Score 16       Quality of Life due to urinary symptoms   If you were to spend the rest of your life with your urinary condition just the way it is now how would you feel about that? Mixed        Score:  1-7 Mild 8-19 Moderate 20-35 Severe   PMH: Past Medical History:  Diagnosis Date  . Allergy   . Arthritis    "everywhere' - worse in back  . Body mass index 39.0-39.9, adult   . CAD (coronary artery disease)   . Chronic back pain   . Diabetes mellitus without complication ([place])   . Elevated liver enzymes   . GERD (gastroesophageal reflux disease)   . Heart murmur   . Hyperlipidemia   . Hypogonadism in male   . Melanoma ([place])    melanoma   . Melanoma in situ of right shoulder ([place])   . Renal stones   . Sleep apnea    CPAP  . Uses hearing aid    doesn't wear    Surgical History: Past Surgical History:  Procedure Laterality Date  . ANGIOPLASTY  1999  3 stents  . CARPAL TUNNEL RELEASE     x2  . CATARACT EXTRACTION, BILATERAL    . ESOPHAGOGASTRODUODENOSCOPY (EGD) WITH PROPOFOL N/A 09/08/2015   Procedure: ESOPHAGOGASTRODUODENOSCOPY (EGD) WITH PROPOFOL;  Surgeon: Lucilla Lame, MD;  Location: [facility];  Service: Endoscopy;  Laterality: N/A;  CPAP Diabetic - oral meds  . LIVER BIOPSY    . SKIN LESION EXCISION  1998  . TONSILLECTOMY     age 37    Home Medications:    Medication List       Accurate as of 07/01/16 11:26 AM. Always use your most recent med list.          aspirin EC 81 MG tablet Take 81 mg by mouth daily.   atenolol 100 MG tablet Commonly known as:  TENORMIN Take 1 tablet (100 mg total) by mouth daily.   clobetasol cream 0.05 % Commonly known as:  TEMOVATE   CRANBERRY EXTRACT PO Take by mouth.   doxycycline 100 MG tablet Commonly known as:  VIBRA-TABS Take 1 tablet (100 mg total) by mouth 2 (two) times daily.   glipiZIDE 5 MG tablet Commonly known as:  GLUCOTROL Take 1  tablet (5 mg total) by mouth daily before breakfast.   hydrochlorothiazide 25 MG tablet Commonly known as:  HYDRODIURIL Take 25 mg by mouth daily.   losartan 100 MG tablet Commonly known as:  COZAAR Take 1 tablet (100 mg total) by mouth daily.   metFORMIN 500 MG tablet Commonly known as:  GLUCOPHAGE Take 2 tablets (1,000 mg total) by mouth 2 (two) times daily with a meal.   omeprazole 20 MG capsule Commonly known as:  PRILOSEC Take 20 mg by mouth daily. AM   simvastatin 40 MG tablet Commonly known as:  ZOCOR Take 1 tablet (40 mg total) by mouth daily.   tamsulosin 0.4 MG Caps capsule Commonly known as:  FLOMAX Take 1 capsule (0.4 mg total) by mouth daily.   tiZANidine 4 MG tablet Commonly known as:  ZANAFLEX Reported on 01/13/2016   traMADol 50 MG tablet Commonly known as:  ULTRAM Reported on 02/09/2016       Allergies:  Allergies  Allergen Reactions  . Altace [Ramipril] Cough    Family History: Family History  Problem Relation Age of Onset  . Thyroid disease Mother   . Alzheimer's disease Mother   . Heart disease Mother   . Cancer Father 41    testicular  . Heart disease Father   . Hypertension Father   . Heart attack Father   . Kidney disease Neg Hx   . Prostate cancer Neg Hx     Social History:  reports that he quit smoking about 46 years ago. His smoking use included Cigarettes. His smokeless tobacco use includes Chew. He reports that he does not drink alcohol or use drugs.  ROS: UROLOGY Frequent Urination?: Yes Hard to postpone urination?: Yes Burning/pain with urination?: No Get up at night to urinate?: Yes Leakage of urine?: Yes Urine stream starts and stops?: Yes Trouble starting stream?: No Do you have to strain to urinate?: No Blood in urine?: No Urinary tract infection?: No Sexually transmitted disease?: No Injury to kidneys or bladder?: No Painful intercourse?: No Weak stream?: Yes Erection problems?: Yes Penile pain?:  No  Gastrointestinal Nausea?: No Vomiting?: No Indigestion/heartburn?: No Diarrhea?: No Constipation?: No  Constitutional Fever: Yes Night sweats?: Yes Weight loss?: Yes Fatigue?: Yes  Skin Skin rash/lesions?: Yes Itching?: No  Eyes Blurred vision?: No Double vision?:  No  Ears/Nose/Throat Sore throat?: No Sinus problems?: No  Hematologic/Lymphatic Swollen glands?: No Easy bruising?: Yes  Cardiovascular Leg swelling?: No Chest pain?: No  Respiratory Cough?: No Shortness of breath?: No  Endocrine Excessive thirst?: No  Musculoskeletal Back pain?: Yes Joint pain?: Yes  Neurological Headaches?: No Dizziness?: No  Psychologic Depression?: No Anxiety?: No  Physical Exam: BP 114/63   Pulse 69   Ht 5' 10.5" (1.791 m)   Wt 241 lb 12.8 oz (109.7 kg)   BMI 34.20 kg/m   Constitutional: Well nourished. Alert and oriented, No acute distress. HEENT: [place] AT, moist mucus membranes. Trachea midline, no masses. Cardiovascular: No clubbing, cyanosis, or edema. Respiratory: Normal respiratory effort, no increased work of breathing. GI: Abdomen is soft, non tender, non distended, no abdominal masses. Liver and spleen not palpable.  No hernias appreciated.  Stool sample for occult testing is not indicated.   GU: No CVA tenderness.  No bladder fullness or masses.  Patient with uncircumcised phallus.  Foreskin easily retracted Urethral meatus is patent.  No penile discharge. No penile lesions or rashes. Scrotum without lesions, cysts, rashes and/or edema.  Testicles are located scrotally bilaterally. No masses are appreciated in the testicles. Left and right epididymis are normal. Rectal: Patient with  normal sphincter tone. Anus and perineum without scarring or rashes. No rectal masses are appreciated. Prostate is approximately 45 grams, no nodules are appreciated. Seminal vesicles are normal. Skin: No rashes, bruises or suspicious lesions. Lymph: No cervical or inguinal  adenopathy. Neurologic: Grossly intact, no focal deficits, moving all 4 extremities. Psychiatric: Normal mood and affect.  Laboratory Data: Lab Results  Component Value Date   WBC 3.3 (L) 06/25/2016   HGB 10.0 (L) 06/25/2016   HCT 28.3 (L) 06/25/2016   MCV 87.6 06/25/2016   PLT 114 (L) 06/25/2016    Lab Results  Component Value Date   CREATININE 1.02 06/25/2016    Lab Results  Component Value Date   TSH 1.500 07/08/2015       Component Value Date/Time   CHOL 118 01/13/2016 0959   HDL 39 (L) 07/08/2015 1357   CHOLHDL 2.9 07/08/2015 1357   LDLCALC 54 07/08/2015 1357    Lab Results  Component Value Date   AST 59 (H) 06/25/2016   Lab Results  Component Value Date   ALT 82 (H) 06/25/2016     Pertinent Imaging: Results for LOAY, ILES (MRN [id number]) as of 07/01/2016 14:03  Ref. Range 07/01/2016 11:11  Scan Result Unknown 0    Assessment & Plan:    1. UTI (lower urinary tract infection)  - resolved at this time  - uncertain if patient is actually emptying his bladder all the time as his urinary symptoms are mostly obstructive  - patient to present to [place] with signs of infection  2. BPH with LUTS  - IPSS score is 16/3  - Continue conservative management, avoiding bladder irritants and timed voiding's  - Initiate alpha-blocker (tamsulosin 0.4 mg daily), discussed side effects  - RTC in one month for IPSS, PSA and exam   - BLADDER SCAN AMB NON-IMAGING  3. Bilateral nephrolithiasis  - small punctate stones seen on CT scan last year  - doubtful they are a nidus for infection   Return in about 1 month (around 08/01/2016) for IPSS and PVR.  These notes generated with voice recognition software. I apologize for typographical errors.  Zara Council, PA-C  [facility] [address], [place],  [address] [phone]  

## 2016-07-13 ENCOUNTER — Other Ambulatory Visit: Payer: Self-pay | Admitting: Family Medicine

## 2016-07-13 DIAGNOSIS — E119 Type 2 diabetes mellitus without complications: Secondary | ICD-10-CM

## 2016-07-19 ENCOUNTER — Encounter: Payer: Self-pay | Admitting: Family Medicine

## 2016-07-19 ENCOUNTER — Ambulatory Visit (INDEPENDENT_AMBULATORY_CARE_PROVIDER_SITE_OTHER): Payer: Medicare Other | Admitting: Family Medicine

## 2016-07-19 VITALS — BP 118/67 | HR 72 | Temp 98.0°F | Wt 244.0 lb

## 2016-07-19 DIAGNOSIS — A770 Spotted fever due to Rickettsia rickettsii: Secondary | ICD-10-CM

## 2016-07-19 DIAGNOSIS — E119 Type 2 diabetes mellitus without complications: Secondary | ICD-10-CM

## 2016-07-19 DIAGNOSIS — I2510 Atherosclerotic heart disease of native coronary artery without angina pectoris: Secondary | ICD-10-CM

## 2016-07-19 NOTE — Assessment & Plan Note (Signed)
Improving with weight loss.

## 2016-07-19 NOTE — Progress Notes (Signed)
   BP 118/67 (BP Location: Left Arm, Patient Position: Sitting, Cuff Size: Normal)   Pulse 72   Temp 98 F (36.7 C)   Wt 244 lb (110.7 kg) Comment: with shoes  SpO2 98%   BMI 34.52 kg/m    Subjective:    Patient ID: Mason Chute., male    DOB: 1941/04/20, 75 y.o.   MRN: [id number]  HPI: Mason Wilson is a 75 y.o. male  Chief Complaint  Patient presents with  . Hospitalization Follow-up  Patient doing much better feels back to normal urinary tract in [facility] spotted fevers all resolved. Patient not taking Invokanna because of concerns about amputation. Patient is lost 12 pounds and doing much better with diabetes control and good glucose readings.   Relevant past medical, surgical, family and social history reviewed and updated as indicated. Interim medical history since our last visit reviewed. Allergies and medications reviewed and updated.  Review of Systems  Constitutional: Negative.   Respiratory: Negative.   Cardiovascular: Negative.     Per HPI unless specifically indicated above     Objective:    BP 118/67 (BP Location: Left Arm, Patient Position: Sitting, Cuff Size: Normal)   Pulse 72   Temp 98 F (36.7 C)   Wt 244 lb (110.7 kg) Comment: with shoes  SpO2 98%   BMI 34.52 kg/m   Wt Readings from Last 3 Encounters:  07/19/16 244 lb (110.7 kg)  07/01/16 241 lb 12.8 oz (109.7 kg)  06/24/16 266 lb (120.7 kg)    Physical Exam  Constitutional: He is oriented to person, place, and time. He appears well-developed and well-nourished. No distress.  HENT:  Head: Normocephalic and atraumatic.  Right Ear: Hearing normal.  Left Ear: Hearing normal.  Nose: Nose normal.  Eyes: Conjunctivae and lids are normal. Right eye exhibits no discharge. Left eye exhibits no discharge. No scleral icterus.  Cardiovascular: Normal rate, regular rhythm and normal heart sounds.   Pulmonary/Chest: Effort normal and breath sounds normal. No respiratory distress.    Musculoskeletal: Normal range of motion.  Neurological: He is alert and oriented to person, place, and time.  Skin: Skin is intact. No rash noted.  Psychiatric: He has a normal mood and affect. His speech is normal and behavior is normal. Judgment and thought content normal. Cognition and memory are normal.    Results for orders placed or performed in visit on 07/01/16  BLADDER SCAN AMB NON-IMAGING  Result Value Ref Range   Scan Result 0       Assessment & Plan:   Problem List Items Addressed This Visit      Endocrine   Diabetes mellitus without complication ([place])    Improving with weight loss       Other Visit Diagnoses    [facility] spotted fever    -  Primary   Resolved patient back to normal feeling well       Follow up plan: Return in about 4 weeks (around 08/16/2016) for Hemoglobin A1c, Physical Exam.

## 2016-07-21 ENCOUNTER — Other Ambulatory Visit: Payer: Self-pay | Admitting: Family Medicine

## 2016-07-21 DIAGNOSIS — E119 Type 2 diabetes mellitus without complications: Secondary | ICD-10-CM

## 2016-07-21 DIAGNOSIS — E785 Hyperlipidemia, unspecified: Secondary | ICD-10-CM

## 2016-07-21 DIAGNOSIS — I2583 Coronary atherosclerosis due to lipid rich plaque: Secondary | ICD-10-CM

## 2016-07-21 DIAGNOSIS — I2510 Atherosclerotic heart disease of native coronary artery without angina pectoris: Secondary | ICD-10-CM

## 2016-08-02 ENCOUNTER — Ambulatory Visit (INDEPENDENT_AMBULATORY_CARE_PROVIDER_SITE_OTHER): Payer: Medicare Other | Admitting: Urology

## 2016-08-02 VITALS — BP 113/58 | HR 71 | Ht 70.0 in | Wt 248.0 lb

## 2016-08-02 DIAGNOSIS — N138 Other obstructive and reflux uropathy: Secondary | ICD-10-CM

## 2016-08-02 DIAGNOSIS — I2510 Atherosclerotic heart disease of native coronary artery without angina pectoris: Secondary | ICD-10-CM

## 2016-08-02 DIAGNOSIS — N400 Enlarged prostate without lower urinary tract symptoms: Secondary | ICD-10-CM | POA: Diagnosis not present

## 2016-08-02 DIAGNOSIS — N528 Other male erectile dysfunction: Secondary | ICD-10-CM

## 2016-08-02 DIAGNOSIS — N529 Male erectile dysfunction, unspecified: Secondary | ICD-10-CM

## 2016-08-02 DIAGNOSIS — N390 Urinary tract infection, site not specified: Secondary | ICD-10-CM

## 2016-08-02 DIAGNOSIS — N200 Calculus of kidney: Secondary | ICD-10-CM

## 2016-08-02 DIAGNOSIS — N401 Enlarged prostate with lower urinary tract symptoms: Secondary | ICD-10-CM

## 2016-08-02 LAB — BLADDER SCAN AMB NON-IMAGING

## 2016-08-02 MED ORDER — TAMSULOSIN HCL 0.4 MG PO CAPS: 0.4000 mg | ORAL_CAPSULE | Freq: Every day | ORAL | 4 refills | Status: DC

## 2016-08-02 NOTE — Patient Instructions (Addendum)
Alprostadil intracavernosal injection What is this medicine? ALPROSTADIL (al PROS ta dil) is used to treat erectile dysfunction (ED). This medicine helps to create and maintain an erection. This medicine may be used for other purposes; ask your health care provider or pharmacist if you have questions. What should I tell my health care provider before I take this medicine? They need to know if you have any of these conditions: -an abnormally formed penis -have been advised not to engage in sexual activity -have ever had a painful erection that lasted more than 4 hours -heart problems -leukemia -low blood pressure -penile implant -sickle cell disease or trait -tumor of the bone marrow (multiple myeloma) -an unusual or allergic reaction to alprostadil or other medicines, foods, dyes, or preservatives How should I use this medicine? This medicine is for injection into the penis. You will be taught how to use this medicine. Use exactly as directed. Do not take your medicine more often than directed. It is important that you put your used needles and syringes in a special sharps container. Do not put them in a trash can. If you do not have a sharps container, call your pharmacist or healthcare provider to get one. This medicine is for use in men only and is not for use in children. Overdosage: If you think you have taken too much of this medicine contact a poison control center or emergency room at once. NOTE: This medicine is only for you. Do not share this medicine with others. What if I miss a dose? You should not use this medicine more than 3 times a week. Each dose should be given at least 24 hours apart. What may interact with this medicine? -medicines for blood pressure This list may not describe all possible interactions. Give your health care provider a list of all the medicines, herbs, non-prescription drugs, or dietary supplements you use. Also tell them if you smoke, drink alcohol, or  use illegal drugs. Some items may interact with your medicine. What should I watch for while using this medicine? Contact your doctor or health care professional right away if you have an erection that lasts longer than 4 hours or if it becomes painful. This may be a sign of a serious problem and must be treated right away to prevent permanent damage. Do not change the dose of your medication. Call your doctor or health care professional to determine if your dose needs to be changed. This medicine does not protect you or your partner against HIV infection (the virus that causes AIDS) or other sexually transmitted diseases. What side effects may I notice from receiving this medicine? Side effects that you should report to your doctor or health care professional as soon as possible: -allergic reactions like skin rash, itching or hives, swelling of the face, lips, or tongue -prolonged or painful erection Side effects that usually do not require medical attention (report to your doctor or health care professional if they continue or are bothersome): -bleeding, bruising, or pain at site of injection -change in blood pressure This list may not describe all possible side effects. Call your doctor for medical advice about side effects. You may report side effects to FDA at 1-800-FDA-1088. Where should I keep my medicine? Keep out of reach of children. Caverject: Store unopened product at or below 25 degrees C (77 degrees F). Do not freeze. See product instructions for storage when product is in use. Different products may have different instructions for storage. Throw away any unused  medicine after the expiration date. Edex: Store unopened product at room temperature between 15 and 30 degrees C (59 and 86 degrees F). Do not freeze. See product instructions for storage when product is in use. Throw away any unused medicine after the expiration date. When traveling, do not store this medicine in checked  luggage during air travel or leave in a closed automobile. NOTE: This sheet is a summary. It may not cover all possible information. If you have questions about this medicine, talk to your doctor, pharmacist, or health care provider.    2016, Elsevier/Gold Standard. (2015-03-26 13:19:52)

## 2016-08-02 NOTE — Progress Notes (Signed)
08/02/2016 8:51 AM   Mason Wilson. 05/09/1941 [id number]  Referring provider: [name], MD [address] [place], [place] [address]  Chief Complaint  Patient presents with  . Benign Prostatic Hypertrophy    1 month    HPI: Patient is a 75 year old Caucasian male who presents today for a month follow up after starting tamsulosin for BPH with LUTS.  Background history Patient was referred to [place] by, Volney American, PA-C, for recurrent UTI's.  Patient presents today with his wife states that they have had 2 urinary tract infections over the last year. His urinary tract infection symptoms consist of dysuria, urgency and dribbling.  He is not having these symptoms at this time.  I have one documented UTI for E.Coli from 06/23/2016.  He has baseline urinary symptomatology of frequency, urgency, nocturia, postvoid dribbling, intermittency and weak urinary stream.  He denies gross hematuria, fevers, chills, nausea or vomiting at this time or with urinary tract infections.  He does have a history of nephrolithiasis.   A CT scan of the abdomen and pelvis with contrast noted tiny 1-2 mm renal calculi bilaterally with no evidence of hydronephrosis. And a mildly enlarged prostate.  He has not seen an urologist in the past.  He states he is drinking several bottles of water daily.    BPH WITH LUTS His IPSS score today is 4, which is mild lower urinary tract symptomatology. He is mostly satisfied with his quality life due to his urinary symptoms. His PVR is 74 mL.  His previous I PSS score was 16/3. His previous PVR was  0 mL.  His major complaint today a weak urinary stream.  He has had these symptoms for a few years.  He denies any dysuria, hematuria or suprapubic pain.  He currently taking tamsulosin 0.4 mg daily.  He has been noticing low BP since he has been on the tamsulosin.  He also denies any recent fevers, chills, nausea or vomiting.  He does not have a family history of PCa.     IPSS    Row Name 07/01/16 1100 08/02/16 1000       International Prostate Symptom Score   How often have you had the sensation of not emptying your bladder? Less than 1 in 5 Not at All    How often have you had to urinate less than every two hours? Less than half the time Not at All    How often have you found you stopped and started again several times when you urinated? Not at All Not at All    How often have you found it difficult to postpone urination? Almost always Less than 1 in 5 times    How often have you had a weak urinary stream? More than half the time Less than half the time    How often have you had to strain to start urination? Less than 1 in 5 times Not at All    How many times did you typically get up at night to urinate? 3 Times 1 Time    Total IPSS Score 16 4      Quality of Life due to urinary symptoms   If you were to spend the rest of your life with your urinary condition just the way it is now how would you feel about that? Mixed Mostly Satisfied       Score:  1-7 Mild 8-19 Moderate 20-35 Severe  UTI No recent  symptoms of an UTI.  Bilateral nephrolithiasis No recent symptoms of gross hematuria or flank pain.    Erectile dysfunction Patient has not had good effect with PDE-5 inhibitors.  He is wanting to know his other options.    PMH: Past Medical History:  Diagnosis Date  . Allergy   . Arthritis    "everywhere' - worse in back  . Body mass index 39.0-39.9, adult   . CAD (coronary artery disease)   . Chronic back pain   . Diabetes mellitus without complication ([place])   . Elevated liver enzymes   . GERD (gastroesophageal reflux disease)   . Heart murmur   . Hyperlipidemia   . Hypogonadism in male   . Melanoma ([place])    melanoma   . Melanoma in situ of right shoulder ([place])   . Renal stones   . Sleep apnea    CPAP  . Uses hearing aid    doesn't wear    Surgical History: Past Surgical History:  Procedure Laterality Date  . ANGIOPLASTY   1999   3 stents  . CARPAL TUNNEL RELEASE     x2  . CATARACT EXTRACTION, BILATERAL    . ESOPHAGOGASTRODUODENOSCOPY (EGD) WITH PROPOFOL N/A 09/08/2015   Procedure: ESOPHAGOGASTRODUODENOSCOPY (EGD) WITH PROPOFOL;  Surgeon: Lucilla Lame, MD;  Location: [facility];  Service: Endoscopy;  Laterality: N/A;  CPAP Diabetic - oral meds  . LIVER BIOPSY    . SKIN LESION EXCISION  1998  . TONSILLECTOMY     age 32    Home Medications:    Medication List       Accurate as of 08/02/16 11:59 PM. Always use your most recent med list.          aspirin EC 81 MG tablet Take 81 mg by mouth daily.   atenolol 100 MG tablet Commonly known as:  TENORMIN Take 1 tablet (100 mg total) by mouth daily.   clobetasol cream 0.05 % Commonly known as:  TEMOVATE   glipiZIDE 5 MG tablet Commonly known as:  GLUCOTROL TAKE 1 TABLET(5 MG) BY MOUTH DAILY BEFORE BREAKFAST   hydrochlorothiazide 25 MG tablet Commonly known as:  HYDRODIURIL Take 25 mg by mouth daily.   losartan 100 MG tablet Commonly known as:  COZAAR Take 1 tablet (100 mg total) by mouth daily.   metFORMIN 500 MG tablet Commonly known as:  GLUCOPHAGE TAKE 2 TABLETS(1000 MG) BY MOUTH TWICE DAILY WITH A MEAL   omeprazole 20 MG capsule Commonly known as:  PRILOSEC Take 20 mg by mouth daily. AM   simvastatin 40 MG tablet Commonly known as:  ZOCOR TAKE 1 TABLET(40 MG) BY MOUTH DAILY   tamsulosin 0.4 MG Caps capsule Commonly known as:  FLOMAX Take 1 capsule (0.4 mg total) by mouth daily.   tiZANidine 4 MG tablet Commonly known as:  ZANAFLEX Reported on 01/13/2016   traMADol 50 MG tablet Commonly known as:  ULTRAM Reported on 02/09/2016       Allergies:  Allergies  Allergen Reactions  . Altace [Ramipril] Cough    Family History: Family History  Problem Relation Age of Onset  . Thyroid disease Mother   . Alzheimer's disease Mother   . Heart disease Mother   . Cancer Father 39    testicular  . Heart disease Father    . Hypertension Father   . Heart attack Father   . Kidney disease Neg Hx   . Prostate cancer Neg Hx     Social History:  reports that he quit smoking about 46 years ago. His smoking use included Cigarettes. His smokeless tobacco use includes Chew. He reports that he does not drink alcohol or use drugs.  ROS: UROLOGY Frequent Urination?: No Hard to postpone urination?: No Burning/pain with urination?: No Get up at night to urinate?: No Leakage of urine?: No Urine stream starts and stops?: No Trouble starting stream?: No Do you have to strain to urinate?: No Blood in urine?: No Urinary tract infection?: No Sexually transmitted disease?: No Injury to kidneys or bladder?: No Painful intercourse?: No Weak stream?: Yes Erection problems?: Yes Penile pain?: No  Gastrointestinal Nausea?: No Vomiting?: No Indigestion/heartburn?: No Diarrhea?: No Constipation?: No  Constitutional Fever: No Night sweats?: No Weight loss?: No Fatigue?: No  Skin Skin rash/lesions?: No Itching?: No  Eyes Blurred vision?: No Double vision?: No  Ears/Nose/Throat Sore throat?: No Sinus problems?: No  Hematologic/Lymphatic Swollen glands?: No Easy bruising?: Yes  Cardiovascular Leg swelling?: No Chest pain?: No  Respiratory Cough?: No Shortness of breath?: No  Endocrine Excessive thirst?: No  Musculoskeletal Back pain?: Yes Joint pain?: Yes  Neurological Headaches?: No Dizziness?: Yes  Psychologic Depression?: No Anxiety?: No  Physical Exam: BP (!) 113/58   Pulse 71   Ht 5\' 10"  (1.778 m)   Wt 248 lb (112.5 kg)   BMI 35.58 kg/m   Constitutional: Well nourished. Alert and oriented, No acute distress. HEENT: [place] AT, moist mucus membranes. Trachea midline, no masses. Cardiovascular: No clubbing, cyanosis, or edema. Respiratory: Normal respiratory effort, no increased work of breathing. GI: Abdomen is soft, non tender, non distended, no abdominal masses. Liver and  spleen not palpable.  No hernias appreciated.  Stool sample for occult testing is not indicated.   GU: No CVA tenderness.  No bladder fullness or masses.  Patient with uncircumcised phallus.  Foreskin easily retracted Urethral meatus is patent.  No penile discharge. No penile lesions or rashes. Scrotum without lesions, cysts, rashes and/or edema.  Testicles are located scrotally bilaterally. No masses are appreciated in the testicles. Left and right epididymis are normal. Rectal: Patient with  normal sphincter tone. Anus and perineum without scarring or rashes. No rectal masses are appreciated. Prostate is approximately 45 grams, no nodules are appreciated. Seminal vesicles are normal. Skin: No rashes, bruises or suspicious lesions. Lymph: No cervical or inguinal adenopathy. Neurologic: Grossly intact, no focal deficits, moving all 4 extremities. Psychiatric: Normal mood and affect.  Laboratory Data: Lab Results  Component Value Date   WBC 3.3 (L) 06/25/2016   HGB 10.0 (L) 06/25/2016   HCT 28.3 (L) 06/25/2016   MCV 87.6 06/25/2016   PLT 114 (L) 06/25/2016    Lab Results  Component Value Date   CREATININE 1.02 06/25/2016    Lab Results  Component Value Date   TSH 1.500 07/08/2015       Component Value Date/Time   CHOL 118 01/13/2016 0959   HDL 39 (L) 07/08/2015 1357   CHOLHDL 2.9 07/08/2015 1357   VLDL 23 01/13/2016 0959   LDLCALC 54 07/08/2015 1357    Lab Results  Component Value Date   AST 59 (H) 06/25/2016   Lab Results  Component Value Date   ALT 82 (H) 06/25/2016     Pertinent Imaging: Results for TRAVANTE, KNEE (MRN [id number]) as of 08/06/2016 08:32  Ref. Range 08/02/2016 10:09  Scan Result Unknown 49ml     Assessment & Plan:    1. UTI (lower urinary tract infection)  - resolved at this  time  - uncertain if patient is actually emptying his bladder all the time as his urinary symptoms are mostly obstructive- continue alpha-blocker  - patient to  present to [place] with signs of infection  2. BPH with LUTS  - IPSS score is 4/2, it is improving  - Continue conservative management, avoiding bladder irritants and timed voiding's  - Change tamsulosin 0.4 mg daily to Rapaflo 8 mg daily to see if low BP improves, if not may consider Cialis 5 mg daily (unsure if this will be cost prohibitive)  - patient will call if no improvement with Rapaflo or if he needs a prescription for Rapaflo  - RTC in one year for IPSS, PSA and exam   - BLADDER SCAN AMB NON-IMAGING  3. Bilateral nephrolithiasis  - small punctate stones seen on CT scan last year  - doubtful they are a nidus for infection  - KUB in one year  4. Erectile dysfunction  - discussed intracavernousal injections- patient would like to think about this options  - information given  - he will call if he would like to pursue this therapy   Return in about 1 year (around 08/02/2017) for IPSS, PSA, KUB and exam.  These notes generated with voice recognition software. I apologize for typographical errors.  Zara Council, [facility] [address], [place], [place] [address] [phone]

## 2016-08-03 ENCOUNTER — Telehealth: Payer: Self-pay

## 2016-08-03 LAB — PSA: Prostate Specific Ag, Serum: 1.8 ng/mL (ref 0.0–4.0)

## 2016-08-03 NOTE — Telephone Encounter (Signed)
-----   Message from Nori Riis, PA-C sent at 08/03/2016 12:52 PM EDT ----- PSA is stable.  We will see him in one year.

## 2016-08-03 NOTE — Telephone Encounter (Signed)
LMOM- most recent labs are stable. Will see next year.

## 2016-08-06 ENCOUNTER — Encounter: Payer: Self-pay | Admitting: Urology

## 2016-08-09 ENCOUNTER — Ambulatory Visit (INDEPENDENT_AMBULATORY_CARE_PROVIDER_SITE_OTHER): Payer: Medicare Other | Admitting: Family Medicine

## 2016-08-09 ENCOUNTER — Encounter: Payer: Self-pay | Admitting: Family Medicine

## 2016-08-09 VITALS — BP 113/64 | HR 60 | Temp 98.3°F | Wt 243.0 lb

## 2016-08-09 DIAGNOSIS — Z23 Encounter for immunization: Secondary | ICD-10-CM | POA: Diagnosis not present

## 2016-08-09 DIAGNOSIS — E7800 Pure hypercholesterolemia, unspecified: Secondary | ICD-10-CM

## 2016-08-09 DIAGNOSIS — G4730 Sleep apnea, unspecified: Secondary | ICD-10-CM | POA: Diagnosis not present

## 2016-08-09 DIAGNOSIS — I2510 Atherosclerotic heart disease of native coronary artery without angina pectoris: Secondary | ICD-10-CM

## 2016-08-09 DIAGNOSIS — Z0000 Encounter for general adult medical examination without abnormal findings: Secondary | ICD-10-CM

## 2016-08-09 DIAGNOSIS — I2583 Coronary atherosclerosis due to lipid rich plaque: Secondary | ICD-10-CM

## 2016-08-09 DIAGNOSIS — E119 Type 2 diabetes mellitus without complications: Secondary | ICD-10-CM

## 2016-08-09 DIAGNOSIS — I10 Essential (primary) hypertension: Secondary | ICD-10-CM | POA: Diagnosis not present

## 2016-08-09 LAB — MICROALBUMIN, URINE WAIVED
CREATININE, URINE WAIVED: 200 mg/dL (ref 10–300)
MICROALB, UR WAIVED: 30 mg/L — AB (ref 0–19)
Microalb/Creat Ratio: <30 mg/g (ref <30)

## 2016-08-09 LAB — BAYER DCA HB A1C WAIVED: HB A1C: 6 % (ref <7.0)

## 2016-08-09 MED ORDER — SIMVASTATIN 40 MG PO TABS: 40.0000 mg | ORAL_TABLET | Freq: Every day | ORAL | 4 refills | Status: DC

## 2016-08-09 MED ORDER — GLIPIZIDE 5 MG PO TABS: 5.0000 mg | ORAL_TABLET | Freq: Every day | ORAL | 4 refills | Status: DC

## 2016-08-09 MED ORDER — LOSARTAN POTASSIUM 100 MG PO TABS: 100.0000 mg | ORAL_TABLET | Freq: Every day | ORAL | 4 refills | Status: DC

## 2016-08-09 MED ORDER — ATENOLOL 50 MG PO TABS: 50.0000 mg | ORAL_TABLET | Freq: Every day | ORAL | 4 refills | Status: DC

## 2016-08-09 MED ORDER — HYDROCHLOROTHIAZIDE 25 MG PO TABS: 25.0000 mg | ORAL_TABLET | Freq: Every day | ORAL | 4 refills | Status: DC

## 2016-08-09 MED ORDER — METFORMIN HCL 500 MG PO TABS: 1000.0000 mg | ORAL_TABLET | Freq: Two times a day (BID) | ORAL | 4 refills | Status: DC

## 2016-08-09 NOTE — Assessment & Plan Note (Signed)
Continues to use CPAP. °

## 2016-08-09 NOTE — Progress Notes (Signed)
BP 113/64 (BP Location: Left Arm, Patient Position: Sitting, Cuff Size: Normal)   Pulse 60   Temp 98.3 F (36.8 C)   Wt 243 lb (110.2 kg) Comment: with shoes  SpO2 98%   BMI 34.87 kg/m    Subjective:    Patient ID: Mason Chute., male    DOB: 21-Mar-1941, 75 y.o.   MRN: [id number]  HPI: Mason Wilson is a 75 y.o. male  Chief Complaint  Patient presents with  . Diabetes  AWV  Metrics met  follow-up diabetes doing well no complaints taking medications noted low blood sugar spells. Was started on tamsulosin by urology for BPH with lower urinary tract symptoms and urinary tract infection. Patient developed a low blood pressure spells very weak and lightheaded dizzy Tamsulosin was changed to namebrand Blood pressure is still low on review will decrease atenolol from 100 mg to 50 mg Relevant past medical, surgical, family and social history reviewed and updated as indicated. Interim medical history since our last visit reviewed. Allergies and medications reviewed and updated.  Review of Systems  Constitutional: Negative.   HENT: Negative.   Eyes: Negative.   Respiratory: Negative.   Cardiovascular: Negative.   Gastrointestinal: Negative.   Endocrine: Negative.   Musculoskeletal: Negative.   Skin: Negative.   Allergic/Immunologic: Negative.   Neurological: Negative.   Hematological: Negative.   Psychiatric/Behavioral: Negative.     Per HPI unless specifically indicated above     Objective:    BP 113/64 (BP Location: Left Arm, Patient Position: Sitting, Cuff Size: Normal)   Pulse 60   Temp 98.3 F (36.8 C)   Wt 243 lb (110.2 kg) Comment: with shoes  SpO2 98%   BMI 34.87 kg/m   Wt Readings from Last 3 Encounters:  08/09/16 243 lb (110.2 kg)  08/02/16 248 lb (112.5 kg)  07/19/16 244 lb (110.7 kg)    Physical Exam  Constitutional: He is oriented to person, place, and time. He appears well-developed and well-nourished.  HENT:  Head: Normocephalic.    Right Ear: External ear normal.  Left Ear: External ear normal.  Nose: Nose normal.  Eyes: Conjunctivae and EOM are normal. Pupils are equal, round, and reactive to light.  Neck: Normal range of motion. Neck supple. No thyromegaly present.  Cardiovascular: Normal rate, regular rhythm, normal heart sounds and intact distal pulses.   Pulmonary/Chest: Effort normal and breath sounds normal.  Abdominal: Soft. Bowel sounds are normal. There is no splenomegaly or hepatomegaly.  Genitourinary: Penis normal.  Genitourinary Comments: Done in urology  Musculoskeletal: Normal range of motion.  Lymphadenopathy:    He has no cervical adenopathy.  Neurological: He is alert and oriented to person, place, and time. He has normal reflexes.  Skin: Skin is warm and dry.  Psychiatric: He has a normal mood and affect. His behavior is normal. Judgment and thought content normal.    Results for orders placed or performed in visit on 08/02/16  PSA  Result Value Ref Range   Prostate Specific Ag, Serum 1.8 0.0 - 4.0 ng/mL  BLADDER SCAN AMB NON-IMAGING  Result Value Ref Range   Scan Result 35m       Assessment & Plan:   Problem List Items Addressed This Visit      Cardiovascular and Mediastinum   CAD (coronary artery disease)    The current medical regimen is effective;  continue present plan and medications.       Relevant Medications   simvastatin (ZOCOR) 40  MG tablet   losartan (COZAAR) 100 MG tablet   hydrochlorothiazide (HYDRODIURIL) 25 MG tablet   atenolol (TENORMIN) 50 MG tablet   Other Relevant Orders   Comprehensive metabolic panel   CBC with Differential/Platelet   TSH   Essential hypertension    Discussed blood pressure possibly being too low reducing atenolol from 100 mg to 50 mg follow blood pressure carefully      Relevant Medications   simvastatin (ZOCOR) 40 MG tablet   losartan (COZAAR) 100 MG tablet   hydrochlorothiazide (HYDRODIURIL) 25 MG tablet   atenolol (TENORMIN)  50 MG tablet   Other Relevant Orders   CBC with Differential/Platelet   TSH     Respiratory   Sleep apnea    Continues to use CPAP      Relevant Orders   TSH     Endocrine   Diabetes mellitus without complication ([place]) - Primary    The current medical regimen is effective;  continue present plan and medications.       Relevant Medications   simvastatin (ZOCOR) 40 MG tablet   metFORMIN (GLUCOPHAGE) 500 MG tablet   losartan (COZAAR) 100 MG tablet   glipiZIDE (GLUCOTROL) 5 MG tablet   Other Relevant Orders   Bayer DCA Hb A1c Waived   Microalbumin, Urine Waived   Comprehensive metabolic panel   CBC with Differential/Platelet   TSH     Other   Hyperlipidemia   Relevant Medications   simvastatin (ZOCOR) 40 MG tablet   losartan (COZAAR) 100 MG tablet   hydrochlorothiazide (HYDRODIURIL) 25 MG tablet   atenolol (TENORMIN) 50 MG tablet   Other Relevant Orders   Lipid panel   TSH    Other Visit Diagnoses    Immunization due       Relevant Orders   Flu vaccine HIGH DOSE PF (Fluzone High dose) (Completed)   PE (physical exam), annual       Relevant Orders   Comprehensive metabolic panel   TSH       Follow up plan: Return in about 3 months (around 11/09/2016) for Hemoglobin A1c.

## 2016-08-09 NOTE — Assessment & Plan Note (Signed)
The current medical regimen is effective;  continue present plan and medications.  

## 2016-08-09 NOTE — Assessment & Plan Note (Signed)
Discussed blood pressure possibly being too low reducing atenolol from 100 mg to 50 mg follow blood pressure carefully

## 2016-08-09 NOTE — Patient Instructions (Addendum)

## 2016-08-10 ENCOUNTER — Encounter: Payer: Self-pay | Admitting: Family Medicine

## 2016-08-10 LAB — COMPREHENSIVE METABOLIC PANEL
ALT: 45 IU/L — ABNORMAL HIGH (ref 0–44)
AST: 37 IU/L (ref 0–40)
Albumin/Globulin Ratio: 1.7 (ref 1.2–2.2)
Albumin: 4.3 g/dL (ref 3.5–4.8)
Alkaline Phosphatase: 79 IU/L (ref 39–117)
BUN/Creatinine Ratio: 21 (ref 10–24)
BUN: 23 mg/dL (ref 8–27)
Bilirubin Total: 0.7 mg/dL (ref 0.0–1.2)
CO2: 22 mmol/L (ref 18–29)
Calcium: 9.4 mg/dL (ref 8.6–10.2)
Chloride: 104 mmol/L (ref 96–106)
Creatinine, Ser: 1.09 mg/dL (ref 0.76–1.27)
GFR calc Af Amer: 77 mL/min/{1.73_m2} (ref >59)
GFR calc non Af Amer: 67 mL/min/{1.73_m2} (ref >59)
Globulin, Total: 2.6 g/dL (ref 1.5–4.5)
Glucose: 83 mg/dL (ref 65–99)
Potassium: 4.2 mmol/L (ref 3.5–5.2)
Sodium: 141 mmol/L (ref 134–144)
Total Protein: 6.9 g/dL (ref 6.0–8.5)

## 2016-08-10 LAB — LIPID PANEL
CHOL/HDL RATIO: 3 ratio (ref 0.0–5.0)
Cholesterol, Total: 120 mg/dL (ref 100–199)
HDL: 40 mg/dL (ref >39)
LDL Calculated: 61 mg/dL (ref 0–99)
TRIGLYCERIDES: 95 mg/dL (ref 0–149)
VLDL Cholesterol Cal: 19 mg/dL (ref 5–40)

## 2016-08-10 LAB — CBC WITH DIFFERENTIAL/PLATELET
BASOS ABS: 0 10*3/uL (ref 0.0–0.2)
Basos: 0 %
EOS (ABSOLUTE): 0.2 10*3/uL (ref 0.0–0.4)
Eos: 6 %
Hematocrit: 33 % — ABNORMAL LOW (ref 37.5–51.0)
Hemoglobin: 11.3 g/dL — ABNORMAL LOW (ref 12.6–17.7)
IMMATURE GRANS (ABS): 0 10*3/uL (ref 0.0–0.1)
IMMATURE GRANULOCYTES: 0 %
LYMPHS: 37 %
Lymphocytes Absolute: 1 10*3/uL (ref 0.7–3.1)
MCH: 29.7 pg (ref 26.6–33.0)
MCHC: 34.2 g/dL (ref 31.5–35.7)
MCV: 87 fL (ref 79–97)
MONOCYTES: 12 %
Monocytes Absolute: 0.3 10*3/uL (ref 0.1–0.9)
NEUTROS ABS: 1.3 10*3/uL — AB (ref 1.4–7.0)
NEUTROS PCT: 45 %
PLATELETS: 91 10*3/uL — AB (ref 150–379)
RBC: 3.8 x10E6/uL — AB (ref 4.14–5.80)
RDW: 13.4 % (ref 12.3–15.4)
WBC: 2.8 10*3/uL — ABNORMAL LOW (ref 3.4–10.8)

## 2016-08-10 LAB — TSH: TSH: 2.1 u[IU]/mL (ref 0.450–4.500)

## 2016-08-23 ENCOUNTER — Other Ambulatory Visit: Payer: Self-pay | Admitting: Family Medicine

## 2016-08-23 ENCOUNTER — Telehealth: Payer: Self-pay | Admitting: Family Medicine

## 2016-08-23 MED ORDER — METOPROLOL SUCCINATE ER 50 MG PO TB24: 50.0000 mg | ORAL_TABLET | Freq: Every day | ORAL | 3 refills | Status: DC

## 2016-08-23 NOTE — Telephone Encounter (Signed)
Routing to provider  

## 2016-08-23 NOTE — Telephone Encounter (Signed)
Call pt change called in brother medicine same dose

## 2016-09-22 ENCOUNTER — Inpatient Hospital Stay: Payer: Medicare Other | Attending: Internal Medicine | Admitting: Internal Medicine

## 2016-09-22 ENCOUNTER — Inpatient Hospital Stay: Payer: Medicare Other

## 2016-09-22 VITALS — BP 111/69 | HR 95 | Temp 98.1°F | Resp 18 | Wt 245.8 lb

## 2016-09-22 DIAGNOSIS — K766 Portal hypertension: Secondary | ICD-10-CM | POA: Diagnosis not present

## 2016-09-22 DIAGNOSIS — K7469 Other cirrhosis of liver: Secondary | ICD-10-CM

## 2016-09-22 DIAGNOSIS — K7460 Unspecified cirrhosis of liver: Secondary | ICD-10-CM

## 2016-09-22 DIAGNOSIS — Z79899 Other long term (current) drug therapy: Secondary | ICD-10-CM | POA: Diagnosis not present

## 2016-09-22 DIAGNOSIS — R161 Splenomegaly, not elsewhere classified: Secondary | ICD-10-CM

## 2016-09-22 DIAGNOSIS — D61818 Other pancytopenia: Secondary | ICD-10-CM | POA: Diagnosis not present

## 2016-09-22 DIAGNOSIS — Z7982 Long term (current) use of aspirin: Secondary | ICD-10-CM | POA: Insufficient documentation

## 2016-09-22 NOTE — Assessment & Plan Note (Addendum)
#   Pancytopenia- Asymptomatic. Today labs white count 2.8 hemoglobin 11.5 platelets of 99.  Secondary to cirrhosis/portal hypertension/splenomegaly. Patient continues to be asymptomatic at this time.  # Cirrhosis/portal hypertension- Question Karlene Lineman s/p Bx . Patient follows up with Dr.Wohl.   # Patient follow-up in  12 months with labs/AFP.  Will call with AFP results from today.   CC: Dr.Crissman.

## 2016-09-22 NOTE — Progress Notes (Signed)
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   Patient denies any new symptoms. Patient has intermittent occasional nosebleeds. Otherwise no bleeding tendencies. Denies any unusual swelling in the legs. Denies any unusual abdominal pain or abdominal swelling.   REVIEW OF SYSTEMS:  A complete 10 point review of system is done which is negative except mentioned above in history of present illness  I have reviewed the past medical history, past surgical history, social history and family history with the patient and they are unchanged from previous note unless stated above.  ALLERGIES:  is allergic to altace [ramipril].  MEDICATIONS:  Current Outpatient Prescriptions  Medication Sig Dispense Refill  . aspirin EC 81 MG tablet Take 81 mg by mouth daily.    . clobetasol cream (TEMOVATE) 0.05 %   1  . glipiZIDE (GLUCOTROL) 5 MG tablet Take 1 tablet (5 mg total) by mouth daily before breakfast. 90 tablet 4  . hydrochlorothiazide (HYDRODIURIL) 25 MG tablet Take 1 tablet (25 mg total) by mouth daily. 90 tablet 4  . losartan (COZAAR) 100 MG tablet Take 1 tablet (100 mg total) by mouth daily. 90 tablet 4  . metFORMIN (GLUCOPHAGE) 500 MG tablet Take 2 tablets (1,000 mg total) by mouth 2 (two) times daily with a meal. 360 tablet 4  . metoprolol succinate (TOPROL-XL) 50 MG 24 hr tablet Take 1 tablet (50 mg total) by mouth daily. Take with or immediately following a meal. 90 tablet 3  . omeprazole  (PRILOSEC) 20 MG capsule Take 20 mg by mouth daily. AM    . simvastatin (ZOCOR) 40 MG tablet Take 1 tablet (40 mg total) by mouth daily at 6 PM. 90 tablet 4  . tamsulosin (FLOMAX) 0.4 MG CAPS capsule Take 1 capsule (0.4 mg total) by mouth daily. 90 capsule 4  . tiZANidine (ZANAFLEX) 4 MG tablet Reported on 01/13/2016    . traMADol (ULTRAM) 50 MG tablet Reported on 02/09/2016     No current facility-administered medications for this visit.     PHYSICAL EXAMINATION:   BP 111/69 (BP Location: Left Arm, Patient Position: Sitting)   Pulse 95   Temp 98.1 F (36.7 C) (Oral)   Resp 18   Wt 245 lb 12.8 oz (111.5 kg)   SpO2 (!) 66%   BMI 35.27 kg/m   Filed Weights   09/22/16 1058  Weight: 245 lb 12.8 oz (111.5 kg)    GENERAL: Well-nourished well-developed; Alert, no distress and comfortable.   Accompanied by his wife. EYES: no pallor or icterus OROPHARYNX: no thrush or ulceration; good dentition  NECK: supple, no masses felt LYMPH:  no palpable lymphadenopathy in the cervical, axillary or inguinal regions LUNGS: clear to auscultation and  No wheeze or crackles HEART/CVS: regular rate & rhythm and no murmurs; No lower extremity edema ABDOMEN:abdomen soft, non-tender and normal bowel sounds Musculoskeletal:no cyanosis of digits and no clubbing; positive for splenomegaly  PSYCH: alert & oriented x 3 with fluent speech NEURO: no focal motor/sensory deficits SKIN:  no rashes or significant lesions  LABORATORY DATA:  I have reviewed the data as listed    Component Value Date/Time   NA 141 08/09/2016 1021   K 4.2 08/09/2016 1021   CL 104 08/09/2016 1021   CO2 22 08/09/2016 1021   GLUCOSE 83 08/09/2016 1021   GLUCOSE 140 (H) 06/25/2016 0305   BUN 23 08/09/2016 1021   CREATININE 1.09 08/09/2016 1021   CALCIUM 9.4 08/09/2016 1021   PROT 6.9 08/09/2016 1021   ALBUMIN 4.3 08/09/2016 1021   AST 37 08/09/2016 1021   AST 47 (H) 01/13/2016 0959   ALT 45 (H) 08/09/2016 1021   ALT 56 (H)  01/13/2016 0959   ALKPHOS 79 08/09/2016 1021   BILITOT 0.7 08/09/2016 1021   GFRNONAA 67 08/09/2016 1021   GFRAA 77 08/09/2016 1021    No results found for: SPEP, UPEP  Lab Results  Component Value Date   WBC 2.8 (L) 08/09/2016   NEUTROABS 1.3 (L) 08/09/2016   HGB 10.0 (L) 06/25/2016   HCT 33.0 (L) 08/09/2016   MCV 87 08/09/2016   PLT 91 (LL) 08/09/2016      Chemistry      Component Value Date/Time   NA 141 08/09/2016 1021   K 4.2 08/09/2016 1021   CL 104 08/09/2016 1021   CO2 22 08/09/2016 1021   BUN 23 08/09/2016 1021   CREATININE 1.09 08/09/2016 1021      Component Value Date/Time   CALCIUM 9.4 08/09/2016 1021   ALKPHOS 79 08/09/2016 1021   AST 37 08/09/2016 1021   AST 47 (H) 01/13/2016 0959   ALT 45 (H) 08/09/2016 1021   ALT 56 (H) 01/13/2016 0959   BILITOT 0.7 08/09/2016 1021       ASSESSMENT & PLAN:   Other pancytopenia ([place]) # Pancytopenia- Asymptomatic. Today labs white count 2.8 hemoglobin 11.5 platelets of 99.  Secondary to cirrhosis/portal hypertension/splenomegaly. Patient continues to be asymptomatic at this time.  # Cirrhosis/portal hypertension- Question Karlene Lineman s/p Bx . Patient follows up with Dr.Wohl.   # Patient follow-up in  12 months with labs/AFP.  Will call with AFP results from today.   CC: Dr.Crissman.       Cammie Sickle, MD 09/22/2016 4:00 PM

## 2016-09-22 NOTE — Progress Notes (Signed)
Patient is here today with his wife. He is doing well no complaints

## 2016-09-23 DIAGNOSIS — E113393 Type 2 diabetes mellitus with moderate nonproliferative diabetic retinopathy without macular edema, bilateral: Secondary | ICD-10-CM | POA: Diagnosis not present

## 2016-09-23 LAB — AFP TUMOR MARKER: AFP TUMOR MARKER: 1.3 ng/mL (ref 0.0–8.3)

## 2016-09-27 ENCOUNTER — Telehealth: Payer: Self-pay | Admitting: *Deleted

## 2016-09-27 NOTE — Telephone Encounter (Signed)
-----   Message from Cammie Sickle, MD sent at 09/27/2016  7:59 AM EST ----- Please inform patient that AFP is normal; no new recommendations;  follow-up as planned.

## 2016-09-27 NOTE — Telephone Encounter (Signed)
Spoke with patient's wife. Pt not available. Wife provided with lab results. Explained labs stable- and normal. Keep follow up as planned.

## 2016-10-13 ENCOUNTER — Ambulatory Visit: Payer: Medicare Other | Admitting: Family Medicine

## 2016-10-18 ENCOUNTER — Other Ambulatory Visit: Payer: Self-pay | Admitting: Family Medicine

## 2016-10-18 DIAGNOSIS — E785 Hyperlipidemia, unspecified: Secondary | ICD-10-CM

## 2016-10-18 DIAGNOSIS — I2510 Atherosclerotic heart disease of native coronary artery without angina pectoris: Secondary | ICD-10-CM

## 2016-10-18 DIAGNOSIS — I2583 Coronary atherosclerosis due to lipid rich plaque: Principal | ICD-10-CM

## 2016-11-23 ENCOUNTER — Encounter: Payer: Self-pay | Admitting: Family Medicine

## 2016-11-23 ENCOUNTER — Ambulatory Visit (INDEPENDENT_AMBULATORY_CARE_PROVIDER_SITE_OTHER): Payer: Medicare Other | Admitting: Family Medicine

## 2016-11-23 VITALS — BP 123/71 | HR 72 | Ht 71.0 in | Wt 246.0 lb

## 2016-11-23 DIAGNOSIS — E119 Type 2 diabetes mellitus without complications: Secondary | ICD-10-CM

## 2016-11-23 DIAGNOSIS — I10 Essential (primary) hypertension: Secondary | ICD-10-CM | POA: Diagnosis not present

## 2016-11-23 DIAGNOSIS — E785 Hyperlipidemia, unspecified: Secondary | ICD-10-CM | POA: Diagnosis not present

## 2016-11-23 LAB — BAYER DCA HB A1C WAIVED: HB A1C: 6.8 % (ref <7.0)

## 2016-11-23 NOTE — Assessment & Plan Note (Signed)
The current medical regimen is effective;  continue present plan and medications.  

## 2016-11-23 NOTE — Progress Notes (Signed)
   BP 123/71   Pulse 72   Ht 5\' 11"  (1.803 m)   Wt 246 lb (111.6 kg)   SpO2 99%   BMI 34.31 kg/m    Subjective:    Patient ID: Mason Chute., male    DOB: [birth year], 76 y.o.   MRN: [id number]  HPI: Mason Wilson is a 76 y.o. male  Chief Complaint  Patient presents with  . Follow-up  . Diabetes   Doing well with diabetes noted low blood sugar spells medications doing well with no side effects. The pressure good control no issues with medications Cholesterol doing well also with no issues.  Relevant past medical, surgical, family and social history reviewed and updated as indicated. Interim medical history since our last visit reviewed. Allergies and medications reviewed and updated.  Review of Systems  Constitutional: Negative.   Respiratory: Negative.   Cardiovascular: Negative.     Per HPI unless specifically indicated above     Objective:    BP 123/71   Pulse 72   Ht 5\' 11"  (1.803 m)   Wt 246 lb (111.6 kg)   SpO2 99%   BMI 34.31 kg/m   Wt Readings from Last 3 Encounters:  11/23/16 246 lb (111.6 kg)  09/22/16 245 lb 12.8 oz (111.5 kg)  08/09/16 243 lb (110.2 kg)    Physical Exam  Constitutional: He is oriented to person, place, and time. He appears well-developed and well-nourished. No distress.  HENT:  Head: Normocephalic and atraumatic.  Right Ear: Hearing normal.  Left Ear: Hearing normal.  Nose: Nose normal.  Eyes: Conjunctivae and lids are normal. Right eye exhibits no discharge. Left eye exhibits no discharge. No scleral icterus.  Cardiovascular: Normal rate, regular rhythm and normal heart sounds.   Pulmonary/Chest: Effort normal and breath sounds normal. No respiratory distress.  Musculoskeletal: Normal range of motion.  Neurological: He is alert and oriented to person, place, and time.  Skin: Skin is intact. No rash noted.  Psychiatric: He has a normal mood and affect. His speech is normal and behavior is normal. Judgment and thought  content normal. Cognition and memory are normal.    Results for orders placed or performed in visit on 09/22/16  AFP tumor marker  Result Value Ref Range   AFP-Tumor Marker 1.3 0.0 - 8.3 ng/mL      Assessment & Plan:   Problem List Items Addressed This Visit      Cardiovascular and Mediastinum   Essential hypertension    The current medical regimen is effective;  continue present plan and medications.       Relevant Orders   Bayer DCA Hb A1c Waived (STAT)     Endocrine   Diabetes mellitus without complication ([place]) - Primary    The current medical regimen is effective;  continue present plan and medications.       Relevant Orders   Bayer DCA Hb A1c Waived (STAT)     Other   Hyperlipidemia    The current medical regimen is effective;  continue present plan and medications.       Relevant Orders   Bayer DCA Hb A1c Waived (STAT)       Follow up plan: Return in about 3 months (around 02/21/2017) for Hemoglobin A1c, BMP,  Lipids, ALT, AST.

## 2016-12-01 DIAGNOSIS — L820 Inflamed seborrheic keratosis: Secondary | ICD-10-CM | POA: Diagnosis not present

## 2016-12-01 DIAGNOSIS — L299 Pruritus, unspecified: Secondary | ICD-10-CM | POA: Diagnosis not present

## 2016-12-01 DIAGNOSIS — I10 Essential (primary) hypertension: Secondary | ICD-10-CM | POA: Diagnosis not present

## 2016-12-01 DIAGNOSIS — L309 Dermatitis, unspecified: Secondary | ICD-10-CM | POA: Diagnosis not present

## 2016-12-01 DIAGNOSIS — E119 Type 2 diabetes mellitus without complications: Secondary | ICD-10-CM | POA: Diagnosis not present

## 2016-12-01 DIAGNOSIS — L259 Unspecified contact dermatitis, unspecified cause: Secondary | ICD-10-CM | POA: Diagnosis not present

## 2016-12-08 DIAGNOSIS — C8400 Mycosis fungoides, unspecified site: Secondary | ICD-10-CM | POA: Diagnosis not present

## 2016-12-08 DIAGNOSIS — L308 Other specified dermatitis: Secondary | ICD-10-CM | POA: Diagnosis not present

## 2016-12-10 DIAGNOSIS — C8400 Mycosis fungoides, unspecified site: Secondary | ICD-10-CM | POA: Diagnosis not present

## 2016-12-13 DIAGNOSIS — C8400 Mycosis fungoides, unspecified site: Secondary | ICD-10-CM | POA: Diagnosis not present

## 2016-12-16 DIAGNOSIS — C8400 Mycosis fungoides, unspecified site: Secondary | ICD-10-CM | POA: Diagnosis not present

## 2016-12-20 DIAGNOSIS — C8400 Mycosis fungoides, unspecified site: Secondary | ICD-10-CM | POA: Diagnosis not present

## 2016-12-22 DIAGNOSIS — C8400 Mycosis fungoides, unspecified site: Secondary | ICD-10-CM | POA: Diagnosis not present

## 2016-12-28 DIAGNOSIS — C8400 Mycosis fungoides, unspecified site: Secondary | ICD-10-CM | POA: Diagnosis not present

## 2016-12-30 DIAGNOSIS — C8400 Mycosis fungoides, unspecified site: Secondary | ICD-10-CM | POA: Diagnosis not present

## 2017-01-03 DIAGNOSIS — C8400 Mycosis fungoides, unspecified site: Secondary | ICD-10-CM | POA: Diagnosis not present

## 2017-01-06 DIAGNOSIS — C8400 Mycosis fungoides, unspecified site: Secondary | ICD-10-CM | POA: Diagnosis not present

## 2017-01-10 DIAGNOSIS — C8400 Mycosis fungoides, unspecified site: Secondary | ICD-10-CM | POA: Diagnosis not present

## 2017-01-14 DIAGNOSIS — C8400 Mycosis fungoides, unspecified site: Secondary | ICD-10-CM | POA: Diagnosis not present

## 2017-01-17 DIAGNOSIS — C8400 Mycosis fungoides, unspecified site: Secondary | ICD-10-CM | POA: Diagnosis not present

## 2017-01-19 DIAGNOSIS — C8400 Mycosis fungoides, unspecified site: Secondary | ICD-10-CM | POA: Diagnosis not present

## 2017-01-24 DIAGNOSIS — C8400 Mycosis fungoides, unspecified site: Secondary | ICD-10-CM | POA: Diagnosis not present

## 2017-01-28 DIAGNOSIS — C8400 Mycosis fungoides, unspecified site: Secondary | ICD-10-CM | POA: Diagnosis not present

## 2017-02-07 DIAGNOSIS — C8400 Mycosis fungoides, unspecified site: Secondary | ICD-10-CM | POA: Diagnosis not present

## 2017-02-08 ENCOUNTER — Encounter: Payer: Self-pay | Admitting: Family Medicine

## 2017-02-08 ENCOUNTER — Ambulatory Visit (INDEPENDENT_AMBULATORY_CARE_PROVIDER_SITE_OTHER): Payer: Medicare Other | Admitting: Family Medicine

## 2017-02-08 VITALS — BP 112/67 | HR 93 | Wt 255.0 lb

## 2017-02-08 DIAGNOSIS — E785 Hyperlipidemia, unspecified: Secondary | ICD-10-CM | POA: Diagnosis not present

## 2017-02-08 DIAGNOSIS — I10 Essential (primary) hypertension: Secondary | ICD-10-CM | POA: Diagnosis not present

## 2017-02-08 DIAGNOSIS — E119 Type 2 diabetes mellitus without complications: Secondary | ICD-10-CM

## 2017-02-08 DIAGNOSIS — J019 Acute sinusitis, unspecified: Secondary | ICD-10-CM

## 2017-02-08 DIAGNOSIS — I2583 Coronary atherosclerosis due to lipid rich plaque: Secondary | ICD-10-CM

## 2017-02-08 DIAGNOSIS — G4730 Sleep apnea, unspecified: Secondary | ICD-10-CM

## 2017-02-08 DIAGNOSIS — I2510 Atherosclerotic heart disease of native coronary artery without angina pectoris: Secondary | ICD-10-CM

## 2017-02-08 LAB — LP+ALT+AST PICCOLO, WAIVED
ALT (SGPT) PICCOLO, WAIVED: 53 U/L — AB (ref 10–47)
AST (SGOT) Piccolo, Waived: 46 U/L — ABNORMAL HIGH (ref 11–38)
Chol/HDL Ratio Piccolo,Waive: 3 mg/dL
Cholesterol Piccolo, Waived: 111 mg/dL (ref <200)
HDL CHOL PICCOLO, WAIVED: 38 mg/dL — AB (ref >59)
LDL CHOL CALC PICCOLO WAIVED: 53 mg/dL (ref <100)
TRIGLYCERIDES PICCOLO,WAIVED: 105 mg/dL (ref <150)
VLDL CHOL CALC PICCOLO,WAIVE: 21 mg/dL (ref <30)

## 2017-02-08 LAB — HEMOGLOBIN A1C
Est. average glucose Bld gHb Est-mCnc: 146 mg/dL
Hgb A1c MFr Bld: 6.7 % — ABNORMAL HIGH (ref 4.8–5.6)

## 2017-02-08 MED ORDER — AMOXICILLIN-POT CLAVULANATE 875-125 MG PO TABS: 1.0000 | ORAL_TABLET | Freq: Two times a day (BID) | ORAL | 0 refills | Status: DC

## 2017-02-08 NOTE — Assessment & Plan Note (Signed)
The current medical regimen is effective;  continue present plan and medications.  

## 2017-02-08 NOTE — Assessment & Plan Note (Signed)
Discuss ongoing need for CPAP and patient uses been faithful and regular over the last 18 years machine is finally broken and needs a new one will give prescription for new machine and also supplies.

## 2017-02-08 NOTE — Progress Notes (Signed)
BP 112/67   Pulse 93   Wt 255 lb (115.7 kg)   SpO2 95%   BMI 35.57 kg/m    Subjective:    Patient ID: Mason Chute., male    DOB: August 03, 1941, 76 y.o.   MRN: [id number]  HPI: Mason Wilson is a 76 y.o. male  Chief Complaint  Patient presents with  . CPAP  . Diabetes  . Hyperlipidemia  . Hypertension  Patient follow-up. Diabetes doing well with no low blood sugar spells or issues with medications. Blood pressure also doing well as his cholesterol no complaints or concerns taking medicines faithfully. No chest pain symptoms or cardiac symptoms. BPH doing well with Flomax. Patient's concerns had 3 weeks of facial pressure congestion drainage. Has kind of come and gone has taken some over-the-counter without really any success.  Patient also with sleep apnea has faithfully used his CPAP machine since 2000. Machine is now not functioning and patient needs to have a new CPAP machine. He has a diagnosis of obstructive sleep apnea will need a new machine and also supplies.  Relevant past medical, surgical, family and social history reviewed and updated as indicated. Interim medical history since our last visit reviewed. Allergies and medications reviewed and updated.  Review of Systems  Constitutional: Positive for diaphoresis and fatigue.  HENT: Positive for congestion, postnasal drip, rhinorrhea, sinus pain, sinus pressure and sore throat.   Respiratory: Negative for cough and choking.   Cardiovascular: Negative for chest pain and leg swelling.    Per HPI unless specifically indicated above     Objective:    BP 112/67   Pulse 93   Wt 255 lb (115.7 kg)   SpO2 95%   BMI 35.57 kg/m   Wt Readings from Last 3 Encounters:  02/08/17 255 lb (115.7 kg)  11/23/16 246 lb (111.6 kg)  09/22/16 245 lb 12.8 oz (111.5 kg)    Physical Exam  Constitutional: He is oriented to person, place, and time. He appears well-developed and well-nourished.  HENT:  Head: Normocephalic  and atraumatic.  Right Ear: External ear normal.  Left Ear: External ear normal.  Mouth/Throat: Oropharyngeal exudate present.  Eyes: Conjunctivae and EOM are normal.  Neck: Normal range of motion.  Cardiovascular: Normal rate, regular rhythm and normal heart sounds.   Pulmonary/Chest: Effort normal and breath sounds normal.  Musculoskeletal: Normal range of motion.  Neurological: He is alert and oriented to person, place, and time.  Skin: No erythema.  Psychiatric: He has a normal mood and affect. His behavior is normal. Judgment and thought content normal.    Results for orders placed or performed in visit on 11/23/16  Bayer DCA Hb A1c Waived (STAT)  Result Value Ref Range   Bayer DCA Hb A1c Waived 6.8 <7.0 %      Assessment & Plan:   Problem List Items Addressed This Visit      Cardiovascular and Mediastinum   CAD (coronary artery disease)    The current medical regimen is effective;  continue present plan and medications.       Essential hypertension - Primary   Relevant Orders   Basic metabolic panel   LP+ALT+AST Piccolo, Waived   Hemoglobin A1c     Respiratory   Sleep apnea    Discuss ongoing need for CPAP and patient uses been faithful and regular over the last 18 years machine is finally broken and needs a new one will give prescription for new machine and also supplies.  Endocrine   Diabetes mellitus without complication ([place])   Relevant Orders   Basic metabolic panel   LP+ALT+AST Piccolo, Waived   Hemoglobin A1c     Other   Hyperlipidemia   Relevant Orders   Basic metabolic panel   LP+ALT+AST Piccolo, Waived   Hemoglobin A1c    Other Visit Diagnoses    Acute sinusitis, recurrence not specified, unspecified location       Discussed sinusitis care and treatment use of antibiotics over-the-counter medications allergy treatments nasal rinse.   Relevant Medications   amoxicillin-clavulanate (AUGMENTIN) 875-125 MG tablet       Follow up  plan: Return in about 3 months (around 05/10/2017) for Hemoglobin A1c.

## 2017-02-09 ENCOUNTER — Encounter: Payer: Self-pay | Admitting: Family Medicine

## 2017-02-09 LAB — BASIC METABOLIC PANEL
BUN / CREAT RATIO: 21 (ref 10–24)
BUN: 22 mg/dL (ref 8–27)
CO2: 21 mmol/L (ref 18–29)
CREATININE: 1.03 mg/dL (ref 0.76–1.27)
Calcium: 9.3 mg/dL (ref 8.6–10.2)
Chloride: 102 mmol/L (ref 96–106)
GFR, EST AFRICAN AMERICAN: 82 mL/min/{1.73_m2} (ref >59)
GFR, EST NON AFRICAN AMERICAN: 71 mL/min/{1.73_m2} (ref >59)
Glucose: 82 mg/dL (ref 65–99)
Potassium: 4.3 mmol/L (ref 3.5–5.2)
SODIUM: 140 mmol/L (ref 134–144)

## 2017-02-10 DIAGNOSIS — C8400 Mycosis fungoides, unspecified site: Secondary | ICD-10-CM | POA: Diagnosis not present

## 2017-02-11 ENCOUNTER — Other Ambulatory Visit: Payer: Self-pay

## 2017-02-14 ENCOUNTER — Telehealth: Payer: Self-pay

## 2017-02-14 DIAGNOSIS — G4733 Obstructive sleep apnea (adult) (pediatric): Secondary | ICD-10-CM

## 2017-02-14 NOTE — Telephone Encounter (Signed)
Pt and wife are going to look for sleep study results. Will call back if they find it or wish to pursue Sleep study.

## 2017-02-14 NOTE — Telephone Encounter (Signed)
Pt requested new CPAP supplies at last OV. Order sent to [facility] in [place] (ph: [phone] fax: [phone]). Received fax back requesting Sleep Study results. Reviewed chart, could not find results, did note encounters referencing sleep study done in 2000. Left VM for [facility] seeing how old studies were accepted. Spoke with wife last sleep study was the one done in 2000 @ [facility]. Did call Medical records and [facility] Sleep department, and they do not keep the records past 7 years. Pt will need to have another sleep study done. Okay to place? Please advise.

## 2017-02-14 NOTE — Telephone Encounter (Signed)
OK to place order. I'll sign when needed

## 2017-02-14 NOTE — Telephone Encounter (Signed)
[facility] did return my call. They stated that if they can get original sleep study another will not be needed. We cannot get. Called pt and left vm to return call to find out if they happened to have report, if not we will have to start process over.

## 2017-02-15 NOTE — Telephone Encounter (Signed)
Order in.

## 2017-02-15 NOTE — Addendum Note (Signed)
Addended by: Valerie Roys on: 02/15/2017 04:53 PM   Modules accepted: Orders

## 2017-02-15 NOTE — Telephone Encounter (Signed)
Cannot find old sleep study results. Would like to go ahead and repeat Sleep Study. Please advise.

## 2017-02-21 DIAGNOSIS — L821 Other seborrheic keratosis: Secondary | ICD-10-CM | POA: Diagnosis not present

## 2017-02-21 DIAGNOSIS — D692 Other nonthrombocytopenic purpura: Secondary | ICD-10-CM | POA: Diagnosis not present

## 2017-02-21 DIAGNOSIS — L918 Other hypertrophic disorders of the skin: Secondary | ICD-10-CM | POA: Diagnosis not present

## 2017-02-21 DIAGNOSIS — L578 Other skin changes due to chronic exposure to nonionizing radiation: Secondary | ICD-10-CM | POA: Diagnosis not present

## 2017-02-21 DIAGNOSIS — Z1283 Encounter for screening for malignant neoplasm of skin: Secondary | ICD-10-CM | POA: Diagnosis not present

## 2017-02-21 DIAGNOSIS — D229 Melanocytic nevi, unspecified: Secondary | ICD-10-CM | POA: Diagnosis not present

## 2017-02-21 DIAGNOSIS — Z85820 Personal history of malignant melanoma of skin: Secondary | ICD-10-CM | POA: Diagnosis not present

## 2017-02-21 DIAGNOSIS — L812 Freckles: Secondary | ICD-10-CM | POA: Diagnosis not present

## 2017-02-21 DIAGNOSIS — C44319 Basal cell carcinoma of skin of other parts of face: Secondary | ICD-10-CM | POA: Diagnosis not present

## 2017-02-21 DIAGNOSIS — D485 Neoplasm of uncertain behavior of skin: Secondary | ICD-10-CM | POA: Diagnosis not present

## 2017-02-21 DIAGNOSIS — L720 Epidermal cyst: Secondary | ICD-10-CM | POA: Diagnosis not present

## 2017-02-21 DIAGNOSIS — L300 Nummular dermatitis: Secondary | ICD-10-CM | POA: Diagnosis not present

## 2017-02-23 ENCOUNTER — Ambulatory Visit: Payer: Medicare Other | Admitting: Family Medicine

## 2017-03-15 ENCOUNTER — Other Ambulatory Visit: Payer: Self-pay | Admitting: Family Medicine

## 2017-03-15 DIAGNOSIS — C44319 Basal cell carcinoma of skin of other parts of face: Secondary | ICD-10-CM | POA: Diagnosis not present

## 2017-03-15 NOTE — Telephone Encounter (Signed)
.   Last OV: 02/08/17 Next OV: 7\/3/18  BMP Latest Ref Rng & Units 02/08/2017 08/09/2016 06/25/2016  Glucose 65 - 99 mg/dL 82 83 140(H)  BUN 8 - 27 mg/dL 22 23 22(H)  Creatinine 0.76 - 1.27 mg/dL 1.03 1.09 1.02  BUN/Creat Ratio 10 - 24 21 21  -  Sodium 134 - 144 mmol/L 140 141 136  Potassium 3.5 - 5.2 mmol/L 4.3 4.2 4.2  Chloride 96 - 106 mmol/L 102 104 108  CO2 18 - 29 mmol/L 21 22 23   Calcium 8.6 - 10.2 mg/dL 9.3 9.4 8.8(L)

## 2017-03-17 DIAGNOSIS — E669 Obesity, unspecified: Secondary | ICD-10-CM | POA: Diagnosis not present

## 2017-03-17 DIAGNOSIS — G4733 Obstructive sleep apnea (adult) (pediatric): Secondary | ICD-10-CM | POA: Diagnosis not present

## 2017-03-23 DIAGNOSIS — C44319 Basal cell carcinoma of skin of other parts of face: Secondary | ICD-10-CM | POA: Diagnosis not present

## 2017-03-25 DIAGNOSIS — G4733 Obstructive sleep apnea (adult) (pediatric): Secondary | ICD-10-CM | POA: Diagnosis not present

## 2017-04-06 DIAGNOSIS — G4733 Obstructive sleep apnea (adult) (pediatric): Secondary | ICD-10-CM | POA: Diagnosis not present

## 2017-04-13 ENCOUNTER — Telehealth: Payer: Self-pay | Admitting: Family Medicine

## 2017-04-13 NOTE — Telephone Encounter (Signed)
Called pt to schedule Annual Wellness Visit with Nurse Health Advisor for June:  - knb ° °

## 2017-04-14 NOTE — Telephone Encounter (Signed)
Pt's wife returned call scheduled awv for June 22

## 2017-04-29 ENCOUNTER — Ambulatory Visit (INDEPENDENT_AMBULATORY_CARE_PROVIDER_SITE_OTHER): Payer: Medicare Other

## 2017-04-29 VITALS — BP 128/60 | HR 68 | Temp 98.6°F | Resp 16 | Ht 70.0 in | Wt 255.7 lb

## 2017-04-29 DIAGNOSIS — Z0000 Encounter for general adult medical examination without abnormal findings: Secondary | ICD-10-CM

## 2017-04-29 NOTE — Patient Instructions (Addendum)
Mason Wilson , Thank you for taking time to come for your Medicare Wellness Visit. I appreciate your ongoing commitment to your health goals. Please review the following plan we discussed and let me know if I can assist you in the future.   Screening recommendations/referrals: Colonoscopy: Completed 08/08/2014 Recommended yearly ophthalmology/optometry visit for glaucoma screening and checkup Recommended yearly dental visit for hygiene and checkup  Vaccinations: Influenza vaccine: up to date, due 08/2017 Pneumococcal vaccine: up to date Tdap vaccine: up to date  Shingles vaccine: up to date   Advanced directives: Copy of Advanced Directives in chart, your requested no changes at this time   Conditions/risks identified: Recommend drinking at least 4-5 glasses of water a day  Next appointment: Follow up on 05/10/2017 at 8:45 with Mason Wilson. Follow up in one year for your annual wellness exam.  Preventive Care 65 Years and Older, Male Preventive care refers to lifestyle choices and visits with your health care provider that can promote health and wellness. What does preventive care include?  A yearly physical exam. This is also called an annual well check.  Dental exams once or twice a year.  Routine eye exams. Ask your health care provider how often you should have your eyes checked.  Personal lifestyle choices, including:  Daily care of your teeth and gums.  Regular physical activity.  Eating a healthy diet.  Avoiding tobacco and drug use.  Limiting alcohol use.  Practicing safe sex.  Taking low doses of aspirin every day.  Taking vitamin and mineral supplements as recommended by your health care provider. What happens during an annual well check? The services and screenings done by your health care provider during your annual well check will depend on your age, overall health, lifestyle risk factors, and family history of disease. Counseling  Your health care provider  may ask you questions about your:  Alcohol use.  Tobacco use.  Drug use.  Emotional well-being.  Home and relationship well-being.  Sexual activity.  Eating habits.  History of falls.  Memory and ability to understand (cognition).  Work and work Statistician. Screening  You may have the following tests or measurements:  Height, weight, and BMI.  Blood pressure.  Lipid and cholesterol levels. These may be checked every 5 years, or more frequently if you are over 40 years old.  Skin check.  Lung cancer screening. You may have this screening every year starting at age 47 if you have a 30-pack-year history of smoking and currently smoke or have quit within the past 15 years.  Fecal occult blood test (FOBT) of the stool. You may have this test every year starting at age 50.  Flexible sigmoidoscopy or colonoscopy. You may have a sigmoidoscopy every 5 years or a colonoscopy every 10 years starting at age 82.  Prostate cancer screening. Recommendations will vary depending on your family history and other risks.  Hepatitis C blood test.  Hepatitis B blood test.  Sexually transmitted disease (STD) testing.  Diabetes screening. This is done by checking your blood sugar (glucose) after you have not eaten for a while (fasting). You may have this done every 1-3 years.  Abdominal aortic aneurysm (AAA) screening. You may need this if you are a current or former smoker.  Osteoporosis. You may be screened starting at age 78 if you are at high risk. Talk with your health care provider about your test results, treatment options, and if necessary, the need for more tests. Vaccines  Your health care  provider may recommend certain vaccines, such as:  Influenza vaccine. This is recommended every year.  Tetanus, diphtheria, and acellular pertussis (Tdap, Td) vaccine. You may need a Td booster every 10 years.  Zoster vaccine. You may need this after age 47.  Pneumococcal 13-valent  conjugate (PCV13) vaccine. One dose is recommended after age 31.  Pneumococcal polysaccharide (PPSV23) vaccine. One dose is recommended after age 50. Talk to your health care provider about which screenings and vaccines you need and how often you need them. This information is not intended to replace advice given to you by your health care provider. Make sure you discuss any questions you have with your health care provider. Document Released: 11/21/2015 Document Revised: 07/14/2016 Document Reviewed: 08/26/2015 Elsevier Interactive Patient Education  2017 [place] Prevention in the Home Falls can cause injuries. They can happen to people of all ages. There are many things you can do to make your home safe and to help prevent falls. What can I do on the outside of my home?  Regularly fix the edges of walkways and driveways and fix any cracks.  Remove anything that might make you trip as you walk through a door, such as a raised step or threshold.  Trim any bushes or trees on the path to your home.  Use bright outdoor lighting.  Clear any walking paths of anything that might make someone trip, such as rocks or tools.  Regularly check to see if handrails are loose or broken. Make sure that both sides of any steps have handrails.  Any raised decks and porches should have guardrails on the edges.  Have any leaves, snow, or ice cleared regularly.  Use sand or salt on walking paths during winter.  Clean up any spills in your garage right away. This includes oil or grease spills. What can I do in the bathroom?  Use night lights.  Install grab bars by the toilet and in the tub and shower. Do not use towel bars as grab bars.  Use non-skid mats or decals in the tub or shower.  If you need to sit down in the shower, use a plastic, non-slip stool.  Keep the floor dry. Clean up any water that spills on the floor as soon as it happens.  Remove soap buildup in the tub or  shower regularly.  Attach bath mats securely with double-sided non-slip rug tape.  Do not have throw rugs and other things on the floor that can make you trip. What can I do in the bedroom?  Use night lights.  Make sure that you have a light by your bed that is easy to reach.  Do not use any sheets or blankets that are too big for your bed. They should not hang down onto the floor.  Have a firm chair that has side arms. You can use this for support while you get dressed.  Do not have throw rugs and other things on the floor that can make you trip. What can I do in the kitchen?  Clean up any spills right away.  Avoid walking on wet floors.  Keep items that you use a lot in easy-to-reach places.  If you need to reach something above you, use a strong step stool that has a grab bar.  Keep electrical cords out of the way.  Do not use floor polish or wax that makes floors slippery. If you must use wax, use non-skid floor wax.  Do not have throw  rugs and other things on the floor that can make you trip. What can I do with my stairs?  Do not leave any items on the stairs.  Make sure that there are handrails on both sides of the stairs and use them. Fix handrails that are broken or loose. Make sure that handrails are as long as the stairways.  Check any carpeting to make sure that it is firmly attached to the stairs. Fix any carpet that is loose or worn.  Avoid having throw rugs at the top or bottom of the stairs. If you do have throw rugs, attach them to the floor with carpet tape.  Make sure that you have a light switch at the top of the stairs and the bottom of the stairs. If you do not have them, ask someone to add them for you. What else can I do to help prevent falls?  Wear shoes that:  Do not have high heels.  Have rubber bottoms.  Are comfortable and fit you well.  Are closed at the toe. Do not wear sandals.  If you use a stepladder:  Make sure that it is fully  opened. Do not climb a closed stepladder.  Make sure that both sides of the stepladder are locked into place.  Ask someone to hold it for you, if possible.  Clearly mark and make sure that you can see:  Any grab bars or handrails.  First and last steps.  Where the edge of each step is.  Use tools that help you move around (mobility aids) if they are needed. These include:  Canes.  Walkers.  Scooters.  Crutches.  Turn on the lights when you go into a dark area. Replace any light bulbs as soon as they burn out.  Set up your furniture so you have a clear path. Avoid moving your furniture around.  If any of your floors are uneven, fix them.  If there are any pets around you, be aware of where they are.  Review your medicines with your doctor. Some medicines can make you feel dizzy. This can increase your chance of falling. Ask your doctor what other things that you can do to help prevent falls. This information is not intended to replace advice given to you by your health care provider. Make sure you discuss any questions you have with your health care provider. Document Released: 08/21/2009 Document Revised: 04/01/2016 Document Reviewed: 11/29/2014 Elsevier Interactive Patient Education  2017 [employer].

## 2017-04-29 NOTE — Progress Notes (Signed)
Subjective:   Mason Wilson. is a 76 y.o. male who presents for Medicare Annual/Subsequent preventive examination.  Review of Systems:   Cardiac Risk Factors include: advanced age (>16men, >37 women);male gender;hypertension;dyslipidemia;diabetes mellitus;smoking/ tobacco exposure;obesity (BMI >30kg/m2)     Objective:    Vitals: BP 128/60 (BP Location: Left Arm, Patient Position: Sitting)   Pulse 68   Temp 98.6 F (37 C)   Resp 16   Ht 5\' 10"  (1.778 m)   Wt 255 lb 11.2 oz (116 kg)   BMI 36.69 kg/m   Body mass index is 36.69 kg/m.  Tobacco History  Smoking Status  . Former Smoker  . Types: Cigarettes  . Quit date: 04/02/1970  Smokeless Tobacco  . Current User  . Types: Chew    Comment: 1974     Ready to quit: Not Answered Counseling given: Not Answered   Past Medical History:  Diagnosis Date  . Allergy   . Arthritis    "everywhere' - worse in back  . Body mass index 39.0-39.9, adult   . CAD (coronary artery disease)   . Chronic back pain   . Diabetes mellitus without complication ([place])   . Elevated liver enzymes   . GERD (gastroesophageal reflux disease)   . Heart murmur   . Hyperlipidemia   . Hypogonadism in male   . Melanoma ([place])    melanoma   . Melanoma in situ of right shoulder ([place])   . Renal stones   . Sleep apnea    CPAP  . Uses hearing aid    doesn't wear   Past Surgical History:  Procedure Laterality Date  . ANGIOPLASTY  1999   3 stents  . CARPAL TUNNEL RELEASE     x2  . CATARACT EXTRACTION, BILATERAL    . ESOPHAGOGASTRODUODENOSCOPY (EGD) WITH PROPOFOL N/A 09/08/2015   Procedure: ESOPHAGOGASTRODUODENOSCOPY (EGD) WITH PROPOFOL;  Surgeon: Lucilla Lame, MD;  Location: [facility];  Service: Endoscopy;  Laterality: N/A;  CPAP Diabetic - oral meds  . LIVER BIOPSY    . SKIN LESION EXCISION  1998  . SKIN LESION EXCISION     skin cancer removed on forehead in June 2018  . TONSILLECTOMY     age 28   Family History  Problem  Relation Age of Onset  . Thyroid disease Mother   . Alzheimer's disease Mother   . Heart disease Mother   . Cancer Father 68       testicular  . Heart disease Father   . Hypertension Father   . Heart attack Father   . Kidney disease Neg Hx   . Prostate cancer Neg Hx    History  Sexual Activity  . Sexual activity: Yes  . Partners: Female    Outpatient Encounter Prescriptions as of 04/29/2017  Medication Sig  . amoxicillin-clavulanate (AUGMENTIN) 875-125 MG tablet Take 1 tablet by mouth 2 (two) times daily.  Marland Kitchen aspirin EC 81 MG tablet Take 81 mg by mouth daily.  . clobetasol cream (TEMOVATE) 0.05 %   . glipiZIDE (GLUCOTROL) 5 MG tablet Take 1 tablet (5 mg total) by mouth daily before breakfast.  . hydrochlorothiazide (HYDRODIURIL) 25 MG tablet Take 1 tablet (25 mg total) by mouth daily.  Marland Kitchen losartan (COZAAR) 100 MG tablet Take 1 tablet (100 mg total) by mouth daily.  . metFORMIN (GLUCOPHAGE) 500 MG tablet Take 2 tablets (1,000 mg total) by mouth 2 (two) times daily with a meal.  . metoprolol succinate (TOPROL-XL) 50 MG 24  hr tablet Take 1 tablet (50 mg total) by mouth daily. Take with or immediately following a meal.  . omeprazole (PRILOSEC) 20 MG capsule Take 20 mg by mouth daily. AM  . simvastatin (ZOCOR) 40 MG tablet Take 1 tablet (40 mg total) by mouth daily at 6 PM.  . tamsulosin (FLOMAX) 0.4 MG CAPS capsule Take 1 capsule (0.4 mg total) by mouth daily.  Marland Kitchen tiZANidine (ZANAFLEX) 4 MG tablet Take 4 mg by mouth every 6 (six) hours as needed for muscle spasms.  . traMADol (ULTRAM) 50 MG tablet Take by mouth every 6 (six) hours as needed.   No facility-administered encounter medications on file as of 04/29/2017.     Activities of Daily Living In your present state of health, do you have any difficulty performing the following activities: 04/29/2017 06/23/2016  Hearing? Tempie Donning  Vision? N N  Difficulty concentrating or making decisions? N N  Walking or climbing stairs? N N  Dressing or  bathing? N N  Doing errands, shopping? N N  Preparing Food and eating ? N -  Using the Toilet? N -  In the past six months, have you accidently leaked urine? N -  Do you have problems with loss of bowel control? N -  Managing your Medications? N -  Managing your Finances? N -  Housekeeping or managing your Housekeeping? N -  Some recent data might be hidden    Patient Care Team: [name], MD as PCP - General (Family Medicine) Lucilla Lame, MD as Consulting Physician (Gastroenterology) Cammie Sickle, MD as Consulting Physician (Hematology and Oncology)   Assessment:     Exercise Activities and Dietary recommendations Current Exercise Habits: The patient does not participate in regular exercise at present  Goals    . Increase water intake          Recommend drinking at least 4-5 glasses of water a day      Fall Risk Fall Risk  04/29/2017 02/08/2017 11/23/2016 07/19/2016 07/08/2015  Falls in the past year? No No No No No   Depression Screen PHQ 2/9 Scores 04/29/2017 07/19/2016 07/08/2015  PHQ - 2 Score 0 0 0    Cognitive Function     6CIT Screen 04/29/2017  What Year? 0 points  What month? 0 points  What time? 0 points  Count back from 20 0 points  Months in reverse 0 points  Repeat phrase 0 points  Total Score 0    Immunization History  Administered Date(s) Administered  . Influenza, High Dose Seasonal PF 08/09/2016  . Influenza,inj,Quad PF,36+ Mos 07/28/2015  . Influenza-Unspecified 08/08/2014  . Pneumococcal Conjugate-13 04/18/2014  . Pneumococcal-Unspecified 07/09/1998, 04/17/2007  . Td 04/22/2008  . Zoster 04/08/2009   Screening Tests Health Maintenance  Topic Date Due  . OPHTHALMOLOGY EXAM  05/18/2017  . INFLUENZA VACCINE  06/08/2017  . HEMOGLOBIN A1C  08/10/2017  . FOOT EXAM  02/08/2018  . TETANUS/TDAP  04/22/2018  . COLONOSCOPY  08/08/2024      Plan:    I have personally reviewed and addressed the Medicare Annual Wellness  questionnaire and have noted the following in the patient's chart:  A. Medical and social history B. Use of alcohol, tobacco or illicit drugs  C. Current medications and supplements D. Functional ability and status E.  Nutritional status F.  Physical activity G. Advance directives H. List of other physicians I.  Hospitalizations, surgeries, and ER visits in previous 12 months J.  [place] such as  hearing and vision if needed, cognitive and depression L. Referrals and appointments  In addition, I have reviewed and discussed with patient certain preventive protocols, quality metrics, and best practice recommendations. A written personalized care plan for preventive services as well as general preventive health recommendations were provided to patient.   Signed,  Tyler Aas, LPN Nurse Health Advisor   MD Recommendations: none

## 2017-05-10 ENCOUNTER — Ambulatory Visit (INDEPENDENT_AMBULATORY_CARE_PROVIDER_SITE_OTHER): Payer: Medicare Other | Admitting: Family Medicine

## 2017-05-10 VITALS — BP 111/66 | HR 75 | Wt 259.0 lb

## 2017-05-10 DIAGNOSIS — E785 Hyperlipidemia, unspecified: Secondary | ICD-10-CM | POA: Diagnosis not present

## 2017-05-10 DIAGNOSIS — I10 Essential (primary) hypertension: Secondary | ICD-10-CM | POA: Diagnosis not present

## 2017-05-10 DIAGNOSIS — E119 Type 2 diabetes mellitus without complications: Secondary | ICD-10-CM

## 2017-05-10 DIAGNOSIS — I2510 Atherosclerotic heart disease of native coronary artery without angina pectoris: Secondary | ICD-10-CM

## 2017-05-10 DIAGNOSIS — H6122 Impacted cerumen, left ear: Secondary | ICD-10-CM | POA: Diagnosis not present

## 2017-05-10 DIAGNOSIS — I2583 Coronary atherosclerosis due to lipid rich plaque: Secondary | ICD-10-CM | POA: Diagnosis not present

## 2017-05-10 DIAGNOSIS — G4730 Sleep apnea, unspecified: Secondary | ICD-10-CM

## 2017-05-10 LAB — BAYER DCA HB A1C WAIVED: HB A1C (BAYER DCA - WAIVED): 6.5 % (ref <7.0)

## 2017-05-10 MED ORDER — HYDROCHLOROTHIAZIDE 25 MG PO TABS: 25.0000 mg | ORAL_TABLET | Freq: Every day | ORAL | 1 refills | Status: DC

## 2017-05-10 NOTE — Assessment & Plan Note (Signed)
Using CPAP doing well

## 2017-05-10 NOTE — Progress Notes (Signed)
BP 111/66   Pulse 75   Wt 259 lb (117.5 kg)   SpO2 99%   BMI 37.16 kg/m    Subjective:    Patient ID: Mason Chute., male    DOB: 03-13-1941, 76 y.o.   MRN: [id number]  HPI: Mason Wilson. is a 76 y.o. male  Recheck medications diabetes hypertension. Patient doing well with diabetes no complaints or issues taking medicines faithfully has been struggling with weight loss and diet but diabetes doing well. Patient especially bothered by left ear and not able to wear his hearing aid because it stopped up and just doesn't do right. Other medication doing well without problems no chest pain or issues has new CPAP mask and machine and doing well  Relevant past medical, surgical, family and social history reviewed and updated as indicated. Interim medical history since our last visit reviewed. Allergies and medications reviewed and updated.  Review of Systems  Constitutional: Negative.   Respiratory: Negative.   Cardiovascular: Negative.     Per HPI unless specifically indicated above     Objective:    BP 111/66   Pulse 75   Wt 259 lb (117.5 kg)   SpO2 99%   BMI 37.16 kg/m   Wt Readings from Last 3 Encounters:  05/10/17 259 lb (117.5 kg)  04/29/17 255 lb 11.2 oz (116 kg)  02/08/17 255 lb (115.7 kg)    Physical Exam  Constitutional: He is oriented to person, place, and time. He appears well-developed and well-nourished.  HENT:  Head: Normocephalic and atraumatic.  Right Ear: External ear normal.  Left Ear: External ear normal.  Patient's left ear blocked with wax which is obstructing his hearing aid and was unable to wear his hearing aid ear removed with water and instruments revealing normal canal and TM. Patient education given on using vinegar alcohol solution drops for ear drying  Eyes: Conjunctivae and EOM are normal.  Neck: Normal range of motion.  Cardiovascular: Normal rate, regular rhythm and normal heart sounds.   Pulmonary/Chest: Effort normal and  breath sounds normal.  Musculoskeletal: Normal range of motion.  Neurological: He is alert and oriented to person, place, and time.  Skin: No erythema.  Psychiatric: He has a normal mood and affect. His behavior is normal. Judgment and thought content normal.    Results for orders placed or performed in visit on 17/61/60  Basic metabolic panel  Result Value Ref Range   Glucose 82 65 - 99 mg/dL   BUN 22 8 - 27 mg/dL   Creatinine, Ser 1.03 0.76 - 1.27 mg/dL   GFR calc non Af Amer 71 >59 mL/min/1.73   GFR calc Af Amer 82 >59 mL/min/1.73   BUN/Creatinine Ratio 21 10 - 24   Sodium 140 134 - 144 mmol/L   Potassium 4.3 3.5 - 5.2 mmol/L   Chloride 102 96 - 106 mmol/L   CO2 21 18 - 29 mmol/L   Calcium 9.3 8.6 - 10.2 mg/dL  LP+ALT+AST Piccolo, Waived  Result Value Ref Range   ALT (SGPT) Piccolo, Waived 53 (H) 10 - 47 U/L   AST (SGOT) Piccolo, Waived 46 (H) 11 - 38 U/L   Cholesterol Piccolo, Waived 111 <200 mg/dL   HDL Chol Piccolo, Waived 38 (L) >59 mg/dL   Triglycerides Piccolo,Waived 105 <150 mg/dL   Chol/HDL Ratio Piccolo,Waive 3.0 mg/dL   LDL Chol Calc Piccolo Waived 53 <100 mg/dL   VLDL Chol Calc Piccolo,Waive 21 <30 mg/dL  Hemoglobin A1c  Result Value Ref Range   Hgb A1c MFr Bld 6.7 (H) 4.8 - 5.6 %   Est. average glucose Bld gHb Est-mCnc 146 mg/dL      Assessment & Plan:   Problem List Items Addressed This Visit      Cardiovascular and Mediastinum   CAD (coronary artery disease)    The current medical regimen is effective;  continue present plan and medications.       Relevant Medications   hydrochlorothiazide (HYDRODIURIL) 25 MG tablet   Essential hypertension    The current medical regimen is effective;  continue present plan and medications.       Relevant Medications   hydrochlorothiazide (HYDRODIURIL) 25 MG tablet     Respiratory   Sleep apnea    Using CPAP doing well        Endocrine   Diabetes mellitus without complication ([place]) - Primary    Much  improved      Relevant Orders   Bayer DCA Hb A1c Waived     Other   Hyperlipidemia   Relevant Medications   hydrochlorothiazide (HYDRODIURIL) 25 MG tablet   Other Relevant Orders   Bayer DCA Hb A1c Waived    Other Visit Diagnoses    Impacted cerumen of left ear           Follow up plan: Return in about 3 months (around 08/10/2017) for Physical Exam, Hemoglobin A1c.

## 2017-05-10 NOTE — Assessment & Plan Note (Signed)
The current medical regimen is effective;  continue present plan and medications.  

## 2017-05-10 NOTE — Assessment & Plan Note (Signed)
Much improved

## 2017-06-07 DIAGNOSIS — E113393 Type 2 diabetes mellitus with moderate nonproliferative diabetic retinopathy without macular edema, bilateral: Secondary | ICD-10-CM | POA: Diagnosis not present

## 2017-06-07 LAB — HM DIABETES EYE EXAM

## 2017-06-10 ENCOUNTER — Other Ambulatory Visit: Payer: Self-pay | Admitting: Urology

## 2017-06-10 DIAGNOSIS — N401 Enlarged prostate with lower urinary tract symptoms: Secondary | ICD-10-CM

## 2017-07-15 ENCOUNTER — Other Ambulatory Visit: Payer: Self-pay

## 2017-07-26 ENCOUNTER — Other Ambulatory Visit: Payer: Medicare Other

## 2017-07-26 ENCOUNTER — Other Ambulatory Visit: Payer: Self-pay

## 2017-07-26 DIAGNOSIS — N401 Enlarged prostate with lower urinary tract symptoms: Secondary | ICD-10-CM | POA: Diagnosis not present

## 2017-07-27 ENCOUNTER — Ambulatory Visit: Payer: Medicare Other | Admitting: Gastroenterology

## 2017-07-27 LAB — PSA: Prostate Specific Ag, Serum: 1.3 ng/mL (ref 0.0–4.0)

## 2017-08-01 NOTE — Progress Notes (Signed)
08/02/2017 10:11 AM   Mason Wilson. May 15, 1941 [id number]  Referring provider: [name], MD [address] [place], [place] [address]  Chief Complaint  Patient presents with  . Benign Prostatic Hypertrophy    1year    HPI: Patient is a 76 year old Caucasian male with a history of UTI, BPH with LU TS, bilateral nephrolithiasis and ED who presents for a yearly follow up.    History of recurrent UTI's Patient was referred to [place] by, Volney American, PA-C, for recurrent UTI's.  Patient presented with his wife stated he had 2 urinary tract infections over the last year.  His urinary tract infection symptoms consist of dysuria, urgency and dribbling.  He is not having these symptoms at this time.  I have one documented UTI for E.Coli from 06/23/2016.  He has not had any UTI's since he was last seen by [place].  He denies gross hematuria, fevers, chills, nausea or vomiting at this time.     BPH WITH LUTS His IPSS score today is 5, which is mild lower urinary tract symptomatology. He is mostly satisfied with his quality life due to his urinary symptoms.  His previous I PSS score was 4/2.  His previous PVR was  74 mL.  His major complaint today a weak urinary stream.  He has had these symptoms for a few years.  He denies any dysuria, hematuria or suprapubic pain.  He currently taking tamsulosin 0.4 mg daily.  He has been noticing low BP since he has been on the tamsulosin.  He also denies any recent fevers, chills, nausea or vomiting.  He does not have a family history of PCa.     IPSS    Row Name 08/02/17 0900         International Prostate Symptom Score   How often have you had the sensation of not emptying your bladder? Not at All     How often have you had to urinate less than every two hours? Less than 1 in 5 times     How often have you found you stopped and started again several times when you urinated? Not at All     How often have you found it difficult to postpone  urination? About half the time     How often have you had a weak urinary stream? Less than 1 in 5 times     How often have you had to strain to start urination? Not at All     How many times did you typically get up at night to urinate? None     Total IPSS Score 5       Quality of Life due to urinary symptoms   If you were to spend the rest of your life with your urinary condition just the way it is now how would you feel about that? Mostly Satisfied        Score:  1-7 Mild 8-19 Moderate 20-35 Severe  UTI No recent symptoms of an UTI.  Bilateral nephrolithiasis A CT scan of the abdomen and pelvis with contrast in 2016 noted tiny 1-2 mm renal calculi bilaterally with no evidence of hydronephrosis.   No recent symptoms of gross hematuria or flank pain.  KUB taken today did not identify any stones.     Erectile dysfunction Patient has not had good effect with PDE-5 inhibitors.  He is wanting to know his other options.  Is now interested in Trimix.  PMH: Past Medical History:  Diagnosis Date  . Allergy   . Arthritis    "everywhere' - worse in back  . Body mass index 39.0-39.9, adult   . CAD (coronary artery disease)   . Chronic back pain   . Diabetes mellitus without complication ([place])   . Elevated liver enzymes   . GERD (gastroesophageal reflux disease)   . Heart murmur   . Hyperlipidemia   . Hypogonadism in male   . Melanoma ([place])    melanoma   . Melanoma in situ of right shoulder ([place])   . Renal stones   . Sleep apnea    CPAP  . Uses hearing aid    doesn't wear    Surgical History: Past Surgical History:  Procedure Laterality Date  . ANGIOPLASTY  1999   3 stents  . CARPAL TUNNEL RELEASE     x2  . CATARACT EXTRACTION, BILATERAL    . ESOPHAGOGASTRODUODENOSCOPY (EGD) WITH PROPOFOL N/A 09/08/2015   Procedure: ESOPHAGOGASTRODUODENOSCOPY (EGD) WITH PROPOFOL;  Surgeon: Lucilla Lame, MD;  Location: [facility];  Service: Endoscopy;  Laterality: N/A;   CPAP Diabetic - oral meds  . LIVER BIOPSY    . SKIN LESION EXCISION  1998  . SKIN LESION EXCISION     skin cancer removed on forehead in June 2018  . TONSILLECTOMY     age 87    Home Medications:  Allergies as of 08/02/2017      Reactions   Altace [ramipril] Cough      Medication List       Accurate as of 08/02/17 10:11 AM. Always use your most recent med list.          aspirin EC 81 MG tablet Take 81 mg by mouth daily.   clobetasol cream 0.05 % Commonly known as:  TEMOVATE   glipiZIDE 5 MG tablet Commonly known as:  GLUCOTROL Take 1 tablet (5 mg total) by mouth daily before breakfast.   hydrochlorothiazide 25 MG tablet Commonly known as:  HYDRODIURIL Take 1 tablet (25 mg total) by mouth daily.   losartan 100 MG tablet Commonly known as:  COZAAR Take 1 tablet (100 mg total) by mouth daily.   metFORMIN 500 MG tablet Commonly known as:  GLUCOPHAGE Take 2 tablets (1,000 mg total) by mouth 2 (two) times daily with a meal.   metoprolol succinate 50 MG 24 hr tablet Commonly known as:  TOPROL-XL Take 1 tablet (50 mg total) by mouth daily. Take with or immediately following a meal.   omeprazole 20 MG capsule Commonly known as:  PRILOSEC Take 20 mg by mouth daily. AM   simvastatin 40 MG tablet Commonly known as:  ZOCOR Take 1 tablet (40 mg total) by mouth daily at 6 PM.   tamsulosin 0.4 MG Caps capsule Commonly known as:  FLOMAX TAKE 1 CAPSULE(0.4 MG) BY MOUTH DAILY   tiZANidine 4 MG tablet Commonly known as:  ZANAFLEX Take 4 mg by mouth every 6 (six) hours as needed for muscle spasms.   traMADol 50 MG tablet Commonly known as:  ULTRAM Take by mouth every 6 (six) hours as needed.            Discharge Care Instructions        Start     Ordered   08/02/17 0000  tamsulosin (FLOMAX) 0.4 MG CAPS capsule    Question:  Supervising Provider  Answer:  Hollice Espy   08/02/17 1000      Allergies:  Allergies  Allergen Reactions  . Altace  [Ramipril] Cough    Family History: Family History  Problem Relation Age of Onset  . Thyroid disease Mother   . Alzheimer's disease Mother   . Heart disease Mother   . Cancer Father 4       testicular  . Heart disease Father   . Hypertension Father   . Heart attack Father   . Kidney disease Neg Hx   . Prostate cancer Neg Hx     Social History:  reports that he quit smoking about 47 years ago. His smoking use included Cigarettes. His smokeless tobacco use includes Chew. He reports that he does not drink alcohol or use drugs.  ROS: UROLOGY Frequent Urination?: No Hard to postpone urination?: No Burning/pain with urination?: No Get up at night to urinate?: No Leakage of urine?: No Urine stream starts and stops?: No Trouble starting stream?: No Do you have to strain to urinate?: No Blood in urine?: No Urinary tract infection?: No Sexually transmitted disease?: No Injury to kidneys or bladder?: No Painful intercourse?: No Weak stream?: No Erection problems?: Yes Penile pain?: No  Gastrointestinal Nausea?: No Vomiting?: No Indigestion/heartburn?: No Diarrhea?: No Constipation?: No  Constitutional Fever: No Night sweats?: No Weight loss?: No Fatigue?: No  Skin Skin rash/lesions?: No Itching?: No  Eyes Blurred vision?: No Double vision?: No  Ears/Nose/Throat Sore throat?: No Sinus problems?: No  Hematologic/Lymphatic Swollen glands?: No Easy bruising?: No  Cardiovascular Leg swelling?: No Chest pain?: No  Respiratory Cough?: No Shortness of breath?: No  Endocrine Excessive thirst?: No  Musculoskeletal Back pain?: Yes Joint pain?: No  Neurological Headaches?: No Dizziness?: No  Psychologic Depression?: No Anxiety?: No  Physical Exam: BP 113/67   Pulse 88   Ht 5\' 10"  (1.778 m)   Wt 250 lb (113.4 kg)   BMI 35.87 kg/m   Constitutional: Well nourished. Alert and oriented, No acute distress. HEENT: [place] AT, moist mucus membranes.  Trachea midline, no masses. Cardiovascular: No clubbing, cyanosis, or edema. Respiratory: Normal respiratory effort, no increased work of breathing. GI: Abdomen is soft, non tender, non distended, no abdominal masses. Liver and spleen not palpable.  No hernias appreciated.  Stool sample for occult testing is not indicated.   GU: No CVA tenderness.  No bladder fullness or masses.  Patient with uncircumcised phallus.  Foreskin easily retracted Urethral meatus is patent.  No penile discharge. No penile lesions or rashes. Scrotum without lesions, cysts, rashes and/or edema.  Testicles are located scrotally bilaterally. No masses are appreciated in the testicles. Left and right epididymis are normal. Rectal: Patient with  normal sphincter tone. Anus and perineum without scarring or rashes. No rectal masses are appreciated. Prostate is approximately 45 grams, no nodules are appreciated. Seminal vesicles are normal. Skin: No rashes, bruises or suspicious lesions. Lymph: No cervical or inguinal adenopathy. Neurologic: Grossly intact, no focal deficits, moving all 4 extremities. Psychiatric: Normal mood and affect.  Laboratory Data: PSA History  1.6 ng/mL in 07/2015  1.8 ng/mL in 07/2016  1.3 ng/mL in 07/2017 Lab Results  Component Value Date   WBC 2.8 (L) 08/09/2016   HGB 11.3 (L) 08/09/2016   HCT 33.0 (L) 08/09/2016   MCV 87 08/09/2016   PLT 91 (LL) 08/09/2016    Lab Results  Component Value Date   CREATININE 1.03 02/08/2017    Lab Results  Component Value Date   TSH 2.100 08/09/2016       Component Value Date/Time   CHOL 111 02/08/2017  1129   HDL 40 08/09/2016 1021   CHOLHDL 3.0 08/09/2016 1021   VLDL 21 02/08/2017 1129   LDLCALC 61 08/09/2016 1021    Lab Results  Component Value Date   AST 46 (H) 02/08/2017   Lab Results  Component Value Date   ALT 53 (H) 02/08/2017   I have reviewed the labs  Pertinent imaging CLINICAL DATA:  Nephrolithiasis, flank pain, diabetes  mellitus, hypertension, former smoker  EXAM: ABDOMEN - 1 VIEW  COMPARISON:  07/28/2015  FINDINGS: No definite urinary tract calcification.  Small LEFT pelvic phleboliths stable.  Vascular calcifications in pelvis noted.  Bowel gas pattern normal.  Minimally prominent spleen tip unchanged.  Bones demineralized.  IMPRESSION: No definite urinary tract calcifications identified.   Electronically Signed   By: Lavonia Dana M.D.   On: 08/02/2017 11:32  I have independently reviewed the films  Assessment & Plan:    1. History of UTI (lower urinary tract infection)  - resolved at this time  - uncertain if patient is actually emptying his bladder all the time as his urinary symptoms are mostly obstructive- continue alpha-blocker  - patient to present to [place] with signs of infection  2. BPH with LUTS  - IPSS score is 5/2, it is worsening  - Continue conservative management, avoiding bladder irritants and timed voiding's  - Change tamsulosin 0.4 mg daily; refills given   - RTC in one year for I PSS, PSA and exam   3. Bilateral nephrolithiasis  - small punctate stones seen on CT scan last year  - doubtful they are a nidus for infection  - KUB in one year  4. Erectile dysfunction  - discussed intracavernousal injections- patient would like to think about this options  - information given  - he will call if he would like to pursue this therapy   Return for schedule Trimix titration.  These notes generated with voice recognition software. I apologize for typographical errors.  Zara Council, [facility] [address], [place], [place] [address] [phone]

## 2017-08-02 ENCOUNTER — Ambulatory Visit
Admission: RE | Admit: 2017-08-02 | Discharge: 2017-08-02 | Disposition: A | Discharge status: Home / Self Care | Payer: Medicare Other | Source: Ambulatory Visit | Attending: Urology | Admitting: Urology

## 2017-08-02 ENCOUNTER — Telehealth: Payer: Self-pay | Admitting: Urology

## 2017-08-02 ENCOUNTER — Ambulatory Visit (INDEPENDENT_AMBULATORY_CARE_PROVIDER_SITE_OTHER): Payer: Medicare Other | Admitting: Urology

## 2017-08-02 ENCOUNTER — Encounter: Payer: Self-pay | Admitting: Urology

## 2017-08-02 VITALS — BP 113/67 | HR 88 | Ht 70.0 in | Wt 250.0 lb

## 2017-08-02 DIAGNOSIS — N401 Enlarged prostate with lower urinary tract symptoms: Secondary | ICD-10-CM | POA: Diagnosis not present

## 2017-08-02 DIAGNOSIS — N529 Male erectile dysfunction, unspecified: Secondary | ICD-10-CM | POA: Diagnosis not present

## 2017-08-02 DIAGNOSIS — N200 Calculus of kidney: Secondary | ICD-10-CM

## 2017-08-02 DIAGNOSIS — I2510 Atherosclerotic heart disease of native coronary artery without angina pectoris: Secondary | ICD-10-CM | POA: Diagnosis not present

## 2017-08-02 DIAGNOSIS — N138 Other obstructive and reflux uropathy: Secondary | ICD-10-CM

## 2017-08-02 DIAGNOSIS — Z87440 Personal history of urinary (tract) infections: Secondary | ICD-10-CM | POA: Diagnosis not present

## 2017-08-02 DIAGNOSIS — I2583 Coronary atherosclerosis due to lipid rich plaque: Secondary | ICD-10-CM | POA: Diagnosis not present

## 2017-08-02 DIAGNOSIS — R109 Unspecified abdominal pain: Secondary | ICD-10-CM | POA: Diagnosis not present

## 2017-08-02 MED ORDER — TAMSULOSIN HCL 0.4 MG PO CAPS: ORAL_CAPSULE | ORAL | 3 refills | Status: DC

## 2017-08-02 NOTE — Telephone Encounter (Signed)
Would you call in the Trimix titration vial for [name] to [facility]?

## 2017-08-02 NOTE — Telephone Encounter (Signed)
Spoke Ed at [facility] , test dose 22ml vial called in

## 2017-08-03 ENCOUNTER — Other Ambulatory Visit: Payer: Self-pay

## 2017-08-03 ENCOUNTER — Ambulatory Visit (INDEPENDENT_AMBULATORY_CARE_PROVIDER_SITE_OTHER): Payer: Medicare Other | Admitting: Gastroenterology

## 2017-08-03 ENCOUNTER — Encounter: Payer: Self-pay | Admitting: Gastroenterology

## 2017-08-03 VITALS — BP 126/63 | HR 80 | Temp 98.6°F | Ht 70.0 in | Wt 258.0 lb

## 2017-08-03 DIAGNOSIS — I2510 Atherosclerotic heart disease of native coronary artery without angina pectoris: Secondary | ICD-10-CM

## 2017-08-03 DIAGNOSIS — K7460 Unspecified cirrhosis of liver: Secondary | ICD-10-CM | POA: Diagnosis not present

## 2017-08-03 DIAGNOSIS — I2583 Coronary atherosclerosis due to lipid rich plaque: Secondary | ICD-10-CM | POA: Diagnosis not present

## 2017-08-03 NOTE — Patient Instructions (Addendum)
You are scheduled for a RUQ abdominal US at [facility] on Thursday, Oct 11th @ 8:30am. Please arrive 8:15am and check in at the [facility]. You cannot have anything to eat or drink after midnight on Wednesday night.  If you need to reschedule this appointment for any reason, please contact central scheduling at [phone].

## 2017-08-03 NOTE — Progress Notes (Signed)
Primary Care Physician: [name], MD  Primary Gastroenterologist:  Dr. Lucilla Lame  Chief Complaint  Patient presents with  . Follow up fatty liver    HPI: Mason Wilson. is a 76 y.o. male here For follow-up of cirrhosis. The patient has had a history of fatty liver disease and a liver biopsy that showed cirrhosis. The patient has gained probably 7 pounds since his last visit with me. The patient last saw me 10 months ago. He denies any abdominal pain nausea vomiting fevers chills. He does report that he had a tick borne disease approximate year ago.  Current Outpatient Prescriptions  Medication Sig Dispense Refill  . aspirin EC 81 MG tablet Take 81 mg by mouth daily.    . clobetasol cream (TEMOVATE) 0.05 %   1  . glipiZIDE (GLUCOTROL) 5 MG tablet Take 1 tablet (5 mg total) by mouth daily before breakfast. 90 tablet 4  . hydrochlorothiazide (HYDRODIURIL) 25 MG tablet Take 1 tablet (25 mg total) by mouth daily. 90 tablet 1  . losartan (COZAAR) 100 MG tablet Take 1 tablet (100 mg total) by mouth daily. 90 tablet 4  . metFORMIN (GLUCOPHAGE) 500 MG tablet Take 2 tablets (1,000 mg total) by mouth 2 (two) times daily with a meal. 360 tablet 4  . metoprolol succinate (TOPROL-XL) 50 MG 24 hr tablet Take 1 tablet (50 mg total) by mouth daily. Take with or immediately following a meal. 90 tablet 3  . omeprazole (PRILOSEC) 20 MG capsule Take 20 mg by mouth daily. AM    . simvastatin (ZOCOR) 40 MG tablet Take 1 tablet (40 mg total) by mouth daily at 6 PM. 90 tablet 4  . tamsulosin (FLOMAX) 0.4 MG CAPS capsule TAKE 1 CAPSULE(0.4 MG) BY MOUTH DAILY 90 capsule 3  . tiZANidine (ZANAFLEX) 4 MG tablet Take 4 mg by mouth every 6 (six) hours as needed for muscle spasms.    . traMADol (ULTRAM) 50 MG tablet Take by mouth every 6 (six) hours as needed.     No current facility-administered medications for this visit.     Allergies as of 08/03/2017 - Review Complete 08/03/2017  Allergen  Reaction Noted  . Altace [ramipril] Cough 04/03/2015    ROS:  General: Negative for anorexia, weight loss, fever, chills, fatigue, weakness. ENT: Negative for hoarseness, difficulty swallowing , nasal congestion. CV: Negative for chest pain, angina, palpitations, dyspnea on exertion, peripheral edema.  Respiratory: Negative for dyspnea at rest, dyspnea on exertion, cough, sputum, wheezing.  GI: See history of present illness. GU:  Negative for dysuria, hematuria, urinary incontinence, urinary frequency, nocturnal urination.  Endo: Negative for unusual weight change.    Physical Examination:   BP 126/63   Pulse 80   Temp 98.6 F (37 C) (Oral)   Ht 5\' 10"  (1.778 m)   Wt 258 lb (117 kg)   BMI 37.02 kg/m   General: Well-nourished, well-developed in no acute distress.  Eyes: No icterus. Conjunctivae pink. Mouth: Oropharyngeal mucosa moist and pink , no lesions erythema or exudate. Lungs: Clear to auscultation bilaterally. Non-labored. Heart: Regular rate and rhythm, no murmurs rubs or gallops.  Abdomen: Bowel sounds are normal, nontender, nondistended, no hepatosplenomegaly or masses, no abdominal bruits or hernia , no rebound or guarding.   Extremities: No lower extremity edema. No clubbing or deformities. Neuro: Alert and oriented x 3.  Grossly intact. Skin: Warm and dry, no jaundice.   Psych: Alert and cooperative, normal mood and affect.  Labs:    Imaging Studies: Dg Abd 1 View  Result Date: 08/02/2017 CLINICAL DATA:  Nephrolithiasis, flank pain, diabetes mellitus, hypertension, former smoker EXAM: ABDOMEN - 1 VIEW COMPARISON:  07/28/2015 FINDINGS: No definite urinary tract calcification. Small LEFT pelvic phleboliths stable. Vascular calcifications in pelvis noted. Bowel gas pattern normal. Minimally prominent spleen tip unchanged. Bones demineralized. IMPRESSION: No definite urinary tract calcifications identified. Electronically Signed   By: Lavonia Dana M.D.   On:  08/02/2017 11:32    Assessment and Plan:   Mason Wilson. is a 76 y.o. y/o male with fatty liver and cirrhosis. The patient will continue to try to lose weight. The patient will have his blood sent off for alpha-fetoprotein. The patient will also have his liver checked with ultrasound. The patient has been explained the plan and agrees with it.    Lucilla Lame, MD. Marval Regal   Note: This dictation was prepared with Dragon dictation along with smaller phrase technology. Any transcriptional errors that result from this process are unintentional.

## 2017-08-04 LAB — AFP TUMOR MARKER: AFP, Serum, Tumor Marker: 1.7 ng/mL (ref 0.0–8.3)

## 2017-08-05 ENCOUNTER — Telehealth: Payer: Self-pay

## 2017-08-05 NOTE — Telephone Encounter (Signed)
-----   Message from Lucilla Lame, MD sent at 08/04/2017  7:43 AM EDT ----- Let the patient know that the AFP was normal.

## 2017-08-05 NOTE — Telephone Encounter (Signed)
Pt notified AFP was normal.

## 2017-08-08 NOTE — Progress Notes (Signed)
08/10/2017 10:46 AM   Mason Wilson. 28-Aug-1941 [id number]  Referring provider: [name], MD [address] [place], [place] [address]  Chief Complaint  Patient presents with  . Erectile Dysfunction    Trimix Teaching    HPI: Patient is a 76 year old Caucasian male with a history of UTI, BPH with LU TS, bilateral nephrolithiasis and ED who presents for a Trimix titration and teaching.    Erectile dysfunction His SHIM score is 5, which is severe.  He has been having difficulty with erections for several years.   His major complaint is no erections. His libido is Preserved.   His risk factors for ED are age, BPH, DM, HTN, HLD, sleep apnea, CAD and CKD.   He denies any painful erections or curvatures with his erections.   He is still having/no longer having spontaneous erections.  He has tried PDE-5 inhibitors in the past.       SHIM    Row Name 08/10/17 0841         SHIM: Over the last 6 months:   How do you rate your confidence that you could get and keep an erection? Very Low     When you had erections with sexual stimulation, how often were your erections hard enough for penetration (entering your partner)? Almost Never or Never     During sexual intercourse, how often were you able to maintain your erection after you had penetrated (entered) your partner? Almost Never or Never     During sexual intercourse, how difficult was it to maintain your erection to completion of intercourse? Extremely Difficult     When you attempted sexual intercourse, how often was it satisfactory for you? Almost Never or Never       SHIM Total Score   SHIM 5        Score: 1-7 Severe ED 8-11 Moderate ED 12-16 Mild-Moderate ED 17-21 Mild ED 22-25 No ED   PMH: Past Medical History:  Diagnosis Date  . Allergy   . Arthritis    "everywhere' - worse in back  . Body mass index 39.0-39.9, adult   . CAD (coronary artery disease)   . Chronic back pain   . Diabetes mellitus  without complication ([place])   . Elevated liver enzymes   . GERD (gastroesophageal reflux disease)   . Heart murmur   . Hyperlipidemia   . Hypogonadism in male   . Melanoma ([place])    melanoma   . Melanoma in situ of right shoulder ([facility])   . Renal stones   . Sleep apnea    CPAP  . Uses hearing aid    doesn't wear    Surgical History: Past Surgical History:  Procedure Laterality Date  . ANGIOPLASTY  1999   3 stents  . CARPAL TUNNEL RELEASE     x2  . CATARACT EXTRACTION, BILATERAL    . ESOPHAGOGASTRODUODENOSCOPY (EGD) WITH PROPOFOL N/A 09/08/2015   Procedure: ESOPHAGOGASTRODUODENOSCOPY (EGD) WITH PROPOFOL;  Surgeon: Lucilla Lame, MD;  Location: [facility];  Service: Endoscopy;  Laterality: N/A;  CPAP Diabetic - oral meds  . LIVER BIOPSY    . SKIN LESION EXCISION  1998  . SKIN LESION EXCISION     skin cancer removed on forehead in June 2018  . TONSILLECTOMY     age 73    Home Medications:  Allergies as of 08/10/2017      Reactions   Altace [ramipril] Cough  Medication List       Accurate as of 08/10/17 10:46 AM. Always use your most recent med list.          aspirin EC 81 MG tablet Take 81 mg by mouth daily.   clobetasol cream 0.05 % Commonly known as:  TEMOVATE   glipiZIDE 5 MG tablet Commonly known as:  GLUCOTROL Take 1 tablet (5 mg total) by mouth daily before breakfast.   hydrochlorothiazide 25 MG tablet Commonly known as:  HYDRODIURIL Take 1 tablet (25 mg total) by mouth daily.   losartan 100 MG tablet Commonly known as:  COZAAR Take 1 tablet (100 mg total) by mouth daily.   metFORMIN 500 MG tablet Commonly known as:  GLUCOPHAGE Take 2 tablets (1,000 mg total) by mouth 2 (two) times daily with a meal.   metoprolol succinate 50 MG 24 hr tablet Commonly known as:  TOPROL-XL Take 1 tablet (50 mg total) by mouth daily. Take with or immediately following a meal.   omeprazole 20 MG capsule Commonly known as:  PRILOSEC Take 20 mg by mouth  daily. AM   simvastatin 40 MG tablet Commonly known as:  ZOCOR Take 1 tablet (40 mg total) by mouth daily at 6 PM.   tamsulosin 0.4 MG Caps capsule Commonly known as:  FLOMAX TAKE 1 CAPSULE(0.4 MG) BY MOUTH DAILY   tiZANidine 4 MG tablet Commonly known as:  ZANAFLEX Take 4 mg by mouth every 6 (six) hours as needed for muscle spasms.   traMADol 50 MG tablet Commonly known as:  ULTRAM Take by mouth every 6 (six) hours as needed.       Allergies:  Allergies  Allergen Reactions  . Altace [Ramipril] Cough    Family History: Family History  Problem Relation Age of Onset  . Thyroid disease Mother   . Alzheimer's disease Mother   . Heart disease Mother   . Cancer Father 23       testicular  . Heart disease Father   . Hypertension Father   . Heart attack Father   . Kidney disease Neg Hx   . Prostate cancer Neg Hx   . Kidney cancer Neg Hx   . Bladder Cancer Neg Hx     Social History:  reports that he quit smoking about 47 years ago. His smoking use included Cigarettes. His smokeless tobacco use includes Chew. He reports that he does not drink alcohol or use drugs.  ROS: UROLOGY Frequent Urination?: No Hard to postpone urination?: No Burning/pain with urination?: No Get up at night to urinate?: No Leakage of urine?: No Urine stream starts and stops?: No Trouble starting stream?: No Do you have to strain to urinate?: No Blood in urine?: No Urinary tract infection?: No Sexually transmitted disease?: No Injury to kidneys or bladder?: No Painful intercourse?: No Weak stream?: No Erection problems?: Yes Penile pain?: No  Gastrointestinal Nausea?: No Vomiting?: No Indigestion/heartburn?: No Diarrhea?: No Constipation?: No  Constitutional Fever: No Night sweats?: No Weight loss?: No Fatigue?: No  Skin Skin rash/lesions?: No Itching?: No  Eyes Blurred vision?: No Double vision?: No  Ears/Nose/Throat Sore throat?: No Sinus problems?:  No  Hematologic/Lymphatic Swollen glands?: No Easy bruising?: No  Cardiovascular Leg swelling?: No Chest pain?: No  Respiratory Cough?: No Shortness of breath?: No  Endocrine Excessive thirst?: No  Musculoskeletal Back pain?: No Joint pain?: No  Neurological Headaches?: No Dizziness?: No  Psychologic Depression?: No Anxiety?: No  Physical Exam: BP 121/66   Pulse 89  Ht 5\' 10"  (1.778 m)   Wt 255 lb 6.4 oz (115.8 kg)   BMI 36.65 kg/m   Constitutional: Well nourished. Alert and oriented, No acute distress. HEENT: [place] AT, moist mucus membranes. Trachea midline, no masses. Cardiovascular: No clubbing, cyanosis, or edema. Respiratory: Normal respiratory effort, no increased work of breathing. GI: Abdomen is soft, non tender, non distended, no abdominal masses. Liver and spleen not palpable.  No hernias appreciated.  Stool sample for occult testing is not indicated.   GU: No CVA tenderness.  No bladder fullness or masses.  Patient with uncircumcised phallus.  Foreskin easily retracted Urethral meatus is patent.  No penile discharge. No penile lesions or rashes. Scrotum without lesions, cysts, rashes and/or edema.  Testicles are located scrotally bilaterally. No masses are appreciated in the testicles. Left and right epididymis are normal. Rectal: Deferred.   Skin: No rashes, bruises or suspicious lesions. Lymph: No cervical or inguinal adenopathy. Neurologic: Grossly intact, no focal deficits, moving all 4 extremities. Psychiatric: Normal mood and affect.  Laboratory Data: PSA History  1.6 ng/mL in 07/2015  1.8 ng/mL in 07/2016  1.3 ng/mL in 07/2017  Lab Results  Component Value Date   CREATININE 1.03 02/08/2017       Component Value Date/Time   CHOL 111 02/08/2017 1129   HDL 40 08/09/2016 1021   CHOLHDL 3.0 08/09/2016 1021   VLDL 21 02/08/2017 1129   LDLCALC 61 08/09/2016 1021    Lab Results  Component Value Date   AST 46 (H) 02/08/2017   Lab  Results  Component Value Date   ALT 53 (H) 02/08/2017   I have reviewed the labs  Procedure Patient's left corpus cavernosum is identified.  An area near the base of the penis is cleansed with rubbing alcohol.  Careful to avoid the dorsal vein, his wife injected 2 mcg of Trimix (papaverine 30 mg, phentolamine 1 mg and prostaglandin E1 10 mcg, Lot # [id number]  exp # 08/11/2017) is injected at a 90 degree angle into the left corpus cavernosum near the base of the penis at 0900.  Patient experienced a fullness in the phallus.  His wife then injected 2 mcg in the right corpus cavernosum near the base of the penis at 0925.  He still just had fullness in the phallus.  Then I injected 2 mcg in the left corpus cavernosum near the base of the penis at 0955.  He then experienced a semi firm erection.  Then I injected 2 mcg in the right corpus cavernosum near the base of the penis at 1020.  He then experienced a firm erection.    The patient's wife washed her hands.  She then was instructed to tap the syringe to allow air bubbles to float to the top of the syringe. She then depressed the plunger to allow the air to escape.  She then selected an injection site at the right base of his penis between 9 and 11:00 positions between the base and midportion of the penis. She was careful to avoid the 6 and 12:00 positions and any surface veins or arteries.  She then secured the head of the penis towards the left with light tension.  She then cleansed the area using an alcohol swab. She then took the syringe and injected at a 90 angle into the left corpus cavernosum 2 g of Trimix.  She then applied compression to the injection site.  He tolerated the procedure well.  He stated he had good comfort level with  the process of the injections.    Assessment & Plan:    1. Erectile dysfunction  - SHIM score is 5  - 8 mcg of Trimix injected today with successful tumescence  - Advised patient of the condition of priapism,  painful erection lasting for more than four hours, and to contact the office immediately or seek treatment in the ED   Return in about 1 year (around 08/10/2018) for KUB, PSA, I PSS, SHIM and exam.  These notes generated with voice recognition software. I apologize for typographical errors.  Zara Council, [facility] [address] [place], [place] [address] [phone]

## 2017-08-10 ENCOUNTER — Telehealth: Payer: Self-pay | Admitting: Urology

## 2017-08-10 ENCOUNTER — Encounter: Payer: Self-pay | Admitting: Urology

## 2017-08-10 ENCOUNTER — Ambulatory Visit (INDEPENDENT_AMBULATORY_CARE_PROVIDER_SITE_OTHER): Payer: Medicare Other | Admitting: Urology

## 2017-08-10 VITALS — BP 121/66 | HR 89 | Ht 70.0 in | Wt 255.4 lb

## 2017-08-10 DIAGNOSIS — I2510 Atherosclerotic heart disease of native coronary artery without angina pectoris: Secondary | ICD-10-CM | POA: Diagnosis not present

## 2017-08-10 DIAGNOSIS — I2583 Coronary atherosclerosis due to lipid rich plaque: Secondary | ICD-10-CM | POA: Diagnosis not present

## 2017-08-10 DIAGNOSIS — N529 Male erectile dysfunction, unspecified: Secondary | ICD-10-CM

## 2017-08-10 NOTE — Telephone Encounter (Signed)
Would you call in 10 mcg Trimix syringes to [facility] for Mason Wilson?  They would like to pick up the prescription next Wednesday, the 10th.  Thanks.

## 2017-08-10 NOTE — Telephone Encounter (Signed)
Pt's wife called and left a message that everything was fine and they would like Rx filled.  They saw Larene Beach this morning for trimix titration. If any questions, call pt [phone] (H) or [phone] (C)

## 2017-08-11 NOTE — Telephone Encounter (Signed)
Medication was called into [facility].  

## 2017-08-18 ENCOUNTER — Ambulatory Visit
Admission: RE | Admit: 2017-08-18 | Discharge: 2017-08-18 | Disposition: A | Discharge status: Home / Self Care | Payer: Medicare Other | Source: Ambulatory Visit | Attending: Gastroenterology | Admitting: Gastroenterology

## 2017-08-18 ENCOUNTER — Telehealth: Payer: Self-pay

## 2017-08-18 DIAGNOSIS — K769 Liver disease, unspecified: Secondary | ICD-10-CM | POA: Insufficient documentation

## 2017-08-18 DIAGNOSIS — K7460 Unspecified cirrhosis of liver: Secondary | ICD-10-CM | POA: Diagnosis not present

## 2017-08-18 DIAGNOSIS — K829 Disease of gallbladder, unspecified: Secondary | ICD-10-CM | POA: Diagnosis not present

## 2017-08-18 NOTE — Telephone Encounter (Signed)
Left vm on pt's wife's cell regarding lab [place] results.

## 2017-08-18 NOTE — Telephone Encounter (Signed)
-----   Message from Lucilla Lame, MD sent at 08/18/2017 10:51 AM EDT ----- That the patient know that his ultrasound did not show any masses or suspicious lesions. He should have a repeat ultrasound in 6 months.

## 2017-08-20 ENCOUNTER — Other Ambulatory Visit: Payer: Self-pay | Admitting: Family Medicine

## 2017-08-20 DIAGNOSIS — E119 Type 2 diabetes mellitus without complications: Secondary | ICD-10-CM

## 2017-08-22 ENCOUNTER — Other Ambulatory Visit: Payer: Self-pay | Admitting: Family Medicine

## 2017-08-22 DIAGNOSIS — I10 Essential (primary) hypertension: Secondary | ICD-10-CM

## 2017-08-22 NOTE — Telephone Encounter (Signed)
Last OV: 05/10/17 Next OV: 09/01/17   Lab Results  Component Value Date   HGBA1C 6.7 (H) 02/08/2017

## 2017-09-01 ENCOUNTER — Ambulatory Visit (INDEPENDENT_AMBULATORY_CARE_PROVIDER_SITE_OTHER): Payer: Medicare Other | Admitting: Family Medicine

## 2017-09-01 ENCOUNTER — Encounter: Payer: Self-pay | Admitting: Family Medicine

## 2017-09-01 VITALS — BP 114/65 | HR 77 | Temp 98.0°F | Ht 70.47 in | Wt 256.0 lb

## 2017-09-01 DIAGNOSIS — R945 Abnormal results of liver function studies: Secondary | ICD-10-CM

## 2017-09-01 DIAGNOSIS — Z23 Encounter for immunization: Secondary | ICD-10-CM | POA: Diagnosis not present

## 2017-09-01 DIAGNOSIS — R7989 Other specified abnormal findings of blood chemistry: Secondary | ICD-10-CM

## 2017-09-01 DIAGNOSIS — E7800 Pure hypercholesterolemia, unspecified: Secondary | ICD-10-CM

## 2017-09-01 DIAGNOSIS — E785 Hyperlipidemia, unspecified: Secondary | ICD-10-CM | POA: Diagnosis not present

## 2017-09-01 DIAGNOSIS — K7469 Other cirrhosis of liver: Secondary | ICD-10-CM | POA: Diagnosis not present

## 2017-09-01 DIAGNOSIS — N400 Enlarged prostate without lower urinary tract symptoms: Secondary | ICD-10-CM

## 2017-09-01 DIAGNOSIS — M545 Low back pain, unspecified: Secondary | ICD-10-CM

## 2017-09-01 DIAGNOSIS — I2510 Atherosclerotic heart disease of native coronary artery without angina pectoris: Secondary | ICD-10-CM

## 2017-09-01 DIAGNOSIS — E119 Type 2 diabetes mellitus without complications: Secondary | ICD-10-CM | POA: Diagnosis not present

## 2017-09-01 DIAGNOSIS — N183 Chronic kidney disease, stage 3 unspecified: Secondary | ICD-10-CM

## 2017-09-01 DIAGNOSIS — I10 Essential (primary) hypertension: Secondary | ICD-10-CM | POA: Diagnosis not present

## 2017-09-01 DIAGNOSIS — K7460 Unspecified cirrhosis of liver: Secondary | ICD-10-CM

## 2017-09-01 DIAGNOSIS — G4730 Sleep apnea, unspecified: Secondary | ICD-10-CM | POA: Diagnosis not present

## 2017-09-01 DIAGNOSIS — G8929 Other chronic pain: Secondary | ICD-10-CM

## 2017-09-01 DIAGNOSIS — Z1329 Encounter for screening for other suspected endocrine disorder: Secondary | ICD-10-CM

## 2017-09-01 DIAGNOSIS — I8510 Secondary esophageal varices without bleeding: Secondary | ICD-10-CM

## 2017-09-01 DIAGNOSIS — Z0000 Encounter for general adult medical examination without abnormal findings: Secondary | ICD-10-CM

## 2017-09-01 DIAGNOSIS — Z7189 Other specified counseling: Secondary | ICD-10-CM

## 2017-09-01 DIAGNOSIS — I2583 Coronary atherosclerosis due to lipid rich plaque: Secondary | ICD-10-CM

## 2017-09-01 LAB — URINALYSIS, ROUTINE W REFLEX MICROSCOPIC
Bilirubin, UA: NEGATIVE
GLUCOSE, UA: NEGATIVE
Ketones, UA: NEGATIVE
LEUKOCYTES UA: NEGATIVE
NITRITE UA: NEGATIVE
Protein, UA: NEGATIVE
RBC, UA: NEGATIVE
Specific Gravity, UA: 1.02 (ref 1.005–1.030)
Urobilinogen, Ur: 0.2 mg/dL (ref 0.2–1.0)
pH, UA: 5.5 (ref 5.0–7.5)

## 2017-09-01 LAB — MICROSCOPIC EXAMINATION: BACTERIA UA: NONE SEEN

## 2017-09-01 LAB — BAYER DCA HB A1C WAIVED: HB A1C (BAYER DCA - WAIVED): 6.6 % (ref <7.0)

## 2017-09-01 MED ORDER — LOSARTAN POTASSIUM 100 MG PO TABS: 100.0000 mg | ORAL_TABLET | Freq: Every day | ORAL | 4 refills | Status: DC

## 2017-09-01 MED ORDER — GLIPIZIDE 5 MG PO TABS: 5.0000 mg | ORAL_TABLET | Freq: Every day | ORAL | 4 refills | Status: DC

## 2017-09-01 MED ORDER — METFORMIN HCL 500 MG PO TABS: 1000.0000 mg | ORAL_TABLET | Freq: Two times a day (BID) | ORAL | 4 refills | Status: DC

## 2017-09-01 MED ORDER — METOPROLOL SUCCINATE ER 50 MG PO TB24: 50.0000 mg | ORAL_TABLET | Freq: Every day | ORAL | 4 refills | Status: DC

## 2017-09-01 MED ORDER — TRAMADOL HCL 50 MG PO TABS
50.0000 mg | ORAL_TABLET | Freq: Four times a day (QID) | ORAL | 0 refills | Status: DC | PRN
Start: 2017-09-01 — End: 2018-09-05

## 2017-09-01 MED ORDER — SIMVASTATIN 40 MG PO TABS: 40.0000 mg | ORAL_TABLET | Freq: Every day | ORAL | 4 refills | Status: DC

## 2017-09-01 MED ORDER — HYDROCHLOROTHIAZIDE 25 MG PO TABS: 25.0000 mg | ORAL_TABLET | Freq: Every day | ORAL | 4 refills | Status: DC

## 2017-09-01 NOTE — Assessment & Plan Note (Signed)
A voluntary discussion about advance care planning including the explanation and discussion of advance directives was extensively discussed  with the patient.  Explanation about the health care proxy and Living will was reviewed and packet with forms with explanation of how to fill them out was given.    

## 2017-09-01 NOTE — Assessment & Plan Note (Signed)
The current medical regimen is effective;  continue present plan and medications.  

## 2017-09-01 NOTE — Assessment & Plan Note (Signed)
Using CPAP and doing well.  

## 2017-09-01 NOTE — Progress Notes (Signed)
BP 114/65   Pulse 77   Temp 98 F (36.7 C) (Oral)   Ht 5' 10.47" (1.79 m)   Wt 256 lb (116.1 kg)   SpO2 98%   BMI 36.24 kg/m    Subjective:    Patient ID: Mason Wilson., male    DOB: 1941-02-14, 76 y.o.   MRN: [id number]  HPI: [name] is a 76 y.o. male  Chief Complaint  Patient presents with  . Annual Exam  . Nasal Congestion    x 1 week   Patient here accompanied by his wife who assists with history all in all doing well having over the last week some mild head cold or allergy type symptoms hasn't really felt sick is able to breathe well no fever or chills. Review of medical illnesses all in all doing well BPH stable followed by urology  sleep apnea stable has new machine Diabetes Good control occasional low blood sugar spells which are predictable with either poor eating or her heavy work. No cardiac symptoms chest pain chest tightness. Followed by Dr. wall for cirrhosis and esophageal varices which are stable.  Relevant past medical, surgical, family and social history reviewed and updated as indicated. Interim medical history since our last visit reviewed. Allergies and medications reviewed and updated.  Review of Systems  Constitutional: Negative.   HENT: Negative.   Eyes: Negative.   Respiratory: Negative.   Cardiovascular: Negative.   Gastrointestinal: Negative.   Endocrine: Negative.   Genitourinary: Negative.   Musculoskeletal: Negative.   Skin: Negative.   Allergic/Immunologic: Negative.   Neurological: Negative.   Hematological: Negative.   Psychiatric/Behavioral: Negative.     Per HPI unless specifically indicated above     Objective:    BP 114/65   Pulse 77   Temp 98 F (36.7 C) (Oral)   Ht 5' 10.47" (1.79 m)   Wt 256 lb (116.1 kg)   SpO2 98%   BMI 36.24 kg/m   Wt Readings from Last 3 Encounters:  09/01/17 256 lb (116.1 kg)  08/10/17 255 lb 6.4 oz (115.8 kg)  08/03/17 258 lb (117 kg)    Physical Exam    Constitutional: He is oriented to person, place, and time. He appears well-developed and well-nourished.  HENT:  Head: Normocephalic.  Right Ear: External ear normal.  Left Ear: External ear normal.  Nose: Nose normal.  Eyes: Pupils are equal, round, and reactive to light. Conjunctivae and EOM are normal.  Neck: Normal range of motion. Neck supple. No thyromegaly present.  Cardiovascular: Normal rate, regular rhythm, normal heart sounds and intact distal pulses.   Pulmonary/Chest: Effort normal and breath sounds normal.  Abdominal: Soft. Bowel sounds are normal. There is no splenomegaly or hepatomegaly.  Genitourinary:  Genitourinary Comments: Done at urology  Musculoskeletal: Normal range of motion.  Lymphadenopathy:    He has no cervical adenopathy.  Neurological: He is alert and oriented to person, place, and time. He has normal reflexes.  Skin: Skin is warm and dry.  Psychiatric: He has a normal mood and affect. His behavior is normal. Judgment and thought content normal.    Results for orders placed or performed in visit on 08/03/17  AFP tumor marker  Result Value Ref Range   AFP, Serum, Tumor Marker 1.7 0.0 - 8.3 ng/mL      Assessment & Plan:   Problem List Items Addressed This Visit      Cardiovascular and Mediastinum   CAD (coronary artery disease)  The current medical regimen is effective;  continue present plan and medications.       Relevant Medications   hydrochlorothiazide (HYDRODIURIL) 25 MG tablet   losartan (COZAAR) 100 MG tablet   metoprolol succinate (TOPROL-XL) 50 MG 24 hr tablet   simvastatin (ZOCOR) 40 MG tablet   Esophageal varices in cirrhosis ([facility])    The current medical regimen is effective;  continue present plan and medications.       Relevant Medications   hydrochlorothiazide (HYDRODIURIL) 25 MG tablet   losartan (COZAAR) 100 MG tablet   metoprolol succinate (TOPROL-XL) 50 MG 24 hr tablet   simvastatin (ZOCOR) 40 MG tablet    Essential hypertension - Primary    The current medical regimen is effective;  continue present plan and medications.       Relevant Medications   hydrochlorothiazide (HYDRODIURIL) 25 MG tablet   losartan (COZAAR) 100 MG tablet   metoprolol succinate (TOPROL-XL) 50 MG 24 hr tablet   simvastatin (ZOCOR) 40 MG tablet   Other Relevant Orders   CBC with Differential/Platelet   Comprehensive metabolic panel     Respiratory   Sleep apnea    Using CPAP and doing well        Digestive   Hepatic cirrhosis ([place])    By Dr. Houseman Norris and stable      Relevant Orders   Comprehensive metabolic panel     Endocrine   Diabetes mellitus without complication [facility])    The current medical regimen is effective;  continue present plan and medications.       Relevant Medications   glipiZIDE (GLUCOTROL) 5 MG tablet   losartan (COZAAR) 100 MG tablet   metFORMIN (GLUCOPHAGE) 500 MG tablet   simvastatin (ZOCOR) 40 MG tablet   Other Relevant Orders   Comprehensive metabolic panel   Urinalysis, Routine w reflex microscopic   Bayer DCA Hb A1c Waived     Genitourinary   Chronic kidney disease   Relevant Orders   Comprehensive metabolic panel   BPH (benign prostatic hyperplasia)    Followed by urology and stable      Relevant Orders   PSA     Other   Hyperlipidemia   Relevant Medications   hydrochlorothiazide (HYDRODIURIL) 25 MG tablet   losartan (COZAAR) 100 MG tablet   metoprolol succinate (TOPROL-XL) 50 MG 24 hr tablet   simvastatin (ZOCOR) 40 MG tablet   Other Relevant Orders   Lipid panel   Low back pain    Discuss low back pain patient's original prescription that he still has half a bottle from November 2016 taking rare tramadol on a when necessary basis will give fresh prescription.      Relevant Medications   traMADol (ULTRAM) 50 MG tablet   LFT elevation   Relevant Orders   Comprehensive metabolic panel   Advanced care planning/counseling discussion    A voluntary  discussion about advance care planning including the explanation and discussion of advance directives was extensively discussed  with the patient.  Explanation about the health care proxy and Living will was reviewed and packet with forms with explanation of how to fill them out was given.        Other Visit Diagnoses    Thyroid disorder screen       Relevant Orders   TSH   Needs flu shot       Relevant Orders   Flu vaccine HIGH DOSE PF (Fluzone High dose) (Completed)   PE (physical exam), annual  Follow up plan: Return in about 6 months (around 03/02/2018) for Hemoglobin A1c, BMP,  Lipids, ALT, AST.

## 2017-09-01 NOTE — Assessment & Plan Note (Signed)
Discuss low back pain patient's original prescription that he still has half a bottle from November 2016 taking rare tramadol on a when necessary basis will give fresh prescription.

## 2017-09-01 NOTE — Assessment & Plan Note (Signed)
Followed by urology and stable 

## 2017-09-01 NOTE — Assessment & Plan Note (Signed)
By Dr. Mall Norris and stable

## 2017-09-02 ENCOUNTER — Encounter: Payer: Self-pay | Admitting: Family Medicine

## 2017-09-02 LAB — CBC WITH DIFFERENTIAL/PLATELET
BASOS ABS: 0 10*3/uL (ref 0.0–0.2)
Basos: 1 %
EOS (ABSOLUTE): 0.2 10*3/uL (ref 0.0–0.4)
Eos: 7 %
Hematocrit: 30.3 % — ABNORMAL LOW (ref 37.5–51.0)
Hemoglobin: 9.4 g/dL — ABNORMAL LOW (ref 13.0–17.7)
IMMATURE GRANS (ABS): 0 10*3/uL (ref 0.0–0.1)
Immature Granulocytes: 1 %
LYMPHS ABS: 0.5 10*3/uL — AB (ref 0.7–3.1)
LYMPHS: 26 %
MCH: 25.2 pg — AB (ref 26.6–33.0)
MCHC: 31 g/dL — ABNORMAL LOW (ref 31.5–35.7)
MCV: 81 fL (ref 79–97)
MONOCYTES: 13 %
Monocytes Absolute: 0.3 10*3/uL (ref 0.1–0.9)
NEUTROS PCT: 52 %
Neutrophils Absolute: 1.1 10*3/uL — ABNORMAL LOW (ref 1.4–7.0)
PLATELETS: 90 10*3/uL — AB (ref 150–379)
RBC: 3.73 x10E6/uL — AB (ref 4.14–5.80)
RDW: 16.3 % — ABNORMAL HIGH (ref 12.3–15.4)
WBC: 2.1 10*3/uL — AB (ref 3.4–10.8)

## 2017-09-02 LAB — COMPREHENSIVE METABOLIC PANEL
ALT: 41 IU/L (ref 0–44)
AST: 37 IU/L (ref 0–40)
Albumin/Globulin Ratio: 1.5 (ref 1.2–2.2)
Albumin: 4.1 g/dL (ref 3.5–4.8)
Alkaline Phosphatase: 72 IU/L (ref 39–117)
BILIRUBIN TOTAL: 0.5 mg/dL (ref 0.0–1.2)
BUN/Creatinine Ratio: 20 (ref 10–24)
BUN: 21 mg/dL (ref 8–27)
CHLORIDE: 107 mmol/L — AB (ref 96–106)
CO2: 23 mmol/L (ref 20–29)
Calcium: 9.3 mg/dL (ref 8.6–10.2)
Creatinine, Ser: 1.06 mg/dL (ref 0.76–1.27)
GFR calc non Af Amer: 68 mL/min/{1.73_m2} (ref >59)
GFR, EST AFRICAN AMERICAN: 79 mL/min/{1.73_m2} (ref >59)
GLUCOSE: 74 mg/dL (ref 65–99)
Globulin, Total: 2.7 g/dL (ref 1.5–4.5)
Potassium: 4.5 mmol/L (ref 3.5–5.2)
Sodium: 144 mmol/L (ref 134–144)
TOTAL PROTEIN: 6.8 g/dL (ref 6.0–8.5)

## 2017-09-02 LAB — LIPID PANEL
CHOLESTEROL TOTAL: 106 mg/dL (ref 100–199)
Chol/HDL Ratio: 2.9 ratio (ref 0.0–5.0)
HDL: 36 mg/dL — AB (ref >39)
LDL Calculated: 54 mg/dL (ref 0–99)
TRIGLYCERIDES: 78 mg/dL (ref 0–149)
VLDL CHOLESTEROL CAL: 16 mg/dL (ref 5–40)

## 2017-09-02 LAB — TSH: TSH: 1.86 u[IU]/mL (ref 0.450–4.500)

## 2017-09-02 LAB — PSA: PROSTATE SPECIFIC AG, SERUM: 1.5 ng/mL (ref 0.0–4.0)

## 2017-09-22 ENCOUNTER — Ambulatory Visit: Payer: Medicare Other | Admitting: Internal Medicine

## 2017-09-22 ENCOUNTER — Other Ambulatory Visit: Payer: Medicare Other

## 2017-09-26 ENCOUNTER — Inpatient Hospital Stay: Payer: Medicare Other

## 2017-09-26 ENCOUNTER — Inpatient Hospital Stay: Payer: Medicare Other | Attending: Internal Medicine | Admitting: Internal Medicine

## 2017-09-26 VITALS — BP 112/68 | HR 76 | Temp 98.4°F | Resp 16 | Wt 253.8 lb

## 2017-09-26 DIAGNOSIS — K7460 Unspecified cirrhosis of liver: Secondary | ICD-10-CM | POA: Insufficient documentation

## 2017-09-26 DIAGNOSIS — R161 Splenomegaly, not elsewhere classified: Secondary | ICD-10-CM | POA: Insufficient documentation

## 2017-09-26 DIAGNOSIS — D61818 Other pancytopenia: Secondary | ICD-10-CM

## 2017-09-26 DIAGNOSIS — K766 Portal hypertension: Secondary | ICD-10-CM | POA: Diagnosis not present

## 2017-09-26 DIAGNOSIS — K7469 Other cirrhosis of liver: Secondary | ICD-10-CM

## 2017-09-26 DIAGNOSIS — Z79899 Other long term (current) drug therapy: Secondary | ICD-10-CM | POA: Diagnosis not present

## 2017-09-26 DIAGNOSIS — Z7982 Long term (current) use of aspirin: Secondary | ICD-10-CM | POA: Diagnosis not present

## 2017-09-26 DIAGNOSIS — Z7984 Long term (current) use of oral hypoglycemic drugs: Secondary | ICD-10-CM | POA: Insufficient documentation

## 2017-09-26 LAB — CBC WITH DIFFERENTIAL/PLATELET
BASOS ABS: 0 10*3/uL (ref 0–0.1)
BASOS PCT: 1 %
EOS ABS: 0.1 10*3/uL (ref 0–0.7)
EOS PCT: 5 %
HCT: 30.5 % — ABNORMAL LOW (ref 40.0–52.0)
HEMOGLOBIN: 9.8 g/dL — AB (ref 13.0–18.0)
Lymphocytes Relative: 31 %
Lymphs Abs: 0.8 10*3/uL — ABNORMAL LOW (ref 1.0–3.6)
MCH: 25.7 pg — ABNORMAL LOW (ref 26.0–34.0)
MCHC: 32.1 g/dL (ref 32.0–36.0)
MCV: 79.9 fL — ABNORMAL LOW (ref 80.0–100.0)
Monocytes Absolute: 0.3 10*3/uL (ref 0.2–1.0)
Monocytes Relative: 12 %
NEUTROS PCT: 51 %
Neutro Abs: 1.3 10*3/uL — ABNORMAL LOW (ref 1.4–6.5)
PLATELETS: 85 10*3/uL — AB (ref 150–440)
RBC: 3.81 MIL/uL — AB (ref 4.40–5.90)
RDW: 15.9 % — ABNORMAL HIGH (ref 11.5–14.5)
WBC: 2.5 10*3/uL — AB (ref 3.8–10.6)

## 2017-09-26 LAB — COMPREHENSIVE METABOLIC PANEL
ALT: 47 U/L (ref 17–63)
AST: 45 U/L — ABNORMAL HIGH (ref 15–41)
Albumin: 4 g/dL (ref 3.5–5.0)
Alkaline Phosphatase: 78 U/L (ref 38–126)
Anion gap: 6 (ref 5–15)
BILIRUBIN TOTAL: 0.9 mg/dL (ref 0.3–1.2)
BUN: 25 mg/dL — AB (ref 6–20)
CALCIUM: 9.2 mg/dL (ref 8.9–10.3)
CHLORIDE: 108 mmol/L (ref 101–111)
CO2: 27 mmol/L (ref 22–32)
CREATININE: 1.1 mg/dL (ref 0.61–1.24)
Glucose, Bld: 91 mg/dL (ref 65–99)
Potassium: 3.9 mmol/L (ref 3.5–5.1)
Sodium: 141 mmol/L (ref 135–145)
TOTAL PROTEIN: 7.2 g/dL (ref 6.5–8.1)

## 2017-09-26 NOTE — Assessment & Plan Note (Addendum)
#   Pancytopenia- Asymptomatic. Today labs white count 2.9 hemoglobin 9.8; platelets of 89.  Secondary to cirrhosis/portal hypertension/splenomegaly. Patient continues to be asymptomatic at this time.  Continue surveillance.  # Cirrhosis/portal hypertension- Question Karlene Lineman s/p Bx. follows up with Dr. Lynk Norris.  # Patient follow-up in  12 months with labs/AFP.  Will call results of AFP.  Will order ultrasound at next visit.  CC: Dr.Crissman.

## 2017-09-26 NOTE — Progress Notes (Signed)
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   In the interim patient denies any bleeding or nosebleeds.  Denies any worsening swelling in the legs.  Denies any unusual abdominal pain or abdominal swelling.   REVIEW OF SYSTEMS:  A complete 10 point review of system is done which is negative except mentioned above in history of present illness  I have reviewed the past medical history, past surgical history, social history and family history with the patient and they are unchanged from previous note unless stated above.  ALLERGIES:  is allergic to altace [ramipril].  MEDICATIONS:  Current Outpatient Medications  Medication Sig Dispense Refill  . aspirin EC 81 MG tablet Take 81 mg by mouth daily.    . clobetasol cream (TEMOVATE) 0.05 %   1  . glipiZIDE (GLUCOTROL) 5 MG tablet Take 1 tablet (5 mg total) by mouth daily before breakfast. 90 tablet 4  . hydrochlorothiazide (HYDRODIURIL) 25 MG tablet Take 1 tablet (25 mg total) by mouth daily. 90 tablet 4  . losartan (COZAAR) 100 MG tablet Take 1 tablet (100 mg total) by mouth daily. 90 tablet 4  . metFORMIN (GLUCOPHAGE) 500 MG tablet Take 2 tablets (1,000 mg total) by mouth 2 (two) times daily with a meal. 360 tablet 4  . metoprolol succinate (TOPROL-XL) 50 MG 24 hr tablet Take 1 tablet (50 mg total) by mouth daily. Take with or immediately following a meal. 90 tablet 4  . omeprazole (PRILOSEC) 20 MG capsule Take 20 mg by mouth daily. AM    .  simvastatin (ZOCOR) 40 MG tablet Take 1 tablet (40 mg total) by mouth daily at 6 PM. 90 tablet 4  . tamsulosin (FLOMAX) 0.4 MG CAPS capsule TAKE 1 CAPSULE(0.4 MG) BY MOUTH DAILY 90 capsule 3  . tiZANidine (ZANAFLEX) 4 MG tablet Take 4 mg by mouth every 6 (six) hours as needed for muscle spasms.    . traMADol (ULTRAM) 50 MG tablet Take 1 tablet (50 mg total) by mouth every 6 (six) hours as needed. 120 tablet 0   No current facility-administered medications for this visit.     PHYSICAL EXAMINATION:   BP 112/68 (BP Location: Left Arm, Patient Position: Sitting)   Pulse 76   Temp 98.4 F (36.9 C) (Tympanic)   Resp 16   Wt 253 lb 12.8 oz (115.1 kg)   BMI 35.93 kg/m   Filed Weights   09/26/17 1123  Weight: 253 lb 12.8 oz (115.1 kg)    GENERAL: Well-nourished well-developed; Alert, no distress and comfortable.   Accompanied by his wife. EYES: no pallor or icterus OROPHARYNX: no thrush or ulceration; good dentition  NECK: supple, no masses felt LYMPH:  no palpable lymphadenopathy in the cervical, axillary or inguinal regions LUNGS: clear to auscultation and  No wheeze or crackles HEART/CVS: regular rate & rhythm and no murmurs; No lower extremity edema ABDOMEN:abdomen soft, non-tender and normal bowel sounds Musculoskeletal:no cyanosis of digits and no clubbing; positive for splenomegaly  PSYCH: alert & oriented x 3 with fluent speech  NEURO: no focal motor/sensory deficits SKIN:  no rashes or significant lesions   LABORATORY DATA:  I have reviewed the data as listed    Component Value Date/Time   NA 141 09/26/2017 1051   NA 144 09/01/2017 1014   K 3.9 09/26/2017 1051   CL 108 09/26/2017 1051   CO2 27 09/26/2017 1051   GLUCOSE 91 09/26/2017 1051   BUN 25 (H) 09/26/2017 1051   BUN 21 09/01/2017 1014   CREATININE 1.10 09/26/2017 1051   CALCIUM 9.2 09/26/2017 1051   PROT 7.2 09/26/2017 1051   PROT 6.8 09/01/2017 1014   ALBUMIN 4.0 09/26/2017 1051   ALBUMIN 4.1 09/01/2017  1014   AST 45 (H) 09/26/2017 1051   AST 46 (H) 02/08/2017 1129   ALT 47 09/26/2017 1051   ALT 53 (H) 02/08/2017 1129   ALKPHOS 78 09/26/2017 1051   BILITOT 0.9 09/26/2017 1051   BILITOT 0.5 09/01/2017 1014   GFRNONAA >60 09/26/2017 1051   GFRAA >60 09/26/2017 1051    No results found for: SPEP, UPEP  Lab Results  Component Value Date   WBC 2.5 (L) 09/26/2017   NEUTROABS 1.3 (L) 09/26/2017   HGB 9.8 (L) 09/26/2017   HCT 30.5 (L) 09/26/2017   MCV 79.9 (L) 09/26/2017   PLT 85 (L) 09/26/2017      Chemistry      Component Value Date/Time   NA 141 09/26/2017 1051   NA 144 09/01/2017 1014   K 3.9 09/26/2017 1051   CL 108 09/26/2017 1051   CO2 27 09/26/2017 1051   BUN 25 (H) 09/26/2017 1051   BUN 21 09/01/2017 1014   CREATININE 1.10 09/26/2017 1051      Component Value Date/Time   CALCIUM 9.2 09/26/2017 1051   ALKPHOS 78 09/26/2017 1051   AST 45 (H) 09/26/2017 1051   AST 46 (H) 02/08/2017 1129   ALT 47 09/26/2017 1051   ALT 53 (H) 02/08/2017 1129   BILITOT 0.9 09/26/2017 1051   BILITOT 0.5 09/01/2017 1014       ASSESSMENT & PLAN:   Other pancytopenia ([place]) # Pancytopenia- Asymptomatic. Today labs white count 2.9 hemoglobin 9.8; platelets of 89.  Secondary to cirrhosis/portal hypertension/splenomegaly. Patient continues to be asymptomatic at this time.  Continue surveillance.  # Cirrhosis/portal hypertension- Question Karlene Lineman s/p Bx. follows up with Dr. Banke Norris.  # Patient follow-up in  12 months with labs/AFP.  Will call results of AFP.  Will order ultrasound at next visit.  CC: Dr.Crissman.       Cammie Sickle, MD 09/26/2017 1:27 PM

## 2017-09-27 LAB — AFP TUMOR MARKER: AFP, SERUM, TUMOR MARKER: 1.5 ng/mL (ref 0.0–8.3)

## 2017-10-20 ENCOUNTER — Other Ambulatory Visit: Payer: Self-pay | Admitting: Family Medicine

## 2017-10-20 DIAGNOSIS — I2510 Atherosclerotic heart disease of native coronary artery without angina pectoris: Secondary | ICD-10-CM

## 2017-10-20 DIAGNOSIS — E7800 Pure hypercholesterolemia, unspecified: Secondary | ICD-10-CM

## 2017-10-20 DIAGNOSIS — E119 Type 2 diabetes mellitus without complications: Secondary | ICD-10-CM

## 2017-10-20 DIAGNOSIS — I2583 Coronary atherosclerosis due to lipid rich plaque: Secondary | ICD-10-CM

## 2017-10-21 ENCOUNTER — Ambulatory Visit (INDEPENDENT_AMBULATORY_CARE_PROVIDER_SITE_OTHER): Payer: Medicare Other

## 2017-10-21 VITALS — BP 150/70 | HR 114 | Temp 98.1°F | Ht 70.0 in | Wt 250.0 lb

## 2017-10-21 DIAGNOSIS — R300 Dysuria: Secondary | ICD-10-CM | POA: Diagnosis not present

## 2017-10-21 LAB — URINALYSIS, COMPLETE
Bilirubin, UA: NEGATIVE
GLUCOSE, UA: NEGATIVE
Nitrite, UA: NEGATIVE
Specific Gravity, UA: 1.025 (ref 1.005–1.030)
UUROB: 0.2 mg/dL (ref 0.2–1.0)
pH, UA: 5.5 (ref 5.0–7.5)

## 2017-10-21 LAB — MICROSCOPIC EXAMINATION: Epithelial Cells (non renal): NONE SEEN /hpf (ref 0–10)

## 2017-10-21 MED ORDER — NITROFURANTOIN MONOHYD MACRO 100 MG PO CAPS: 100.0000 mg | ORAL_CAPSULE | Freq: Two times a day (BID) | ORAL | 0 refills | Status: DC

## 2017-10-21 NOTE — Progress Notes (Addendum)
Pt presents today with c/o urinary frequency, dysuria, back pain, low grade fever, and chills.  A clean catch was obtained for u/a and cx.  Blood pressure (!) 150/70, pulse (!) 114, temperature 98.1 F (36.7 C), height 5\' 10"  (1.778 m), weight 250 lb (113.4 kg).  Per Dr. Pilar Jarvis pt can have macrobid 100mg  x7days.

## 2017-10-21 NOTE — Addendum Note (Signed)
Addended by: Toniann Fail C on: 10/21/2017 01:59 PM   Modules accepted: Orders

## 2017-10-24 ENCOUNTER — Telehealth: Payer: Self-pay

## 2017-10-24 ENCOUNTER — Telehealth: Payer: Self-pay | Admitting: Urology

## 2017-10-24 LAB — CULTURE, URINE COMPREHENSIVE

## 2017-10-24 NOTE — Telephone Encounter (Signed)
Pt came in Friday for U/A and culture.  We gave him samples of Nitrofurantoin mono and it isn't helping.  Please give pt call to see if we can do something different.  I told them culture results weren't back yet.

## 2017-10-24 NOTE — Telephone Encounter (Signed)
-----   Message from Nori Riis, PA-C sent at 10/24/2017  8:35 AM EST ----- Please let Mr. Belsky know that his urine culture was positive.  The bacteria is sensitive to the Macrobid he was prescribed.

## 2017-10-24 NOTE — Telephone Encounter (Signed)
Spoke with pt in reference to +ucx and abx. Pt voiced understanding.  

## 2017-10-26 NOTE — Telephone Encounter (Signed)
See previous note

## 2017-11-02 ENCOUNTER — Other Ambulatory Visit: Payer: Self-pay

## 2017-11-02 ENCOUNTER — Ambulatory Visit (INDEPENDENT_AMBULATORY_CARE_PROVIDER_SITE_OTHER): Payer: Medicare Other

## 2017-11-02 VITALS — BP 135/76 | HR 99 | Ht 70.0 in | Wt 246.0 lb

## 2017-11-02 DIAGNOSIS — N390 Urinary tract infection, site not specified: Secondary | ICD-10-CM

## 2017-11-02 LAB — URINALYSIS, COMPLETE
BILIRUBIN UA: NEGATIVE
GLUCOSE, UA: NEGATIVE
Nitrite, UA: POSITIVE — AB
SPEC GRAV UA: 1.025 (ref 1.005–1.030)
UUROB: 1 mg/dL (ref 0.2–1.0)
pH, UA: 5.5 (ref 5.0–7.5)

## 2017-11-02 LAB — MICROSCOPIC EXAMINATION
Epithelial Cells (non renal): NONE SEEN /hpf (ref 0–10)
RBC, UA: >30 /hpf — ABNORMAL HIGH (ref 0–?)

## 2017-11-02 MED ORDER — CEFUROXIME AXETIL 500 MG PO TABS: 500.0000 mg | ORAL_TABLET | Freq: Two times a day (BID) | ORAL | 0 refills | Status: DC

## 2017-11-02 NOTE — Progress Notes (Signed)
Patient's wife notified that urine looks positive for infection and culture was sent and abx was sent to pharm for patient to start.

## 2017-11-02 NOTE — Progress Notes (Signed)
Pt presents today with c/o urinary frequency and urgency, dysuria, hard to postpone urination, and fever.  A clean catch was obtained for u/a and cx. Pt was on macrobid for UTI and ucx was sensitive to abx.   Blood pressure 135/76, pulse 99, height 5\' 10"  (1.778 m), weight 246 lb (111.6 kg).

## 2017-11-04 LAB — CULTURE, URINE COMPREHENSIVE

## 2017-11-17 ENCOUNTER — Other Ambulatory Visit: Payer: Self-pay | Admitting: Family Medicine

## 2017-11-17 DIAGNOSIS — E119 Type 2 diabetes mellitus without complications: Secondary | ICD-10-CM

## 2017-11-22 ENCOUNTER — Encounter: Payer: Self-pay | Admitting: Family Medicine

## 2017-11-22 ENCOUNTER — Ambulatory Visit (INDEPENDENT_AMBULATORY_CARE_PROVIDER_SITE_OTHER): Payer: Medicare Other | Admitting: Family Medicine

## 2017-11-22 VITALS — BP 121/63 | HR 74 | Temp 98.6°F | Wt 245.0 lb

## 2017-11-22 DIAGNOSIS — J019 Acute sinusitis, unspecified: Secondary | ICD-10-CM | POA: Diagnosis not present

## 2017-11-22 DIAGNOSIS — I129 Hypertensive chronic kidney disease with stage 1 through stage 4 chronic kidney disease, or unspecified chronic kidney disease: Secondary | ICD-10-CM | POA: Diagnosis not present

## 2017-11-22 DIAGNOSIS — E119 Type 2 diabetes mellitus without complications: Secondary | ICD-10-CM

## 2017-11-22 MED ORDER — AMOXICILLIN-POT CLAVULANATE 875-125 MG PO TABS: 1.0000 | ORAL_TABLET | Freq: Two times a day (BID) | ORAL | 0 refills | Status: DC

## 2017-11-22 MED ORDER — FEXOFENADINE-PSEUDOEPHED ER 60-120 MG PO TB12: 1.0000 | ORAL_TABLET | Freq: Two times a day (BID) | ORAL | 1 refills | Status: DC

## 2017-11-22 NOTE — Progress Notes (Signed)
BP 121/63   Pulse 74   Temp 98.6 F (37 C) (Oral)   Wt 245 lb (111.1 kg)   SpO2 99%   BMI 35.15 kg/m    Subjective:    Patient ID: Mason Chute., male    DOB: 10/16/1941, 77 y.o.   MRN: [id number]  HPI: Mason Wilson is a 78 y.o. male  Chief Complaint  Patient presents with  . Nasal Congestion    x 1 month   Patient with marked congestion drainage feeling bad chills this worse patient without improvement rates with does have some head sloshing type sensations. Diabetes blood pressure is done okay during this time Relevant past medical, surgical, family and social history reviewed and updated as indicated. Interim medical history since our last visit reviewed. Allergies and medications reviewed and updated.  Review of Systems  Constitutional: Positive for chills, diaphoresis and fatigue. Negative for fever.  HENT: Positive for congestion, postnasal drip, rhinorrhea, sinus pressure, sinus pain, trouble swallowing and voice change.   Respiratory: Negative for cough, choking and chest tightness.   Cardiovascular: Negative.     Per HPI unless specifically indicated above     Objective:    BP 121/63   Pulse 74   Temp 98.6 F (37 C) (Oral)   Wt 245 lb (111.1 kg)   SpO2 99%   BMI 35.15 kg/m   Wt Readings from Last 3 Encounters:  11/22/17 245 lb (111.1 kg)  11/02/17 246 lb (111.6 kg)  10/21/17 250 lb (113.4 kg)    Physical Exam  Constitutional: He is oriented to person, place, and time. He appears well-developed and well-nourished.  HENT:  Head: Normocephalic and atraumatic.  Right Ear: External ear normal.  Left Ear: External ear normal.  Mouth/Throat: Oropharynx is clear and moist.  Eyes: Conjunctivae and EOM are normal.  Neck: Normal range of motion.  Cardiovascular: Normal rate, regular rhythm and normal heart sounds.  Pulmonary/Chest: Effort normal and breath sounds normal.  Musculoskeletal: Normal range of motion.  Neurological: He is alert and  oriented to person, place, and time.  Skin: No erythema.  Psychiatric: He has a normal mood and affect. His behavior is normal. Judgment and thought content normal.    Results for orders placed or performed in visit on 11/02/17  CULTURE, URINE COMPREHENSIVE  Result Value Ref Range   Urine Culture, Comprehensive Final report (A)    Organism ID, Bacteria Escherichia coli (A)    ANTIMICROBIAL SUSCEPTIBILITY Comment   Microscopic Examination  Result Value Ref Range   WBC, UA >30 (H) 0 - 5 /hpf   RBC, UA >30 (H) 0 - 2 /hpf   Epithelial Cells (non renal) None seen 0 - 10 /hpf   Mucus, UA Present (A) Not Estab.   Bacteria, UA Many (A) None seen/Few  Urinalysis, Complete  Result Value Ref Range   Specific Gravity, UA 1.025 1.005 - 1.030   pH, UA 5.5 5.0 - 7.5   Color, UA Yellow Yellow   Appearance Ur Cloudy (A) Clear   Leukocytes, UA 1+ (A) Negative   Protein, UA 2+ (A) Negative/Trace   Glucose, UA Negative Negative   Ketones, UA Trace (A) Negative   RBC, UA 3+ (A) Negative   Bilirubin, UA Negative Negative   Urobilinogen, Ur 1.0 0.2 - 1.0 mg/dL   Nitrite, UA Positive (A) Negative   Microscopic Examination See below:       Assessment & Plan:   Problem List Items Addressed This  Visit      Endocrine   Diabetes mellitus without complication ([place])    The current medical regimen is effective;  continue present plan and medications.         Genitourinary   Benign hypertensive renal disease    The current medical regimen is effective;  continue present plan and medications.        Other Visit Diagnoses    Acute sinusitis, recurrence not specified, unspecified location    -  Primary   Relevant Medications   amoxicillin-clavulanate (AUGMENTIN) 875-125 MG tablet   fexofenadine-pseudoephedrine (ALLEGRA-D) 60-120 MG 12 hr tablet    Discussed sinusitis care and treatment use of Augmentin discussed allerga d and meds Follow up plan: Return if symptoms worsen or fail to improve,  for As scheduled.

## 2017-11-22 NOTE — Assessment & Plan Note (Signed)
The current medical regimen is effective;  continue present plan and medications.  

## 2017-12-06 DIAGNOSIS — E113393 Type 2 diabetes mellitus with moderate nonproliferative diabetic retinopathy without macular edema, bilateral: Secondary | ICD-10-CM | POA: Diagnosis not present

## 2017-12-08 ENCOUNTER — Encounter: Payer: Self-pay | Admitting: Family Medicine

## 2017-12-08 ENCOUNTER — Ambulatory Visit (INDEPENDENT_AMBULATORY_CARE_PROVIDER_SITE_OTHER): Payer: Medicare Other | Admitting: Family Medicine

## 2017-12-08 VITALS — BP 132/71 | HR 72 | Temp 98.5°F | Wt 244.0 lb

## 2017-12-08 DIAGNOSIS — J019 Acute sinusitis, unspecified: Secondary | ICD-10-CM | POA: Diagnosis not present

## 2017-12-08 MED ORDER — LEVOFLOXACIN 750 MG PO TABS: 750.0000 mg | ORAL_TABLET | Freq: Every day | ORAL | 0 refills | Status: DC

## 2017-12-08 NOTE — Progress Notes (Signed)
BP 132/71   Pulse 72   Temp 98.5 F (36.9 C) (Oral)   Wt 244 lb (110.7 kg)   SpO2 100%   BMI 35.01 kg/m    Subjective:    Patient ID: Mason Chute., male    DOB: 24-Oct-1941, 77 y.o.   MRN: [id number]  HPI: Mason Wilson is a 77 y.o. male  Chief Complaint  Patient presents with  . Follow-up    Still having congestion/drainage.   Patient with ongoing sinus drainage congestion.  Nasal passages seem to be clear but has D nasal speech occultly swallowing because of mucus in his throat does not feel that sinus infection from above ever really went away.  Diabetes has been stable without issues as has cholesterol.  Blood pressures been okay also.  Relevant past medical, surgical, family and social history reviewed and updated as indicated. Interim medical history since our last visit reviewed. Allergies and medications reviewed and updated.  Review of Systems  Constitutional: Negative.   Respiratory: Negative.   Cardiovascular: Negative.     Per HPI unless specifically indicated above     Objective:    BP 132/71   Pulse 72   Temp 98.5 F (36.9 C) (Oral)   Wt 244 lb (110.7 kg)   SpO2 100%   BMI 35.01 kg/m   Wt Readings from Last 3 Encounters:  12/08/17 244 lb (110.7 kg)  11/22/17 245 lb (111.1 kg)  11/02/17 246 lb (111.6 kg)    Physical Exam  Constitutional: He is oriented to person, place, and time. He appears well-developed and well-nourished.  HENT:  Head: Normocephalic and atraumatic.  Right Ear: External ear normal.  Left Ear: External ear normal.  Mouth/Throat: Oropharyngeal exudate present.  Eyes: Conjunctivae and EOM are normal.  Neck: Normal range of motion.  Cardiovascular: Normal rate, regular rhythm and normal heart sounds.  Pulmonary/Chest: Effort normal and breath sounds normal.  Musculoskeletal: Normal range of motion.  Neurological: He is alert and oriented to person, place, and time.  Skin: No erythema.  Psychiatric: He has a normal  mood and affect. His behavior is normal. Judgment and thought content normal.    Results for orders placed or performed in visit on 11/02/17  CULTURE, URINE COMPREHENSIVE  Result Value Ref Range   Urine Culture, Comprehensive Final report (A)    Organism ID, Bacteria Escherichia coli (A)    ANTIMICROBIAL SUSCEPTIBILITY Comment   Microscopic Examination  Result Value Ref Range   WBC, UA >30 (H) 0 - 5 /hpf   RBC, UA >30 (H) 0 - 2 /hpf   Epithelial Cells (non renal) None seen 0 - 10 /hpf   Mucus, UA Present (A) Not Estab.   Bacteria, UA Many (A) None seen/Few  Urinalysis, Complete  Result Value Ref Range   Specific Gravity, UA 1.025 1.005 - 1.030   pH, UA 5.5 5.0 - 7.5   Color, UA Yellow Yellow   Appearance Ur Cloudy (A) Clear   Leukocytes, UA 1+ (A) Negative   Protein, UA 2+ (A) Negative/Trace   Glucose, UA Negative Negative   Ketones, UA Trace (A) Negative   RBC, UA 3+ (A) Negative   Bilirubin, UA Negative Negative   Urobilinogen, Ur 1.0 0.2 - 1.0 mg/dL   Nitrite, UA Positive (A) Negative   Microscopic Examination See below:       Assessment & Plan:   Problem List Items Addressed This Visit    None    Visit Diagnoses  Acute sinusitis, recurrence not specified, unspecified location    -  Primary   Relevant Medications   levofloxacin (LEVAQUIN) 750 MG tablet    Discussed ongoing resistant sinusitis will treat with levofloxacin 751 a day for 7 days.  Discussed complications of possible tendon rupture.  Discussed cautions with activity.  Also with D nasal speech and lack of congestion if not getting better will refer to ear nose and throat to further evaluate.  Discussed with diabetes and possible hypoglycemia with glipizide will hold glipizide while taking levofloxacin.  Follow up plan: Return for As scheduled.

## 2017-12-09 ENCOUNTER — Ambulatory Visit: Payer: Self-pay | Admitting: *Deleted

## 2017-12-09 DIAGNOSIS — K219 Gastro-esophageal reflux disease without esophagitis: Secondary | ICD-10-CM | POA: Diagnosis not present

## 2017-12-09 DIAGNOSIS — H6980 Other specified disorders of Eustachian tube, unspecified ear: Secondary | ICD-10-CM | POA: Diagnosis not present

## 2017-12-09 DIAGNOSIS — R1314 Dysphagia, pharyngoesophageal phase: Secondary | ICD-10-CM | POA: Diagnosis not present

## 2017-12-09 DIAGNOSIS — J329 Chronic sinusitis, unspecified: Secondary | ICD-10-CM

## 2017-12-09 DIAGNOSIS — R634 Abnormal weight loss: Secondary | ICD-10-CM

## 2017-12-09 DIAGNOSIS — H93299 Other abnormal auditory perceptions, unspecified ear: Secondary | ICD-10-CM | POA: Diagnosis not present

## 2017-12-09 DIAGNOSIS — R1310 Dysphagia, unspecified: Secondary | ICD-10-CM

## 2017-12-09 NOTE — Telephone Encounter (Signed)
Referral printed along with OV notes, Insurance, face sheet, faxed to [facility].

## 2017-12-09 NOTE — Telephone Encounter (Signed)
Continue antibiotics. Referral to ENT generated.

## 2017-12-09 NOTE — Telephone Encounter (Signed)
Wife made aware of referral and to continue antibiotic.

## 2017-12-09 NOTE — Addendum Note (Signed)
Addended by: Valerie Roys on: 12/09/2017 09:53 AM   Modules accepted: Orders

## 2017-12-09 NOTE — Telephone Encounter (Signed)
Wife called for [name] because he can hardly talk due to the congestion in the back of his throat.  He was in the background.  Saw Dr. Jeananne Rama yesterday.    When he eats he is  having trouble swallowing this morning.   He can swallow liquids but not solids.  He feels like he has "a mucus plug" in the back of this throat.   He is worse this morning than when we were there yesterday.  They are requesting a referral to the ENT doctor that they had talked about with Dr. Jeananne Rama yesterday.  I routed a high priority note to Dr. Rance Muir nurse pool making him aware of the situation.  Reason for Disposition . [1] Taking antibiotic > 48 hours (2 days) AND [2] fever persists  Answer Assessment - Initial Assessment Questions 1. ANTIBIOTIC: "What antibiotic are you receiving?" "How many times per day?"     Levofloxin 2. ONSET: "When was the antibiotic started?"     Last night 3. PAIN: "How bad is the sinus pain?"   (Scale 1-10; mild, moderate or severe)   - MILD (1-3): doesn't interfere with normal activities    - MODERATE (4-7): interferes with normal activities (e.g., work or school) or awakens from sleep   - SEVERE (8-10): excruciating pain and patient unable to do any normal activities        No pain or headaches 4. FEVER: "Do you have a fever?" If so, ask: "What is it, how was it measured, and when did it start?"      No fever 5. SYMPTOMS: "Are there any other symptoms you're concerned about?" If so, ask: "When did it start?"     Has a lot of mucus in the back of his throat.   Having difficulty swallowing this morning.   He has tried hot beverages which did not help.   Can swollow liquids ok but not solid food. 6. PREGNANCY: "Is there any chance you are pregnant?" "When was your last menstrual period?"     *No Answer*  Protocols used: SINUS INFECTION ON ANTIBIOTIC FOLLOW-UP CALL-A-AH

## 2017-12-22 ENCOUNTER — Other Ambulatory Visit: Payer: Self-pay | Admitting: Nurse Practitioner

## 2017-12-22 DIAGNOSIS — R1310 Dysphagia, unspecified: Secondary | ICD-10-CM | POA: Diagnosis not present

## 2017-12-22 DIAGNOSIS — G1229 Other motor neuron disease: Secondary | ICD-10-CM | POA: Diagnosis not present

## 2017-12-22 DIAGNOSIS — R4781 Slurred speech: Secondary | ICD-10-CM

## 2017-12-22 DIAGNOSIS — H02403 Unspecified ptosis of bilateral eyelids: Secondary | ICD-10-CM | POA: Diagnosis not present

## 2017-12-27 ENCOUNTER — Ambulatory Visit
Admission: RE | Admit: 2017-12-27 | Discharge: 2017-12-27 | Disposition: A | Discharge status: Home / Self Care | Payer: Medicare Other | Source: Ambulatory Visit | Attending: Nurse Practitioner | Admitting: Nurse Practitioner

## 2017-12-27 DIAGNOSIS — R4781 Slurred speech: Secondary | ICD-10-CM | POA: Insufficient documentation

## 2017-12-27 DIAGNOSIS — H02403 Unspecified ptosis of bilateral eyelids: Secondary | ICD-10-CM | POA: Diagnosis not present

## 2017-12-27 DIAGNOSIS — R1310 Dysphagia, unspecified: Secondary | ICD-10-CM | POA: Insufficient documentation

## 2017-12-27 LAB — POCT I-STAT CREATININE: Creatinine, Ser: 0.9 mg/dL (ref 0.61–1.24)

## 2017-12-27 MED ORDER — GADOBENATE DIMEGLUMINE 529 MG/ML IV SOLN
20.0000 mL | Freq: Once | INTRAVENOUS | Status: AC | PRN
  Administered 2017-12-27: 20 mL via INTRAVENOUS

## 2018-01-02 ENCOUNTER — Other Ambulatory Visit: Payer: Self-pay | Admitting: Nurse Practitioner

## 2018-01-02 DIAGNOSIS — G7000 Myasthenia gravis without (acute) exacerbation: Secondary | ICD-10-CM

## 2018-01-06 DIAGNOSIS — G7000 Myasthenia gravis without (acute) exacerbation: Secondary | ICD-10-CM

## 2018-01-06 HISTORY — DX: Myasthenia gravis without (acute) exacerbation: G70.00

## 2018-01-09 DIAGNOSIS — E559 Vitamin D deficiency, unspecified: Secondary | ICD-10-CM | POA: Diagnosis not present

## 2018-01-09 DIAGNOSIS — E538 Deficiency of other specified B group vitamins: Secondary | ICD-10-CM | POA: Diagnosis not present

## 2018-01-09 DIAGNOSIS — E119 Type 2 diabetes mellitus without complications: Secondary | ICD-10-CM | POA: Diagnosis not present

## 2018-01-09 DIAGNOSIS — G7000 Myasthenia gravis without (acute) exacerbation: Secondary | ICD-10-CM | POA: Diagnosis not present

## 2018-01-10 ENCOUNTER — Telehealth: Payer: Self-pay | Admitting: Gastroenterology

## 2018-01-10 ENCOUNTER — Ambulatory Visit
Admission: RE | Admit: 2018-01-10 | Discharge: 2018-01-10 | Disposition: A | Discharge status: Home / Self Care | Payer: Medicare Other | Source: Ambulatory Visit | Attending: Nurse Practitioner | Admitting: Nurse Practitioner

## 2018-01-10 DIAGNOSIS — E320 Persistent hyperplasia of thymus: Secondary | ICD-10-CM | POA: Diagnosis not present

## 2018-01-10 DIAGNOSIS — G7000 Myasthenia gravis without (acute) exacerbation: Secondary | ICD-10-CM | POA: Diagnosis not present

## 2018-01-10 DIAGNOSIS — K7460 Unspecified cirrhosis of liver: Secondary | ICD-10-CM | POA: Diagnosis not present

## 2018-01-10 DIAGNOSIS — R161 Splenomegaly, not elsewhere classified: Secondary | ICD-10-CM | POA: Diagnosis not present

## 2018-01-10 DIAGNOSIS — I700 Atherosclerosis of aorta: Secondary | ICD-10-CM | POA: Diagnosis not present

## 2018-01-10 NOTE — Telephone Encounter (Signed)
Pt wife left vm to cancel her husbands apt because he has been diagnosed with  " Mystinia Grava" and Dr. Brigitte Pulse states apt is [place]. If any questions pt wife states to call her

## 2018-01-12 DIAGNOSIS — G7000 Myasthenia gravis without (acute) exacerbation: Secondary | ICD-10-CM | POA: Diagnosis not present

## 2018-01-23 ENCOUNTER — Ambulatory Visit: Payer: Medicare Other | Admitting: Gastroenterology

## 2018-02-17 ENCOUNTER — Other Ambulatory Visit: Payer: Self-pay | Admitting: Family Medicine

## 2018-02-17 DIAGNOSIS — E119 Type 2 diabetes mellitus without complications: Secondary | ICD-10-CM

## 2018-02-22 DIAGNOSIS — L719 Rosacea, unspecified: Secondary | ICD-10-CM | POA: Diagnosis not present

## 2018-02-22 DIAGNOSIS — Z85828 Personal history of other malignant neoplasm of skin: Secondary | ICD-10-CM | POA: Diagnosis not present

## 2018-02-22 DIAGNOSIS — D692 Other nonthrombocytopenic purpura: Secondary | ICD-10-CM | POA: Diagnosis not present

## 2018-02-22 DIAGNOSIS — D229 Melanocytic nevi, unspecified: Secondary | ICD-10-CM | POA: Diagnosis not present

## 2018-02-22 DIAGNOSIS — L578 Other skin changes due to chronic exposure to nonionizing radiation: Secondary | ICD-10-CM | POA: Diagnosis not present

## 2018-02-22 DIAGNOSIS — Z85820 Personal history of malignant melanoma of skin: Secondary | ICD-10-CM | POA: Diagnosis not present

## 2018-02-22 DIAGNOSIS — L821 Other seborrheic keratosis: Secondary | ICD-10-CM | POA: Diagnosis not present

## 2018-02-22 DIAGNOSIS — Z1283 Encounter for screening for malignant neoplasm of skin: Secondary | ICD-10-CM | POA: Diagnosis not present

## 2018-02-22 DIAGNOSIS — D2230 Melanocytic nevi of unspecified part of face: Secondary | ICD-10-CM | POA: Diagnosis not present

## 2018-02-22 DIAGNOSIS — D1801 Hemangioma of skin and subcutaneous tissue: Secondary | ICD-10-CM | POA: Diagnosis not present

## 2018-02-22 DIAGNOSIS — D225 Melanocytic nevi of trunk: Secondary | ICD-10-CM | POA: Diagnosis not present

## 2018-02-22 DIAGNOSIS — L812 Freckles: Secondary | ICD-10-CM | POA: Diagnosis not present

## 2018-03-02 ENCOUNTER — Ambulatory Visit (INDEPENDENT_AMBULATORY_CARE_PROVIDER_SITE_OTHER): Payer: Medicare Other | Admitting: Family Medicine

## 2018-03-02 ENCOUNTER — Encounter: Payer: Self-pay | Admitting: Family Medicine

## 2018-03-02 VITALS — BP 113/64 | HR 66 | Ht 70.0 in | Wt 239.0 lb

## 2018-03-02 DIAGNOSIS — G7000 Myasthenia gravis without (acute) exacerbation: Secondary | ICD-10-CM | POA: Diagnosis not present

## 2018-03-02 DIAGNOSIS — I10 Essential (primary) hypertension: Secondary | ICD-10-CM | POA: Diagnosis not present

## 2018-03-02 DIAGNOSIS — E119 Type 2 diabetes mellitus without complications: Secondary | ICD-10-CM | POA: Diagnosis not present

## 2018-03-02 DIAGNOSIS — E785 Hyperlipidemia, unspecified: Secondary | ICD-10-CM | POA: Diagnosis not present

## 2018-03-02 DIAGNOSIS — I129 Hypertensive chronic kidney disease with stage 1 through stage 4 chronic kidney disease, or unspecified chronic kidney disease: Secondary | ICD-10-CM

## 2018-03-02 LAB — LP+ALT+AST PICCOLO, WAIVED
ALT (SGPT) Piccolo, Waived: 49 U/L — ABNORMAL HIGH (ref 10–47)
AST (SGOT) PICCOLO, WAIVED: 43 U/L — AB (ref 11–38)
CHOL/HDL RATIO PICCOLO,WAIVE: 2.9 mg/dL
CHOLESTEROL PICCOLO, WAIVED: 104 mg/dL (ref <200)
HDL Chol Piccolo, Waived: 36 mg/dL — ABNORMAL LOW (ref >59)
LDL Chol Calc Piccolo Waived: 50 mg/dL (ref <100)
TRIGLYCERIDES PICCOLO,WAIVED: 92 mg/dL (ref <150)
VLDL Chol Calc Piccolo,Waive: 18 mg/dL (ref <30)

## 2018-03-02 LAB — BAYER DCA HB A1C WAIVED: HB A1C (BAYER DCA - WAIVED): 5.9 % (ref <7.0)

## 2018-03-02 NOTE — Assessment & Plan Note (Signed)
Followed by [facility].

## 2018-03-02 NOTE — Assessment & Plan Note (Signed)
The current medical regimen is effective;  continue present plan and medications.  

## 2018-03-02 NOTE — Progress Notes (Signed)
BP 113/64   Pulse 66   Ht 5\' 10"  (1.778 m)   Wt 239 lb (108.4 kg)   SpO2 98%   BMI 34.29 kg/m    Subjective:    Patient ID: Mason Chute., male    DOB: Oct 08, 1941, 77 y.o.   MRN: [id number]  HPI: Mason Wilson is a 77 y.o. male  Chief Complaint  Patient presents with  . Follow-up  . Diabetes  . Hypertension  . Hyperlipidemia  Patient follow-up doing well with diabetes blood pressure cholesterol. Patient's been losing weight which is helped with the above.  No complaints from medications. Patient with new diagnosis of myasthenia gravis and still in the work-up stage is getting some effective treatment now but that is contributed to his weight loss. Other medical problems have remained stable.  Relevant past medical, surgical, family and social history reviewed and updated as indicated. Interim medical history since our last visit reviewed. Allergies and medications reviewed and updated.  Review of Systems  Constitutional: Negative.   Respiratory: Negative.   Cardiovascular: Negative.     Per HPI unless specifically indicated above     Objective:    BP 113/64   Pulse 66   Ht 5\' 10"  (1.778 m)   Wt 239 lb (108.4 kg)   SpO2 98%   BMI 34.29 kg/m   Wt Readings from Last 3 Encounters:  03/02/18 239 lb (108.4 kg)  12/08/17 244 lb (110.7 kg)  11/22/17 245 lb (111.1 kg)    Physical Exam  Constitutional: He is oriented to person, place, and time. He appears well-developed and well-nourished.  HENT:  Head: Normocephalic and atraumatic.  Eyes: Conjunctivae and EOM are normal.  Neck: Normal range of motion.  Cardiovascular: Normal rate, regular rhythm and normal heart sounds.  Pulmonary/Chest: Effort normal and breath sounds normal.  Musculoskeletal: Normal range of motion.  Neurological: He is alert and oriented to person, place, and time.  Skin: No erythema.  Psychiatric: He has a normal mood and affect. His behavior is normal. Judgment and thought  content normal.    Results for orders placed or performed during the hospital encounter of 12/27/17  I-STAT creatinine  Result Value Ref Range   Creatinine, Ser 0.90 0.61 - 1.24 mg/dL      Assessment & Plan:   Problem List Items Addressed This Visit      Cardiovascular and Mediastinum   Essential hypertension    The current medical regimen is effective;  continue present plan and medications.       Relevant Orders   Basic metabolic panel   Bayer DCA Hb A1c Waived   LP+ALT+AST [place], [place]     Endocrine   Diabetes mellitus without complication ([place]) - Primary    The current medical regimen is effective;  continue present plan and medications. Discussed with continued weight loss patient may need to stop glipizide       Relevant Orders   Basic metabolic panel   Bayer DCA Hb A1c Waived   LP+ALT+AST [place], Waived     Nervous and Auditory   Myasthenia gravis without acute exacerbation ([place])    Followed by [facility].      Relevant Medications   pyridostigmine (MESTINON) 60 MG tablet     Genitourinary   Benign hypertensive renal disease   Relevant Orders   Basic metabolic panel   Bayer DCA Hb A1c Waived   LP+ALT+AST [place], Waived     Other   Hyperlipidemia  The current medical regimen is effective;  continue present plan and medications.       Relevant Orders   Basic metabolic panel   Bayer DCA Hb A1c Waived   LP+ALT+AST [place], Waived       Follow up plan: Return in about 6 months (around 09/01/2018) for Physical Exam, Hemoglobin A1c.

## 2018-03-02 NOTE — Assessment & Plan Note (Addendum)
The current medical regimen is effective;  continue present plan and medications. Discussed with continued weight loss patient may need to stop glipizide

## 2018-03-03 LAB — BASIC METABOLIC PANEL
BUN/Creatinine Ratio: 19 (ref 10–24)
BUN: 21 mg/dL (ref 8–27)
CALCIUM: 9.4 mg/dL (ref 8.6–10.2)
CHLORIDE: 107 mmol/L — AB (ref 96–106)
CO2: 24 mmol/L (ref 20–29)
Creatinine, Ser: 1.13 mg/dL (ref 0.76–1.27)
GFR calc Af Amer: 73 mL/min/{1.73_m2} (ref >59)
GFR calc non Af Amer: 63 mL/min/{1.73_m2} (ref >59)
Glucose: 120 mg/dL — ABNORMAL HIGH (ref 65–99)
POTASSIUM: 4.1 mmol/L (ref 3.5–5.2)
Sodium: 143 mmol/L (ref 134–144)

## 2018-03-06 ENCOUNTER — Encounter: Payer: Self-pay | Admitting: Family Medicine

## 2018-03-07 DIAGNOSIS — D61818 Other pancytopenia: Secondary | ICD-10-CM | POA: Diagnosis not present

## 2018-03-07 DIAGNOSIS — G7000 Myasthenia gravis without (acute) exacerbation: Secondary | ICD-10-CM | POA: Diagnosis not present

## 2018-03-13 DIAGNOSIS — E559 Vitamin D deficiency, unspecified: Secondary | ICD-10-CM | POA: Diagnosis not present

## 2018-03-13 DIAGNOSIS — G7000 Myasthenia gravis without (acute) exacerbation: Secondary | ICD-10-CM | POA: Diagnosis not present

## 2018-03-13 DIAGNOSIS — E119 Type 2 diabetes mellitus without complications: Secondary | ICD-10-CM | POA: Diagnosis not present

## 2018-03-15 DIAGNOSIS — G7000 Myasthenia gravis without (acute) exacerbation: Secondary | ICD-10-CM | POA: Diagnosis not present

## 2018-03-17 DIAGNOSIS — Z7689 Persons encountering health services in other specified circumstances: Secondary | ICD-10-CM | POA: Diagnosis not present

## 2018-03-17 DIAGNOSIS — G7000 Myasthenia gravis without (acute) exacerbation: Secondary | ICD-10-CM | POA: Diagnosis not present

## 2018-03-20 DIAGNOSIS — G7000 Myasthenia gravis without (acute) exacerbation: Secondary | ICD-10-CM | POA: Diagnosis not present

## 2018-03-21 ENCOUNTER — Telehealth: Payer: Self-pay | Admitting: Family Medicine

## 2018-03-21 NOTE — Telephone Encounter (Signed)
Was previously sent. Scanned into Media yesterday. Will call [facility] to see what else they may need.

## 2018-03-21 NOTE — Telephone Encounter (Signed)
Copied from [place] [id number]. Topic: Quick Communication - See Telephone Encounter >> Mar 21, 2018  1:28 PM Bea Graff, NT wrote: CRM for notification. See Telephone encounter for: 03/21/18. Pts wife states that her husband is to pick up CPAP supplies on June 6th at [facility] on [address] and a rx needs to be sent over to them so that he may be able to pick those supplies up. [facility] phone number: [phone]. Please advise pt once sent.

## 2018-03-22 DIAGNOSIS — G7000 Myasthenia gravis without (acute) exacerbation: Secondary | ICD-10-CM | POA: Diagnosis not present

## 2018-03-22 NOTE — Telephone Encounter (Signed)
Called [facility] they do have RX. Attempted to call patient, no answer. VM left.

## 2018-03-24 DIAGNOSIS — G7000 Myasthenia gravis without (acute) exacerbation: Secondary | ICD-10-CM | POA: Diagnosis not present

## 2018-04-14 DIAGNOSIS — E559 Vitamin D deficiency, unspecified: Secondary | ICD-10-CM | POA: Diagnosis not present

## 2018-04-28 DIAGNOSIS — R161 Splenomegaly, not elsewhere classified: Secondary | ICD-10-CM | POA: Insufficient documentation

## 2018-05-03 DIAGNOSIS — R7989 Other specified abnormal findings of blood chemistry: Secondary | ICD-10-CM | POA: Diagnosis not present

## 2018-05-03 DIAGNOSIS — F17290 Nicotine dependence, other tobacco product, uncomplicated: Secondary | ICD-10-CM | POA: Diagnosis not present

## 2018-05-03 DIAGNOSIS — D61818 Other pancytopenia: Secondary | ICD-10-CM | POA: Diagnosis not present

## 2018-05-03 DIAGNOSIS — Z8639 Personal history of other endocrine, nutritional and metabolic disease: Secondary | ICD-10-CM | POA: Diagnosis not present

## 2018-05-03 DIAGNOSIS — E611 Iron deficiency: Secondary | ICD-10-CM | POA: Diagnosis not present

## 2018-05-03 DIAGNOSIS — K7581 Nonalcoholic steatohepatitis (NASH): Secondary | ICD-10-CM | POA: Diagnosis not present

## 2018-05-03 DIAGNOSIS — K7460 Unspecified cirrhosis of liver: Secondary | ICD-10-CM | POA: Diagnosis not present

## 2018-05-05 ENCOUNTER — Ambulatory Visit (INDEPENDENT_AMBULATORY_CARE_PROVIDER_SITE_OTHER): Payer: Medicare Other

## 2018-05-05 VITALS — BP 118/62 | HR 75 | Temp 98.6°F | Resp 17 | Ht 70.5 in | Wt 234.4 lb

## 2018-05-05 DIAGNOSIS — Z0000 Encounter for general adult medical examination without abnormal findings: Secondary | ICD-10-CM

## 2018-05-05 DIAGNOSIS — Z23 Encounter for immunization: Secondary | ICD-10-CM

## 2018-05-05 DIAGNOSIS — S41112A Laceration without foreign body of left upper arm, initial encounter: Secondary | ICD-10-CM | POA: Diagnosis not present

## 2018-05-05 NOTE — Patient Instructions (Addendum)
Mason Wilson , Thank you for taking time to come for yourMedicare Wellness Visit. I appreciate your ongoing commitment to your health goals. Please review the following plan we discussed and let me know if I can assist you in the future.   Screening recommendations/referrals: Colonoscopy: Completed 08/08/2014 Recommended yearly ophthalmology/optometry visit for glaucoma screening and checkup Recommended yearly dental visit for hygiene and checkup  Vaccinations: Influenza vaccine: up to date, due 07/2018 Pneumococcal vaccine: up to date Tdap vaccine: up to date Shingles vaccine: up to date   Advanced directives: Copy of Advanced Directives in chart, you requested no changes at this time   Conditions/risks identified: Recommend drinking at least 6-8 glasses of water a day  Next appointment: Follow up on 09/05/2018 at 10:00am with Dr.Crissman.  Follow up in one year for your annual wellness exam.  Preventive Care 65 Years and Older, Male Preventive care refers to lifestyle choices and visits with your health care provider that can promote health and wellness. What does preventive care include?  A yearly physical exam. This is also called an annual well check.  Dental exams once or twice a year.  Routine eye exams. Ask your health care provider how often you should have your eyes checked.  Personal lifestyle choices, including:  Daily care of your teeth and gums.  Regular physical activity.  Eating a healthy diet.  Avoiding tobacco and drug use.  Limiting alcohol use.  Practicing safe sex.  Taking low doses of aspirin every day.  Taking vitamin and mineral supplements as recommended by your health care provider. What happens during an annual well check? The services and screenings done by your health care provider during your annual well check will depend on your age, overall health, lifestyle risk factors, and family history of disease. Counseling  Your health care  provider may ask you questions about your:  Alcohol use.  Tobacco use.  Drug use.  Emotional well-being.  Home and relationship well-being.  Sexual activity.  Eating habits.  History of falls.  Memory and ability to understand (cognition).  Work and work Statistician. Screening  You may have the following tests or measurements:  Height, weight, and BMI.  Blood pressure.  Lipid and cholesterol levels. These may be checked every 5 years, or more frequently if you are over 82 years old.  Skin check.  Lung cancer screening. You may have this screening every year starting at age 22 if you have a 30-pack-year history of smoking and currently smoke or have quit within the past 15 years.  Fecal occult blood test (FOBT) of the stool. You may have this test every year starting at age 7.  Flexible sigmoidoscopy or colonoscopy. You may have a sigmoidoscopy every 5 years or a colonoscopy every 10 years starting at age 48.  Prostate cancer screening. Recommendations will vary depending on your family history and other risks.  Hepatitis C blood test.  Hepatitis B blood test.  Sexually transmitted disease (STD) testing.  Diabetes screening. This is done by checking your blood sugar (glucose) after you have not eaten for a while (fasting). You may have this done every 1-3 years.  Abdominal aortic aneurysm (AAA) screening. You may need this if you are a current or former smoker.  Osteoporosis. You may be screened starting at age 45 if you are at high risk. Talk with your health care provider about your test results, treatment options, and if necessary, the need for more tests. Vaccines  Your health care provider may  recommend certain vaccines, such as:  Influenza vaccine. This is recommended every year.  Tetanus, diphtheria, and acellular pertussis (Tdap, Td) vaccine. You may need a Td booster every 10 years.  Zoster vaccine. You may need this after age 33.  Pneumococcal  13-valent conjugate (PCV13) vaccine. One dose is recommended after age 33.  Pneumococcal polysaccharide (PPSV23) vaccine. One dose is recommended after age 72. Talk to your health care provider about which screenings and vaccines you need and how often you need them. This information is not intended to replace advice given to you by your health care provider. Make sure you discuss any questions you have with your health care provider. Document Released: 11/21/2015 Document Revised: 07/14/2016 Document Reviewed: 08/26/2015 Elsevier Interactive Patient Education  2017 [place] Prevention in the Home Falls can cause injuries. They can happen to people of all ages. There are many things you can do to make your home safe and to help prevent falls. What can I do on the outside of my home?  Regularly fix the edges of walkways and driveways and fix any cracks.  Remove anything that might make you trip as you walk through a door, such as a raised step or threshold.  Trim any bushes or trees on the path to your home.  Use bright outdoor lighting.  Clear any walking paths of anything that might make someone trip, such as rocks or tools.  Regularly check to see if handrails are loose or broken. Make sure that both sides of any steps have handrails.  Any raised decks and porches should have guardrails on the edges.  Have any leaves, snow, or ice cleared regularly.  Use sand or salt on walking paths during winter.  Clean up any spills in your garage right away. This includes oil or grease spills. What can I do in the bathroom?  Use night lights.  Install grab bars by the toilet and in the tub and shower. Do not use towel bars as grab bars.  Use non-skid mats or decals in the tub or shower.  If you need to sit down in the shower, use a plastic, non-slip stool.  Keep the floor dry. Clean up any water that spills on the floor as soon as it happens.  Remove soap buildup in the  tub or shower regularly.  Attach bath mats securely with double-sided non-slip rug tape.  Do not have throw rugs and other things on the floor that can make you trip. What can I do in the bedroom?  Use night lights.  Make sure that you have a light by your bed that is easy to reach.  Do not use any sheets or blankets that are too big for your bed. They should not hang down onto the floor.  Have a firm chair that has side arms. You can use this for support while you get dressed.  Do not have throw rugs and other things on the floor that can make you trip. What can I do in the kitchen?  Clean up any spills right away.  Avoid walking on wet floors.  Keep items that you use a lot in easy-to-reach places.  If you need to reach something above you, use a strong step stool that has a grab bar.  Keep electrical cords out of the way.  Do not use floor polish or wax that makes floors slippery. If you must use wax, use non-skid floor wax.  Do not have throw rugs and  other things on the floor that can make you trip. What can I do with my stairs?  Do not leave any items on the stairs.  Make sure that there are handrails on both sides of the stairs and use them. Fix handrails that are broken or loose. Make sure that handrails are as long as the stairways.  Check any carpeting to make sure that it is firmly attached to the stairs. Fix any carpet that is loose or worn.  Avoid having throw rugs at the top or bottom of the stairs. If you do have throw rugs, attach them to the floor with carpet tape.  Make sure that you have a light switch at the top of the stairs and the bottom of the stairs. If you do not have them, ask someone to add them for you. What else can I do to help prevent falls?  Wear shoes that:  Do not have high heels.  Have rubber bottoms.  Are comfortable and fit you well.  Are closed at the toe. Do not wear sandals.  If you use a stepladder:  Make sure that it  is fully opened. Do not climb a closed stepladder.  Make sure that both sides of the stepladder are locked into place.  Ask someone to hold it for you, if possible.  Clearly mark and make sure that you can see:  Any grab bars or handrails.  First and last steps.  Where the edge of each step is.  Use tools that help you move around (mobility aids) if they are needed. These include:  Canes.  Walkers.  Scooters.  Crutches.  Turn on the lights when you go into a dark area. Replace any light bulbs as soon as they burn out.  Set up your furniture so you have a clear path. Avoid moving your furniture around.  If any of your floors are uneven, fix them.  If there are any pets around you, be aware of where they are.  Review your medicines with your doctor. Some medicines can make you feel dizzy. This can increase your chance of falling. Ask your doctor what other things that you can do to help prevent falls. This information is not intended to replace advice given to you by your health care provider. Make sure you discuss any questions you have with your health care provider. Document Released: 08/21/2009 Document Revised: 04/01/2016 Document Reviewed: 11/29/2014 Elsevier Interactive Patient Education  2017 [employer].

## 2018-05-05 NOTE — Progress Notes (Signed)
Subjective:   Mason Wilson. is a 77 y.o. male who presents for Medicare Annual/Subsequent preventive examination.  Review of Systems:   Cardiac Risk Factors include: dyslipidemia;advanced age (>27men, >36 women);hypertension;male gender;diabetes mellitus;obesity (BMI >30kg/m2);smoking/ tobacco exposure     Objective:    Vitals: BP 118/62 (BP Location: Left Arm, Patient Position: Sitting)   Pulse 75   Temp 98.6 F (37 C) (Temporal)   Resp 17   Ht 5' 10.5" (1.791 m)   Wt 234 lb 6.4 oz (106.3 kg)   BMI 33.16 kg/m   Body mass index is 33.16 kg/m.  Advanced Directives 05/05/2018 04/29/2017 09/22/2016 06/23/2016 02/09/2016 09/19/2015 09/08/2015  Does Patient Have a Medical Advance Directive? Yes Yes Yes Yes Yes No Yes  Type of Advance Directive Living will;Healthcare Power of [place];Living will Living will;Healthcare Power of [place];Living will - - [place];Living will  Does patient want to make changes to medical advance directive? - - - No - Patient declined - - -  Copy of [place] in Chart? No - copy requested Yes - No - copy requested - - No - copy requested  Would patient like information on creating a medical advance directive? - - - - - No - patient declined information -    Tobacco Social History   Tobacco Use  Smoking Status Former Smoker  . Types: Cigarettes  . Last attempt to quit: 04/02/1970  . Years since quitting: 48.1  Smokeless Tobacco Current User  . Types: Chew  Tobacco Comment   approx quit in 1971      Ready to quit: Not Answered Counseling given: Not Answered Comment: approx quit in 1971    Clinical Intake:  Pre-visit preparation completed: Yes  Pain : No/denies pain     Nutritional Status: BMI > 30  Obese Nutritional Risks: None  How often do you need to have someone help you when you read instructions, pamphlets, or other written materials  from your doctor or pharmacy?: 1 - Never What is the last grade level you completed in school?: high school   Interpreter Needed?: No  Information entered by :: Tiffany Hill,LPN   Past Medical History:  Diagnosis Date  . Allergy   . Arthritis    "everywhere' - worse in back  . Body mass index 39.0-39.9, adult   . CAD (coronary artery disease)   . Chronic back pain   . Diabetes mellitus without complication ([place])   . Elevated liver enzymes   . GERD (gastroesophageal reflux disease)   . Heart murmur   . Hyperlipidemia   . Hypogonadism in male   . Melanoma ([place])    melanoma   . Melanoma in situ of right shoulder ([place])   . Myasthenia gravis ([place])   . Renal stones   . Sleep apnea    CPAP  . Uses hearing aid    doesn't wear   Past Surgical History:  Procedure Laterality Date  . ANGIOPLASTY  1999   3 stents  . CARPAL TUNNEL RELEASE     x2  . CATARACT EXTRACTION, BILATERAL    . ESOPHAGOGASTRODUODENOSCOPY (EGD) WITH PROPOFOL N/A 09/08/2015   Procedure: ESOPHAGOGASTRODUODENOSCOPY (EGD) WITH PROPOFOL;  Surgeon: Lucilla Lame, MD;  Location: [facility];  Service: Endoscopy;  Laterality: N/A;  CPAP Diabetic - oral meds  . LIVER BIOPSY    . SKIN LESION EXCISION  1998  . SKIN LESION EXCISION  skin cancer removed on forehead in June 2018  . TONSILLECTOMY     age 30   Family History  Problem Relation Age of Onset  . Thyroid disease Mother   . Alzheimer's disease Mother   . Heart disease Mother   . Cancer Father 43       testicular  . Heart disease Father   . Hypertension Father   . Heart attack Father   . Kidney disease Neg Hx   . Prostate cancer Neg Hx   . Kidney cancer Neg Hx   . Bladder Cancer Neg Hx    Social History   Socioeconomic History  . Marital status: Married    Spouse name: Not on file  . Number of children: Not on file  . Years of education: Not on file  . Highest education level: Not on file  Occupational History  . Not on file  Social  Needs  . Financial resource strain: Not hard at all  . Food insecurity:    Worry: Never true    Inability: Never true  . Transportation needs:    Medical: No    Non-medical: No  Tobacco Use  . Smoking status: Former Smoker    Types: Cigarettes    Last attempt to quit: 04/02/1970    Years since quitting: 48.1  . Smokeless tobacco: Current User    Types: Chew  . Tobacco comment: approx quit in 1971   Substance and Sexual Activity  . Alcohol use: No    Alcohol/week: 0.0 oz    Comment: stopped in 1996  . Drug use: No  . Sexual activity: Yes    Partners: Female  Lifestyle  . Physical activity:    Days per week: 0 days    Minutes per session: 0 min  . Stress: Not at all  Relationships  . Social connections:    Talks on phone: More than three times a week    Gets together: Once a week    Attends religious service: Never    Active member of club or organization: No    Attends meetings of clubs or organizations: Never    Relationship status: Married  Other Topics Concern  . Not on file  Social History Narrative  . Not on file    Outpatient Encounter Medications as of 05/05/2018  Medication Sig  . aspirin EC 81 MG tablet Take 81 mg by mouth daily.  . Calcium Carbonate-Vit D-Min (CALCIUM 1200 PO) Take 1,200 mg by mouth.  Larita Fife VITAMIN D3 MAX 67893 units TABS TK 1 T PO 1 TIME A WK FOR 8 WKS  . Ferrous Fumarate (IRON) 18 MG TBCR Take 18 mg by mouth.  Marland Kitchen glipiZIDE (GLUCOTROL) 5 MG tablet Take 1 tablet (5 mg total) by mouth daily before breakfast.  . hydrochlorothiazide (HYDRODIURIL) 25 MG tablet Take 1 tablet (25 mg total) by mouth daily.  Marland Kitchen losartan (COZAAR) 100 MG tablet Take 1 tablet (100 mg total) by mouth daily.  . metFORMIN (GLUCOPHAGE) 500 MG tablet TAKE 2 TABLETS(1000 MG) BY MOUTH TWICE DAILY WITH A MEAL  . metoprolol succinate (TOPROL-XL) 50 MG 24 hr tablet Take 1 tablet (50 mg total) by mouth daily. Take with or immediately following a meal.  . omeprazole  (PRILOSEC) 20 MG capsule Take 20 mg by mouth daily. AM  . predniSONE (DELTASONE) 20 MG tablet Take by mouth.  . pyridostigmine (MESTINON) 60 MG tablet Take 1.5 tab (90 mg total by mouth three times a day  .  simvastatin (ZOCOR) 40 MG tablet Take 1 tablet (40 mg total) by mouth daily at 6 PM.  . tamsulosin (FLOMAX) 0.4 MG CAPS capsule TAKE 1 CAPSULE(0.4 MG) BY MOUTH DAILY  . tiZANidine (ZANAFLEX) 4 MG tablet Take 4 mg by mouth every 6 (six) hours as needed for muscle spasms.  . traMADol (ULTRAM) 50 MG tablet Take 1 tablet (50 mg total) by mouth every 6 (six) hours as needed.  . clobetasol cream (TEMOVATE) 0.05 % As needed  . levofloxacin (LEVAQUIN) 750 MG tablet Take 1 tablet (750 mg total) by mouth daily. (Patient not taking: Reported on 05/05/2018)   No facility-administered encounter medications on file as of 05/05/2018.     Activities of Daily Living In your present state of health, do you have any difficulty performing the following activities: 05/05/2018  Hearing? Y  Comment has bilateral hearing aids   Vision? N  Difficulty concentrating or making decisions? N  Walking or climbing stairs? N  Dressing or bathing? N  Doing errands, shopping? N  Preparing Food and eating ? N  Using the Toilet? N  In the past six months, have you accidently leaked urine? N  Do you have problems with loss of bowel control? N  Managing your Medications? N  Managing your Finances? N  Housekeeping or managing your Housekeeping? N  Some recent data might be hidden    Patient Care Team: [name], MD as PCP - General (Family Medicine) Lucilla Lame, MD as Consulting Physician (Gastroenterology) Cammie Sickle, MD as Consulting Physician (Hematology and Oncology)   Assessment:   This is a routine wellness examination for Fong.  Exercise Activities and Dietary recommendations Current Exercise Habits: The patient does not participate in regular exercise at present, Exercise limited by:  None identified  Goals    . DIET - INCREASE WATER INTAKE     Recommend drinking at least 6-8 glasses of water a day        Fall Risk Fall Risk  05/05/2018 03/02/2018 12/08/2017 11/22/2017 05/10/2017  Falls in the past year? No No No No No   Is the patient's home free of loose throw rugs in walkways, pet beds, electrical cords, etc?   yes      Grab bars in the bathroom? yes      Handrails on the stairs?   yes       Adequate lighting?   yes  Timed Get Up and Go Performed: Completed in 9 seconds with no use of assistive devices, steady gait. No intervention needed at this time.   Depression Screen PHQ 2/9 Scores 05/05/2018 03/02/2018 12/08/2017 05/10/2017  PHQ - 2 Score 1 0 0 0  PHQ- 9 Score 3 - - -    Cognitive Function     6CIT Screen 05/05/2018 04/29/2017  What Year? 0 points 0 points  What month? 0 points 0 points  What time? 0 points 0 points  Count back from 20 0 points 0 points  Months in reverse 0 points 0 points  Repeat phrase 0 points 0 points  Total Score 0 0    Immunization History  Administered Date(s) Administered  . Influenza, High Dose Seasonal PF 08/09/2016, 09/01/2017  . Influenza,inj,Quad PF,6+ Mos 07/28/2015  . Influenza-Unspecified 08/08/2014  . Pneumococcal Conjugate-13 04/18/2014  . Pneumococcal-Unspecified 07/09/1998, 04/17/2007  . Td 04/22/2008  . Tdap 05/05/2018  . Zoster 04/08/2009    Qualifies for Shingles Vaccine?  Yes, discussed shingrix vaccine   Screening Tests Health Maintenance  Topic  Date Due  . OPHTHALMOLOGY EXAM  05/18/2017  . INFLUENZA VACCINE  06/08/2018  . HEMOGLOBIN A1C  09/01/2018  . FOOT EXAM  03/03/2019  . COLONOSCOPY  08/08/2024  . TETANUS/TDAP  05/05/2028   Cancer Screenings: Lung: Low Dose CT Chest recommended if Age 56-80 years, 30 pack-year currently smoking OR have quit w/in 15years. Patient does not qualify. Colorectal: completed 08/08/2014  Additional Screenings:  Hepatitis C Screening:not indicated       Plan:      I have personally reviewed and addressed the Medicare Annual Wellness questionnaire and have noted the following in the patient's chart:  A. Medical and social history B. Use of alcohol, tobacco or illicit drugs  C. Current medications and supplements D. Functional ability and status E.  Nutritional status F.  Physical activity G. Advance directives H. List of other physicians I.  Hospitalizations, surgeries, and ER visits in previous 12 months J.  [place] such as hearing and vision if needed, cognitive and depression L. Referrals and appointments   In addition, I have reviewed and discussed with patient certain preventive protocols, quality metrics, and best practice recommendations. A written personalized care plan for preventive services as well as general preventive health recommendations were provided to patient.   Signed,  Tyler Aas, LPN Nurse Health Advisor   Nurse Notes: will call Dr.Bell's office for diabetic eye exam results, patient states done in the spring.

## 2018-05-26 ENCOUNTER — Other Ambulatory Visit: Payer: Self-pay | Admitting: *Deleted

## 2018-05-26 DIAGNOSIS — D509 Iron deficiency anemia, unspecified: Secondary | ICD-10-CM

## 2018-05-30 ENCOUNTER — Other Ambulatory Visit: Payer: Self-pay | Admitting: Internal Medicine

## 2018-05-30 DIAGNOSIS — D500 Iron deficiency anemia secondary to blood loss (chronic): Secondary | ICD-10-CM | POA: Insufficient documentation

## 2018-05-31 ENCOUNTER — Inpatient Hospital Stay (HOSPITAL_BASED_OUTPATIENT_CLINIC_OR_DEPARTMENT_OTHER): Payer: Medicare Other | Admitting: Internal Medicine

## 2018-05-31 ENCOUNTER — Inpatient Hospital Stay: Payer: Medicare Other | Attending: Internal Medicine

## 2018-05-31 ENCOUNTER — Encounter: Payer: Self-pay | Admitting: Internal Medicine

## 2018-05-31 ENCOUNTER — Other Ambulatory Visit: Payer: Self-pay

## 2018-05-31 ENCOUNTER — Inpatient Hospital Stay: Payer: Medicare Other

## 2018-05-31 VITALS — BP 129/70 | HR 66 | Temp 97.9°F | Resp 20 | Ht 70.5 in | Wt 241.0 lb

## 2018-05-31 DIAGNOSIS — K7460 Unspecified cirrhosis of liver: Secondary | ICD-10-CM | POA: Insufficient documentation

## 2018-05-31 DIAGNOSIS — D61818 Other pancytopenia: Secondary | ICD-10-CM | POA: Insufficient documentation

## 2018-05-31 DIAGNOSIS — R161 Splenomegaly, not elsewhere classified: Secondary | ICD-10-CM

## 2018-05-31 DIAGNOSIS — D500 Iron deficiency anemia secondary to blood loss (chronic): Secondary | ICD-10-CM

## 2018-05-31 DIAGNOSIS — D509 Iron deficiency anemia, unspecified: Secondary | ICD-10-CM | POA: Diagnosis not present

## 2018-05-31 LAB — CBC WITH DIFFERENTIAL/PLATELET
BASOS PCT: 1 %
Basophils Absolute: 0 10*3/uL (ref 0–0.1)
EOS ABS: 0 10*3/uL (ref 0–0.7)
EOS PCT: 0 %
HCT: 28.9 % — ABNORMAL LOW (ref 40.0–52.0)
Hemoglobin: 9.4 g/dL — ABNORMAL LOW (ref 13.0–18.0)
LYMPHS ABS: 0.2 10*3/uL — AB (ref 1.0–3.6)
Lymphocytes Relative: 9 %
MCH: 26.7 pg (ref 26.0–34.0)
MCHC: 32.3 g/dL (ref 32.0–36.0)
MCV: 82.4 fL (ref 80.0–100.0)
Monocytes Absolute: 0.1 10*3/uL — ABNORMAL LOW (ref 0.2–1.0)
Monocytes Relative: 4 %
Neutro Abs: 2.4 10*3/uL (ref 1.4–6.5)
Neutrophils Relative %: 86 %
PLATELETS: 81 10*3/uL — AB (ref 150–440)
RBC: 3.51 MIL/uL — ABNORMAL LOW (ref 4.40–5.90)
RDW: 17.2 % — AB (ref 11.5–14.5)
WBC: 2.8 10*3/uL — AB (ref 3.8–10.6)

## 2018-05-31 LAB — IRON AND TIBC
Iron: 34 ug/dL — ABNORMAL LOW (ref 45–182)
SATURATION RATIOS: 9 % — AB (ref 17.9–39.5)
TIBC: 381 ug/dL (ref 250–450)
UIBC: 347 ug/dL

## 2018-05-31 LAB — FERRITIN: Ferritin: 9 ng/mL — ABNORMAL LOW (ref 24–336)

## 2018-05-31 MED ORDER — SODIUM CHLORIDE 0.9 % IV SOLN
Freq: Once | INTRAVENOUS | Status: AC
  Administered 2018-05-31: 14:00:00 via INTRAVENOUS
  Filled 2018-05-31: qty 1000

## 2018-05-31 MED ORDER — IRON SUCROSE 20 MG/ML IV SOLN
200.0000 mg | Freq: Once | INTRAVENOUS | Status: AC
  Administered 2018-05-31: 200 mg via INTRAVENOUS
  Filled 2018-05-31: qty 10

## 2018-05-31 NOTE — Progress Notes (Signed)
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  As per the family in the interim patient was diagnosed with myasthenia gravis-he noted to have plasma exchanges at [facility].  At [facility] we will also  Noted to have iron deficiency; evaluated by hematology and [facility]-recommended IV iron supplementation.  Patient denies any blood in stools.  In the interim patient denies any bleeding or nosebleeds.  Denies any worsening swelling in the legs.  Denies any unusual abdominal pain or abdominal swelling.  Patient continues her chronic bruising.   REVIEW OF SYSTEMS:  A complete 10 point review of system is done which is negative except mentioned above in history of present illness  I have reviewed the past medical history, past surgical history, social history and family history with the patient and they are unchanged from previous note unless stated above.  ALLERGIES:  is allergic to altace [ramipril].  MEDICATIONS:  Current Outpatient Medications  Medication Sig Dispense Refill  . Calcium Carbonate-Vit D-Min (CALCIUM 1200 PO) Take 1,200 mg by mouth.    Larita Fife VITAMIN D3 MAX 50093 units TABS TK 1 T PO 1 TIME A WK FOR 8 WKS  0  . Ferrous Fumarate (IRON) 18 MG TBCR Take 18 mg by mouth.    Marland Kitchen glipiZIDE (GLUCOTROL) 5 MG tablet Take 1 tablet (5 mg total) by mouth daily before breakfast. 90 tablet 4  . hydrochlorothiazide (HYDRODIURIL) 25 MG tablet Take 1  tablet (25 mg total) by mouth daily. 90 tablet 4  . losartan (COZAAR) 100 MG tablet Take 1 tablet (100 mg total) by mouth daily. 90 tablet 4  . metFORMIN (GLUCOPHAGE) 500 MG tablet TAKE 2 TABLETS(1000 MG) BY MOUTH TWICE DAILY WITH A MEAL 360 tablet 0  . metoprolol succinate (TOPROL-XL) 50 MG 24 hr tablet Take 1 tablet (50 mg total) by mouth daily. Take with or immediately following a meal. 90 tablet 4  . omeprazole (PRILOSEC) 20 MG capsule Take 20 mg by mouth daily. AM    . predniSONE (DELTASONE) 20 MG tablet Take by mouth.    . simvastatin (ZOCOR) 40 MG tablet Take 1 tablet (40 mg total) by mouth daily at 6 PM. 90 tablet 4  . tamsulosin (FLOMAX) 0.4 MG CAPS capsule TAKE 1 CAPSULE(0.4 MG) BY MOUTH DAILY 90 capsule 3  . tiZANidine (ZANAFLEX) 4 MG tablet Take 4 mg by mouth every 6 (six) hours as needed for muscle spasms.    . traMADol (ULTRAM) 50 MG tablet Take 1 tablet (50 mg total) by mouth every 6 (six) hours as needed. 120 tablet 0  . clobetasol cream (TEMOVATE) 0.05 % As needed  1   No current facility-administered medications for this visit.     PHYSICAL EXAMINATION:   BP 129/70 (Patient Position: Sitting)   Pulse 66   Temp 97.9 F (36.6 C) (Tympanic)   Resp 20   Ht 5' 10.5" (1.791 m)   Wt 241 lb (109.3 kg)   BMI 34.09 kg/m   [employer]  05/31/18 1330  Weight: 241 lb (109.3 kg)    GENERAL: Well-nourished well-developed; Alert, no distress and comfortable.   Accompanied by his wife. EYES: no pallor or icterus OROPHARYNX: no thrush or ulceration; good dentition  NECK: supple, no masses felt LYMPH:  no palpable lymphadenopathy in the cervical, axillary or inguinal regions LUNGS: clear to auscultation and  No wheeze or crackles HEART/CVS: regular rate & rhythm and no murmurs; No lower extremity edema ABDOMEN:abdomen soft, non-tender and normal bowel sounds Musculoskeletal:no cyanosis of digits and no clubbing; positive for splenomegaly  PSYCH: alert & oriented x 3 with  fluent speech NEURO: no focal motor/sensory deficits SKIN:  no rashes or significant lesions   LABORATORY DATA:  I have reviewed the data as listed    Component Value Date/Time   NA 143 03/02/2018 0920   K 4.1 03/02/2018 0920   CL 107 (H) 03/02/2018 0920   CO2 24 03/02/2018 0920   GLUCOSE 120 (H) 03/02/2018 0920   GLUCOSE 91 09/26/2017 1051   BUN 21 03/02/2018 0920   CREATININE 1.13 03/02/2018 0920   CALCIUM 9.4 03/02/2018 0920   PROT 7.2 09/26/2017 1051   PROT 6.8 09/01/2017 1014   ALBUMIN 4.0 09/26/2017 1051   ALBUMIN 4.1 09/01/2017 1014   AST 43 (H) 03/02/2018 0902   ALT 49 (H) 03/02/2018 0902   ALKPHOS 78 09/26/2017 1051   BILITOT 0.9 09/26/2017 1051   BILITOT 0.5 09/01/2017 1014   GFRNONAA 63 03/02/2018 0920   GFRAA 73 03/02/2018 0920    No results found for: SPEP, UPEP  Lab Results  Component Value Date   WBC 2.8 (L) 05/31/2018   NEUTROABS 2.4 05/31/2018   HGB 9.4 (L) 05/31/2018   HCT 28.9 (L) 05/31/2018   MCV 82.4 05/31/2018   PLT 81 (L) 05/31/2018      Chemistry      Component Value Date/Time   NA 143 03/02/2018 0920   K 4.1 03/02/2018 0920   CL 107 (H) 03/02/2018 0920   CO2 24 03/02/2018 0920   BUN 21 03/02/2018 0920   CREATININE 1.13 03/02/2018 0920      Component Value Date/Time   CALCIUM 9.4 03/02/2018 0920   ALKPHOS 78 09/26/2017 1051   AST 43 (H) 03/02/2018 0902   ALT 49 (H) 03/02/2018 0902   BILITOT 0.9 09/26/2017 1051   BILITOT 0.5 09/01/2017 1014       ASSESSMENT & PLAN:   Other pancytopenia ([place]) # Pancytopenia- Asymptomatic. Today labs white count 2.8 hemoglobin 9.8; platelets of 81-secondary to cirrhosis and portal hypertension splenomegaly [stable except for anemia -see discussion below].  #Worsening anemia hemoglobin 9.8 symptomatic-iron studies suggestive of iron deficiency; recommend IV iron.   # Cirrhosis/portal hypertension- Question Karlene Lineman s/p Bx. follows up with Dr. Rohrman Norris.  Stable.  # MG-as per neurology status post  plasma exchange.  Improvement.  # venofer x3; nov as [planned labs/Iv venofer]-patient has multiple appointments with multiple specialties in between.  If concern for continued anemia/low iron-wife will call us for infusions interim.  CC: Dr.Crissman.       Cammie Sickle, MD 06/03/2018 9:01 AM

## 2018-05-31 NOTE — Assessment & Plan Note (Addendum)
#   Pancytopenia- Asymptomatic. Today labs white count 2.8 hemoglobin 9.8; platelets of 81-secondary to cirrhosis and portal hypertension splenomegaly [stable except for anemia -see discussion below].  #Worsening anemia hemoglobin 9.8 symptomatic-iron studies suggestive of iron deficiency; recommend IV iron.   # Cirrhosis/portal hypertension- Question Karlene Lineman s/p Bx. follows up with Dr. Novacek Norris.  Stable.  # MG-as per neurology status post plasma exchange.  Improvement.  # venofer x3; nov as [planned labs/Iv venofer]-patient has multiple appointments with multiple specialties in between.  If concern for continued anemia/low iron-wife will call us for infusions interim.  CC: Dr.Crissman.

## 2018-06-07 ENCOUNTER — Other Ambulatory Visit: Payer: Self-pay

## 2018-06-07 ENCOUNTER — Inpatient Hospital Stay: Payer: Medicare Other

## 2018-06-07 ENCOUNTER — Encounter: Payer: Self-pay | Admitting: Gastroenterology

## 2018-06-07 ENCOUNTER — Encounter (INDEPENDENT_AMBULATORY_CARE_PROVIDER_SITE_OTHER): Payer: Self-pay

## 2018-06-07 ENCOUNTER — Ambulatory Visit (INDEPENDENT_AMBULATORY_CARE_PROVIDER_SITE_OTHER): Payer: Medicare Other | Admitting: Gastroenterology

## 2018-06-07 VITALS — BP 105/63 | HR 69 | Temp 99.0°F | Resp 20

## 2018-06-07 DIAGNOSIS — D500 Iron deficiency anemia secondary to blood loss (chronic): Secondary | ICD-10-CM

## 2018-06-07 DIAGNOSIS — D61818 Other pancytopenia: Secondary | ICD-10-CM | POA: Diagnosis not present

## 2018-06-07 DIAGNOSIS — D509 Iron deficiency anemia, unspecified: Secondary | ICD-10-CM | POA: Diagnosis not present

## 2018-06-07 DIAGNOSIS — K7460 Unspecified cirrhosis of liver: Secondary | ICD-10-CM | POA: Diagnosis not present

## 2018-06-07 DIAGNOSIS — R161 Splenomegaly, not elsewhere classified: Secondary | ICD-10-CM | POA: Diagnosis not present

## 2018-06-07 MED ORDER — IRON SUCROSE 20 MG/ML IV SOLN
200.0000 mg | Freq: Once | INTRAVENOUS | Status: AC
  Administered 2018-06-07: 200 mg via INTRAVENOUS
  Filled 2018-06-07: qty 10

## 2018-06-07 MED ORDER — SODIUM CHLORIDE 0.9 % IV SOLN
Freq: Once | INTRAVENOUS | Status: AC
  Administered 2018-06-07: 13:00:00 via INTRAVENOUS
  Filled 2018-06-07: qty 1000

## 2018-06-07 NOTE — Progress Notes (Signed)
Primary Care Physician: [name], MD  Primary Gastroenterologist:  Dr. Lucilla Lame  No chief complaint on file.   HPI: Mason Greenlaw. is a 77 y.o. male here for anemia.  The patient was found to have iron deficiency anemia with a low iron saturation and a low total iron. The patient has been receiving iron by his hematologist and was sent to me for evaluation. His most recent CBC showed his hemoglobin today 9.4 with thrombocytopenia with platelets 81. The patient had an EGD for evaluation of varices back in 2016.  He does not report any recent colonoscopies. He denies any sign of bleeding such as black stools or bloody stools.  Current Outpatient Medications  Medication Sig Dispense Refill  . Calcium Carbonate-Vit D-Min (CALCIUM 1200 PO) Take 1,200 mg by mouth.    . clobetasol cream (TEMOVATE) 0.05 % As needed  1  . DIALYVITE VITAMIN D3 MAX 16010 units TABS TK 1 T PO 1 TIME A WK FOR 8 WKS  0  . Ferrous Fumarate (IRON) 18 MG TBCR Take 18 mg by mouth.    Marland Kitchen glipiZIDE (GLUCOTROL) 5 MG tablet Take 1 tablet (5 mg total) by mouth daily before breakfast. 90 tablet 4  . hydrochlorothiazide (HYDRODIURIL) 25 MG tablet Take 1 tablet (25 mg total) by mouth daily. 90 tablet 4  . losartan (COZAAR) 100 MG tablet Take 1 tablet (100 mg total) by mouth daily. 90 tablet 4  . metFORMIN (GLUCOPHAGE) 500 MG tablet TAKE 2 TABLETS(1000 MG) BY MOUTH TWICE DAILY WITH A MEAL 360 tablet 0  . metoprolol succinate (TOPROL-XL) 50 MG 24 hr tablet Take 1 tablet (50 mg total) by mouth daily. Take with or immediately following a meal. 90 tablet 4  . omeprazole (PRILOSEC) 20 MG capsule Take 20 mg by mouth daily. AM    . predniSONE (DELTASONE) 20 MG tablet Take by mouth.    . simvastatin (ZOCOR) 40 MG tablet Take 1 tablet (40 mg total) by mouth daily at 6 PM. 90 tablet 4  . tamsulosin (FLOMAX) 0.4 MG CAPS capsule TAKE 1 CAPSULE(0.4 MG) BY MOUTH DAILY 90 capsule 3  . tiZANidine (ZANAFLEX) 4 MG tablet Take 4 mg by  mouth every 6 (six) hours as needed for muscle spasms.    . traMADol (ULTRAM) 50 MG tablet Take 1 tablet (50 mg total) by mouth every 6 (six) hours as needed. 120 tablet 0   No current facility-administered medications for this visit.     Allergies as of 06/07/2018 - Review Complete 05/31/2018  Allergen Reaction Noted  . Altace [ramipril] Cough 04/03/2015    ROS:  General: Negative for anorexia, weight loss, fever, chills, fatigue, weakness. ENT: Negative for hoarseness, difficulty swallowing , nasal congestion. CV: Negative for chest pain, angina, palpitations, dyspnea on exertion, peripheral edema.  Respiratory: Negative for dyspnea at rest, dyspnea on exertion, cough, sputum, wheezing.  GI: See history of present illness. GU:  Negative for dysuria, hematuria, urinary incontinence, urinary frequency, nocturnal urination.  Endo: Negative for unusual weight change.    Physical Examination:   There were no vitals taken for this visit.  General: Well-nourished, well-developed in no acute distress.  Eyes: No icterus. Conjunctivae pink. Mouth: Oropharyngeal mucosa moist and pink , no lesions erythema or exudate. Lungs: Clear to auscultation bilaterally. Non-labored. Heart: Regular rate and rhythm, no murmurs rubs or gallops.  Abdomen: Bowel sounds are normal, nontender, nondistended, no hepatosplenomegaly or masses, no abdominal bruits or hernia , no rebound  or guarding.   Extremities: No lower extremity edema. No clubbing or deformities. Neuro: Alert and oriented x 3.  Grossly intact. Skin: Warm and dry, no jaundice.   Psych: Alert and cooperative, normal mood and affect.  Labs:    Imaging Studies: No results found.  Assessment and Plan:   Mason Brull. is a 77 y.o. y/o male with a history of iron deficiency anemia.  The patient has not had evaluation of his varices with an EGD in the last 3 years. The patient will be set up for EGD and colonoscopy to look for source of  his iron deficiency anemia. I have discussed risks & benefits which include, but are not limited to, bleeding, infection, perforation & drug reaction.  The patient agrees with this plan & written consent will be obtained.       Lucilla Lame, MD. Marval Regal   Note: This dictation was prepared with Dragon dictation along with smaller phrase technology. Any transcriptional errors that result from this process are unintentional.

## 2018-06-08 ENCOUNTER — Other Ambulatory Visit: Payer: Self-pay

## 2018-06-08 DIAGNOSIS — D500 Iron deficiency anemia secondary to blood loss (chronic): Secondary | ICD-10-CM

## 2018-06-14 ENCOUNTER — Inpatient Hospital Stay: Payer: Medicare Other | Attending: Internal Medicine

## 2018-06-14 VITALS — BP 108/64 | HR 64 | Temp 98.1°F | Resp 18

## 2018-06-14 DIAGNOSIS — D500 Iron deficiency anemia secondary to blood loss (chronic): Secondary | ICD-10-CM

## 2018-06-14 DIAGNOSIS — D509 Iron deficiency anemia, unspecified: Secondary | ICD-10-CM | POA: Diagnosis not present

## 2018-06-14 DIAGNOSIS — D61818 Other pancytopenia: Secondary | ICD-10-CM

## 2018-06-14 MED ORDER — IRON SUCROSE 20 MG/ML IV SOLN
200.0000 mg | Freq: Once | INTRAVENOUS | Status: AC
  Administered 2018-06-14: 200 mg via INTRAVENOUS
  Filled 2018-06-14: qty 10

## 2018-06-14 MED ORDER — SODIUM CHLORIDE 0.9 % IV SOLN
Freq: Once | INTRAVENOUS | Status: AC
  Administered 2018-06-14: 14:00:00 via INTRAVENOUS
  Filled 2018-06-14: qty 1000

## 2018-06-21 ENCOUNTER — Inpatient Hospital Stay: Payer: Medicare Other

## 2018-06-21 VITALS — BP 117/64 | HR 70 | Temp 98.1°F | Resp 18

## 2018-06-21 DIAGNOSIS — D500 Iron deficiency anemia secondary to blood loss (chronic): Secondary | ICD-10-CM

## 2018-06-21 DIAGNOSIS — D61818 Other pancytopenia: Secondary | ICD-10-CM

## 2018-06-21 DIAGNOSIS — D509 Iron deficiency anemia, unspecified: Secondary | ICD-10-CM | POA: Diagnosis not present

## 2018-06-21 MED ORDER — SODIUM CHLORIDE 0.9 % IV SOLN
Freq: Once | INTRAVENOUS | Status: AC
  Administered 2018-06-21: 14:00:00 via INTRAVENOUS
  Filled 2018-06-21: qty 1000

## 2018-06-21 MED ORDER — IRON SUCROSE 20 MG/ML IV SOLN
200.0000 mg | Freq: Once | INTRAVENOUS | Status: AC
  Administered 2018-06-21: 200 mg via INTRAVENOUS
  Filled 2018-06-21: qty 10

## 2018-06-26 ENCOUNTER — Other Ambulatory Visit: Payer: Self-pay

## 2018-06-26 ENCOUNTER — Encounter: Payer: Self-pay | Admitting: *Deleted

## 2018-06-27 NOTE — Anesthesia Preprocedure Evaluation (Addendum)
Anesthesia Evaluation  Patient identified by MRN, date of birth, ID band Patient awake    Reviewed: Allergy & Precautions, H&P , NPO status , Patient's Chart, lab work & pertinent test results  Airway Mallampati: II  TM Distance: >3 FB Neck ROM: full    Dental no notable dental hx.    Pulmonary sleep apnea and Continuous Positive Airway Pressure Ventilation , former smoker (quit 1971),    Pulmonary exam normal breath sounds clear to auscultation       Cardiovascular hypertension, + CAD and + Cardiac Stents  Normal cardiovascular exam Rhythm:regular Rate:Normal  Murmur    Neuro/Psych  Neuromuscular disease (myasthenia gravis)    GI/Hepatic GERD  ,(+) Cirrhosis   Esophageal Varices    ,   Endo/Other  diabetes, Type 2  Renal/GU Renal disease (nephrolithiasis, CKD)     Musculoskeletal  (+) Arthritis , Osteoarthritis,    Abdominal   Peds  Hematology Melanoma   Anesthesia Other Findings BPH  From family medicine note 03/02/18:  Patient follow-up doing well with diabetes blood pressure cholesterol. Patient's been losing weight which is helped with the above.  No complaints from medications. Patient with new diagnosis of myasthenia gravis and still in the work-up stage is getting some effective treatment now but that is contributed to his weight loss. Other medical problems have remained stable.  Reproductive/Obstetrics                            Anesthesia Physical Anesthesia Plan  ASA: IV  Anesthesia Plan: General   Post-op Pain Management:    Induction: Intravenous  PONV Risk Score and Plan: 2 and Propofol infusion and Treatment may vary due to age or medical condition  Airway Management Planned: Natural Airway  Additional Equipment:   Intra-op Plan:   Post-operative Plan:   Informed Consent: I have reviewed the patients History and Physical, chart, labs and discussed the  procedure including the risks, benefits and alternatives for the proposed anesthesia with the patient or authorized representative who has indicated his/her understanding and acceptance.     Plan Discussed with: CRNA  Anesthesia Plan Comments:         Anesthesia Quick Evaluation

## 2018-06-28 DIAGNOSIS — I950 Idiopathic hypotension: Secondary | ICD-10-CM | POA: Diagnosis not present

## 2018-06-28 DIAGNOSIS — G7001 Myasthenia gravis with (acute) exacerbation: Secondary | ICD-10-CM | POA: Diagnosis not present

## 2018-06-28 DIAGNOSIS — G7000 Myasthenia gravis without (acute) exacerbation: Secondary | ICD-10-CM | POA: Diagnosis not present

## 2018-06-29 NOTE — Discharge Instructions (Signed)
General Anesthesia, Adult, Care After °These instructions provide you with information about caring for yourself after your procedure. Your health care provider may also give you more specific instructions. Your treatment has been planned according to current medical practices, but problems sometimes occur. Call your health care provider if you have any problems or questions after your procedure. °What can I expect after the procedure? °After the procedure, it is common to have: °· Vomiting. °· A sore throat. °· Mental slowness. ° °It is common to feel: °· Nauseous. °· Cold or shivery. °· Sleepy. °· Tired. °· Sore or achy, even in parts of your body where you did not have surgery. ° °Follow these instructions at home: °For at least 24 hours after the procedure: °· Do not: °? Participate in activities where you could fall or become injured. °? Drive. °? Use heavy machinery. °? Drink alcohol. °? Take sleeping pills or medicines that cause drowsiness. °? Make important decisions or sign legal documents. °? Take care of children on your own. °· Rest. °Eating and drinking °· If you vomit, drink water, juice, or soup when you can drink without vomiting. °· Drink enough fluid to keep your urine clear or pale yellow. °· Make sure you have little or no nausea before eating solid foods. °· Follow the diet recommended by your health care provider. °General instructions °· Have a responsible adult stay with you until you are awake and alert. °· Return to your normal activities as told by your health care provider. Ask your health care provider what activities are safe for you. °· Take over-the-counter and prescription medicines only as told by your health care provider. °· If you smoke, do not smoke without supervision. °· Keep all follow-up visits as told by your health care provider. This is important. °Contact a health care provider if: °· You continue to have nausea or vomiting at home, and medicines are not helpful. °· You  cannot drink fluids or start eating again. °· You cannot urinate after 8-12 hours. °· You develop a skin rash. °· You have fever. °· You have increasing redness at the site of your procedure. °Get help right away if: °· You have difficulty breathing. °· You have chest pain. °· You have unexpected bleeding. °· You feel that you are having a life-threatening or urgent problem. °This information is not intended to replace advice given to you by your health care provider. Make sure you discuss any questions you have with your health care provider. °Document Released: 01/31/2001 Document Revised: 03/29/2016 Document Reviewed: 10/09/2015 °Elsevier Interactive Patient Education © 2018 [employer] Inc[employer] ° °

## 2018-07-03 ENCOUNTER — Ambulatory Visit
Admission: RE | Admit: 2018-07-03 | Discharge: 2018-07-03 | Disposition: A | Discharge status: Home / Self Care | Payer: Medicare Other | Source: Ambulatory Visit | Attending: Gastroenterology | Admitting: Gastroenterology

## 2018-07-03 ENCOUNTER — Encounter
Admission: RE | Disposition: A | Discharge status: Home / Self Care | Payer: Self-pay | Source: Ambulatory Visit | Attending: Gastroenterology

## 2018-07-03 ENCOUNTER — Ambulatory Visit: Payer: Medicare Other | Admitting: Anesthesiology

## 2018-07-03 DIAGNOSIS — N400 Enlarged prostate without lower urinary tract symptoms: Secondary | ICD-10-CM | POA: Diagnosis not present

## 2018-07-03 DIAGNOSIS — D124 Benign neoplasm of descending colon: Secondary | ICD-10-CM | POA: Diagnosis not present

## 2018-07-03 DIAGNOSIS — M1990 Unspecified osteoarthritis, unspecified site: Secondary | ICD-10-CM | POA: Diagnosis not present

## 2018-07-03 DIAGNOSIS — Z9989 Dependence on other enabling machines and devices: Secondary | ICD-10-CM | POA: Diagnosis not present

## 2018-07-03 DIAGNOSIS — G4730 Sleep apnea, unspecified: Secondary | ICD-10-CM | POA: Diagnosis not present

## 2018-07-03 DIAGNOSIS — I868 Varicose veins of other specified sites: Secondary | ICD-10-CM | POA: Insufficient documentation

## 2018-07-03 DIAGNOSIS — E785 Hyperlipidemia, unspecified: Secondary | ICD-10-CM | POA: Insufficient documentation

## 2018-07-03 DIAGNOSIS — D500 Iron deficiency anemia secondary to blood loss (chronic): Secondary | ICD-10-CM | POA: Diagnosis not present

## 2018-07-03 DIAGNOSIS — K2970 Gastritis, unspecified, without bleeding: Secondary | ICD-10-CM

## 2018-07-03 DIAGNOSIS — G7000 Myasthenia gravis without (acute) exacerbation: Secondary | ICD-10-CM | POA: Diagnosis not present

## 2018-07-03 DIAGNOSIS — K641 Second degree hemorrhoids: Secondary | ICD-10-CM | POA: Insufficient documentation

## 2018-07-03 DIAGNOSIS — K766 Portal hypertension: Secondary | ICD-10-CM | POA: Diagnosis not present

## 2018-07-03 DIAGNOSIS — D122 Benign neoplasm of ascending colon: Secondary | ICD-10-CM

## 2018-07-03 DIAGNOSIS — D509 Iron deficiency anemia, unspecified: Secondary | ICD-10-CM | POA: Diagnosis not present

## 2018-07-03 DIAGNOSIS — I10 Essential (primary) hypertension: Secondary | ICD-10-CM | POA: Insufficient documentation

## 2018-07-03 DIAGNOSIS — Z7984 Long term (current) use of oral hypoglycemic drugs: Secondary | ICD-10-CM | POA: Insufficient documentation

## 2018-07-03 DIAGNOSIS — E119 Type 2 diabetes mellitus without complications: Secondary | ICD-10-CM | POA: Insufficient documentation

## 2018-07-03 DIAGNOSIS — D508 Other iron deficiency anemias: Secondary | ICD-10-CM | POA: Diagnosis not present

## 2018-07-03 DIAGNOSIS — K3189 Other diseases of stomach and duodenum: Secondary | ICD-10-CM | POA: Diagnosis not present

## 2018-07-03 DIAGNOSIS — K219 Gastro-esophageal reflux disease without esophagitis: Secondary | ICD-10-CM | POA: Diagnosis not present

## 2018-07-03 DIAGNOSIS — Z87891 Personal history of nicotine dependence: Secondary | ICD-10-CM | POA: Insufficient documentation

## 2018-07-03 DIAGNOSIS — I2510 Atherosclerotic heart disease of native coronary artery without angina pectoris: Secondary | ICD-10-CM | POA: Diagnosis not present

## 2018-07-03 DIAGNOSIS — R011 Cardiac murmur, unspecified: Secondary | ICD-10-CM | POA: Insufficient documentation

## 2018-07-03 DIAGNOSIS — I8510 Secondary esophageal varices without bleeding: Secondary | ICD-10-CM | POA: Diagnosis not present

## 2018-07-03 DIAGNOSIS — Z8249 Family history of ischemic heart disease and other diseases of the circulatory system: Secondary | ICD-10-CM | POA: Insufficient documentation

## 2018-07-03 DIAGNOSIS — K7460 Unspecified cirrhosis of liver: Secondary | ICD-10-CM | POA: Insufficient documentation

## 2018-07-03 DIAGNOSIS — Z955 Presence of coronary angioplasty implant and graft: Secondary | ICD-10-CM | POA: Diagnosis not present

## 2018-07-03 DIAGNOSIS — Z79899 Other long term (current) drug therapy: Secondary | ICD-10-CM | POA: Insufficient documentation

## 2018-07-03 DIAGNOSIS — Z85820 Personal history of malignant melanoma of skin: Secondary | ICD-10-CM | POA: Insufficient documentation

## 2018-07-03 DIAGNOSIS — Z888 Allergy status to other drugs, medicaments and biological substances status: Secondary | ICD-10-CM | POA: Insufficient documentation

## 2018-07-03 HISTORY — PX: POLYPECTOMY: SHX5525

## 2018-07-03 HISTORY — PX: COLONOSCOPY WITH PROPOFOL: SHX5780

## 2018-07-03 HISTORY — DX: Unspecified cirrhosis of liver: K74.60

## 2018-07-03 HISTORY — PX: ESOPHAGOGASTRODUODENOSCOPY (EGD) WITH PROPOFOL: SHX5813

## 2018-07-03 LAB — GLUCOSE, CAPILLARY
Glucose-Capillary: 141 mg/dL — ABNORMAL HIGH (ref 70–99)
Glucose-Capillary: 169 mg/dL — ABNORMAL HIGH (ref 70–99)

## 2018-07-03 SURGERY — COLONOSCOPY WITH PROPOFOL
Anesthesia: General | Site: Rectum | Wound class: Dirty or Infected

## 2018-07-03 MED ORDER — LACTATED RINGERS IV SOLN
INTRAVENOUS | Status: DC
  Administered 2018-07-03: 08:00:00 via INTRAVENOUS

## 2018-07-03 MED ORDER — GLYCOPYRROLATE 0.2 MG/ML IJ SOLN
INTRAMUSCULAR | Status: DC | PRN
  Administered 2018-07-03: 0.1 mg via INTRAVENOUS

## 2018-07-03 MED ORDER — STERILE WATER FOR IRRIGATION IR SOLN
Status: DC | PRN
  Administered 2018-07-03: 09:00:00

## 2018-07-03 MED ORDER — ACETAMINOPHEN 325 MG PO TABS: 325.0000 mg | ORAL_TABLET | Freq: Once | ORAL | Status: DC

## 2018-07-03 MED ORDER — LIDOCAINE HCL (CARDIAC) PF 100 MG/5ML IV SOSY
PREFILLED_SYRINGE | INTRAVENOUS | Status: DC | PRN
  Administered 2018-07-03: 30 mg via INTRAVENOUS

## 2018-07-03 MED ORDER — SODIUM CHLORIDE 0.9 % IV SOLN: INTRAVENOUS | Status: DC

## 2018-07-03 MED ORDER — ACETAMINOPHEN 160 MG/5ML PO SOLN: 325.0000 mg | Freq: Once | ORAL | Status: DC

## 2018-07-03 MED ORDER — PROPOFOL 10 MG/ML IV BOLUS
INTRAVENOUS | Status: DC | PRN
  Administered 2018-07-03: 30 mg via INTRAVENOUS
  Administered 2018-07-03: 20 mg via INTRAVENOUS
  Administered 2018-07-03: 40 mg via INTRAVENOUS
  Administered 2018-07-03 (×4): 30 mg via INTRAVENOUS
  Administered 2018-07-03: 100 mg via INTRAVENOUS

## 2018-07-03 SURGICAL SUPPLY — 36 items
BALLN DILATOR 10-12 8 (BALLOONS)
BALLN DILATOR 12-15 8 (BALLOONS)
BALLN DILATOR 15-18 8 (BALLOONS)
BALLN DILATOR CRE 0-12 8 (BALLOONS)
BALLN DILATOR ESOPH 8 10 CRE (MISCELLANEOUS) IMPLANT
BALLOON DILATOR 12-15 8 (BALLOONS) IMPLANT
BALLOON DILATOR 15-18 8 (BALLOONS) IMPLANT
BALLOON DILATOR CRE 0-12 8 (BALLOONS) IMPLANT
BLOCK BITE 60FR ADLT L/F GRN (MISCELLANEOUS) ×4 IMPLANT
CANISTER SUCT 1200ML W/VALVE (MISCELLANEOUS) ×4 IMPLANT
CLIP HMST 235XBRD CATH ROT (MISCELLANEOUS) ×4 IMPLANT
CLIP RESOLUTION 360 11X235 (MISCELLANEOUS) ×4
ELECT REM PT RETURN 9FT ADLT (ELECTROSURGICAL)
ELECTRODE REM PT RTRN 9FT ADLT (ELECTROSURGICAL) IMPLANT
FCP ESCP3.2XJMB 240X2.8X (MISCELLANEOUS)
FORCEPS BIOP RAD 4 LRG CAP 4 (CUTTING FORCEPS) ×4 IMPLANT
FORCEPS BIOP RJ4 240 W/NDL (MISCELLANEOUS)
FORCEPS ESCP3.2XJMB 240X2.8X (MISCELLANEOUS) IMPLANT
GOWN CVR UNV OPN BCK APRN NK (MISCELLANEOUS) ×4 IMPLANT
GOWN ISOL THUMB LOOP REG UNIV (MISCELLANEOUS) ×4
INJECTOR VARIJECT VIN23 (MISCELLANEOUS) IMPLANT
KIT DEFENDO VALVE AND CONN (KITS) IMPLANT
KIT ENDO PROCEDURE OLY (KITS) ×4 IMPLANT
MARKER SPOT ENDO TATTOO 5ML (MISCELLANEOUS) IMPLANT
PROBE APC STR FIRE (PROBE) IMPLANT
RETRIEVER NET PLAT FOOD (MISCELLANEOUS) IMPLANT
RETRIEVER NET ROTH 2.5X230 LF (MISCELLANEOUS) IMPLANT
SNARE SHORT THROW 13M SML OVAL (MISCELLANEOUS) ×4 IMPLANT
SNARE SHORT THROW 30M LRG OVAL (MISCELLANEOUS) IMPLANT
SNARE SNG USE RND 15MM (INSTRUMENTS) IMPLANT
SPOT EX ENDOSCOPIC TATTOO (MISCELLANEOUS)
SYR INFLATION 60ML (SYRINGE) IMPLANT
TRAP ETRAP POLY (MISCELLANEOUS) ×4 IMPLANT
VARIJECT INJECTOR VIN23 (MISCELLANEOUS)
WATER STERILE IRR 250ML POUR (IV SOLUTION) ×4 IMPLANT
WIRE CRE 18-20MM 8CM F G (MISCELLANEOUS) IMPLANT

## 2018-07-03 NOTE — Anesthesia Postprocedure Evaluation (Signed)
Anesthesia Post Note  Patient: Mason Wilson.  Procedure(s) Performed: COLONOSCOPY WITH PROPOFOL (N/A Rectum) ESOPHAGOGASTRODUODENOSCOPY (EGD) WITH PROPOFOL (N/A Esophagus) POLYPECTOMY (N/A Rectum)  Patient location during evaluation: PACU Anesthesia Type: General Level of consciousness: awake and alert and oriented Pain management: satisfactory to patient Vital Signs Assessment: post-procedure vital signs reviewed and stable Respiratory status: spontaneous breathing, nonlabored ventilation and respiratory function stable Cardiovascular status: blood pressure returned to baseline and stable Postop Assessment: Adequate PO intake and No signs of nausea or vomiting Anesthetic complications: no    Raliegh Ip

## 2018-07-03 NOTE — Op Note (Signed)
[facility] Patient Name: Mason Wilson Procedure Date: 07/03/2018 8:17 AM MRN: [id number] Account #: [id number] Date of Birth: 1941/10/15 Admit Type: Outpatient Age: 77 Room: [facility] OR ROOM 01 Gender: Male Note Status: Finalized Procedure:            Upper GI endoscopy Indications:          Iron deficiency anemia Providers:            Lucilla Lame MD, MD Referring MD:         [name], MD (Referring MD) Medicines:            Propofol per Anesthesia Complications:        No immediate complications. Procedure:            Pre-Anesthesia Assessment:                       - Prior to the procedure, a History and Physical was                        performed, and patient medications and allergies were                        reviewed. The patient's tolerance of previous                        anesthesia was also reviewed. The risks and benefits of                        the procedure and the sedation options and risks were                        discussed with the patient. All questions were                        answered, and informed consent was obtained. Prior                        Anticoagulants: The patient has taken no previous                        anticoagulant or antiplatelet agents. ASA Grade                        Assessment: II - A patient with mild systemic disease.                        After reviewing the risks and benefits, the patient was                        deemed in satisfactory condition to undergo the                        procedure.                       After obtaining informed consent, the endoscope was                        passed under direct vision. Throughout the procedure,  the patient's blood pressure, pulse, and oxygen                        saturations were monitored continuously. The was                        introduced through the mouth, and advanced to the   second part of duodenum. The upper GI endoscopy was                        accomplished without difficulty. The patient tolerated                        the procedure well. Findings:      Grade I varices were found in the lower third of the esophagus.      Diffuse moderate inflammation characterized by erythema was found in the       gastric antrum.      Mild portal hypertensive gastropathy was found in the entire examined       stomach.      The examined duodenum was normal. Impression:           - Grade I esophageal varices.                       - Gastritis.                       - Portal hypertensive gastropathy.                       - Normal examined duodenum.                       - No specimens collected. Recommendation:       - Perform a colonoscopy today. Procedure Code(s):    --- Professional ---                       [phone], Esophagogastroduodenoscopy, flexible, transoral;                        diagnostic, including collection of specimen(s) by                        brushing or washing, when performed (separate procedure) Diagnosis Code(s):    --- Professional ---                       D50.9, Iron deficiency anemia, unspecified                       K76.6, Portal hypertension                       K29.70, Gastritis, unspecified, without bleeding                       I85.00, Esophageal varices without bleeding CPT copyright 2017 [employer] Medical Association. All rights reserved. The codes documented in this report are preliminary and upon coder review may  be revised to meet current compliance requirements. Lucilla Lame MD, MD 07/03/2018 8:37:28 AM This report has been signed electronically. Number of Addenda: 0 Note Initiated On: 07/03/2018 8:17 AM Total Procedure Duration: 0  hours 1 minute 47 seconds       [facility]

## 2018-07-03 NOTE — Transfer of Care (Signed)
Immediate Anesthesia Transfer of Care Note  Patient: Mason Wilson.  Procedure(s) Performed: COLONOSCOPY WITH PROPOFOL (N/A Rectum) ESOPHAGOGASTRODUODENOSCOPY (EGD) WITH PROPOFOL (N/A Esophagus) POLYPECTOMY (N/A Rectum)  Patient Location: PACU  Anesthesia Type: General  Level of Consciousness: awake, alert  and patient cooperative  Airway and Oxygen Therapy: Patient Spontanous Breathing and Patient connected to supplemental oxygen  Post-op Assessment: Post-op Vital signs reviewed, Patient's Cardiovascular Status Stable, Respiratory Function Stable, Patent Airway and No signs of Nausea or vomiting  Post-op Vital Signs: Reviewed and stable  Complications: No apparent anesthesia complications

## 2018-07-03 NOTE — H&P (Signed)
Lucilla Lame, MD [address] [place], [place] [address] Phone:[phone] Fax : [phone]  Primary Care Physician:  [name], MD Primary Gastroenterologist:  Dr. Polanco Norris  Pre-Procedure History & Physical: HPI:  Mason Wilson. is a 77 y.o. male is here for an endoscopy and colonoscopy.   Past Medical History:  Diagnosis Date  . Allergy   . Arthritis    "everywhere' - worse in back  . Body mass index 39.0-39.9, adult   . CAD (coronary artery disease)   . Chronic back pain   . Cirrhosis, non-alcoholic ([place])   . Diabetes mellitus without complication ([place])   . Elevated liver enzymes   . GERD (gastroesophageal reflux disease)   . Heart murmur   . Hyperlipidemia   . Hypertension   . Hypogonadism in male   . Melanoma ([place])    melanoma   . Melanoma in situ of right shoulder ([place])   . Myasthenia gravis ([place]) 01/2018  . Renal stones   . Sleep apnea    CPAP  . Uses hearing aid    doesn't wear    Past Surgical History:  Procedure Laterality Date  . ANGIOPLASTY  1999   3 stents  . CARDIAC CATHETERIZATION     stents placed  . CARPAL TUNNEL RELEASE     x2  . CATARACT EXTRACTION, BILATERAL    . ESOPHAGOGASTRODUODENOSCOPY (EGD) WITH PROPOFOL N/A 09/08/2015   Procedure: ESOPHAGOGASTRODUODENOSCOPY (EGD) WITH PROPOFOL;  Surgeon: Lucilla Lame, MD;  Location: [facility];  Service: Endoscopy;  Laterality: N/A;  CPAP Diabetic - oral meds  . LIVER BIOPSY    . SKIN LESION EXCISION  1998  . SKIN LESION EXCISION     skin cancer removed on forehead in June 2018  . TONSILLECTOMY     age 5    Prior to Admission medications   Medication Sig Start Date End Date Taking? Authorizing Provider  Calcium Carbonate-Vit D-Min (CALCIUM 1200 PO) Take 1,200 mg by mouth.   Yes [provider]  DIALYVITE VITAMIN D3 MAX 02774 units TABS TK 1 T PO 1 TIME A WK FOR 8 WKS 01/12/18  Yes [provider]  Ferrous Fumarate (IRON) 18 MG TBCR Take 18 mg by  mouth.   Yes [provider]  glipiZIDE (GLUCOTROL) 5 MG tablet Take 1 tablet (5 mg total) by mouth daily before breakfast. 09/01/17  Yes Crissman, Jeannette How, MD  hydrochlorothiazide (HYDRODIURIL) 25 MG tablet Take 1 tablet (25 mg total) by mouth daily. 09/01/17  Yes Crissman, Jeannette How, MD  losartan (COZAAR) 100 MG tablet Take 1 tablet (100 mg total) by mouth daily. 09/01/17  Yes Crissman, Jeannette How, MD  metFORMIN (GLUCOPHAGE) 500 MG tablet TAKE 2 TABLETS(1000 MG) BY MOUTH TWICE DAILY WITH A MEAL 02/17/18  Yes Crissman, Jeannette How, MD  metoprolol succinate (TOPROL-XL) 50 MG 24 hr tablet Take 1 tablet (50 mg total) by mouth daily. Take with or immediately following a meal. 09/01/17  Yes Crissman, Jeannette How, MD  omeprazole (PRILOSEC) 20 MG capsule Take 20 mg by mouth daily. AM   Yes [provider]  predniSONE (DELTASONE) 20 MG tablet Take by mouth. 04/17/18  Yes [provider]  pyridostigmine (MESTINON) 60 MG tablet Take 60 mg by mouth 4 (four) times daily.   Yes [provider]  simvastatin (ZOCOR) 40 MG tablet Take 1 tablet (40 mg total) by mouth daily at 6 PM. 09/01/17  Yes Crissman, Jeannette How, MD  tamsulosin (  FLOMAX) 0.4 MG CAPS capsule TAKE 1 CAPSULE(0.4 MG) BY MOUTH DAILY 08/02/17  Yes McGowan, Larene Beach A, PA-C  clobetasol cream (TEMOVATE) 0.05 % As needed 02/11/16   [provider]  tiZANidine (ZANAFLEX) 4 MG tablet Take 4 mg by mouth every 6 (six) hours as needed for muscle spasms.    [provider]  traMADol (ULTRAM) 50 MG tablet Take 1 tablet (50 mg total) by mouth every 6 (six) hours as needed. Patient not taking: Reported on 06/26/2018 09/01/17   [name], MD    Allergies as of 06/08/2018 - Review Complete 05/31/2018  Allergen Reaction Noted  . Altace [ramipril] Cough 04/03/2015    Family History  Problem Relation Age of Onset  . Thyroid disease Mother   . Alzheimer's disease Mother   . Heart disease Mother   . Cancer Father 30        testicular  . Heart disease Father   . Hypertension Father   . Heart attack Father   . Kidney disease Neg Hx   . Prostate cancer Neg Hx   . Kidney cancer Neg Hx   . Bladder Cancer Neg Hx     Social History   Socioeconomic History  . Marital status: Married    Spouse name: Not on file  . Number of children: Not on file  . Years of education: Not on file  . Highest education level: Not on file  Occupational History  . Not on file  Social Needs  . Financial resource strain: Not hard at all  . Food insecurity:    Worry: Never true    Inability: Never true  . Transportation needs:    Medical: No    Non-medical: No  Tobacco Use  . Smoking status: Former Smoker    Types: Cigarettes    Last attempt to quit: 04/02/1970    Years since quitting: 48.2  . Smokeless tobacco: Current User    Types: Chew  . Tobacco comment: approx quit in 1971   Substance and Sexual Activity  . Alcohol use: No    Alcohol/week: 0.0 standard drinks    Comment: stopped in 1996  . Drug use: No  . Sexual activity: Yes    Partners: Female  Lifestyle  . Physical activity:    Days per week: 0 days    Minutes per session: 0 min  . Stress: Not at all  Relationships  . Social connections:    Talks on phone: More than three times a week    Gets together: Once a week    Attends religious service: Never    Active member of club or organization: No    Attends meetings of clubs or organizations: Never    Relationship status: Married  . Intimate partner violence:    Fear of current or ex partner: No    Emotionally abused: No    Physically abused: No    Forced sexual activity: No  Other Topics Concern  . Not on file  Social History Narrative  . Not on file    Review of Systems: See HPI, otherwise negative ROS  Physical Exam: BP (!) 147/70   Pulse 86   Temp 97.9 F (36.6 C) (Temporal)   Resp 17   Ht 5\' 10"  (1.778 m)   Wt 103 kg   SpO2 99%   BMI 32.57 kg/m  General:   Alert,  pleasant and  cooperative in NAD Head:  Normocephalic and atraumatic. Neck:  Supple; no masses or  thyromegaly. Lungs:  Clear throughout to auscultation.    Heart:  Regular rate and rhythm. Abdomen:  Soft, nontender and nondistended. Normal bowel sounds, without guarding, and without rebound.   Neurologic:  Alert and  oriented x4;  grossly normal neurologically.  Impression/Plan: Mason Wilson. is here for an endoscopy and colonoscopy to be performed for IDA  Risks, benefits, limitations, and alternatives regarding  endoscopy and colonoscopy have been reviewed with the patient.  Questions have been answered.  All parties agreeable.   Lucilla Lame, MD  07/03/2018, 7:39 AM

## 2018-07-03 NOTE — Op Note (Signed)
[facility] Gastroenterology Patient Name: Mason Wilson Procedure Date: 07/03/2018 8:17 AM MRN: [id number] Account #: [id number] Date of Birth: January 25, 1941 Admit Type: Outpatient Age: 77 Room: [facility] OR ROOM 01 Gender: Male Note Status: Finalized Procedure:            Colonoscopy Indications:          Iron deficiency anemia Providers:            Lucilla Lame MD, MD Referring MD:         [name], MD (Referring MD) Medicines:            Propofol per Anesthesia Complications:        No immediate complications. Procedure:            Pre-Anesthesia Assessment:                       - Prior to the procedure, a History and Physical was                        performed, and patient medications and allergies were                        reviewed. The patient's tolerance of previous                        anesthesia was also reviewed. The risks and benefits of                        the procedure and the sedation options and risks were                        discussed with the patient. All questions were                        answered, and informed consent was obtained. Prior                        Anticoagulants: The patient has taken no previous                        anticoagulant or antiplatelet agents. ASA Grade                        Assessment: II - A patient with mild systemic disease.                        After reviewing the risks and benefits, the patient was                        deemed in satisfactory condition to undergo the                        procedure.                       After obtaining informed consent, the colonoscope was                        passed under direct vision. Throughout the procedure,  the patient's blood pressure, pulse, and oxygen                        saturations were monitored continuously. The was                        introduced through the anus and advanced to the the                        cecum,  identified by appendiceal orifice and ileocecal                        valve. The colonoscopy was performed without                        difficulty. The patient tolerated the procedure well.                        The quality of the bowel preparation was excellent. Findings:      The perianal and digital rectal examinations were normal.      A 8 mm polyp was found in the ascending colon. The polyp was       pedunculated. The polyp was removed with a cold snare. Resection and       retrieval were complete. To prevent bleeding post-intervention, one       hemostatic clip was successfully placed (MR conditional). There was no       bleeding at the end of the procedure.      Two sessile polyps were found in the ascending colon. The polyps were 2       to 3 mm in size. These polyps were removed with a cold biopsy forceps.       Resection and retrieval were complete.      A 6 mm polyp was found in the descending colon. The polyp was sessile.       The polyp was removed with a cold snare. Resection and retrieval were       complete. To prevent bleeding post-intervention, one hemostatic clip was       successfully placed (MR conditional). There was no bleeding at the end       of the procedure.      A 3 mm polyp was found in the descending colon. The polyp was sessile.       The polyp was removed with a cold snare. Resection and retrieval were       complete.      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      Non-bleeding internal hemorrhoids were found during retroflexion. The       hemorrhoids were Grade II (internal hemorrhoids that prolapse but reduce       spontaneously). Impression:           - One 8 mm polyp in the ascending colon, removed with a                        cold snare. Resected and retrieved. Clip (MR                        conditional) was placed.                       -  Two 2 to 3 mm polyps in the ascending colon, removed                        with a cold biopsy forceps.  Resected and retrieved.                       - One 6 mm polyp in the descending colon, removed with                        a cold snare. Resected and retrieved. Clip (MR                        conditional) was placed.                       - One 3 mm polyp in the descending colon, removed with                        a cold snare. Resected and retrieved.                       - Rectal varices.                       - Non-bleeding internal hemorrhoids. Recommendation:       - Discharge patient to home.                       - Resume previous diet.                       - Continue present medications.                       - Await pathology results.                       - Return to my office in 3 weeks. Procedure Code(s):    --- Professional ---                       [phone], Colonoscopy, flexible; with removal of tumor(s),                        polyp(s), or other lesion(s) by snare technique                       45380, 43, Colonoscopy, flexible; with biopsy, single                        or multiple Diagnosis Code(s):    --- Professional ---                       D50.9, Iron deficiency anemia, unspecified                       D12.2, Benign neoplasm of ascending colon                       D12.4, Benign neoplasm of descending colon CPT copyright 2017 American Medical Association. All rights reserved. The codes documented in this report are preliminary and upon coder review may  be revised to meet current compliance requirements. Lucilla Lame MD, MD 07/03/2018 8:58:28 AM This report has been signed electronically. Number of Addenda: 0 Note Initiated On: 07/03/2018 8:17 AM Scope Withdrawal Time: 0 hours 11 minutes 23 seconds  Total Procedure Duration: 0 hours 13 minutes 5 seconds       [facility]

## 2018-07-03 NOTE — Anesthesia Procedure Notes (Signed)
Date/Time: 07/03/2018 8:33 AM Performed by: Cameron Ali, CRNA Pre-anesthesia Checklist: Patient identified, Emergency Drugs available, Suction available, Timeout performed and Patient being monitored Patient Re-evaluated:Patient Re-evaluated prior to induction Oxygen Delivery Method: Nasal cannula Placement Confirmation: positive ETCO2

## 2018-07-04 ENCOUNTER — Encounter: Payer: Self-pay | Admitting: Gastroenterology

## 2018-07-05 DIAGNOSIS — D61818 Other pancytopenia: Secondary | ICD-10-CM | POA: Diagnosis not present

## 2018-07-05 DIAGNOSIS — Z8639 Personal history of other endocrine, nutritional and metabolic disease: Secondary | ICD-10-CM | POA: Diagnosis not present

## 2018-07-05 DIAGNOSIS — D509 Iron deficiency anemia, unspecified: Secondary | ICD-10-CM | POA: Diagnosis not present

## 2018-07-31 ENCOUNTER — Ambulatory Visit (INDEPENDENT_AMBULATORY_CARE_PROVIDER_SITE_OTHER): Payer: Medicare Other | Admitting: Gastroenterology

## 2018-07-31 ENCOUNTER — Encounter: Payer: Self-pay | Admitting: Gastroenterology

## 2018-07-31 DIAGNOSIS — D500 Iron deficiency anemia secondary to blood loss (chronic): Secondary | ICD-10-CM

## 2018-07-31 NOTE — Progress Notes (Signed)
Primary Care Physician: [name], MD  Primary Gastroenterologist:  Dr. Lucilla Lame  Chief Complaint  Patient presents with  . Follow up procedures    HPI: Mason Wilson. is a 77 y.o. male here who is coming for follow-up after having an EGD and colonoscopy for iron deficiency anemia.  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  There is no source of his iron deficiency anemia seen from his procedures.  The polyps removed from the colon were adenomas.  The patient reports that he was getting iron infusions that were stopped when his iron levels came back to normal.  Current Outpatient Medications  Medication Sig Dispense Refill  . Calcium Carbonate-Vit D-Min (CALCIUM 1200 PO) Take 1,200 mg by mouth.    . clobetasol cream (TEMOVATE) 0.05 % As needed  1  . DIALYVITE VITAMIN D3 MAX 88416 units TABS TK 1 T PO 1 TIME A WK FOR 8 WKS  0  . Ferrous Fumarate (IRON) 18 MG TBCR Take 18 mg by mouth.    Marland Kitchen glipiZIDE (GLUCOTROL) 5 MG tablet Take 1 tablet (5 mg total) by mouth daily before breakfast. 90 tablet 4  . hydrochlorothiazide (HYDRODIURIL) 25 MG tablet Take 1 tablet (25 mg total) by mouth daily. 90 tablet 4  . losartan (COZAAR) 100 MG tablet Take 1 tablet (100 mg total) by mouth daily. 90 tablet 4  . metFORMIN (GLUCOPHAGE) 500 MG tablet TAKE 2 TABLETS(1000 MG) BY MOUTH TWICE DAILY WITH A MEAL 360 tablet 0  . metoprolol succinate (TOPROL-XL) 50 MG 24 hr tablet Take 1 tablet (50 mg total) by mouth daily. Take with or immediately following a meal. 90 tablet 4  . omeprazole (PRILOSEC) 20 MG capsule Take 20 mg by mouth daily. AM    . predniSONE (DELTASONE) 20 MG tablet Take by mouth.    . pyridostigmine (MESTINON) 60 MG tablet Take 60 mg by mouth 4 (four) times daily.    . simvastatin (ZOCOR) 40 MG tablet Take 1 tablet (40 mg total) by mouth daily at 6 PM. 90 tablet 4  . tamsulosin (FLOMAX) 0.4 MG CAPS capsule TAKE 1 CAPSULE(0.4 MG)  BY MOUTH DAILY 90 capsule 3  . tiZANidine (ZANAFLEX) 4 MG tablet Take 4 mg by mouth every 6 (six) hours as needed for muscle spasms.    . traMADol (ULTRAM) 50 MG tablet Take 1 tablet (50 mg total) by mouth every 6 (six) hours as needed. (Patient not taking: Reported on 07/31/2018) 120 tablet 0   No current facility-administered medications for this visit.     Allergies as of 07/31/2018 - Review Complete 07/31/2018  Allergen Reaction Noted  . Altace [ramipril] Cough 04/03/2015    ROS:  General: Negative for anorexia, weight loss, fever, chills, fatigue, weakness. ENT: Negative for hoarseness, difficulty swallowing , nasal congestion. CV: Negative for chest pain, angina, palpitations, dyspnea on exertion, peripheral edema.  Respiratory: Negative for dyspnea at rest, dyspnea on exertion, cough, sputum, wheezing.  GI: See history of present illness. GU:  Negative for dysuria, hematuria, urinary incontinence, urinary frequency, nocturnal urination.  Endo: Negative for unusual weight change.    Physical Examination:   There were no vitals taken for this visit.  General: Well-nourished, well-developed in no acute distress.  Eyes: No icterus. Conjunctivae pink. Mouth: Oropharyngeal mucosa moist and pink , no lesions erythema or exudate. Lungs: Clear to auscultation bilaterally. Non-labored. Heart: Regular rate and rhythm, no murmurs rubs or  gallops.  Abdomen: Bowel sounds are normal, nontender, nondistended, no hepatosplenomegaly or masses, no abdominal bruits or hernia , no rebound or guarding.   Extremities: No lower extremity edema. No clubbing or deformities. Neuro: Alert and oriented x 3.  Grossly intact. Skin: Warm and dry, no jaundice.   Psych: Alert and cooperative, normal mood and affect.  Labs:    Imaging Studies: No results found.  Assessment and Plan:   Mason Wilson. is a 77 y.o. y/o male who had an EGD and colonoscopy done for iron deficiency anemia.  There is no  source of the iron deficiency anemia seen during his procedures.  The patient will have his labs checked for iron studies and a CBC.  If his labs show that he is still anemic with low iron indices then he will be set up for a capsule endoscopy.  The patient has been explained the plan and agrees with it.    Lucilla Lame, MD. [facility] Regal   Note: This dictation was prepared with Dragon dictation along with smaller phrase technology. Any transcriptional errors that result from this process are unintentional.

## 2018-08-01 LAB — CBC WITH DIFFERENTIAL/PLATELET
BASOS ABS: 0 10*3/uL (ref 0.0–0.2)
Basos: 0 %
EOS (ABSOLUTE): 0 10*3/uL (ref 0.0–0.4)
EOS: 1 %
HEMOGLOBIN: 10.6 g/dL — AB (ref 13.0–17.7)
Hematocrit: 30.6 % — ABNORMAL LOW (ref 37.5–51.0)
IMMATURE GRANULOCYTES: 1 %
Immature Grans (Abs): 0 10*3/uL (ref 0.0–0.1)
Lymphocytes Absolute: 0.5 10*3/uL — ABNORMAL LOW (ref 0.7–3.1)
Lymphs: 11 %
MCH: 31.6 pg (ref 26.6–33.0)
MCHC: 34.6 g/dL (ref 31.5–35.7)
MCV: 91 fL (ref 79–97)
MONOCYTES: 7 %
Monocytes Absolute: 0.3 10*3/uL (ref 0.1–0.9)
NEUTROS PCT: 80 %
Neutrophils Absolute: 3.5 10*3/uL (ref 1.4–7.0)
PLATELETS: 64 10*3/uL — AB (ref 150–450)
RBC: 3.35 x10E6/uL — AB (ref 4.14–5.80)
RDW: 15.9 % — ABNORMAL HIGH (ref 12.3–15.4)
WBC: 4.4 10*3/uL (ref 3.4–10.8)

## 2018-08-01 LAB — IRON AND TIBC
IRON SATURATION: 20 % (ref 15–55)
Iron: 58 ug/dL (ref 38–169)
Total Iron Binding Capacity: 295 ug/dL (ref 250–450)
UIBC: 237 ug/dL (ref 111–343)

## 2018-08-01 LAB — FERRITIN: FERRITIN: 69 ng/mL (ref 30–400)

## 2018-08-03 ENCOUNTER — Telehealth: Payer: Self-pay

## 2018-08-03 NOTE — Telephone Encounter (Signed)
Pt notified of lab results

## 2018-08-03 NOTE — Telephone Encounter (Signed)
-----   Message from Lucilla Lame, MD sent at 08/03/2018  8:36 AM EDT ----- Let the patient know that his iron levels are now normal although his blood count is better it is not back to normal

## 2018-08-07 ENCOUNTER — Other Ambulatory Visit: Payer: Medicare Other

## 2018-08-07 ENCOUNTER — Other Ambulatory Visit: Payer: Self-pay

## 2018-08-07 DIAGNOSIS — N138 Other obstructive and reflux uropathy: Secondary | ICD-10-CM

## 2018-08-07 DIAGNOSIS — N401 Enlarged prostate with lower urinary tract symptoms: Secondary | ICD-10-CM

## 2018-08-07 DIAGNOSIS — N390 Urinary tract infection, site not specified: Secondary | ICD-10-CM

## 2018-08-08 LAB — PSA: Prostate Specific Ag, Serum: 2.1 ng/mL (ref 0.0–4.0)

## 2018-08-09 ENCOUNTER — Ambulatory Visit (INDEPENDENT_AMBULATORY_CARE_PROVIDER_SITE_OTHER): Payer: Medicare Other | Admitting: Urology

## 2018-08-09 ENCOUNTER — Encounter: Payer: Self-pay | Admitting: Urology

## 2018-08-09 VITALS — BP 128/70 | Ht 69.0 in | Wt 237.8 lb

## 2018-08-09 DIAGNOSIS — N401 Enlarged prostate with lower urinary tract symptoms: Secondary | ICD-10-CM

## 2018-08-09 DIAGNOSIS — N138 Other obstructive and reflux uropathy: Secondary | ICD-10-CM | POA: Diagnosis not present

## 2018-08-09 DIAGNOSIS — N200 Calculus of kidney: Secondary | ICD-10-CM

## 2018-08-09 DIAGNOSIS — Z87440 Personal history of urinary (tract) infections: Secondary | ICD-10-CM | POA: Diagnosis not present

## 2018-08-09 DIAGNOSIS — N529 Male erectile dysfunction, unspecified: Secondary | ICD-10-CM | POA: Diagnosis not present

## 2018-08-09 MED ORDER — TAMSULOSIN HCL 0.4 MG PO CAPS: ORAL_CAPSULE | ORAL | 3 refills | Status: AC

## 2018-08-09 NOTE — Progress Notes (Signed)
08/09/2018 10:13 AM   Mason Wilson. 24-Feb-1941 [id number]  Referring provider: [name], MD [address] [place], [place] [address]  Chief Complaint  Patient presents with  . Follow-up    HPI: Patient is a 77 year old Caucasian male with a history of UTI, BPH with LU TS, bilateral nephrolithiasis and ED who presents for follow up.    History of rUTI's Risk factors: age, Urine culture in 10/2017 positive for E.coli resistant to ampicillin.  BPH WITH LUTS  (prostate and/or bladder) IPSS score: 12/5    Previous score: 5/2   Previous PVR: 74 mL  Major complaint(s): Frequency, urgency and nocturia x several years. Denies any dysuria, hematuria or suprapubic pain.   Currently taking: tamsulosin 0.4 mg daily   Denies any recent fevers, chills, nausea or vomiting.   IPSS    Row Name 08/09/18 0900         International Prostate Symptom Score   How often have you had the sensation of not emptying your bladder?  Less than 1 in 5     How often have you had to urinate less than every two hours?  Less than half the time     How often have you found you stopped and started again several times when you urinated?  Not at All     How often have you found it difficult to postpone urination?  About half the time     How often have you had a weak urinary stream?  About half the time     How often have you had to strain to start urination?  Less than 1 in 5 times     How many times did you typically get up at night to urinate?  2 Times     Total IPSS Score  12       Quality of Life due to urinary symptoms   If you were to spend the rest of your life with your urinary condition just the way it is now how would you feel about that?  Unhappy        Score:  1-7 Mild 8-19 Moderate 20-35 Severe    Bilateral nephrolithiasis Tiny punctate stones bilaterally on CT in 2016.  He has not had any flank pain or passage of fragments.     Erectile dysfunction Trimix has not  been effective.  Not interested in sex at thia time.  Has no energy due to iron issues.    PMH: Past Medical History:  Diagnosis Date  . Allergy   . Arthritis    "everywhere' - worse in back  . Body mass index 39.0-39.9, adult   . CAD (coronary artery disease)   . Chronic back pain   . Cirrhosis, non-alcoholic ([place])   . Diabetes mellitus without complication ([place])   . Elevated liver enzymes   . GERD (gastroesophageal reflux disease)   . Heart murmur   . Hyperlipidemia   . Hypertension   . Hypogonadism in male   . Melanoma ([place])    melanoma   . Melanoma in situ of right shoulder ([place])   . Myasthenia gravis ([facility]) 01/2018  . Renal stones   . Sleep apnea    CPAP  . Uses hearing aid    doesn't wear    Surgical History: Past Surgical History:  Procedure Laterality Date  . ANGIOPLASTY  1999   3 stents  . CARDIAC CATHETERIZATION     stents placed  .  CARPAL TUNNEL RELEASE     x2  . CATARACT EXTRACTION, BILATERAL    . COLONOSCOPY WITH PROPOFOL N/A 07/03/2018   Procedure: COLONOSCOPY WITH PROPOFOL;  Surgeon: Lucilla Lame, MD;  Location: [facility];  Service: Endoscopy;  Laterality: N/A;  . ESOPHAGOGASTRODUODENOSCOPY (EGD) WITH PROPOFOL N/A 09/08/2015   Procedure: ESOPHAGOGASTRODUODENOSCOPY (EGD) WITH PROPOFOL;  Surgeon: Lucilla Lame, MD;  Location: [facility];  Service: Endoscopy;  Laterality: N/A;  CPAP Diabetic - oral meds  . ESOPHAGOGASTRODUODENOSCOPY (EGD) WITH PROPOFOL N/A 07/03/2018   Procedure: ESOPHAGOGASTRODUODENOSCOPY (EGD) WITH PROPOFOL;  Surgeon: Lucilla Lame, MD;  Location: [facility];  Service: Endoscopy;  Laterality: N/A;  diabetic - oral meds sleep apnea  . LIVER BIOPSY    . POLYPECTOMY N/A 07/03/2018   Procedure: POLYPECTOMY;  Surgeon: Lucilla Lame, MD;  Location: [facility];  Service: Endoscopy;  Laterality: N/A;  . SKIN LESION EXCISION  1998  . SKIN LESION EXCISION     skin cancer removed on forehead in June 2018  .  TONSILLECTOMY     age 32    Home Medications:  Allergies as of 08/09/2018      Reactions   Altace [ramipril] Cough      Medication List        Accurate as of 08/09/18 10:13 AM. Always use your most recent med list.          CALCIUM 1200 PO Take 1,200 mg by mouth.   DIALYVITE VITAMIN D3 MAX 66440 units Tabs Generic drug:  Cholecalciferol TK 1 T PO 1 TIME A WK FOR 8 WKS   glipiZIDE 5 MG tablet Commonly known as:  GLUCOTROL Take 1 tablet (5 mg total) by mouth daily before breakfast.   hydrochlorothiazide 25 MG tablet Commonly known as:  HYDRODIURIL Take 1 tablet (25 mg total) by mouth daily.   losartan 100 MG tablet Commonly known as:  COZAAR Take 1 tablet (100 mg total) by mouth daily.   metFORMIN 500 MG tablet Commonly known as:  GLUCOPHAGE TAKE 2 TABLETS(1000 MG) BY MOUTH TWICE DAILY WITH A MEAL   metoprolol succinate 50 MG 24 hr tablet Commonly known as:  TOPROL-XL Take 1 tablet (50 mg total) by mouth daily. Take with or immediately following a meal.   omeprazole 20 MG capsule Commonly known as:  PRILOSEC Take 20 mg by mouth daily. AM   predniSONE 20 MG tablet Commonly known as:  DELTASONE Take by mouth.   pyridostigmine 60 MG tablet Commonly known as:  MESTINON Take 60 mg by mouth 4 (four) times daily.   simvastatin 40 MG tablet Commonly known as:  ZOCOR Take 1 tablet (40 mg total) by mouth daily at 6 PM.   tamsulosin 0.4 MG Caps capsule Commonly known as:  FLOMAX TAKE 1 CAPSULE(0.4 MG) BY MOUTH DAILY   tiZANidine 4 MG tablet Commonly known as:  ZANAFLEX Take 4 mg by mouth every 6 (six) hours as needed for muscle spasms.   traMADol 50 MG tablet Commonly known as:  ULTRAM Take 1 tablet (50 mg total) by mouth every 6 (six) hours as needed.       Allergies:  Allergies  Allergen Reactions  . Altace [Ramipril] Cough    Family History: Family History  Problem Relation Age of Onset  . Thyroid disease Mother   . Alzheimer's disease Mother     . Heart disease Mother   . Cancer Father 44       testicular  . Heart disease Father   .  Hypertension Father   . Heart attack Father   . Kidney disease Neg Hx   . Prostate cancer Neg Hx   . Kidney cancer Neg Hx   . Bladder Cancer Neg Hx     Social History:  reports that he quit smoking about 48 years ago. His smoking use included cigarettes. His smokeless tobacco use includes chew. He reports that he does not drink alcohol or use drugs.  ROS: UROLOGY Frequent Urination?: Yes Hard to postpone urination?: Yes Burning/pain with urination?: No Get up at night to urinate?: Yes Leakage of urine?: No Urine stream starts and stops?: No Trouble starting stream?: No Do you have to strain to urinate?: No Blood in urine?: No Urinary tract infection?: No Sexually transmitted disease?: No Injury to kidneys or bladder?: No Painful intercourse?: No Weak stream?: No Erection problems?: No Penile pain?: No  Gastrointestinal Nausea?: No Vomiting?: No Indigestion/heartburn?: No Diarrhea?: No Constipation?: No  Constitutional Fever: No Night sweats?: No Weight loss?: No Fatigue?: No  Skin Skin rash/lesions?: No Itching?: No  Eyes Blurred vision?: No Double vision?: No  Ears/Nose/Throat Sore throat?: No Sinus problems?: No  Hematologic/Lymphatic Swollen glands?: No Easy bruising?: Yes  Cardiovascular Leg swelling?: No Chest pain?: No  Respiratory Cough?: No Shortness of breath?: No  Endocrine Excessive thirst?: No  Musculoskeletal Back pain?: Yes Joint pain?: No  Neurological Headaches?: No Dizziness?: No  Psychologic Depression?: No Anxiety?: No  Physical Exam: BP 128/70 (BP Location: Left Arm, Patient Position: Sitting, Cuff Size: Normal)   Ht 5\' 9"  (1.753 m)   Wt 237 lb 12.8 oz (107.9 kg)   BMI 35.12 kg/m   Constitutional: Well nourished. Alert and oriented, No acute distress. HEENT: [place] AT, moist mucus membranes. Trachea midline, no  masses. Cardiovascular: No clubbing, cyanosis, or edema. Respiratory: Normal respiratory effort, no increased work of breathing. GI: Abdomen is soft, non tender, non distended, no abdominal masses. Liver and spleen not palpable.  No hernias appreciated.  Stool sample for occult testing is not indicated.   GU: No CVA tenderness.  No bladder fullness or masses.  Patient with uncircumcised phallus. Foreskin easily retracted  Mild paraphimosis-reduced easily.  Urethral meatus is patent.  No penile discharge. No penile lesions or rashes. Scrotum without lesions, cysts, rashes and/or edema.  Testicles are located scrotally bilaterally. No masses are appreciated in the testicles. Left and right epididymis are normal. Rectal: Patient with  normal sphincter tone. Anus and perineum without scarring or rashes. No rectal masses are appreciated. Prostate is approximately 55 grams, no nodules are appreciated. Seminal vesicles are normal. Skin: No rashes, bruises or suspicious lesions. Lymph: No cervical or inguinal adenopathy. Neurologic: Grossly intact, no focal deficits, moving all 4 extremities. Psychiatric: Normal mood and affect.  Laboratory Data: PSA History  1.6 ng/mL in 07/2015  1.8 ng/mL in 07/2016  1.3 ng/mL in 07/2017  2.1 ng/mL in 07/2018  Lab Results  Component Value Date   CREATININE 1.13 03/02/2018       Component Value Date/Time   CHOL 104 03/02/2018 0902   HDL 36 (L) 09/01/2017 1014   CHOLHDL 2.9 09/01/2017 1014   VLDL 18 03/02/2018 0902   LDLCALC 54 09/01/2017 1014    Lab Results  Component Value Date   AST 43 (H) 03/02/2018   Lab Results  Component Value Date   ALT 49 (H) 03/02/2018   I have reviewed the labs   Assessment & Plan:    1. History of rUTI's No symptoms at this time  2. BPH with LUTS IPSS score is 12/5, it is worsening Continue conservative management, avoiding bladder irritants and timed voiding's Most bothersome symptoms is/are nocturia  Continue  tamsulosin 0.4 mg daily: refills given RTC in 12 months for IPSS, PSA and exam   3. Bilateral nephrolithiasis No flank pain or passage of fragments  4. Erectile dysfunction Not interested in treatment    Return in about 1 year (around 08/10/2019) for IPSS, PSA and exam.  These notes generated with voice recognition software. I apologize for typographical errors.  Zara Council, PA-C  [facility] [address] [place], [place] [address] [phone]

## 2018-09-05 ENCOUNTER — Ambulatory Visit (INDEPENDENT_AMBULATORY_CARE_PROVIDER_SITE_OTHER): Payer: Medicare Other | Admitting: Family Medicine

## 2018-09-05 ENCOUNTER — Encounter: Payer: Self-pay | Admitting: Family Medicine

## 2018-09-05 VITALS — BP 112/64 | HR 63 | Temp 98.5°F | Ht 70.0 in | Wt 232.2 lb

## 2018-09-05 DIAGNOSIS — Z7189 Other specified counseling: Secondary | ICD-10-CM | POA: Diagnosis not present

## 2018-09-05 DIAGNOSIS — K7469 Other cirrhosis of liver: Secondary | ICD-10-CM

## 2018-09-05 DIAGNOSIS — E7800 Pure hypercholesterolemia, unspecified: Secondary | ICD-10-CM | POA: Diagnosis not present

## 2018-09-05 DIAGNOSIS — D500 Iron deficiency anemia secondary to blood loss (chronic): Secondary | ICD-10-CM

## 2018-09-05 DIAGNOSIS — I8510 Secondary esophageal varices without bleeding: Secondary | ICD-10-CM | POA: Diagnosis not present

## 2018-09-05 DIAGNOSIS — Z23 Encounter for immunization: Secondary | ICD-10-CM

## 2018-09-05 DIAGNOSIS — I2583 Coronary atherosclerosis due to lipid rich plaque: Secondary | ICD-10-CM

## 2018-09-05 DIAGNOSIS — I10 Essential (primary) hypertension: Secondary | ICD-10-CM | POA: Diagnosis not present

## 2018-09-05 DIAGNOSIS — G7000 Myasthenia gravis without (acute) exacerbation: Secondary | ICD-10-CM | POA: Diagnosis not present

## 2018-09-05 DIAGNOSIS — D692 Other nonthrombocytopenic purpura: Secondary | ICD-10-CM | POA: Diagnosis not present

## 2018-09-05 DIAGNOSIS — E119 Type 2 diabetes mellitus without complications: Secondary | ICD-10-CM | POA: Diagnosis not present

## 2018-09-05 DIAGNOSIS — I2510 Atherosclerotic heart disease of native coronary artery without angina pectoris: Secondary | ICD-10-CM | POA: Diagnosis not present

## 2018-09-05 LAB — URINALYSIS, ROUTINE W REFLEX MICROSCOPIC
Bilirubin, UA: NEGATIVE
Glucose, UA: NEGATIVE
Ketones, UA: NEGATIVE
NITRITE UA: POSITIVE — AB
Specific Gravity, UA: 1.025 (ref 1.005–1.030)
Urobilinogen, Ur: 1 mg/dL (ref 0.2–1.0)
pH, UA: 5.5 (ref 5.0–7.5)

## 2018-09-05 LAB — MICROSCOPIC EXAMINATION: RBC MICROSCOPIC, UA: NONE SEEN /HPF (ref 0–2)

## 2018-09-05 LAB — BAYER DCA HB A1C WAIVED: HB A1C (BAYER DCA - WAIVED): 5.9 % (ref <7.0)

## 2018-09-05 MED ORDER — HYDROCHLOROTHIAZIDE 25 MG PO TABS: 25.0000 mg | ORAL_TABLET | Freq: Every day | ORAL | 4 refills | Status: DC

## 2018-09-05 MED ORDER — METFORMIN HCL 500 MG PO TABS: 1000.0000 mg | ORAL_TABLET | Freq: Two times a day (BID) | ORAL | 4 refills | Status: DC

## 2018-09-05 MED ORDER — TRAMADOL HCL 50 MG PO TABS: 50.0000 mg | ORAL_TABLET | Freq: Four times a day (QID) | ORAL | 0 refills | Status: DC | PRN

## 2018-09-05 MED ORDER — GLIPIZIDE 5 MG PO TABS: 5.0000 mg | ORAL_TABLET | Freq: Every day | ORAL | 4 refills | Status: AC

## 2018-09-05 MED ORDER — METOPROLOL SUCCINATE ER 50 MG PO TB24: 50.0000 mg | ORAL_TABLET | Freq: Every day | ORAL | 4 refills | Status: AC

## 2018-09-05 MED ORDER — SIMVASTATIN 40 MG PO TABS: 40.0000 mg | ORAL_TABLET | Freq: Every day | ORAL | 4 refills | Status: AC

## 2018-09-05 MED ORDER — LOSARTAN POTASSIUM 100 MG PO TABS: 100.0000 mg | ORAL_TABLET | Freq: Every day | ORAL | 4 refills | Status: DC

## 2018-09-05 NOTE — Assessment & Plan Note (Signed)
Check labs 

## 2018-09-05 NOTE — Assessment & Plan Note (Signed)
A voluntary discussion about advanced care planning including explanation and discussion of advanced directives was extentively discussed with the patient.  Explained about the healthcare proxy and living will was reviewed and packet with forms with expiration of how to fill them out was given.  Time spent: Encounter 16+ min individuals present: Patient 

## 2018-09-05 NOTE — Assessment & Plan Note (Addendum)
The current medical regimen is effective;  continue present plan and medications.  

## 2018-09-05 NOTE — Assessment & Plan Note (Signed)
Stable and discussed

## 2018-09-05 NOTE — Assessment & Plan Note (Signed)
Followed by GI

## 2018-09-05 NOTE — Assessment & Plan Note (Signed)
The current medical regimen is effective;  continue present plan and medications.  

## 2018-09-05 NOTE — Progress Notes (Signed)
BP 112/64   Pulse 63   Temp 98.5 F (36.9 C) (Oral)   Ht 5\' 10"  (1.778 m)   Wt 232 lb 3.7 oz (105.3 kg)   SpO2 96%   BMI 33.32 kg/m    Subjective:    Patient ID: Mason Wilson., male    DOB: June 01, 1941, 77 y.o.   MRN: [id number]  HPI: Mason Wilson is a 77 y.o. male  Chief Complaint  Patient presents with  . Annual Exam    pt had wellness exam 05/05/18  . Labs Only    pt wants to have iron checked   Patient follow-up has had a stormy summer with iron deficiency anemia and work-up being negative with endoscopy and colonoscopy not showing causes for iron deficiency anemia.  Patient had iron infusions and if his iron levels are stable today we will continue as he is if not will consider and is scheduled for a capsule endoscopy. This information from the patient and review of gastroenterology notes. Patient also followed by urology with normal exams.  Relevant past medical, surgical, family and social history reviewed and updated as indicated. Interim medical history since our last visit reviewed. Allergies and medications reviewed and updated.  Review of Systems  Constitutional: Negative.   HENT: Negative.   Eyes: Negative.   Respiratory: Negative.   Cardiovascular: Negative.   Gastrointestinal: Negative.   Endocrine: Negative.   Genitourinary: Negative.   Musculoskeletal: Negative.   Skin: Negative.   Allergic/Immunologic: Negative.   Neurological: Negative.   Hematological: Negative.   Psychiatric/Behavioral: Negative.     Per HPI unless specifically indicated above     Objective:    BP 112/64   Pulse 63   Temp 98.5 F (36.9 C) (Oral)   Ht 5\' 10"  (1.778 m)   Wt 232 lb 3.7 oz (105.3 kg)   SpO2 96%   BMI 33.32 kg/m   Wt Readings from Last 3 Encounters:  09/05/18 232 lb 3.7 oz (105.3 kg)  08/09/18 237 lb 12.8 oz (107.9 kg)  07/03/18 227 lb (103 kg)    Physical Exam  Constitutional: He is oriented to person, place, and time. He appears  well-developed and well-nourished.  HENT:  Head: Normocephalic.  Right Ear: External ear normal.  Left Ear: External ear normal.  Nose: Nose normal.  Eyes: Pupils are equal, round, and reactive to light. Conjunctivae and EOM are normal.  Neck: Normal range of motion. Neck supple. No thyromegaly present.  Cardiovascular: Normal rate, regular rhythm, normal heart sounds and intact distal pulses.  Pulmonary/Chest: Effort normal and breath sounds normal.  Abdominal: Soft. Bowel sounds are normal. There is no splenomegaly or hepatomegaly.  Musculoskeletal: Normal range of motion.  Lymphadenopathy:    He has no cervical adenopathy.  Neurological: He is alert and oriented to person, place, and time. He has normal reflexes.  Skin: Skin is warm and dry.  Psychiatric: He has a normal mood and affect. His behavior is normal. Judgment and thought content normal.    Results for orders placed or performed in visit on 08/07/18  PSA  Result Value Ref Range   Prostate Specific Ag, Serum 2.1 0.0 - 4.0 ng/mL      Assessment & Plan:   Problem List Items Addressed This Visit      Cardiovascular and Mediastinum   CAD (coronary artery disease)   Relevant Medications   losartan (COZAAR) 100 MG tablet   hydrochlorothiazide (HYDRODIURIL) 25 MG tablet   simvastatin (ZOCOR)  40 MG tablet   metoprolol succinate (TOPROL-XL) 50 MG 24 hr tablet   Essential hypertension    The current medical regimen is effective;  continue present plan and medications.       Relevant Medications   losartan (COZAAR) 100 MG tablet   hydrochlorothiazide (HYDRODIURIL) 25 MG tablet   simvastatin (ZOCOR) 40 MG tablet   metoprolol succinate (TOPROL-XL) 50 MG 24 hr tablet   Other Relevant Orders   Comprehensive metabolic panel   Lipid panel   CBC with Differential/Platelet   TSH   Secondary esophageal varices without bleeding (HCC)    Followed by GI      Relevant Medications   losartan (COZAAR) 100 MG tablet    hydrochlorothiazide (HYDRODIURIL) 25 MG tablet   simvastatin (ZOCOR) 40 MG tablet   metoprolol succinate (TOPROL-XL) 50 MG 24 hr tablet   Other Relevant Orders   Comprehensive metabolic panel   Lipid panel   TSH   Senile purpura (HCC)    Stable and discussed      Relevant Medications   losartan (COZAAR) 100 MG tablet   hydrochlorothiazide (HYDRODIURIL) 25 MG tablet   simvastatin (ZOCOR) 40 MG tablet   metoprolol succinate (TOPROL-XL) 50 MG 24 hr tablet   Other Relevant Orders   Comprehensive metabolic panel   TSH     Digestive   Hepatic cirrhosis (HCC)    Followed by GI      Relevant Orders   Lipid panel   TSH     Endocrine   Diabetes mellitus without complication (HCC)    The current medical regimen is effective;  continue present plan and medications.       Relevant Medications   metFORMIN (GLUCOPHAGE) 500 MG tablet   losartan (COZAAR) 100 MG tablet   simvastatin (ZOCOR) 40 MG tablet   glipiZIDE (GLUCOTROL) 5 MG tablet   Other Relevant Orders   Lipid panel   TSH   Bayer DCA Hb A1c Waived     Nervous and Auditory   Myasthenia gravis without acute exacerbation (HCC)    The current medical regimen is effective;  continue present plan and medications.       Relevant Orders   Lipid panel   TSH   Urinalysis, Routine w reflex microscopic     Other   Hyperlipidemia   Relevant Medications   losartan (COZAAR) 100 MG tablet   hydrochlorothiazide (HYDRODIURIL) 25 MG tablet   simvastatin (ZOCOR) 40 MG tablet   metoprolol succinate (TOPROL-XL) 50 MG 24 hr tablet   Advanced care planning/counseling discussion    A voluntary discussion about advanced care planning including explanation and discussion of advanced directives was extentively discussed with the patient.  Explained about the healthcare proxy and living will was reviewed and packet with forms with expiration of how to fill them out was given.  Time spent: Encounter 16+ min individuals present: Patient       Iron deficiency anemia due to chronic blood loss    Check labs      Relevant Orders   Ferritin   Iron   CBC with Differential/Platelet   TSH   Urinalysis, Routine w reflex microscopic    Other Visit Diagnoses    Need for influenza vaccination    -  Primary   Relevant Orders   Flu vaccine HIGH DOSE PF (Completed)       Follow up plan: Return in about 6 months (around 03/07/2019) for Hemoglobin A1c, BMP,  Lipids, ALT, AST, CBC.

## 2018-09-06 ENCOUNTER — Encounter: Payer: Self-pay | Admitting: Family Medicine

## 2018-09-06 LAB — CBC WITH DIFFERENTIAL/PLATELET
Basophils Absolute: 0 10*3/uL (ref 0.0–0.2)
Basos: 0 %
EOS (ABSOLUTE): 0 10*3/uL (ref 0.0–0.4)
EOS: 0 %
HEMATOCRIT: 30.5 % — AB (ref 37.5–51.0)
HEMOGLOBIN: 10.6 g/dL — AB (ref 13.0–17.7)
IMMATURE GRANS (ABS): 0 10*3/uL (ref 0.0–0.1)
Immature Granulocytes: 1 %
Lymphocytes Absolute: 0.3 10*3/uL — ABNORMAL LOW (ref 0.7–3.1)
Lymphs: 8 %
MCH: 31.6 pg (ref 26.6–33.0)
MCHC: 34.8 g/dL (ref 31.5–35.7)
MCV: 91 fL (ref 79–97)
Monocytes Absolute: 0.2 10*3/uL (ref 0.1–0.9)
Monocytes: 6 %
NEUTROS PCT: 85 %
Neutrophils Absolute: 3.6 10*3/uL (ref 1.4–7.0)
Platelets: 71 10*3/uL — CL (ref 150–450)
RBC: 3.35 x10E6/uL — ABNORMAL LOW (ref 4.14–5.80)
RDW: 13.5 % (ref 12.3–15.4)
WBC: 4.2 10*3/uL (ref 3.4–10.8)

## 2018-09-06 LAB — COMPREHENSIVE METABOLIC PANEL
ALBUMIN: 4.1 g/dL (ref 3.5–4.8)
ALT: 56 IU/L — ABNORMAL HIGH (ref 0–44)
AST: 31 IU/L (ref 0–40)
Albumin/Globulin Ratio: 1.9 (ref 1.2–2.2)
Alkaline Phosphatase: 62 IU/L (ref 39–117)
BILIRUBIN TOTAL: 1 mg/dL (ref 0.0–1.2)
BUN / CREAT RATIO: 24 (ref 10–24)
BUN: 27 mg/dL (ref 8–27)
CHLORIDE: 105 mmol/L (ref 96–106)
CO2: 22 mmol/L (ref 20–29)
Calcium: 9.5 mg/dL (ref 8.6–10.2)
Creatinine, Ser: 1.11 mg/dL (ref 0.76–1.27)
GFR calc non Af Amer: 64 mL/min/{1.73_m2} (ref >59)
GFR, EST AFRICAN AMERICAN: 74 mL/min/{1.73_m2} (ref >59)
GLUCOSE: 89 mg/dL (ref 65–99)
Globulin, Total: 2.2 g/dL (ref 1.5–4.5)
Potassium: 3.5 mmol/L (ref 3.5–5.2)
Sodium: 146 mmol/L — ABNORMAL HIGH (ref 134–144)
TOTAL PROTEIN: 6.3 g/dL (ref 6.0–8.5)

## 2018-09-06 LAB — FERRITIN: Ferritin: 54 ng/mL (ref 30–400)

## 2018-09-06 LAB — TSH: TSH: 1.27 u[IU]/mL (ref 0.450–4.500)

## 2018-09-06 LAB — LIPID PANEL
CHOL/HDL RATIO: 3.1 ratio (ref 0.0–5.0)
Cholesterol, Total: 142 mg/dL (ref 100–199)
HDL: 46 mg/dL (ref >39)
LDL CALC: 73 mg/dL (ref 0–99)
Triglycerides: 114 mg/dL (ref 0–149)
VLDL CHOLESTEROL CAL: 23 mg/dL (ref 5–40)

## 2018-09-06 LAB — IRON: Iron: 77 ug/dL (ref 38–169)

## 2018-09-19 ENCOUNTER — Ambulatory Visit (INDEPENDENT_AMBULATORY_CARE_PROVIDER_SITE_OTHER): Payer: Medicare Other | Admitting: Family Medicine

## 2018-09-19 ENCOUNTER — Encounter: Payer: Self-pay | Admitting: Family Medicine

## 2018-09-19 VITALS — BP 98/61 | HR 78 | Temp 98.1°F | Ht 69.0 in | Wt 231.3 lb

## 2018-09-19 DIAGNOSIS — R350 Frequency of micturition: Secondary | ICD-10-CM | POA: Diagnosis not present

## 2018-09-19 DIAGNOSIS — R509 Fever, unspecified: Secondary | ICD-10-CM

## 2018-09-19 DIAGNOSIS — G7000 Myasthenia gravis without (acute) exacerbation: Secondary | ICD-10-CM | POA: Diagnosis not present

## 2018-09-19 DIAGNOSIS — N390 Urinary tract infection, site not specified: Secondary | ICD-10-CM | POA: Diagnosis not present

## 2018-09-19 DIAGNOSIS — Z7952 Long term (current) use of systemic steroids: Secondary | ICD-10-CM | POA: Diagnosis not present

## 2018-09-19 DIAGNOSIS — I2583 Coronary atherosclerosis due to lipid rich plaque: Secondary | ICD-10-CM

## 2018-09-19 DIAGNOSIS — I2510 Atherosclerotic heart disease of native coronary artery without angina pectoris: Secondary | ICD-10-CM

## 2018-09-19 LAB — UA/M W/RFLX CULTURE, ROUTINE
BILIRUBIN UA: NEGATIVE
GLUCOSE, UA: NEGATIVE
Nitrite, UA: POSITIVE — AB
Specific Gravity, UA: 1.015 (ref 1.005–1.030)
Urobilinogen, Ur: 1 mg/dL (ref 0.2–1.0)
pH, UA: 5 (ref 5.0–7.5)

## 2018-09-19 LAB — CBC WITH DIFFERENTIAL/PLATELET
HEMATOCRIT: 28.9 % — AB (ref 37.5–51.0)
Hemoglobin: 10.2 g/dL — ABNORMAL LOW (ref 13.0–17.7)
LYMPHS ABS: 0.3 10*3/uL — AB (ref 0.7–3.1)
LYMPHS: 5 %
MCH: 32.3 pg (ref 26.6–33.0)
MCHC: 35.3 g/dL (ref 31.5–35.7)
MCV: 92 fL (ref 79–97)
MID (ABSOLUTE): 0.5 10*3/uL (ref 0.1–1.6)
MID: 8 %
Neutrophils Absolute: 5.2 10*3/uL (ref 1.4–7.0)
Neutrophils: 87 %
Platelets: 76 10*3/uL — CL (ref 150–450)
RBC: 3.16 x10E6/uL — AB (ref 4.14–5.80)
RDW: 15.9 % — ABNORMAL HIGH (ref 12.3–15.4)
WBC: 6 10*3/uL (ref 3.4–10.8)

## 2018-09-19 LAB — MICROSCOPIC EXAMINATION: WBC, UA: >30 /hpf — AB (ref 0–5)

## 2018-09-19 MED ORDER — SULFAMETHOXAZOLE-TRIMETHOPRIM 800-160 MG PO TABS: 1.0000 | ORAL_TABLET | Freq: Two times a day (BID) | ORAL | 0 refills | Status: DC

## 2018-09-19 NOTE — Progress Notes (Signed)
BP 98/61 (BP Location: Right Arm, Patient Position: Sitting, Cuff Size: Normal)   Pulse 78   Temp 98.1 F (36.7 C) (Oral)   Ht 5\' 9"  (1.753 m)   Wt 231 lb 4.8 oz (104.9 kg)   SpO2 93%   BMI 34.16 kg/m    Subjective:    Patient ID: Mason Chute., male    DOB: 27-Dec-1940, 77 y.o.   MRN: [id number]  HPI: Mason Wilson is a 77 y.o. male  Chief Complaint  Patient presents with  . Urinary Frequency    Ongoing for 1 month  . Urinary Retention  . Fever    Ongoing for 5 days  . Fatigue   Patient with hx of BPH and recurrent UTIs presents today with 1 month of worsening urinary frequency, dysuria, poor stream quality, and fatigue. The past week notes sxs getting progressively worse and had one day with 103 degree fevers. Getting up 4 times nightly to urinate. Has not been trying anything OTC for sxs. Currently on flomax managed by Urology. Denies abdominal pain, N/V, low back pain, altered mental status.   Wife states Neurologist called yesterday and told them he needed to speak with PCP about getting a DXA scan given his long-term use of prednisone daily the past year for tx of his Myasthenia. Taking OTC calcium and vit D. No recent fractures noted.   Relevant past medical, surgical, family and social history reviewed and updated as indicated. Interim medical history since our last visit reviewed. Allergies and medications reviewed and updated.  Review of Systems  Per HPI unless specifically indicated above     Objective:    BP 98/61 (BP Location: Right Arm, Patient Position: Sitting, Cuff Size: Normal)   Pulse 78   Temp 98.1 F (36.7 C) (Oral)   Ht 5\' 9"  (1.753 m)   Wt 231 lb 4.8 oz (104.9 kg)   SpO2 93%   BMI 34.16 kg/m   Wt Readings from Last 3 Encounters:  09/19/18 231 lb 4.8 oz (104.9 kg)  09/05/18 232 lb 3.7 oz (105.3 kg)  08/09/18 237 lb 12.8 oz (107.9 kg)    Physical Exam  Constitutional: He appears well-developed and well-nourished.  HENT:  Head:  Atraumatic.  Eyes: Pupils are equal, round, and reactive to light. Conjunctivae are normal.  Neck: Normal range of motion. Neck supple.  Cardiovascular: Normal rate and normal heart sounds.  Pulmonary/Chest: Effort normal and breath sounds normal.  Abdominal: Soft. Bowel sounds are normal. He exhibits no distension and no mass. There is no tenderness. There is no guarding.  Genitourinary:  Genitourinary Comments: Prostate exam declined  Musculoskeletal: He exhibits no edema.  ROM at baseline  Neurological: He is alert.  Skin: Skin is warm and dry. No rash noted. He is not diaphoretic.  Psychiatric: He has a normal mood and affect. His behavior is normal.  Nursing note and vitals reviewed.   Results for orders placed or performed in visit on 09/19/18  Microscopic Examination  Result Value Ref Range   WBC, UA >30 (A) 0 - 5 /hpf   RBC, UA 0-2 0 - 2 /hpf   Epithelial Cells (non renal) 0-10 0 - 10 /hpf   Bacteria, UA Moderate (A) None seen/Few  UA/M w/rflx Culture, Routine  Result Value Ref Range   Specific Gravity, UA 1.015 1.005 - 1.030   pH, UA 5.0 5.0 - 7.5   Color, UA Brown (A) Yellow   Appearance Ur Turbid (A) Clear  Leukocytes, UA 3+ (A) Negative   Protein, UA 1+ (A) Negative/Trace   Glucose, UA Negative Negative   Ketones, UA 1+ (A) Negative   RBC, UA Trace (A) Negative   Bilirubin, UA Negative Negative   Urobilinogen, Ur 1.0 0.2 - 1.0 mg/dL   Nitrite, UA Positive (A) Negative   Microscopic Examination See below:   CBC With Differential/Platelet  Result Value Ref Range   WBC 6.0 3.4 - 10.8 x10E3/uL   RBC 3.16 (L) 4.14 - 5.80 x10E6/uL   Hemoglobin 10.2 (L) 13.0 - 17.7 g/dL   Hematocrit 28.9 (L) 37.5 - 51.0 %   MCV 92 79 - 97 fL   MCH 32.3 26.6 - 33.0 pg   MCHC 35.3 31.5 - 35.7 g/dL   RDW 15.9 (H) 12.3 - 15.4 %   Platelets 76 (LL) 150 - 450 x10E3/uL   Neutrophils 87 Not Estab. %   Lymphs 5 Not Estab. %   MID 8 Not Estab. %   Neutrophils Absolute 5.2 1.4 - 7.0  x10E3/uL   Lymphocytes Absolute 0.3 (L) 0.7 - 3.1 x10E3/uL   MID (Absolute) 0.5 0.1 - 1.6 X10E3/uL      Assessment & Plan:   Problem List Items Addressed This Visit      Nervous and Auditory   Myasthenia gravis without acute exacerbation ([place])    Managed by Neurology, DXA ordered today per their request given his long term prednisone usage      Relevant Orders   DG BONE DENSITY (DXA)    Other Visit Diagnoses    Recurrent UTI    -  Primary   U/A +, await culture and tx with bactrim. Push fluids. Recheck U/A and BMP in 1 week. If not improving, call Urology and if worsening go to ER   Relevant Medications   sulfamethoxazole-trimethoprim (BACTRIM DS,SEPTRA DS) 800-160 MG tablet   Other Relevant Orders   UA/M w/rflx Culture, Routine (Completed)   Comprehensive metabolic panel   Urine Culture   Fever, unspecified fever cause       Relevant Orders   CBC With Differential/Platelet (Completed)   Comprehensive metabolic panel   Long-term corticosteroid use       Relevant Orders   DG BONE DENSITY (DXA)       Follow up plan: Return in about 1 week (around 09/26/2018) for U/A, BMP.

## 2018-09-19 NOTE — Assessment & Plan Note (Signed)
Managed by Neurology, DXA ordered today per their request given his long term prednisone usage

## 2018-09-19 NOTE — Patient Instructions (Signed)
[facility] - call to schedule your bone density scan [phone]

## 2018-09-20 LAB — COMPREHENSIVE METABOLIC PANEL
ALBUMIN: 3.8 g/dL (ref 3.5–4.8)
ALT: 51 IU/L — ABNORMAL HIGH (ref 0–44)
AST: 34 IU/L (ref 0–40)
Albumin/Globulin Ratio: 1.5 (ref 1.2–2.2)
Alkaline Phosphatase: 97 IU/L (ref 39–117)
BILIRUBIN TOTAL: 1.7 mg/dL — AB (ref 0.0–1.2)
BUN/Creatinine Ratio: 29 — ABNORMAL HIGH (ref 10–24)
BUN: 35 mg/dL — AB (ref 8–27)
CHLORIDE: 103 mmol/L (ref 96–106)
CO2: 22 mmol/L (ref 20–29)
Calcium: 10.1 mg/dL (ref 8.6–10.2)
Creatinine, Ser: 1.21 mg/dL (ref 0.76–1.27)
GFR calc Af Amer: 67 mL/min/{1.73_m2} (ref >59)
GFR calc non Af Amer: 58 mL/min/{1.73_m2} — ABNORMAL LOW (ref >59)
GLOBULIN, TOTAL: 2.6 g/dL (ref 1.5–4.5)
GLUCOSE: 106 mg/dL — AB (ref 65–99)
Potassium: 4.2 mmol/L (ref 3.5–5.2)
Sodium: 141 mmol/L (ref 134–144)
Total Protein: 6.4 g/dL (ref 6.0–8.5)

## 2018-09-21 ENCOUNTER — Telehealth: Payer: Self-pay | Admitting: Family Medicine

## 2018-09-21 LAB — URINE CULTURE

## 2018-09-21 NOTE — Telephone Encounter (Signed)
Charted in result notes. 

## 2018-09-21 NOTE — Telephone Encounter (Signed)
Copied from [place] [id number]. Topic: Quick Communication - Lab Results (Clinic Use ONLY) >> Sep 21, 2018  2:58 PM Georgina Peer, CMA wrote: Called patient to inform them of lab results. When patient returns call, triage nurse may disclose results. Result note routed to nurse triage pool.

## 2018-09-25 ENCOUNTER — Encounter: Payer: Self-pay | Admitting: Urology

## 2018-09-25 ENCOUNTER — Ambulatory Visit (INDEPENDENT_AMBULATORY_CARE_PROVIDER_SITE_OTHER): Payer: Medicare Other | Admitting: Urology

## 2018-09-25 VITALS — BP 101/57 | HR 76 | Ht 69.0 in | Wt 233.0 lb

## 2018-09-25 DIAGNOSIS — I2510 Atherosclerotic heart disease of native coronary artery without angina pectoris: Secondary | ICD-10-CM

## 2018-09-25 DIAGNOSIS — Z87440 Personal history of urinary (tract) infections: Secondary | ICD-10-CM | POA: Diagnosis not present

## 2018-09-25 DIAGNOSIS — N200 Calculus of kidney: Secondary | ICD-10-CM

## 2018-09-25 DIAGNOSIS — I2583 Coronary atherosclerosis due to lipid rich plaque: Secondary | ICD-10-CM | POA: Diagnosis not present

## 2018-09-25 DIAGNOSIS — N401 Enlarged prostate with lower urinary tract symptoms: Secondary | ICD-10-CM | POA: Diagnosis not present

## 2018-09-25 DIAGNOSIS — N138 Other obstructive and reflux uropathy: Secondary | ICD-10-CM

## 2018-09-25 LAB — BLADDER SCAN AMB NON-IMAGING

## 2018-09-25 NOTE — Progress Notes (Signed)
09/25/2018 9:46 AM   Mason Wilson. [birth year] [id number]  Referring provider: [name], MD [address] [place], [place] [address]  Chief Complaint  Patient presents with  . Benign Prostatic Hypertrophy    follow up    HPI: Patient is a 77 year old Caucasian male with a history of UTI, BPH with LU TS, bilateral nephrolithiasis and ED who presents for follow up.    History of rUTI's Risk factors: age, Urine culture in 10/2017 positive for E.coli resistant to ampicillin.  He was having an increase in nocturia, dysuria, high fever (103.2), urinating little amounts and felt run down.  He presented to his PCP's office for further evaluation.  He was started on Septra DS.  Urine culture in 09/2018 positive for E.coli resistant to ampicillin.  He states he is feeling somewhat better.  He is concerned as it usually takes three days of antibiotics to experience relief, but he states with this infection, it has taken 5 days.  His PVR is 0 mL.    BPH WITH LUTS  (prostate and/or bladder) IPSS score: 23/4   Previous score: 12/5   Previous PVR: 74 mL Major complaint(s): Frequency, dysuria and nocturia x several years. Denies any dysuria, hematuria or suprapubic pain.  Currently taking: tamsulosin 0.4 mg daily   Denies any recent fevers, chills, nausea or vomiting.   IPSS    Row Name 09/25/18 0900         International Prostate Symptom Score   How often have you had the sensation of not emptying your bladder?  More than half the time     How often have you had to urinate less than every two hours?  More than half the time     How often have you found you stopped and started again several times when you urinated?  Not at All     How often have you found it difficult to postpone urination?  More than half the time     How often have you had a weak urinary stream?  More than half the time     How often have you had to strain to start urination?  Less than half the time     How many  times did you typically get up at night to urinate?  5 Times     Total IPSS Score  23       Quality of Life due to urinary symptoms   If you were to spend the rest of your life with your urinary condition just the way it is now how would you feel about that?  Mostly Disatisfied        Score:  1-7 Mild 8-19 Moderate 20-35 Severe    Bilateral nephrolithiasis Tiny punctate stones bilaterally on CT in 2016.  He has not had any flank pain or passage of fragments.     Erectile dysfunction Trimix has not been effective.  Not interested in sex at thia time.  Has no energy due to iron issues.    PMH: Past Medical History:  Diagnosis Date  . Allergy   . Arthritis    "everywhere' - worse in back  . Body mass index 39.0-39.9, adult   . CAD (coronary artery disease)   . Chronic back pain   . Cirrhosis, non-alcoholic ([place])   . Diabetes mellitus without complication ([place])   . Elevated liver enzymes   . GERD (gastroesophageal reflux disease)   . Heart murmur   .  Hyperlipidemia   . Hypertension   . Hypogonadism in male   . Melanoma ([place])    melanoma   . Melanoma in situ of right shoulder ([place])   . Myasthenia gravis ([place]) 01/2018  . Renal stones   . Sleep apnea    CPAP  . Uses hearing aid    doesn't wear    Surgical History: Past Surgical History:  Procedure Laterality Date  . ANGIOPLASTY  1999   3 stents  . CARDIAC CATHETERIZATION     stents placed  . CARPAL TUNNEL RELEASE     x2  . CATARACT EXTRACTION, BILATERAL    . COLONOSCOPY WITH PROPOFOL N/A 07/03/2018   Procedure: COLONOSCOPY WITH PROPOFOL;  Surgeon: Lucilla Lame, MD;  Location: [facility];  Service: Endoscopy;  Laterality: N/A;  . ESOPHAGOGASTRODUODENOSCOPY (EGD) WITH PROPOFOL N/A 09/08/2015   Procedure: ESOPHAGOGASTRODUODENOSCOPY (EGD) WITH PROPOFOL;  Surgeon: Lucilla Lame, MD;  Location: [facility];  Service: Endoscopy;  Laterality: N/A;  CPAP Diabetic - oral meds  . ESOPHAGOGASTRODUODENOSCOPY  (EGD) WITH PROPOFOL N/A 07/03/2018   Procedure: ESOPHAGOGASTRODUODENOSCOPY (EGD) WITH PROPOFOL;  Surgeon: Lucilla Lame, MD;  Location: [facility];  Service: Endoscopy;  Laterality: N/A;  diabetic - oral meds sleep apnea  . LIVER BIOPSY    . POLYPECTOMY N/A 07/03/2018   Procedure: POLYPECTOMY;  Surgeon: Lucilla Lame, MD;  Location: [facility];  Service: Endoscopy;  Laterality: N/A;  . SKIN LESION EXCISION  1998  . SKIN LESION EXCISION     skin cancer removed on forehead in June 2018  . TONSILLECTOMY     age 77    Home Medications:  Allergies as of 09/25/2018      Reactions   Altace [ramipril] Cough      Medication List        Accurate as of 09/25/18  9:46 AM. Always use your most recent med list.          CALCIUM 1200 PO Take 1,200 mg by mouth.   DIALYVITE VITAMIN D3 MAX 1.25 MG (50000 UT) Tabs Generic drug:  Cholecalciferol TK 1 T PO 1 TIME A WK FOR 8 WKS   glipiZIDE 5 MG tablet Commonly known as:  GLUCOTROL Take 1 tablet (5 mg total) by mouth daily before breakfast.   hydrochlorothiazide 25 MG tablet Commonly known as:  HYDRODIURIL Take 1 tablet (25 mg total) by mouth daily.   losartan 100 MG tablet Commonly known as:  COZAAR Take 1 tablet (100 mg total) by mouth daily.   metFORMIN 500 MG tablet Commonly known as:  GLUCOPHAGE Take 2 tablets (1,000 mg total) by mouth 2 (two) times daily with a meal.   metoprolol succinate 50 MG 24 hr tablet Commonly known as:  TOPROL-XL Take 1 tablet (50 mg total) by mouth daily. Take with or immediately following a meal.   omeprazole 20 MG capsule Commonly known as:  PRILOSEC Take 20 mg by mouth daily. AM   predniSONE 20 MG tablet Commonly known as:  DELTASONE   pyridostigmine 60 MG tablet Commonly known as:  MESTINON Take 60 mg by mouth 4 (four) times daily.   simvastatin 40 MG tablet Commonly known as:  ZOCOR Take 1 tablet (40 mg total) by mouth daily at 6 PM.   sulfamethoxazole-trimethoprim  800-160 MG tablet Commonly known as:  BACTRIM DS,SEPTRA DS Take 1 tablet by mouth 2 (two) times daily.   tamsulosin 0.4 MG Caps capsule Commonly known as:  FLOMAX TAKE 1 CAPSULE(0.4 MG) BY MOUTH  DAILY   tiZANidine 4 MG tablet Commonly known as:  ZANAFLEX Take 4 mg by mouth every 6 (six) hours as needed for muscle spasms.   traMADol 50 MG tablet Commonly known as:  ULTRAM Take 1 tablet (50 mg total) by mouth every 6 (six) hours as needed.       Allergies:  Allergies  Allergen Reactions  . Altace [Ramipril] Cough    Family History: Family History  Problem Relation Age of Onset  . Thyroid disease Mother   . Alzheimer's disease Mother   . Heart disease Mother   . Cancer Father 40       testicular  . Heart disease Father   . Hypertension Father   . Heart attack Father   . Kidney disease Neg Hx   . Prostate cancer Neg Hx   . Kidney cancer Neg Hx   . Bladder Cancer Neg Hx     Social History:  reports that he quit smoking about 48 years ago. His smoking use included cigarettes. His smokeless tobacco use includes chew. He reports that he does not drink alcohol or use drugs.  ROS: UROLOGY Frequent Urination?: Yes Hard to postpone urination?: No Burning/pain with urination?: Yes Get up at night to urinate?: Yes Leakage of urine?: No Urine stream starts and stops?: No Trouble starting stream?: No Do you have to strain to urinate?: No Blood in urine?: No Urinary tract infection?: Yes Sexually transmitted disease?: No Injury to kidneys or bladder?: No Painful intercourse?: No Weak stream?: No Erection problems?: No Penile pain?: No  Gastrointestinal Nausea?: No Vomiting?: No Indigestion/heartburn?: No Diarrhea?: No Constipation?: No  Constitutional Fever: No Night sweats?: No Weight loss?: No Fatigue?: No  Skin Skin rash/lesions?: No Itching?: No  Eyes Blurred vision?: No Double vision?: No  Ears/Nose/Throat Sore throat?: No Sinus problems?:  No  Hematologic/Lymphatic Swollen glands?: No Easy bruising?: No  Cardiovascular Leg swelling?: No Chest pain?: No  Respiratory Cough?: No Shortness of breath?: No  Endocrine Excessive thirst?: No  Musculoskeletal Back pain?: No Joint pain?: No  Neurological Headaches?: No Dizziness?: No  Psychologic Depression?: No Anxiety?: No  Physical Exam: BP (!) 101/57   Pulse 76   Ht 5\' 9"  (1.753 m)   Wt 233 lb (105.7 kg)   BMI 34.41 kg/m   Constitutional: Well nourished. Alert and oriented, No acute distress. HEENT: [place] AT, moist mucus membranes. Trachea midline, no masses. Cardiovascular: No clubbing, cyanosis, or edema. Respiratory: Normal respiratory effort, no increased work of breathing. Skin: No rashes, bruises or suspicious lesions. Lymph: No cervical or inguinal adenopathy. Neurologic: Grossly intact, no focal deficits, moving all 4 extremities. Psychiatric: Normal mood and affect.  Laboratory Data: PSA History  1.6 ng/mL in 07/2015  1.8 ng/mL in 07/2016  1.3 ng/mL in 07/2017  2.1 ng/mL in 07/2018  Lab Results  Component Value Date   CREATININE 1.21 09/19/2018       Component Value Date/Time   CHOL 142 09/05/2018 1057   CHOL 104 03/02/2018 0902   HDL 46 09/05/2018 1057   CHOLHDL 3.1 09/05/2018 1057   VLDL 18 03/02/2018 0902   LDLCALC 73 09/05/2018 1057    Lab Results  Component Value Date   AST 34 09/19/2018   Lab Results  Component Value Date   ALT 51 (H) 09/19/2018   I have reviewed the labs   Assessment & Plan:    1. History of rUTI's Recent UTI - currently on Septra DS PVR is 0 mL Will obtain a  RUS to evaluate for possible nidus for infection  RTC for RUS report   2. BPH with LUTS IPSS score is 23/4, it is worsening Continue conservative management, avoiding bladder irritants and timed voiding's Most bothersome symptoms is/are nocturia  Continue tamsulosin 0.4 mg daily: refills given RTC in 12 months for IPSS, PSA and exam    3. Bilateral nephrolithiasis No flank pain or passage of fragments  4. Erectile dysfunction Not interested in treatment    Return for RTC for RUS report .  These notes generated with voice recognition software. I apologize for typographical errors.  Zara Council, PA-C  [facility] [address] [place], [place] [address] [phone]

## 2018-09-26 ENCOUNTER — Other Ambulatory Visit: Payer: Self-pay | Admitting: *Deleted

## 2018-09-26 ENCOUNTER — Ambulatory Visit: Payer: Medicare Other | Admitting: Nurse Practitioner

## 2018-09-26 DIAGNOSIS — D509 Iron deficiency anemia, unspecified: Secondary | ICD-10-CM

## 2018-09-27 ENCOUNTER — Inpatient Hospital Stay: Payer: Medicare Other

## 2018-09-27 ENCOUNTER — Inpatient Hospital Stay: Payer: Medicare Other | Attending: Internal Medicine

## 2018-09-27 ENCOUNTER — Inpatient Hospital Stay (HOSPITAL_BASED_OUTPATIENT_CLINIC_OR_DEPARTMENT_OTHER): Payer: Medicare Other | Admitting: Internal Medicine

## 2018-09-27 VITALS — BP 116/63 | HR 76 | Temp 98.0°F | Resp 16 | Wt 233.0 lb

## 2018-09-27 DIAGNOSIS — K7460 Unspecified cirrhosis of liver: Secondary | ICD-10-CM

## 2018-09-27 DIAGNOSIS — D61818 Other pancytopenia: Secondary | ICD-10-CM | POA: Diagnosis not present

## 2018-09-27 DIAGNOSIS — N390 Urinary tract infection, site not specified: Secondary | ICD-10-CM | POA: Diagnosis not present

## 2018-09-27 DIAGNOSIS — K766 Portal hypertension: Secondary | ICD-10-CM | POA: Diagnosis not present

## 2018-09-27 DIAGNOSIS — R5383 Other fatigue: Secondary | ICD-10-CM | POA: Diagnosis not present

## 2018-09-27 DIAGNOSIS — G7000 Myasthenia gravis without (acute) exacerbation: Secondary | ICD-10-CM | POA: Insufficient documentation

## 2018-09-27 DIAGNOSIS — D509 Iron deficiency anemia, unspecified: Secondary | ICD-10-CM

## 2018-09-27 DIAGNOSIS — R161 Splenomegaly, not elsewhere classified: Secondary | ICD-10-CM | POA: Diagnosis not present

## 2018-09-27 DIAGNOSIS — D500 Iron deficiency anemia secondary to blood loss (chronic): Secondary | ICD-10-CM

## 2018-09-27 DIAGNOSIS — D649 Anemia, unspecified: Secondary | ICD-10-CM | POA: Insufficient documentation

## 2018-09-27 LAB — CBC WITH DIFFERENTIAL/PLATELET
Abs Immature Granulocytes: 0.08 10*3/uL — ABNORMAL HIGH (ref 0.00–0.07)
Basophils Absolute: 0 10*3/uL (ref 0.0–0.1)
Basophils Relative: 0 %
Eosinophils Absolute: 0 10*3/uL (ref 0.0–0.5)
Eosinophils Relative: 0 %
HCT: 27.1 % — ABNORMAL LOW (ref 39.0–52.0)
Hemoglobin: 8.8 g/dL — ABNORMAL LOW (ref 13.0–17.0)
Immature Granulocytes: 2 %
Lymphocytes Relative: 7 %
Lymphs Abs: 0.4 10*3/uL — ABNORMAL LOW (ref 0.7–4.0)
MCH: 31.3 pg (ref 26.0–34.0)
MCHC: 32.5 g/dL (ref 30.0–36.0)
MCV: 96.4 fL (ref 80.0–100.0)
Monocytes Absolute: 0.2 10*3/uL (ref 0.1–1.0)
Monocytes Relative: 4 %
Neutro Abs: 4.2 10*3/uL (ref 1.7–7.7)
Neutrophils Relative %: 87 %
Platelets: 70 10*3/uL — ABNORMAL LOW (ref 150–400)
RBC: 2.81 MIL/uL — ABNORMAL LOW (ref 4.22–5.81)
RDW: 15.8 % — ABNORMAL HIGH (ref 11.5–15.5)
WBC: 4.9 10*3/uL (ref 4.0–10.5)
nRBC: 0 % (ref 0.0–0.2)

## 2018-09-27 LAB — FERRITIN: Ferritin: 133 ng/mL (ref 24–336)

## 2018-09-27 LAB — IRON AND TIBC
Iron: 73 ug/dL (ref 45–182)
Saturation Ratios: 26 % (ref 17.9–39.5)
TIBC: 277 ug/dL (ref 250–450)
UIBC: 204 ug/dL

## 2018-09-27 MED ORDER — IRON SUCROSE 20 MG/ML IV SOLN
200.0000 mg | Freq: Once | INTRAVENOUS | Status: AC
  Administered 2018-09-27: 200 mg via INTRAVENOUS
  Filled 2018-09-27: qty 10

## 2018-09-27 MED ORDER — SODIUM CHLORIDE 0.9 % IV SOLN
Freq: Once | INTRAVENOUS | Status: AC
  Administered 2018-09-27: 13:00:00 via INTRAVENOUS
  Filled 2018-09-27: qty 250

## 2018-09-27 NOTE — Assessment & Plan Note (Addendum)
#   Pancytopenia- Asymptomatic. Today labs white count- 6/ hemoglobin 8.8 platelets of 70-secondary to cirrhosis and portal hypertension splenomegaly STABLE.   #Anemia -worse hemoglobin 8.8 baseline around 10-no obvious GI bleed.  Iron studies pending today.  Proceed with Venofer x1 today.  Will call family regarding iron studies.  # Cirrhosis/portal hypertension- Question Karlene Lineman s/p Bx. follows up with Dr. Singleterry Norris.  Stable.  Recommend small bowel evaluation/capsule study.  # MG-as per neurology status post plasma exchange; stable.  On prednisone.  #Recent UTI-status post antibiotics improved.  #Given the transportation issues-patient/family interested in following up closer home; which I think is reasonable.  However, they are willing to go to [facility] if issues become complicated.  # DISPOSITION: wil call re: Iron labs from today.  # Proceed with IV iron infusion today. # follow up in 3 months/MD/ labs- cbc/cmp//possible venofer-Dr.B  Addendum: Iron studies from the visit shows saturation 26% ferritin 133 hemoglobin 8.8.  Hold further IV iron infusions.  Anemia likely secondary to liver disease/chronic disease.  Cc; Dr.Addepalli/  Dr.Crissman.

## 2018-09-27 NOTE — Progress Notes (Signed)
[facility] OFFICE PROGRESS NOTE  Patient Care Team: [name], MD as PCP - General (Family Medicine) Lucilla Lame, MD as Consulting Physician (Gastroenterology) Cammie Sickle, MD as Consulting Physician (Hematology and Oncology)  HPI  SUMMARY of HEMATOLOGIC HISTORY:   # 2016- PANCYTOPENIA sec to Cirrhosis/splenomegaly.   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  Patient was recently evaluated with [facility].  Patient was told that he was not deficient iron.  Continues to complain of mild to moderate fatigue.  Denies any blood in stools black or stools.   Review of Systems  Constitutional: Positive for malaise/fatigue. Negative for chills, diaphoresis, fever and weight loss.  HENT: Negative for nosebleeds and sore throat.   Eyes: Negative for double vision.  Respiratory: Negative for cough, hemoptysis, sputum production, shortness of breath and wheezing.   Cardiovascular: Negative for chest pain, palpitations, orthopnea and leg swelling.  Gastrointestinal: Negative for abdominal pain, blood in stool, constipation, diarrhea, heartburn, melena, nausea and vomiting.  Genitourinary: Negative for dysuria, frequency and urgency.  Musculoskeletal: Negative for back pain and joint pain.  Skin: Negative.  Negative for itching and rash.  Neurological: Negative for dizziness, tingling, focal weakness, weakness and headaches.  Endo/Heme/Allergies: Does not bruise/bleed easily.  Psychiatric/Behavioral: Negative for depression. The patient is not nervous/anxious and does not have insomnia.      I have reviewed the past medical history, past surgical history, social history and family history with the patient and they are unchanged from previous note  unless stated above.  ALLERGIES:  is allergic to altace [ramipril].  MEDICATIONS:  Current Outpatient Medications  Medication Sig Dispense Refill  . Calcium Carbonate-Vit D-Min (CALCIUM 1200 PO) Take 1,200 mg by mouth.    Larita Fife VITAMIN D3 MAX 16606 units TABS TK 1 T PO 1 TIME A WK FOR 8 WKS  0  . glipiZIDE (GLUCOTROL) 5 MG tablet Take 1 tablet (5 mg total) by mouth daily before breakfast. 90 tablet 4  . hydrochlorothiazide (HYDRODIURIL) 25 MG tablet Take 1 tablet (25 mg total) by mouth daily. 90 tablet 4  . losartan (COZAAR) 100 MG tablet Take 1 tablet (100 mg total) by mouth daily. 90 tablet 4  . metFORMIN (GLUCOPHAGE) 500 MG tablet Take 2 tablets (1,000 mg total) by mouth 2 (two) times daily with a meal. 360 tablet 4  . metoprolol succinate (TOPROL-XL) 50 MG 24 hr tablet Take 1 tablet (50 mg total) by mouth daily. Take with or immediately following a meal. 90 tablet 4  . omeprazole (PRILOSEC) 20 MG capsule Take 20 mg by mouth daily. AM    . predniSONE (DELTASONE) 20 MG tablet   3  . pyridostigmine (MESTINON) 60 MG tablet Take 60 mg by mouth 4 (four) times daily.    . simvastatin (ZOCOR) 40 MG tablet Take 1 tablet (40 mg total) by mouth daily at 6 PM. 90 tablet 4  . sulfamethoxazole-trimethoprim (BACTRIM DS,SEPTRA DS) 800-160 MG tablet Take 1 tablet by mouth 2 (two) times daily. 28 tablet 0  . tamsulosin (FLOMAX) 0.4 MG CAPS capsule TAKE 1 CAPSULE(0.4 MG) BY MOUTH DAILY 90 capsule 3  . tiZANidine (ZANAFLEX) 4 MG tablet Take 4 mg by mouth every 6 (six) hours as needed for muscle spasms.    . traMADol (ULTRAM) 50 MG  tablet Take 1 tablet (50 mg total) by mouth every 6 (six) hours as needed. 120 tablet 0  . glipiZIDE (GLUCOTROL) 5 MG tablet TAKE 1 TABLET(5 MG) BY MOUTH DAILY BEFORE BREAKFAST 90 tablet 0  . losartan (COZAAR) 100 MG tablet TAKE 1 TABLET(100 MG) BY MOUTH DAILY 90 tablet 0  . simvastatin (ZOCOR) 40 MG tablet TAKE 1 TABLET(40 MG) BY MOUTH DAILY AT 6 PM 90 tablet 0   No  current facility-administered medications for this visit.     PHYSICAL EXAMINATION:   BP 116/63 (BP Location: Left Arm, Patient Position: Sitting)   Pulse 76   Temp 98 F (36.7 C) (Tympanic)   Resp 16   Wt 233 lb (105.7 kg)   BMI 34.41 kg/m   Filed Weights   09/27/18 1150  Weight: 233 lb (105.7 kg)    .Physical Exam  Constitutional: He is oriented to person, place, and time and well-developed, well-nourished, and in no distress.  HENT:  Head: Normocephalic and atraumatic.  Mouth/Throat: Oropharynx is clear and moist. No oropharyngeal exudate.  Eyes: Pupils are equal, round, and reactive to light.  Neck: Normal range of motion. Neck supple.  Cardiovascular: Normal rate and regular rhythm.  Pulmonary/Chest: No respiratory distress. He has no wheezes.  Abdominal: Soft. Bowel sounds are normal. He exhibits no distension and no mass. There is no tenderness. There is no rebound and no guarding.  Musculoskeletal: Normal range of motion. He exhibits no edema or tenderness.  Neurological: He is alert and oriented to person, place, and time.  Skin: Skin is warm.  Psychiatric: Affect normal.     LABORATORY DATA:  I have reviewed the data as listed    Component Value Date/Time   NA 141 09/19/2018 1008   K 4.2 09/19/2018 1008   CL 103 09/19/2018 1008   CO2 22 09/19/2018 1008   GLUCOSE 106 (H) 09/19/2018 1008   GLUCOSE 91 09/26/2017 1051   BUN 35 (H) 09/19/2018 1008   CREATININE 1.21 09/19/2018 1008   CALCIUM 10.1 09/19/2018 1008   PROT 6.4 09/19/2018 1008   ALBUMIN 3.8 09/19/2018 1008   AST 34 09/19/2018 1008   AST 43 (H) 03/02/2018 0902   ALT 51 (H) 09/19/2018 1008   ALT 49 (H) 03/02/2018 0902   ALKPHOS 97 09/19/2018 1008   BILITOT 1.7 (H) 09/19/2018 1008   GFRNONAA 58 (L) 09/19/2018 1008   GFRAA 67 09/19/2018 1008    No results found for: SPEP, UPEP  Lab Results  Component Value Date   WBC 4.9 09/27/2018   NEUTROABS 4.2 09/27/2018   HGB 8.8 (L) 09/27/2018    HCT 27.1 (L) 09/27/2018   MCV 96.4 09/27/2018   PLT 70 (L) 09/27/2018      Chemistry      Component Value Date/Time   NA 141 09/19/2018 1008   K 4.2 09/19/2018 1008   CL 103 09/19/2018 1008   CO2 22 09/19/2018 1008   BUN 35 (H) 09/19/2018 1008   CREATININE 1.21 09/19/2018 1008      Component Value Date/Time   CALCIUM 10.1 09/19/2018 1008   ALKPHOS 97 09/19/2018 1008   AST 34 09/19/2018 1008   AST 43 (H) 03/02/2018 0902   ALT 51 (H) 09/19/2018 1008   ALT 49 (H) 03/02/2018 0902   BILITOT 1.7 (H) 09/19/2018 1008       ASSESSMENT & PLAN:   Other pancytopenia ([place]) # Pancytopenia- Asymptomatic. Today labs white count- 6/ hemoglobin 8.8 platelets of 70-secondary to cirrhosis and  portal hypertension splenomegaly STABLE.   #Anemia -worse hemoglobin 8.8 baseline around 10-no obvious GI bleed.  Iron studies pending today.  Proceed with Venofer x1 today.  Will call family regarding iron studies.  # Cirrhosis/portal hypertension- Question Karlene Lineman s/p Bx. follows up with Dr. Vandenheuvel Norris.  Stable.  Recommend small bowel evaluation/capsule study.  # MG-as per neurology status post plasma exchange; stable.  On prednisone.  #Recent UTI-status post antibiotics improved.  #Given the transportation issues-patient/family interested in following up closer home; which I think is reasonable.  However, they are willing to go to [facility] if issues become complicated.  # DISPOSITION: wil call re: Iron labs from today.  # Proceed with IV iron infusion today. # follow up in 3 months/MD/ labs- cbc/cmp//possible venofer-Dr.B  Addendum: Iron studies from the visit shows saturation 26% ferritin 133 hemoglobin 8.8.  Hold further IV iron infusions.  Anemia likely secondary to liver disease/chronic disease.  Cc; Dr.Addepalli/  Dr.Crissman.       Cammie Sickle, MD 10/10/2018 7:24 PM

## 2018-10-02 ENCOUNTER — Ambulatory Visit: Payer: Medicare Other | Admitting: Family Medicine

## 2018-10-04 ENCOUNTER — Other Ambulatory Visit: Payer: Self-pay | Admitting: Family Medicine

## 2018-10-04 DIAGNOSIS — I2510 Atherosclerotic heart disease of native coronary artery without angina pectoris: Secondary | ICD-10-CM

## 2018-10-04 DIAGNOSIS — E119 Type 2 diabetes mellitus without complications: Secondary | ICD-10-CM

## 2018-10-04 DIAGNOSIS — I2583 Coronary atherosclerosis due to lipid rich plaque: Secondary | ICD-10-CM

## 2018-10-04 DIAGNOSIS — E7800 Pure hypercholesterolemia, unspecified: Secondary | ICD-10-CM

## 2018-10-04 DIAGNOSIS — I10 Essential (primary) hypertension: Secondary | ICD-10-CM

## 2018-10-04 NOTE — Telephone Encounter (Signed)
Requested Prescriptions  Pending Prescriptions Disp Refills  . losartan (COZAAR) 100 MG tablet [Pharmacy Med Name: LOSARTAN 100MG  TABLETS] 90 tablet 0    Sig: TAKE 1 TABLET(100 MG) BY MOUTH DAILY     Cardiovascular:  Angiotensin Receptor Blockers Passed - 10/04/2018  6:04 AM      Passed - Cr in normal range and within 180 days    Creatinine, Ser  Date Value Ref Range Status  09/19/2018 1.21 0.76 - 1.27 mg/dL Final         Passed - K in normal range and within 180 days    Potassium  Date Value Ref Range Status  09/19/2018 4.2 3.5 - 5.2 mmol/L Final         Passed - Patient is not pregnant      Passed - Last BP in normal range    BP Readings from Last 1 Encounters:  09/27/18 116/63         Passed - Valid encounter within last 6 months    Recent Outpatient Visits          2 weeks ago Recurrent UTI   [facility] Volney American, PA-C   4 weeks ago Need for influenza vaccination   [facility] [name], MD   7 months ago Diabetes mellitus without complication [facility])   [place] Crissman, Jeannette How, MD   10 months ago Acute sinusitis, recurrence not specified, unspecified location   [facility] Crissman, Jeannette How, MD   10 months ago Acute sinusitis, recurrence not specified, unspecified location   [place], MD      Future Appointments            In 1 week McGowan, Gordan Payment [place]   In 4 months Crissman, Jeannette How, MD [facility], [place]   In 7 months  [employer], [place]   In 10 months McGowan, Gordan Payment [facility]         . glipiZIDE (GLUCOTROL) 5 MG tablet [Pharmacy Med Name: GLIPIZIDE 5MG  TABLETS] 90 tablet 0    Sig: TAKE 1 TABLET(5 MG) BY MOUTH DAILY BEFORE BREAKFAST     Endocrinology:  Diabetes - Sulfonylureas Passed - 10/04/2018  6:04 AM      Passed - HBA1C is between 0 and 7.9 and within  180 days    Hgb A1c MFr Bld  Date Value Ref Range Status  02/08/2017 6.7 (H) 4.8 - 5.6 % Final    Comment:             Pre-diabetes: 5.7 - 6.4          Diabetes: >6.4          Glycemic control for adults with diabetes: <7.0          Passed - Valid encounter within last 6 months    Recent Outpatient Visits          2 weeks ago Recurrent UTI   [facility] Volney American, [place]   4 weeks ago Need for influenza vaccination   [facility] [name], MD   7 months ago Diabetes mellitus without complication [facility])   [place] [facility]man, Jeannette How, MD   10 months ago Acute sinusitis, recurrence not specified, unspecified location   [facility], Jeannette How, MD   10 months ago Acute sinusitis, recurrence not specified, unspecified location  [place] Crissman, Jeannette How, MD      Future Appointments            In 1 week McGowan, Gordan Payment [place]   In 4 months Crissman, Jeannette How, MD [facility], [place]   In 7 months  [employer], [place]   In 10 months McGowan, Gordan Payment [facility]         . simvastatin (ZOCOR) 40 MG tablet [Pharmacy Med Name: SIMVASTATIN 40MG  TABLETS] 90 tablet 0    Sig: TAKE 1 TABLET(40 MG) BY MOUTH DAILY AT 6 PM     Cardiovascular:  Antilipid - Statins Passed - 10/04/2018  6:04 AM      Passed - Total Cholesterol in normal range and within 360 days    Cholesterol, Total  Date Value Ref Range Status  09/05/2018 142 100 - 199 mg/dL Final   Cholesterol Piccolo, Waived  Date Value Ref Range Status  03/02/2018 104 <200 mg/dL Final    Comment:                            Desirable                <200                         Borderline High      200- 239                         High                     >239          Passed - LDL in normal range and within 360 days    LDL Calculated  Date Value Ref Range  Status  09/05/2018 73 0 - 99 mg/dL Final         Passed - HDL in normal range and within 360 days    HDL  Date Value Ref Range Status  09/05/2018 46 >39 mg/dL Final         Passed - Triglycerides in normal range and within 360 days    Triglycerides  Date Value Ref Range Status  09/05/2018 114 0 - 149 mg/dL Final   Triglycerides Piccolo,Waived  Date Value Ref Range Status  03/02/2018 92 <150 mg/dL Final    Comment:                            Normal                   <150                         Borderline High     150 - 199                         High                200 - 499                         Very High                >499  Passed - Patient is not pregnant      Passed - Valid encounter within last 12 months    Recent Outpatient Visits          2 weeks ago Recurrent UTI   [facility] Merrie Roof [place], [place]   4 weeks ago Need for influenza vaccination   [facility] [name], MD   7 months ago Diabetes mellitus without complication [facility])   [facility]issman, Jeannette How, MD   10 months ago Acute sinusitis, recurrence not specified, unspecified location   [facility], Jeannette How, MD   10 months ago Acute sinusitis, recurrence not specified, unspecified location   [place], MD      Future Appointments            In 1 week McGowan, Gordan Payment [place]   In 4 months Crissman, Jeannette How, [facility], [place]   In 7 months  [employer], [place]   In 10 months McGowan, Gordan Payment [facility]

## 2018-10-09 ENCOUNTER — Encounter (INDEPENDENT_AMBULATORY_CARE_PROVIDER_SITE_OTHER): Payer: Self-pay

## 2018-10-09 ENCOUNTER — Ambulatory Visit
Admission: RE | Admit: 2018-10-09 | Discharge: 2018-10-09 | Disposition: A | Discharge status: Home / Self Care | Payer: Medicare Other | Source: Ambulatory Visit | Attending: Urology | Admitting: Urology

## 2018-10-09 DIAGNOSIS — N281 Cyst of kidney, acquired: Secondary | ICD-10-CM | POA: Diagnosis not present

## 2018-10-09 DIAGNOSIS — N200 Calculus of kidney: Secondary | ICD-10-CM | POA: Insufficient documentation

## 2018-10-16 ENCOUNTER — Encounter: Payer: Self-pay | Admitting: Urology

## 2018-10-16 ENCOUNTER — Other Ambulatory Visit: Payer: Self-pay

## 2018-10-16 ENCOUNTER — Ambulatory Visit
Admission: RE | Admit: 2018-10-16 | Discharge: 2018-10-16 | Disposition: A | Discharge status: Home / Self Care | Payer: Medicare Other | Source: Ambulatory Visit | Attending: Family Medicine | Admitting: Family Medicine

## 2018-10-16 ENCOUNTER — Ambulatory Visit (INDEPENDENT_AMBULATORY_CARE_PROVIDER_SITE_OTHER): Payer: Medicare Other | Admitting: Urology

## 2018-10-16 VITALS — BP 110/57 | HR 78 | Ht 69.0 in | Wt 244.0 lb

## 2018-10-16 DIAGNOSIS — I2583 Coronary atherosclerosis due to lipid rich plaque: Secondary | ICD-10-CM

## 2018-10-16 DIAGNOSIS — Z7952 Long term (current) use of systemic steroids: Secondary | ICD-10-CM | POA: Diagnosis not present

## 2018-10-16 DIAGNOSIS — N138 Other obstructive and reflux uropathy: Secondary | ICD-10-CM | POA: Diagnosis not present

## 2018-10-16 DIAGNOSIS — N401 Enlarged prostate with lower urinary tract symptoms: Secondary | ICD-10-CM | POA: Diagnosis not present

## 2018-10-16 DIAGNOSIS — I2510 Atherosclerotic heart disease of native coronary artery without angina pectoris: Secondary | ICD-10-CM | POA: Diagnosis not present

## 2018-10-16 DIAGNOSIS — G7000 Myasthenia gravis without (acute) exacerbation: Secondary | ICD-10-CM | POA: Insufficient documentation

## 2018-10-16 DIAGNOSIS — Z13820 Encounter for screening for osteoporosis: Secondary | ICD-10-CM | POA: Diagnosis not present

## 2018-10-16 DIAGNOSIS — Z87440 Personal history of urinary (tract) infections: Secondary | ICD-10-CM

## 2018-10-16 NOTE — Progress Notes (Signed)
10/16/2018 9:05 AM   Mason Wilson. Jan 18, 1941 [id number]  Referring provider: [name], MD [address] [place], [place] [address]  Chief Complaint  Patient presents with  . Benign Prostatic Hypertrophy    HPI: Patient is a 77 year old Caucasian male with a history of UTI, BPH with LU TS, bilateral nephrolithiasis and ED who presents to discuss renal ultrasound results with his wife, Mason Wilson.    History of rUTI's Risk factors: age, DM, fecal incontinence, limited water intake, sugary drinks and myasthenia gravis.    + E.coli resistant to ampicillin on 10/21/2017 + E.coli resistant to ampicillin on 11/02/2017 + E.coli resistant to ampicillin on 09/19/2018 Symptoms with an UTI: increase nocturia, dysuria, hight fever (103.2), malaise and urinating little amounts PVR's minimal  RUS on 10/09/2018 revealed No acute findings.  No hydronephrosis.  No visualized intrarenal stones.  2.8 cm lower pole right renal cyst.  No other masses.  He is drinking mostly diet cranberry juice, sweat tea, milk with cereal, three Pepsi zeros weekly, no coffee and no alcohol.  Little water.  He is also having ice cream at night.   BPH WITH LUTS  (prostate and/or bladder) IPSS score: 23/4 on  09/25/2018.  Previous score: 12/5   Previous PVR: 74 mL Major complaint(s): Urgency, urge incontinence, fecal incontinence and nocturia x several years. Denies any dysuria, hematuria or suprapubic pain.  Currently taking: tamsulosin 0.4 mg daily   Denies any recent fevers, chills, nausea or vomiting.    Risk factors for nocturia: obstructive sleep apnea, diabetes, heart disease, anxiety, depression and BPH.   Advised patient to eliminate dairy from diet and seek further care from GI for the fecal incontinence.    Bilateral nephrolithiasis Tiny punctate stones bilaterally on CT in 2016.  He has not had any flank pain or passage of fragments.    Erectile dysfunction Trimix has not been effective.  Not  interested in sex at thia time.  Has no energy due to iron issues.    PMH: Past Medical History:  Diagnosis Date  . Allergy   . Arthritis    "everywhere' - worse in back  . Body mass index 39.0-39.9, adult   . CAD (coronary artery disease)   . Chronic back pain   . Cirrhosis, non-alcoholic ([place])   . Diabetes mellitus without complication ([place])   . Elevated liver enzymes   . GERD (gastroesophageal reflux disease)   . Heart murmur   . Hyperlipidemia   . Hypertension   . Hypogonadism in male   . Melanoma ([place])    melanoma   . Melanoma in situ of right shoulder ([place])   . Myasthenia gravis ([place]) 01/2018  . Renal stones   . Sleep apnea    CPAP  . Uses hearing aid    doesn't wear    Surgical History: Past Surgical History:  Procedure Laterality Date  . ANGIOPLASTY  1999   3 stents  . CARDIAC CATHETERIZATION     stents placed  . CARPAL TUNNEL RELEASE     x2  . CATARACT EXTRACTION, BILATERAL    . COLONOSCOPY WITH PROPOFOL N/A 07/03/2018   Procedure: COLONOSCOPY WITH PROPOFOL;  Surgeon: Mason Lame, MD;  Location: [facility];  Service: Endoscopy;  Laterality: N/A;  . ESOPHAGOGASTRODUODENOSCOPY (EGD) WITH PROPOFOL N/A 09/08/2015   Procedure: ESOPHAGOGASTRODUODENOSCOPY (EGD) WITH PROPOFOL;  Surgeon: Mason Lame, MD;  Location: [facility];  Service: Endoscopy;  Laterality: N/A;  CPAP Diabetic - oral  meds  . ESOPHAGOGASTRODUODENOSCOPY (EGD) WITH PROPOFOL N/A 07/03/2018   Procedure: ESOPHAGOGASTRODUODENOSCOPY (EGD) WITH PROPOFOL;  Surgeon: Mason Lame, MD;  Location: [place];  Service: Endoscopy;  Laterality: N/A;  diabetic - oral meds sleep apnea  . LIVER BIOPSY    . POLYPECTOMY N/A 07/03/2018   Procedure: POLYPECTOMY;  Surgeon: Mason Lame, MD;  Location: [facility] [place];  Service: Endoscopy;  Laterality: N/A;  . SKIN LESION EXCISION  1998  . SKIN LESION EXCISION     skin cancer removed on forehead in June 2018  . TONSILLECTOMY     age 29     Home Medications:  Allergies as of 10/16/2018      Reactions   Altace [ramipril] Cough      Medication List        Accurate as of 10/16/18  9:05 AM. Always use your most recent med list.          CALCIUM 1200 PO Take 1,200 mg by mouth.   DIALYVITE VITAMIN D3 MAX 1.25 MG (50000 UT) Tabs Generic drug:  Cholecalciferol TK 1 T PO 1 TIME A WK FOR 8 WKS   glipiZIDE 5 MG tablet Commonly known as:  GLUCOTROL Take 1 tablet (5 mg total) by mouth daily before breakfast.   glipiZIDE 5 MG tablet Commonly known as:  GLUCOTROL TAKE 1 TABLET(5 MG) BY MOUTH DAILY BEFORE BREAKFAST   hydrochlorothiazide 25 MG tablet Commonly known as:  HYDRODIURIL Take 1 tablet (25 mg total) by mouth daily.   losartan 100 MG tablet Commonly known as:  COZAAR Take 1 tablet (100 mg total) by mouth daily.   losartan 100 MG tablet Commonly known as:  COZAAR TAKE 1 TABLET(100 MG) BY MOUTH DAILY   metFORMIN 500 MG tablet Commonly known as:  GLUCOPHAGE Take 2 tablets (1,000 mg total) by mouth 2 (two) times daily with a meal.   metoprolol succinate 50 MG 24 hr tablet Commonly known as:  TOPROL-XL Take 1 tablet (50 mg total) by mouth daily. Take with or immediately following a meal.   omeprazole 20 MG capsule Commonly known as:  PRILOSEC Take 20 mg by mouth daily. AM   predniSONE 20 MG tablet Commonly known as:  DELTASONE   pyridostigmine 60 MG tablet Commonly known as:  MESTINON Take 60 mg by mouth 4 (four) times daily.   simvastatin 40 MG tablet Commonly known as:  ZOCOR Take 1 tablet (40 mg total) by mouth daily at 6 PM.   simvastatin 40 MG tablet Commonly known as:  ZOCOR TAKE 1 TABLET(40 MG) BY MOUTH DAILY AT 6 PM   sulfamethoxazole-trimethoprim 800-160 MG tablet Commonly known as:  BACTRIM DS,SEPTRA DS Take 1 tablet by mouth 2 (two) times daily.   tamsulosin 0.4 MG Caps capsule Commonly known as:  FLOMAX TAKE 1 CAPSULE(0.4 MG) BY MOUTH DAILY   tiZANidine 4 MG tablet Commonly  known as:  ZANAFLEX Take 4 mg by mouth every 6 (six) hours as needed for muscle spasms.   traMADol 50 MG tablet Commonly known as:  ULTRAM Take 1 tablet (50 mg total) by mouth every 6 (six) hours as needed.       Allergies:  Allergies  Allergen Reactions  . Altace [Ramipril] Cough    Family History: Family History  Problem Relation Age of Onset  . Thyroid disease Mother   . Alzheimer's disease Mother   . Heart disease Mother   . Cancer Father 45       testicular  . Heart  disease Father   . Hypertension Father   . Heart attack Father   . Kidney disease Neg Hx   . Prostate cancer Neg Hx   . Kidney cancer Neg Hx   . Bladder Cancer Neg Hx     Social History:  reports that he quit smoking about 48 years ago. His smoking use included cigarettes. His smokeless tobacco use includes chew. He reports that he does not drink alcohol or use drugs.  ROS: UROLOGY Frequent Urination?: No Hard to postpone urination?: Yes Burning/pain with urination?: No Get up at night to urinate?: Yes Leakage of urine?: No Urine stream starts and stops?: No Trouble starting stream?: No Do you have to strain to urinate?: No Blood in urine?: No Urinary tract infection?: No Sexually transmitted disease?: No Injury to kidneys or bladder?: No Painful intercourse?: No Weak stream?: No Erection problems?: Yes Penile pain?: No  Gastrointestinal Nausea?: No Vomiting?: No Indigestion/heartburn?: No Diarrhea?: No Constipation?: No  Constitutional Fever: No Night sweats?: No Weight loss?: No Fatigue?: Yes  Skin Skin rash/lesions?: No Itching?: No  Eyes Blurred vision?: No Double vision?: No  Ears/Nose/Throat Sore throat?: No Sinus problems?: No  Hematologic/Lymphatic Swollen glands?: No Easy bruising?: No  Cardiovascular Leg swelling?: No Chest pain?: No  Respiratory Cough?: No Shortness of breath?: No  Endocrine Excessive thirst?: No  Musculoskeletal Back pain?:  Yes Joint pain?: No  Neurological Headaches?: No Dizziness?: No  Psychologic Depression?: No Anxiety?: No  Physical Exam: BP (!) 110/57   Pulse 78   Ht 5\' 9"  (1.753 m)   Wt 244 lb (110.7 kg)   BMI 36.03 kg/m   Constitutional: Well nourished. Alert and oriented, No acute distress. HEENT: [place] AT, moist mucus membranes. Trachea midline, no masses. Cardiovascular: No clubbing, cyanosis, or edema. Respiratory: Normal respiratory effort, no increased work of breathing. Skin: No rashes, bruises or suspicious lesions. Lymph: No cervical or inguinal adenopathy. Neurologic: Grossly intact, no focal deficits, moving all 4 extremities. Psychiatric: Normal mood and affect.  Laboratory Data: PSA History  1.6 ng/mL in 07/2015  1.8 ng/mL in 07/2016  1.3 ng/mL in 07/2017  2.1 ng/mL in 07/2018  Lab Results  Component Value Date   CREATININE 1.21 09/19/2018       Component Value Date/Time   CHOL 142 09/05/2018 1057   CHOL 104 03/02/2018 0902   HDL 46 09/05/2018 1057   CHOLHDL 3.1 09/05/2018 1057   VLDL 18 03/02/2018 0902   LDLCALC 73 09/05/2018 1057    Lab Results  Component Value Date   AST 34 09/19/2018   Lab Results  Component Value Date   ALT 51 (H) 09/19/2018   I have reviewed the labs  Pertinent Imaging CLINICAL DATA:  Nephrolithiasis.  EXAM: RENAL / URINARY TRACT ULTRASOUND COMPLETE  COMPARISON:  CT, 08/01/2015  FINDINGS: Right Kidney:  Renal measurements: 13.3 x 6.7 x 5.6 cm = volume: 262 mL. Normal parenchymal echogenicity. 2.8 cm x 2.2 cm x 1.6 cm cyst projects from the posterolateral lower pole. No other renal masses, no stones and no hydronephrosis.  Left Kidney:  Renal measurements: 12.6 x 5.7 x 5.4 cm = volume: 202 mL. Echogenicity within normal limits. No mass or hydronephrosis visualized.  Bladder:  Appears normal for degree of bladder distention.  IMPRESSION: 1. No acute findings.  No hydronephrosis. 2. No visualized  intrarenal stones. 3. 2.8 cm lower pole right renal cyst.  No other masses.   Electronically Signed   By: Lajean Manes M.D.   On:  10/09/2018 14:21 I have independently reviewed the films and no nidus for infection was seen.    Assessment & Plan:    1. History of rUTI's RUS did not identify a nidus for infection Discussed further workup regarding bladder function and anatomy and suggested starting with cystoscopy I have explained to the patient that they will  be scheduled for a cystoscopy in our office to evaluate their bladder.  The cystoscopy consists of passing a tube with a lens up through their urethra and into their urinary bladder.   We will inject the urethra with a lidocaine gel prior to introducing the cystoscope to help with any discomfort during the procedure.   After the procedure, they might experience blood in the urine and discomfort with urination.  This will abate after the first few voids.  I have  encouraged the patient to increase water intake  during this time.  Patient denies any allergies to lidocaine.  If cystoscopy is normal, I discussed possible further investigation with UDS We also discussed changing from sweet tea, diet soda's and other diet drinks to mostly water and drinking one glass of cranberry juice daily I also encouraged the patient to avoid dairy and seek further evaluation with GI regarding his fecal incontinence  2. BPH with LUTS IPSS score is 23/4, it is worsening Continue conservative management, avoiding bladder irritants and timed voiding's Most bothersome symptoms is/are nocturia  Continue tamsulosin 0.4 mg daily RTC for cysto   3. Bilateral nephrolithiasis Punctate stones seen on 2016 CT No flank pain or passage of fragments  4. Erectile dysfunction Not interested in treatment    Return for cystoscopy for r UTI's .  These notes generated with voice recognition software. I apologize for typographical errors.  Zara Council,  PA-C  [facility] [address] [place], [place] [address] [phone]

## 2018-10-17 ENCOUNTER — Telehealth: Payer: Self-pay | Admitting: Internal Medicine

## 2018-10-17 NOTE — Telephone Encounter (Signed)
Mychart message sent.

## 2018-11-03 ENCOUNTER — Other Ambulatory Visit: Payer: Self-pay | Admitting: Family Medicine

## 2018-11-03 DIAGNOSIS — I10 Essential (primary) hypertension: Secondary | ICD-10-CM

## 2018-11-03 DIAGNOSIS — E119 Type 2 diabetes mellitus without complications: Secondary | ICD-10-CM

## 2018-11-09 ENCOUNTER — Telehealth: Payer: Self-pay | Admitting: Urology

## 2018-11-09 NOTE — Telephone Encounter (Signed)
Spoke with patients wife and advised that an antibiotic would not be given without a UA. Advised that patient would need to come in and give a urine sample. Patients wife stated she did not give him AZO today and was advised to continue without it as they will come tomorrow 11/10/18 for a urine.

## 2018-11-09 NOTE — Telephone Encounter (Signed)
Pt's wife Jenny Reichmann called and wants to know if he can get RX called in for UTI.  He has been taking AZO and it's not helping at all.  He has an appt for cysto w/Brandon on 1/24.  Pt has burning, frequent urination and when he pees, it's just a drop or two.  They are out of town in [place] and would need it called in to [facility] in [place] [phone]  [place] phone# [phone]

## 2018-11-10 ENCOUNTER — Ambulatory Visit: Payer: Medicare Other

## 2018-11-10 ENCOUNTER — Telehealth: Payer: Self-pay | Admitting: Urology

## 2018-11-10 ENCOUNTER — Telehealth: Payer: Self-pay

## 2018-11-10 VITALS — BP 116/69 | HR 80 | Ht 69.5 in | Wt 230.0 lb

## 2018-11-10 DIAGNOSIS — N390 Urinary tract infection, site not specified: Secondary | ICD-10-CM

## 2018-11-10 DIAGNOSIS — R300 Dysuria: Secondary | ICD-10-CM | POA: Diagnosis not present

## 2018-11-10 LAB — URINALYSIS, COMPLETE
Bilirubin, UA: NEGATIVE
Ketones, UA: NEGATIVE
Nitrite, UA: POSITIVE — AB
Protein, UA: NEGATIVE
Specific Gravity, UA: 1.01 (ref 1.005–1.030)
Urobilinogen, Ur: 0.2 mg/dL (ref 0.2–1.0)
pH, UA: 5 (ref 5.0–7.5)

## 2018-11-10 LAB — MICROSCOPIC EXAMINATION: WBC, UA: >30 /hpf — ABNORMAL HIGH (ref 0–5)

## 2018-11-10 MED ORDER — SULFAMETHOXAZOLE-TRIMETHOPRIM 800-160 MG PO TABS: 1.0000 | ORAL_TABLET | Freq: Two times a day (BID) | ORAL | 0 refills | Status: DC

## 2018-11-10 NOTE — Telephone Encounter (Signed)
Patient's wife called back and she was notified about the [place]

## 2018-11-10 NOTE — Progress Notes (Signed)
Patient present today complaining of urine frequency, mostly at night, having to get up every 1-2 hours, with burning. Symptoms going on for about two weeks per patient. Advised patient that we will call with provider response and results from culture.

## 2018-11-10 NOTE — Telephone Encounter (Signed)
Called patient to advise that Septra was sent to [facility] on corner of [address]. Left message to return call.

## 2018-11-13 LAB — CULTURE, URINE COMPREHENSIVE

## 2018-11-14 NOTE — Telephone Encounter (Signed)
Okay.  I want to make sure that he is not having fevers or chills.  We can switch to the Keflex 500 mg BID for seven days.

## 2018-11-14 NOTE — Telephone Encounter (Signed)
When [name] says the antibiotic is not working, what symptoms is he still having?

## 2018-11-14 NOTE — Telephone Encounter (Signed)
Patient says he is still burning right before and after urination. Urinary frequency at night.  Stream is better.  Patient has been on medication for 5 days now and has no relief.  Please advise.

## 2018-11-14 NOTE — Telephone Encounter (Signed)
Patient is currently on Spetra and culture states will work to treat should he continue the course or try something else? thanks

## 2018-11-14 NOTE — Telephone Encounter (Signed)
Patient's wife called back and said that the ABX he was put on for the UTI is not working, he still has the symptoms and they are not getting any better. Please have someone contact the patient to discuss.   Sharyn Lull

## 2018-11-15 MED ORDER — CEPHALEXIN 500 MG PO CAPS: 500.0000 mg | ORAL_CAPSULE | Freq: Two times a day (BID) | ORAL | 0 refills | Status: DC

## 2018-11-15 NOTE — Telephone Encounter (Signed)
Pt wife Mason Wilson stating she left message yesterday stating her husband is not getting any better, Jenny Reichmann (wife) states she did not hear back from anyone yesterday or so far this morning and is following up on her call for advice yesterday. Please advise Cindy at [phone]. Thanks

## 2018-11-15 NOTE — Telephone Encounter (Signed)
Called patient. He states he is not having chills and does not have a fever. Would like to try Keflex. Put in Keflex to pharmacy per Shannon's note and pt request.

## 2018-11-26 ENCOUNTER — Encounter: Payer: Self-pay | Admitting: Emergency Medicine

## 2018-11-26 ENCOUNTER — Emergency Department: Payer: Medicare Other

## 2018-11-26 ENCOUNTER — Inpatient Hospital Stay: Payer: Medicare Other | Admitting: Certified Registered Nurse Anesthetist

## 2018-11-26 ENCOUNTER — Inpatient Hospital Stay: Payer: Medicare Other

## 2018-11-26 ENCOUNTER — Other Ambulatory Visit: Payer: Self-pay

## 2018-11-26 ENCOUNTER — Encounter
Admission: EM | Disposition: A | Discharge status: Home Health | Payer: Self-pay | Source: Home / Self Care | Attending: Family Medicine

## 2018-11-26 ENCOUNTER — Inpatient Hospital Stay
Admission: EM | Admit: 2018-11-26 | Discharge: 2018-11-30 | DRG: 871 | Disposition: A | Discharge status: Home Health | Payer: Medicare Other | Attending: Internal Medicine | Admitting: Internal Medicine

## 2018-11-26 DIAGNOSIS — J9690 Respiratory failure, unspecified, unspecified whether with hypoxia or hypercapnia: Secondary | ICD-10-CM

## 2018-11-26 DIAGNOSIS — A4151 Sepsis due to Escherichia coli [E. coli]: Principal | ICD-10-CM | POA: Diagnosis present

## 2018-11-26 DIAGNOSIS — Z7952 Long term (current) use of systemic steroids: Secondary | ICD-10-CM

## 2018-11-26 DIAGNOSIS — N390 Urinary tract infection, site not specified: Secondary | ICD-10-CM | POA: Diagnosis present

## 2018-11-26 DIAGNOSIS — G4730 Sleep apnea, unspecified: Secondary | ICD-10-CM | POA: Diagnosis not present

## 2018-11-26 DIAGNOSIS — R918 Other nonspecific abnormal finding of lung field: Secondary | ICD-10-CM | POA: Diagnosis not present

## 2018-11-26 DIAGNOSIS — Z9911 Dependence on respirator [ventilator] status: Secondary | ICD-10-CM | POA: Diagnosis not present

## 2018-11-26 DIAGNOSIS — I129 Hypertensive chronic kidney disease with stage 1 through stage 4 chronic kidney disease, or unspecified chronic kidney disease: Secondary | ICD-10-CM | POA: Diagnosis not present

## 2018-11-26 DIAGNOSIS — E1122 Type 2 diabetes mellitus with diabetic chronic kidney disease: Secondary | ICD-10-CM | POA: Diagnosis not present

## 2018-11-26 DIAGNOSIS — E785 Hyperlipidemia, unspecified: Secondary | ICD-10-CM | POA: Diagnosis present

## 2018-11-26 DIAGNOSIS — J811 Chronic pulmonary edema: Secondary | ICD-10-CM | POA: Diagnosis not present

## 2018-11-26 DIAGNOSIS — Z4682 Encounter for fitting and adjustment of non-vascular catheter: Secondary | ICD-10-CM | POA: Diagnosis not present

## 2018-11-26 DIAGNOSIS — J9601 Acute respiratory failure with hypoxia: Secondary | ICD-10-CM | POA: Diagnosis present

## 2018-11-26 DIAGNOSIS — G7000 Myasthenia gravis without (acute) exacerbation: Secondary | ICD-10-CM | POA: Diagnosis present

## 2018-11-26 DIAGNOSIS — A419 Sepsis, unspecified organism: Secondary | ICD-10-CM | POA: Diagnosis present

## 2018-11-26 DIAGNOSIS — R6520 Severe sepsis without septic shock: Secondary | ICD-10-CM | POA: Diagnosis not present

## 2018-11-26 DIAGNOSIS — R0902 Hypoxemia: Secondary | ICD-10-CM | POA: Diagnosis not present

## 2018-11-26 DIAGNOSIS — R001 Bradycardia, unspecified: Secondary | ICD-10-CM | POA: Diagnosis not present

## 2018-11-26 DIAGNOSIS — Z452 Encounter for adjustment and management of vascular access device: Secondary | ICD-10-CM | POA: Diagnosis not present

## 2018-11-26 DIAGNOSIS — J9602 Acute respiratory failure with hypercapnia: Secondary | ICD-10-CM | POA: Diagnosis present

## 2018-11-26 DIAGNOSIS — N179 Acute kidney failure, unspecified: Secondary | ICD-10-CM | POA: Diagnosis present

## 2018-11-26 DIAGNOSIS — K859 Acute pancreatitis without necrosis or infection, unspecified: Secondary | ICD-10-CM | POA: Diagnosis present

## 2018-11-26 DIAGNOSIS — R6521 Severe sepsis with septic shock: Secondary | ICD-10-CM | POA: Diagnosis present

## 2018-11-26 DIAGNOSIS — Z7984 Long term (current) use of oral hypoglycemic drugs: Secondary | ICD-10-CM

## 2018-11-26 DIAGNOSIS — R000 Tachycardia, unspecified: Secondary | ICD-10-CM | POA: Diagnosis not present

## 2018-11-26 DIAGNOSIS — R197 Diarrhea, unspecified: Secondary | ICD-10-CM | POA: Diagnosis present

## 2018-11-26 DIAGNOSIS — I2510 Atherosclerotic heart disease of native coronary artery without angina pectoris: Secondary | ICD-10-CM | POA: Diagnosis present

## 2018-11-26 DIAGNOSIS — K7460 Unspecified cirrhosis of liver: Secondary | ICD-10-CM | POA: Diagnosis not present

## 2018-11-26 DIAGNOSIS — E119 Type 2 diabetes mellitus without complications: Secondary | ICD-10-CM | POA: Diagnosis present

## 2018-11-26 DIAGNOSIS — I10 Essential (primary) hypertension: Secondary | ICD-10-CM | POA: Diagnosis present

## 2018-11-26 DIAGNOSIS — Z85820 Personal history of malignant melanoma of skin: Secondary | ICD-10-CM

## 2018-11-26 DIAGNOSIS — K3580 Unspecified acute appendicitis: Secondary | ICD-10-CM | POA: Diagnosis not present

## 2018-11-26 DIAGNOSIS — D61818 Other pancytopenia: Secondary | ICD-10-CM | POA: Diagnosis present

## 2018-11-26 DIAGNOSIS — K625 Hemorrhage of anus and rectum: Secondary | ICD-10-CM | POA: Diagnosis present

## 2018-11-26 DIAGNOSIS — D696 Thrombocytopenia, unspecified: Secondary | ICD-10-CM | POA: Diagnosis not present

## 2018-11-26 DIAGNOSIS — Z955 Presence of coronary angioplasty implant and graft: Secondary | ICD-10-CM

## 2018-11-26 DIAGNOSIS — R58 Hemorrhage, not elsewhere classified: Secondary | ICD-10-CM | POA: Diagnosis not present

## 2018-11-26 DIAGNOSIS — Z79899 Other long term (current) drug therapy: Secondary | ICD-10-CM

## 2018-11-26 DIAGNOSIS — N410 Acute prostatitis: Secondary | ICD-10-CM | POA: Diagnosis present

## 2018-11-26 DIAGNOSIS — R161 Splenomegaly, not elsewhere classified: Secondary | ICD-10-CM | POA: Diagnosis present

## 2018-11-26 DIAGNOSIS — R0689 Other abnormalities of breathing: Secondary | ICD-10-CM | POA: Diagnosis not present

## 2018-11-26 DIAGNOSIS — Z87891 Personal history of nicotine dependence: Secondary | ICD-10-CM

## 2018-11-26 DIAGNOSIS — J181 Lobar pneumonia, unspecified organism: Secondary | ICD-10-CM | POA: Diagnosis not present

## 2018-11-26 DIAGNOSIS — K219 Gastro-esophageal reflux disease without esophagitis: Secondary | ICD-10-CM | POA: Diagnosis present

## 2018-11-26 DIAGNOSIS — Z86010 Personal history of colonic polyps: Secondary | ICD-10-CM

## 2018-11-26 DIAGNOSIS — B961 Klebsiella pneumoniae [K. pneumoniae] as the cause of diseases classified elsewhere: Secondary | ICD-10-CM | POA: Diagnosis present

## 2018-11-26 DIAGNOSIS — N412 Abscess of prostate: Secondary | ICD-10-CM

## 2018-11-26 DIAGNOSIS — N189 Chronic kidney disease, unspecified: Secondary | ICD-10-CM | POA: Diagnosis not present

## 2018-11-26 DIAGNOSIS — J189 Pneumonia, unspecified organism: Secondary | ICD-10-CM | POA: Diagnosis present

## 2018-11-26 DIAGNOSIS — J80 Acute respiratory distress syndrome: Secondary | ICD-10-CM | POA: Diagnosis not present

## 2018-11-26 DIAGNOSIS — Z87440 Personal history of urinary (tract) infections: Secondary | ICD-10-CM

## 2018-11-26 DIAGNOSIS — E291 Testicular hypofunction: Secondary | ICD-10-CM | POA: Diagnosis not present

## 2018-11-26 DIAGNOSIS — Z888 Allergy status to other drugs, medicaments and biological substances status: Secondary | ICD-10-CM

## 2018-11-26 DIAGNOSIS — R7881 Bacteremia: Secondary | ICD-10-CM | POA: Diagnosis not present

## 2018-11-26 DIAGNOSIS — Z8249 Family history of ischemic heart disease and other diseases of the circulatory system: Secondary | ICD-10-CM

## 2018-11-26 DIAGNOSIS — J9600 Acute respiratory failure, unspecified whether with hypoxia or hypercapnia: Secondary | ICD-10-CM | POA: Diagnosis not present

## 2018-11-26 HISTORY — PX: TRANSURETHRAL RESECTION OF PROSTATE: SHX73

## 2018-11-26 LAB — COMPREHENSIVE METABOLIC PANEL
ALT: 100 U/L — ABNORMAL HIGH (ref 0–44)
ALT: 77 U/L — ABNORMAL HIGH (ref 0–44)
ALT: 74 U/L — ABNORMAL HIGH (ref 0–44)
AST: 43 U/L — ABNORMAL HIGH (ref 15–41)
AST: 32 U/L (ref 15–41)
AST: 29 U/L (ref 15–41)
Albumin: 3.1 g/dL — ABNORMAL LOW (ref 3.5–5.0)
Albumin: 2.6 g/dL — ABNORMAL LOW (ref 3.5–5.0)
Albumin: 2.8 g/dL — ABNORMAL LOW (ref 3.5–5.0)
Alkaline Phosphatase: 113 U/L (ref 38–126)
Alkaline Phosphatase: 77 U/L (ref 38–126)
Alkaline Phosphatase: 77 U/L (ref 38–126)
Anion gap: 12 (ref 5–15)
Anion gap: 6 (ref 5–15)
Anion gap: 8 (ref 5–15)
BUN: 63 mg/dL — ABNORMAL HIGH (ref 8–23)
BUN: 57 mg/dL — ABNORMAL HIGH (ref 8–23)
BUN: 57 mg/dL — ABNORMAL HIGH (ref 8–23)
CHLORIDE: 111 mmol/L (ref 98–111)
CHLORIDE: 115 mmol/L — AB (ref 98–111)
CO2: 18 mmol/L — ABNORMAL LOW (ref 22–32)
CO2: 20 mmol/L — ABNORMAL LOW (ref 22–32)
CO2: 18 mmol/L — ABNORMAL LOW (ref 22–32)
CREATININE: 1.6 mg/dL — AB (ref 0.61–1.24)
CREATININE: 1.31 mg/dL — AB (ref 0.61–1.24)
Calcium: 8.9 mg/dL (ref 8.9–10.3)
Calcium: 7.7 mg/dL — ABNORMAL LOW (ref 8.9–10.3)
Calcium: 7.7 mg/dL — ABNORMAL LOW (ref 8.9–10.3)
Chloride: 113 mmol/L — ABNORMAL HIGH (ref 98–111)
Creatinine, Ser: 1.08 mg/dL (ref 0.61–1.24)
GFR calc Af Amer: 48 mL/min — ABNORMAL LOW (ref >60)
GFR calc Af Amer: >60 mL/min (ref >60)
GFR calc Af Amer: >60 mL/min (ref >60)
GFR calc non Af Amer: 41 mL/min — ABNORMAL LOW (ref >60)
GFR calc non Af Amer: 53 mL/min — ABNORMAL LOW (ref >60)
GFR calc non Af Amer: >60 mL/min (ref >60)
Glucose, Bld: 126 mg/dL — ABNORMAL HIGH (ref 70–99)
Glucose, Bld: 230 mg/dL — ABNORMAL HIGH (ref 70–99)
Glucose, Bld: 312 mg/dL — ABNORMAL HIGH (ref 70–99)
Potassium: 3.3 mmol/L — ABNORMAL LOW (ref 3.5–5.1)
Potassium: 4.9 mmol/L (ref 3.5–5.1)
Potassium: 5.1 mmol/L (ref 3.5–5.1)
SODIUM: 141 mmol/L (ref 135–145)
Sodium: 141 mmol/L (ref 135–145)
Sodium: 139 mmol/L (ref 135–145)
Total Bilirubin: 3.4 mg/dL — ABNORMAL HIGH (ref 0.3–1.2)
Total Bilirubin: 3.8 mg/dL — ABNORMAL HIGH (ref 0.3–1.2)
Total Bilirubin: 3.6 mg/dL — ABNORMAL HIGH (ref 0.3–1.2)
Total Protein: 6.3 g/dL — ABNORMAL LOW (ref 6.5–8.1)
Total Protein: 5.4 g/dL — ABNORMAL LOW (ref 6.5–8.1)
Total Protein: 5.4 g/dL — ABNORMAL LOW (ref 6.5–8.1)

## 2018-11-26 LAB — BLOOD CULTURE ID PANEL (REFLEXED)
Acinetobacter baumannii: NOT DETECTED
CARBAPENEM RESISTANCE: NOT DETECTED
Candida albicans: NOT DETECTED
Candida glabrata: NOT DETECTED
Candida krusei: NOT DETECTED
Candida parapsilosis: NOT DETECTED
Candida tropicalis: NOT DETECTED
Enterobacter cloacae complex: NOT DETECTED
Enterobacteriaceae species: DETECTED — AB
Enterococcus species: NOT DETECTED
Escherichia coli: DETECTED — AB
Haemophilus influenzae: NOT DETECTED
Klebsiella oxytoca: NOT DETECTED
Klebsiella pneumoniae: NOT DETECTED
Listeria monocytogenes: NOT DETECTED
Neisseria meningitidis: NOT DETECTED
Proteus species: NOT DETECTED
Pseudomonas aeruginosa: NOT DETECTED
STAPHYLOCOCCUS AUREUS BCID: NOT DETECTED
STREPTOCOCCUS PYOGENES: NOT DETECTED
Serratia marcescens: NOT DETECTED
Staphylococcus species: NOT DETECTED
Streptococcus agalactiae: NOT DETECTED
Streptococcus pneumoniae: NOT DETECTED
Streptococcus species: NOT DETECTED

## 2018-11-26 LAB — MRSA PCR SCREENING: MRSA by PCR: NEGATIVE

## 2018-11-26 LAB — BLOOD GAS, ARTERIAL
ACID-BASE DEFICIT: 5.5 mmol/L — AB (ref 0.0–2.0)
Acid-base deficit: 6.7 mmol/L — ABNORMAL HIGH (ref 0.0–2.0)
Bicarbonate: 19.2 mmol/L — ABNORMAL LOW (ref 20.0–28.0)
Bicarbonate: 20.7 mmol/L (ref 20.0–28.0)
FIO2: 0.4
FIO2: 30
MECHVT: 500 mL
MECHVT: 500 mL
Mechanical Rate: 20
Mechanical Rate: 20
O2 Saturation: 94.1 %
O2 Saturation: 96.7 %
PCO2 ART: 39 mmHg (ref 32.0–48.0)
PEEP: 5 cmH2O
PEEP: 5 cmH2O
PH ART: 7.3 — AB (ref 7.350–7.450)
Patient temperature: 37
Patient temperature: 37
pCO2 arterial: 42 mmHg (ref 32.0–48.0)
pH, Arterial: 7.3 — ABNORMAL LOW (ref 7.350–7.450)
pO2, Arterial: 79 mmHg — ABNORMAL LOW (ref 83.0–108.0)
pO2, Arterial: 96 mmHg (ref 83.0–108.0)

## 2018-11-26 LAB — TROPONIN I
Troponin I: 0.06 ng/mL (ref <0.03)
Troponin I: 0.5 ng/mL (ref <0.03)
Troponin I: 0.37 ng/mL (ref <0.03)

## 2018-11-26 LAB — URINALYSIS, ROUTINE W REFLEX MICROSCOPIC
Bilirubin Urine: NEGATIVE
GLUCOSE, UA: NEGATIVE mg/dL
HGB URINE DIPSTICK: NEGATIVE
Ketones, ur: NEGATIVE mg/dL
NITRITE: NEGATIVE
PROTEIN: 30 mg/dL — AB
Specific Gravity, Urine: 1.021 (ref 1.005–1.030)
WBC, UA: >50 WBC/hpf — ABNORMAL HIGH (ref 0–5)
pH: 5 (ref 5.0–8.0)

## 2018-11-26 LAB — CBC
HCT: 28.5 % — ABNORMAL LOW (ref 39.0–52.0)
Hemoglobin: 9.2 g/dL — ABNORMAL LOW (ref 13.0–17.0)
MCH: 31 pg (ref 26.0–34.0)
MCHC: 32.3 g/dL (ref 30.0–36.0)
MCV: 96 fL (ref 80.0–100.0)
Platelets: 65 10*3/uL — ABNORMAL LOW (ref 150–400)
RBC: 2.97 MIL/uL — ABNORMAL LOW (ref 4.22–5.81)
RDW: 15.9 % — ABNORMAL HIGH (ref 11.5–15.5)
WBC: 10.9 10*3/uL — ABNORMAL HIGH (ref 4.0–10.5)
nRBC: 0 % (ref 0.0–0.2)

## 2018-11-26 LAB — HEMOGLOBIN AND HEMATOCRIT, BLOOD
HEMATOCRIT: 28.1 % — AB (ref 39.0–52.0)
Hemoglobin: 9 g/dL — ABNORMAL LOW (ref 13.0–17.0)

## 2018-11-26 LAB — PHOSPHORUS: Phosphorus: 4.2 mg/dL (ref 2.5–4.6)

## 2018-11-26 LAB — CBC WITH DIFFERENTIAL/PLATELET
Abs Immature Granulocytes: 0.01 10*3/uL (ref 0.00–0.07)
Basophils Absolute: 0 10*3/uL (ref 0.0–0.1)
Basophils Relative: 0 %
Eosinophils Absolute: 0 10*3/uL (ref 0.0–0.5)
Eosinophils Relative: 0 %
HCT: 29.8 % — ABNORMAL LOW (ref 39.0–52.0)
Hemoglobin: 9.7 g/dL — ABNORMAL LOW (ref 13.0–17.0)
Immature Granulocytes: 1 %
Lymphocytes Relative: 6 %
Lymphs Abs: 0.1 10*3/uL — ABNORMAL LOW (ref 0.7–4.0)
MCH: 30.5 pg (ref 26.0–34.0)
MCHC: 32.6 g/dL (ref 30.0–36.0)
MCV: 93.7 fL (ref 80.0–100.0)
MONOS PCT: 1 %
Monocytes Absolute: 0 10*3/uL — ABNORMAL LOW (ref 0.1–1.0)
Neutro Abs: 0.7 10*3/uL — ABNORMAL LOW (ref 1.7–7.7)
Neutrophils Relative %: 92 %
Platelets: 64 10*3/uL — ABNORMAL LOW (ref 150–400)
RBC: 3.18 MIL/uL — ABNORMAL LOW (ref 4.22–5.81)
RDW: 15.2 % (ref 11.5–15.5)
Smear Review: NORMAL
WBC: 0.8 10*3/uL — CL (ref 4.0–10.5)
nRBC: 0 % (ref 0.0–0.2)

## 2018-11-26 LAB — PROCALCITONIN: Procalcitonin: 20.47 ng/mL

## 2018-11-26 LAB — LACTIC ACID, PLASMA
Lactic Acid, Venous: 5.8 mmol/L (ref 0.5–1.9)
Lactic Acid, Venous: 2.8 mmol/L (ref 0.5–1.9)
Lactic Acid, Venous: 1.7 mmol/L (ref 0.5–1.9)

## 2018-11-26 LAB — PROTIME-INR
INR: 1.27
INR: 1.4
Prothrombin Time: 15.8 seconds — ABNORMAL HIGH (ref 11.4–15.2)
Prothrombin Time: 17 seconds — ABNORMAL HIGH (ref 11.4–15.2)

## 2018-11-26 LAB — FIBRIN DERIVATIVES D-DIMER (ARMC ONLY): Fibrin derivatives D-dimer (ARMC): 4760 ng/mL (FEU) — ABNORMAL HIGH (ref 0.00–499.00)

## 2018-11-26 LAB — TYPE AND SCREEN
ABO/RH(D): AB NEG
Antibody Screen: NEGATIVE

## 2018-11-26 LAB — INFLUENZA PANEL BY PCR (TYPE A & B)
Influenza A By PCR: NEGATIVE
Influenza B By PCR: NEGATIVE

## 2018-11-26 LAB — GLUCOSE, CAPILLARY
Glucose-Capillary: 160 mg/dL — ABNORMAL HIGH (ref 70–99)
Glucose-Capillary: 326 mg/dL — ABNORMAL HIGH (ref 70–99)

## 2018-11-26 LAB — MAGNESIUM: Magnesium: 1.9 mg/dL (ref 1.7–2.4)

## 2018-11-26 LAB — LIPASE, BLOOD: Lipase: 74 U/L — ABNORMAL HIGH (ref 11–51)

## 2018-11-26 LAB — FIBRINOGEN: Fibrinogen: 467 mg/dL (ref 210–475)

## 2018-11-26 LAB — CORTISOL: Cortisol, Plasma: 23 ug/dL

## 2018-11-26 SURGERY — TURP (TRANSURETHRAL RESECTION OF PROSTATE)
Anesthesia: General

## 2018-11-26 MED ORDER — VASOPRESSIN 20 UNIT/ML IV SOLN
0.0300 [IU]/min | INTRAVENOUS | Status: DC
  Administered 2018-11-26: 0.03 [IU]/min via INTRAVENOUS
  Filled 2018-11-26 (×2): qty 2

## 2018-11-26 MED ORDER — SODIUM CHLORIDE 0.9 % IV SOLN
2.0000 g | Freq: Once | INTRAVENOUS | Status: AC
  Administered 2018-11-26: 2 g via INTRAVENOUS
  Filled 2018-11-26: qty 2

## 2018-11-26 MED ORDER — SODIUM CHLORIDE 0.9 % IV BOLUS
2000.0000 mL | Freq: Once | INTRAVENOUS | Status: AC
  Administered 2018-11-26: 2000 mL via INTRAVENOUS

## 2018-11-26 MED ORDER — MIDAZOLAM HCL 2 MG/2ML IJ SOLN: 2.0000 mg | INTRAMUSCULAR | Status: DC | PRN

## 2018-11-26 MED ORDER — MIDAZOLAM HCL 2 MG/2ML IJ SOLN
2.0000 mg | INTRAMUSCULAR | Status: DC | PRN
  Administered 2018-11-26: 2 mg via INTRAVENOUS
  Administered 2018-11-27: 4 mg via INTRAVENOUS
  Administered 2018-11-27: 2 mg via INTRAVENOUS
  Administered 2018-11-27: 4 mg via INTRAVENOUS
  Administered 2018-11-27: 2 mg via INTRAVENOUS
  Administered 2018-11-27: 4 mg via INTRAVENOUS
  Filled 2018-11-26: qty 2
  Filled 2018-11-26 (×3): qty 4
  Filled 2018-11-26 (×2): qty 2

## 2018-11-26 MED ORDER — SODIUM CHLORIDE 0.9 % IV BOLUS
1000.0000 mL | Freq: Once | INTRAVENOUS | Status: AC
  Administered 2018-11-26: 1000 mL via INTRAVENOUS

## 2018-11-26 MED ORDER — IBUPROFEN 600 MG PO TABS
600.0000 mg | ORAL_TABLET | Freq: Once | ORAL | Status: AC
  Administered 2018-11-26: 600 mg via ORAL
  Filled 2018-11-26: qty 1

## 2018-11-26 MED ORDER — FENTANYL CITRATE (PF) 100 MCG/2ML IJ SOLN
75.0000 ug | Freq: Once | INTRAMUSCULAR | Status: AC
  Administered 2018-11-26: 75 ug via INTRAVENOUS
  Filled 2018-11-26: qty 2

## 2018-11-26 MED ORDER — FENTANYL CITRATE (PF) 100 MCG/2ML IJ SOLN
50.0000 ug | INTRAMUSCULAR | Status: DC | PRN
  Administered 2018-11-26 – 2018-11-27 (×2): 100 ug via INTRAVENOUS

## 2018-11-26 MED ORDER — DOPAMINE-DEXTROSE 3.2-5 MG/ML-% IV SOLN
0.0000 ug/kg/min | INTRAVENOUS | Status: DC
  Administered 2018-11-26: 5 ug/kg/min via INTRAVENOUS
  Administered 2018-11-28: 3 ug/kg/min via INTRAVENOUS
  Filled 2018-11-26 (×2): qty 250

## 2018-11-26 MED ORDER — SODIUM CHLORIDE 0.9 % IV SOLN
INTRAVENOUS | Status: DC | PRN
  Administered 2018-11-26: 10:00:00 via INTRAVENOUS

## 2018-11-26 MED ORDER — SODIUM CHLORIDE 0.9 % IV BOLUS: 1000.0000 mL | Freq: Once | INTRAVENOUS | Status: DC

## 2018-11-26 MED ORDER — MIDAZOLAM HCL 2 MG/2ML IJ SOLN
INTRAMUSCULAR | Status: DC | PRN
  Administered 2018-11-26: 5 mg via INTRAVENOUS

## 2018-11-26 MED ORDER — KETAMINE HCL 10 MG/ML IJ SOLN
200.0000 mg | Freq: Once | INTRAMUSCULAR | Status: AC
  Administered 2018-11-26: 200 mg via INTRAVENOUS
  Filled 2018-11-26: qty 1

## 2018-11-26 MED ORDER — SODIUM BICARBONATE 8.4 % IV SOLN
150.0000 meq | Freq: Once | INTRAVENOUS | Status: AC
  Administered 2018-11-26: 150 meq via INTRAVENOUS
  Filled 2018-11-26: qty 150

## 2018-11-26 MED ORDER — VANCOMYCIN HCL IN DEXTROSE 1-5 GM/200ML-% IV SOLN
1000.0000 mg | INTRAVENOUS | Status: DC
  Filled 2018-11-26: qty 200

## 2018-11-26 MED ORDER — SODIUM CHLORIDE 0.9 % IV SOLN
2.0000 g | Freq: Two times a day (BID) | INTRAVENOUS | Status: DC
  Administered 2018-11-26 – 2018-11-27 (×2): 2 g via INTRAVENOUS
  Filled 2018-11-26 (×3): qty 2

## 2018-11-26 MED ORDER — ONDANSETRON HCL 4 MG PO TABS: 4.0000 mg | ORAL_TABLET | Freq: Four times a day (QID) | ORAL | Status: DC | PRN

## 2018-11-26 MED ORDER — IOHEXOL 300 MG/ML  SOLN
100.0000 mL | Freq: Once | INTRAMUSCULAR | Status: AC | PRN
  Administered 2018-11-26: 100 mL via INTRAVENOUS

## 2018-11-26 MED ORDER — ROCURONIUM BROMIDE 100 MG/10ML IV SOLN
INTRAVENOUS | Status: DC | PRN
  Administered 2018-11-26: 50 mg via INTRAVENOUS

## 2018-11-26 MED ORDER — FENTANYL 2500MCG IN NS 250ML (10MCG/ML) PREMIX INFUSION
50.0000 ug/h | INTRAVENOUS | Status: DC
  Administered 2018-11-26: 200 ug/h via INTRAVENOUS
  Administered 2018-11-26: 150 ug/h via INTRAVENOUS
  Administered 2018-11-26: 200 ug/h via INTRAVENOUS
  Administered 2018-11-26: 150 ug/h via INTRAVENOUS
  Administered 2018-11-27: 225 ug/h via INTRAVENOUS
  Filled 2018-11-26 (×4): qty 250

## 2018-11-26 MED ORDER — ATROPINE SULFATE 1 MG/10ML IJ SOSY
PREFILLED_SYRINGE | INTRAMUSCULAR | Status: AC
  Filled 2018-11-26: qty 10

## 2018-11-26 MED ORDER — VANCOMYCIN HCL 10 G IV SOLR
2000.0000 mg | Freq: Once | INTRAVENOUS | Status: AC
  Administered 2018-11-26: 2000 mg via INTRAVENOUS
  Filled 2018-11-26: qty 2000

## 2018-11-26 MED ORDER — MAGNESIUM SULFATE IN D5W 1-5 GM/100ML-% IV SOLN
1.0000 g | Freq: Once | INTRAVENOUS | Status: AC
  Administered 2018-11-26: 1 g via INTRAVENOUS
  Filled 2018-11-26: qty 100

## 2018-11-26 MED ORDER — INSULIN ASPART 100 UNIT/ML ~~LOC~~ SOLN
0.0000 [IU] | SUBCUTANEOUS | Status: DC
  Administered 2018-11-26: 11 [IU] via SUBCUTANEOUS
  Administered 2018-11-27 (×3): 5 [IU] via SUBCUTANEOUS
  Administered 2018-11-27: 8 [IU] via SUBCUTANEOUS
  Administered 2018-11-27: 5 [IU] via SUBCUTANEOUS
  Administered 2018-11-27 – 2018-11-28 (×2): 8 [IU] via SUBCUTANEOUS
  Administered 2018-11-28 (×2): 3 [IU] via SUBCUTANEOUS
  Administered 2018-11-28: 5 [IU] via SUBCUTANEOUS
  Administered 2018-11-28 (×2): 3 [IU] via SUBCUTANEOUS
  Administered 2018-11-29 (×2): 5 [IU] via SUBCUTANEOUS
  Filled 2018-11-26 (×15): qty 1

## 2018-11-26 MED ORDER — LACTATED RINGERS IV SOLN
INTRAVENOUS | Status: DC
  Administered 2018-11-26: 100 mL/h via INTRAVENOUS
  Administered 2018-11-26 – 2018-11-27 (×2): via INTRAVENOUS

## 2018-11-26 MED ORDER — LACTATED RINGERS IV BOLUS
1000.0000 mL | Freq: Once | INTRAVENOUS | Status: AC
  Administered 2018-11-26: 1000 mL via INTRAVENOUS

## 2018-11-26 MED ORDER — PROPOFOL 10 MG/ML IV BOLUS: 50.0000 mg | Freq: Once | INTRAVENOUS | Status: DC

## 2018-11-26 MED ORDER — FENTANYL CITRATE (PF) 100 MCG/2ML IJ SOLN: 200.0000 ug | Freq: Once | INTRAMUSCULAR | Status: DC

## 2018-11-26 MED ORDER — CIPROFLOXACIN IN D5W 400 MG/200ML IV SOLN
400.0000 mg | Freq: Two times a day (BID) | INTRAVENOUS | Status: DC
  Administered 2018-11-26 – 2018-11-27 (×3): 400 mg via INTRAVENOUS
  Filled 2018-11-26 (×4): qty 200

## 2018-11-26 MED ORDER — METRONIDAZOLE IN NACL 5-0.79 MG/ML-% IV SOLN
500.0000 mg | Freq: Three times a day (TID) | INTRAVENOUS | Status: DC
  Administered 2018-11-26: 500 mg via INTRAVENOUS
  Filled 2018-11-26: qty 100

## 2018-11-26 MED ORDER — PROPOFOL 1000 MG/100ML IV EMUL
0.0000 ug/kg/min | INTRAVENOUS | Status: DC
  Administered 2018-11-26: 40 ug/kg/min via INTRAVENOUS
  Administered 2018-11-26: 30 ug/kg/min via INTRAVENOUS
  Administered 2018-11-26 (×2): 20 ug/kg/min via INTRAVENOUS
  Administered 2018-11-26: 5 ug/kg/min via INTRAVENOUS
  Administered 2018-11-26: 50 ug/kg/min via INTRAVENOUS
  Administered 2018-11-27: 10 ug/kg/min via INTRAVENOUS
  Administered 2018-11-28: 15 ug/kg/min via INTRAVENOUS
  Filled 2018-11-26 (×6): qty 100

## 2018-11-26 MED ORDER — HYDROCORTISONE NA SUCCINATE PF 100 MG IJ SOLR
200.0000 mg | Freq: Once | INTRAMUSCULAR | Status: AC
  Administered 2018-11-26: 200 mg via INTRAVENOUS
  Filled 2018-11-26: qty 4

## 2018-11-26 MED ORDER — NOREPINEPHRINE-SODIUM CHLORIDE 4-0.9 MG/250ML-% IV SOLN
0.0000 ug/min | INTRAVENOUS | Status: DC
  Administered 2018-11-26: 17 ug/min via INTRAVENOUS
  Filled 2018-11-26: qty 250

## 2018-11-26 MED ORDER — ROCURONIUM BROMIDE 50 MG/5ML IV SOLN
80.0000 mg | Freq: Once | INTRAVENOUS | Status: AC
  Administered 2018-11-26: 80 mg via INTRAVENOUS
  Filled 2018-11-26: qty 8

## 2018-11-26 MED ORDER — ONDANSETRON HCL 4 MG/2ML IJ SOLN: 4.0000 mg | Freq: Four times a day (QID) | INTRAMUSCULAR | Status: DC | PRN

## 2018-11-26 MED ORDER — PROPOFOL 10 MG/ML IV BOLUS
INTRAVENOUS | Status: AC
  Filled 2018-11-26: qty 20

## 2018-11-26 MED ORDER — ACETAMINOPHEN 325 MG PO TABS: 650.0000 mg | ORAL_TABLET | Freq: Four times a day (QID) | ORAL | Status: DC | PRN

## 2018-11-26 MED ORDER — SODIUM CHLORIDE 0.9% FLUSH
3.0000 mL | Freq: Two times a day (BID) | INTRAVENOUS | Status: DC
  Administered 2018-11-26 – 2018-11-30 (×9): 3 mL via INTRAVENOUS

## 2018-11-26 MED ORDER — MIDAZOLAM HCL 5 MG/5ML IJ SOLN
INTRAMUSCULAR | Status: AC
  Filled 2018-11-26: qty 5

## 2018-11-26 MED ORDER — FENTANYL CITRATE (PF) 100 MCG/2ML IJ SOLN
250.0000 ug | Freq: Once | INTRAMUSCULAR | Status: AC
  Administered 2018-11-26: 250 ug via INTRAVENOUS
  Filled 2018-11-26: qty 6

## 2018-11-26 MED ORDER — SODIUM CHLORIDE 0.9 % IV SOLN: 250.0000 mL | INTRAVENOUS | Status: DC | PRN

## 2018-11-26 MED ORDER — SODIUM CHLORIDE 0.9% FLUSH: 3.0000 mL | INTRAVENOUS | Status: DC | PRN

## 2018-11-26 MED ORDER — NOREPINEPHRINE BITARTRATE 1 MG/ML IV SOLN
INTRAVENOUS | Status: AC
  Filled 2018-11-26: qty 4

## 2018-11-26 MED ORDER — ORAL CARE MOUTH RINSE
15.0000 mL | OROMUCOSAL | Status: DC
  Administered 2018-11-26 – 2018-11-28 (×18): 15 mL via OROMUCOSAL

## 2018-11-26 MED ORDER — SODIUM BICARBONATE 8.4 % IV SOLN
100.0000 meq | Freq: Once | INTRAVENOUS | Status: AC
  Administered 2018-11-26: 100 meq via INTRAVENOUS
  Filled 2018-11-26: qty 100

## 2018-11-26 MED ORDER — MILRINONE LACTATE IN DEXTROSE 20-5 MG/100ML-% IV SOLN
0.3750 ug/kg/min | INTRAVENOUS | Status: DC
  Administered 2018-11-26 (×2): 0.375 ug/kg/min via INTRAVENOUS
  Filled 2018-11-26 (×3): qty 100

## 2018-11-26 MED ORDER — HYDROCORTISONE NA SUCCINATE PF 100 MG IJ SOLR
100.0000 mg | Freq: Three times a day (TID) | INTRAMUSCULAR | Status: DC
  Administered 2018-11-26 – 2018-11-27 (×3): 100 mg via INTRAVENOUS
  Filled 2018-11-26 (×3): qty 2

## 2018-11-26 MED ORDER — CHLORHEXIDINE GLUCONATE 0.12% ORAL RINSE (MEDLINE KIT)
15.0000 mL | Freq: Two times a day (BID) | OROMUCOSAL | Status: DC
  Administered 2018-11-26 – 2018-11-28 (×4): 15 mL via OROMUCOSAL

## 2018-11-26 MED ORDER — MILRINONE LACTATE IN DEXTROSE 20-5 MG/100ML-% IV SOLN: 0.3750 ug/kg/min | INTRAVENOUS | Status: DC

## 2018-11-26 MED ORDER — PHENYLEPHRINE HCL 10 MG/ML IJ SOLN
INTRAMUSCULAR | Status: DC | PRN
  Administered 2018-11-26 (×2): 100 ug via INTRAVENOUS

## 2018-11-26 MED ORDER — ACETAMINOPHEN 650 MG RE SUPP: 650.0000 mg | Freq: Four times a day (QID) | RECTAL | Status: DC | PRN

## 2018-11-26 MED ORDER — VANCOMYCIN HCL IN DEXTROSE 1-5 GM/200ML-% IV SOLN: 1000.0000 mg | Freq: Once | INTRAVENOUS | Status: DC

## 2018-11-26 MED ORDER — NOREPINEPHRINE 16 MG/250ML-% IV SOLN
0.0000 ug/min | INTRAVENOUS | Status: DC
  Administered 2018-11-26: 15 ug/min via INTRAVENOUS
  Filled 2018-11-26: qty 250

## 2018-11-26 MED ORDER — NOREPINEPHRINE-SODIUM CHLORIDE 4-0.9 MG/250ML-% IV SOLN
0.0000 ug/min | INTRAVENOUS | Status: DC
  Administered 2018-11-26: 15 ug/min via INTRAVENOUS
  Administered 2018-11-26: 2 ug/min via INTRAVENOUS
  Filled 2018-11-26 (×3): qty 250

## 2018-11-26 MED ORDER — DEXMEDETOMIDINE HCL IN NACL 400 MCG/100ML IV SOLN
0.4000 ug/kg/h | INTRAVENOUS | Status: DC
  Administered 2018-11-26: 1 ug/kg/h via INTRAVENOUS
  Filled 2018-11-26 (×2): qty 100

## 2018-11-26 SURGICAL SUPPLY — 22 items
ADAPTER IRRIG TUBE 2 SPIKE SOL (ADAPTER) ×6 IMPLANT
BAG DRAIN CYSTO-URO LG1000N (MISCELLANEOUS) ×3 IMPLANT
BAG URO DRAIN 4000ML (MISCELLANEOUS) ×3 IMPLANT
CATH FOL 2WAY LX 22X30 (CATHETERS) ×3 IMPLANT
CATH FOL 2WAY LX 24X30 (CATHETERS) IMPLANT
DRAPE UTILITY 15X26 TOWEL STRL (DRAPES) ×3 IMPLANT
ELECT LOOP 22F BIPOLAR SML (ELECTROSURGICAL)
ELECTRODE LOOP 22F BIPOLAR SML (ELECTROSURGICAL) IMPLANT
GLOVE BIO SURGEON STRL SZ 6.5 (GLOVE) ×2 IMPLANT
GLOVE BIO SURGEONS STRL SZ 6.5 (GLOVE) ×1
GOWN STRL REUS W/ TWL LRG LVL3 (GOWN DISPOSABLE) ×2 IMPLANT
GOWN STRL REUS W/TWL LRG LVL3 (GOWN DISPOSABLE) ×4
HOLDER FOLEY CATH W/STRAP (MISCELLANEOUS) ×3 IMPLANT
KIT TURNOVER CYSTO (KITS) ×3 IMPLANT
LOOP CUT BIPOLAR 24F LRG (ELECTROSURGICAL) ×3 IMPLANT
PACK CYSTO AR (MISCELLANEOUS) ×3 IMPLANT
SET IRRIG Y TYPE TUR BLADDER L (SET/KITS/TRAYS/PACK) ×3 IMPLANT
SOL .9 NS 3000ML IRR  AL (IV SOLUTION) ×12
SOL .9 NS 3000ML IRR UROMATIC (IV SOLUTION) ×6 IMPLANT
SURGILUBE 2OZ TUBE FLIPTOP (MISCELLANEOUS) ×3 IMPLANT
SYRINGE IRR TOOMEY STRL 70CC (SYRINGE) ×3 IMPLANT
WATER STERILE IRR 1000ML POUR (IV SOLUTION) ×3 IMPLANT

## 2018-11-26 NOTE — Anesthesia Preprocedure Evaluation (Signed)
Anesthesia Evaluation  Patient identified by MRN, date of birth, ID band Patient awake    Reviewed: Allergy & Precautions, H&P , NPO status , Patient's Chart, lab work & pertinent test results, reviewed documented beta blocker date and time   Airway Mallampati: II  TM Distance: >3 FB Neck ROM: full    Dental  (+) Teeth Intact   Pulmonary neg pulmonary ROS, sleep apnea , former smoker,    Pulmonary exam normal        Cardiovascular Exercise Tolerance: Poor hypertension, + CAD  negative cardio ROS Normal cardiovascular exam+ Valvular Problems/Murmurs  Rhythm:regular Rate:Normal     Neuro/Psych  Neuromuscular disease negative neurological ROS  negative psych ROS   GI/Hepatic GERD  Medicated,(+) Cirrhosis   Esophageal Varices    ,   Endo/Other  negative endocrine ROSdiabetes  Renal/GU Renal diseasenegative Renal ROS  negative genitourinary   Musculoskeletal   Abdominal   Peds  Hematology  (+) Blood dyscrasia, anemia ,   Anesthesia Other Findings Past Medical History: No date: Allergy No date: Arthritis     Comment:  "everywhere' - worse in back No date: Body mass index 39.0-39.9, adult No date: CAD (coronary artery disease) No date: Chronic back pain No date: Cirrhosis, non-alcoholic ([facility]) No date: Diabetes mellitus without complication ([facility]) No date: Elevated liver enzymes No date: GERD (gastroesophageal reflux disease) No date: Heart murmur No date: Hyperlipidemia No date: Hypertension No date: Hypogonadism in male No date: Melanoma [facility])     Comment:  melanoma  No date: Melanoma in situ of right shoulder ([place]) 01/2018: Myasthenia gravis ([place]) No date: Renal stones No date: Sleep apnea     Comment:  CPAP No date: Uses hearing aid     Comment:  doesn't wear Past Surgical History: 1999: ANGIOPLASTY     Comment:  3 stents No date: CARDIAC CATHETERIZATION     Comment:  stents placed No date: CARPAL  TUNNEL RELEASE     Comment:  x2 No date: CATARACT EXTRACTION, BILATERAL 07/03/2018: COLONOSCOPY WITH PROPOFOL; N/A     Comment:  Procedure: COLONOSCOPY WITH PROPOFOL;  Surgeon: Lucilla Lame, MD;  Location: [facility];  Service:               Endoscopy;  Laterality: N/A; 09/08/2015: ESOPHAGOGASTRODUODENOSCOPY (EGD) WITH PROPOFOL; N/A     Comment:  Procedure: ESOPHAGOGASTRODUODENOSCOPY (EGD) WITH               PROPOFOL;  Surgeon: Lucilla Lame, MD;  Location: [facility];  Service: Endoscopy;  Laterality: N/A;                CPAP Diabetic - oral meds 07/03/2018: ESOPHAGOGASTRODUODENOSCOPY (EGD) WITH PROPOFOL; N/A     Comment:  Procedure: ESOPHAGOGASTRODUODENOSCOPY (EGD) WITH               PROPOFOL;  Surgeon: Lucilla Lame, MD;  Location: [facility];  Service: Endoscopy;  Laterality: N/A;                diabetic - oral meds sleep apnea No date: LIVER BIOPSY 07/03/2018: POLYPECTOMY; N/A     Comment:  Procedure: POLYPECTOMY;  Surgeon: Lucilla Lame, MD;  Location: [facility];  Service: Endoscopy;                Laterality: N/A; 1998: SKIN LESION EXCISION No date: SKIN LESION EXCISION     Comment:  skin cancer removed on forehead in June 2018 No date: TONSILLECTOMY     Comment:  age 35 BMI    Body Mass Index:  33.00 kg/m     Reproductive/Obstetrics negative OB ROS                             Anesthesia Physical Anesthesia Plan  ASA: IV and emergent  Anesthesia Plan: General ETT   Post-op Pain Management:    Induction:   PONV Risk Score and Plan:   Airway Management Planned:   Additional Equipment:   Intra-op Plan:   Post-operative Plan:   Informed Consent: I have reviewed the patients History and Physical, chart, labs and discussed the procedure including the risks, benefits and alternatives for the proposed anesthesia with the patient or authorized  representative who has indicated his/her understanding and acceptance.     Dental Advisory Given  Plan Discussed with: CRNA  Anesthesia Plan Comments:         Anesthesia Quick Evaluation

## 2018-11-26 NOTE — Anesthesia Post-op Follow-up Note (Signed)
Anesthesia QCDR form completed.        

## 2018-11-26 NOTE — Consult Note (Signed)
Subjective: CC: Prostatic abscess with sepsis.   Hx: Mason Wilson is a 78 yo WM who I was asked to see in consultation by Dr. Saundra Shelling for a prostatic abscess with sepsis.   He had the onset 3 days ago of fever, chills and bloody diarrhea.  He presented to the ER where he was found to be septic in respiratory failure and has subsequently been intubated.   A CT showed a complex fluid collection in the prostate consistent with a multilocular prostatic abscess.  He has a history of recurrent UTI's and has been followed by Natasha Mead PA with [facility] for several years with the most recent visit on 10/16/18.   He was also sent [facility] for a UTI on 11/10/18.   He has had prior stones remotely but no GU surgery.   His culture on 11/10/17 grew e. Coli sensitive to all but Augmentin and Ampicillin.  A culture on 10/21/17 had a similar pattern of resistance.     ROS:  Review of Systems  Unable to perform ROS: Intubated  Constitutional: Positive for chills and fever.  Gastrointestinal: Positive for diarrhea, nausea and vomiting.    Allergies  Allergen Reactions  . Altace [Ramipril] Cough    Past Medical History:  Diagnosis Date  . Allergy   . Arthritis    "everywhere' - worse in back  . Body mass index 39.0-39.9, adult   . CAD (coronary artery disease)   . Chronic back pain   . Cirrhosis, non-alcoholic ([place])   . Diabetes mellitus without complication ([place])   . Elevated liver enzymes   . GERD (gastroesophageal reflux disease)   . Heart murmur   . Hyperlipidemia   . Hypertension   . Hypogonadism in male   . Melanoma ([place])    melanoma   . Melanoma in situ of right shoulder ([place])   . Myasthenia gravis ([place]) 01/2018  . Renal stones   . Sleep apnea    CPAP  . Uses hearing aid    doesn't wear    Past Surgical History:  Procedure Laterality Date  . ANGIOPLASTY  1999   3 stents  . CARDIAC CATHETERIZATION     stents placed  . CARPAL TUNNEL RELEASE     x2  . CATARACT  EXTRACTION, BILATERAL    . COLONOSCOPY WITH PROPOFOL N/A 07/03/2018   Procedure: COLONOSCOPY WITH PROPOFOL;  Surgeon: Lucilla Lame, MD;  Location: [place];  Service: Endoscopy;  Laterality: N/A;  . ESOPHAGOGASTRODUODENOSCOPY (EGD) WITH PROPOFOL N/A 09/08/2015   Procedure: ESOPHAGOGASTRODUODENOSCOPY (EGD) WITH PROPOFOL;  Surgeon: Lucilla Lame, MD;  Location: [place];  Service: Endoscopy;  Laterality: N/A;  CPAP Diabetic - oral meds  . ESOPHAGOGASTRODUODENOSCOPY (EGD) WITH PROPOFOL N/A 07/03/2018   Procedure: ESOPHAGOGASTRODUODENOSCOPY (EGD) WITH PROPOFOL;  Surgeon: Lucilla Lame, MD;  Location: [place];  Service: Endoscopy;  Laterality: N/A;  diabetic - oral meds sleep apnea  . LIVER BIOPSY    . POLYPECTOMY N/A 07/03/2018   Procedure: POLYPECTOMY;  Surgeon: Lucilla Lame, MD;  Location: [place];  Service: Endoscopy;  Laterality: N/A;  . SKIN LESION EXCISION  1998  . SKIN LESION EXCISION     skin cancer removed on forehead in June 2018  . TONSILLECTOMY     age 58    Social History   Socioeconomic History  . Marital status: Married    Spouse name: Not on file  . Number of children: Not on file  . Years of education: Not  on file  . Highest education level: Not on file  Occupational History  . Not on file  Social Needs  . Financial resource strain: Not hard at all  . Food insecurity:    Worry: Never true    Inability: Never true  . Transportation needs:    Medical: No    Non-medical: No  Tobacco Use  . Smoking status: Former Smoker    Types: Cigarettes    Last attempt to quit: 04/02/1970    Years since quitting: 48.6  . Smokeless tobacco: Current User    Types: Chew  . Tobacco comment: approx quit in 1971   Substance and Sexual Activity  . Alcohol use: No    Alcohol/week: 0.0 standard drinks    Comment: stopped in 1996  . Drug use: No  . Sexual activity: Yes    Partners: Female  Lifestyle  . Physical activity:    Days per week:  0 days    Minutes per session: 0 min  . Stress: Not at all  Relationships  . Social connections:    Talks on phone: More than three times a week    Gets together: Once a week    Attends religious service: Never    Active member of club or organization: No    Attends meetings of clubs or organizations: Never    Relationship status: Married  . Intimate partner violence:    Fear of current or ex partner: No    Emotionally abused: No    Physically abused: No    Forced sexual activity: No  Other Topics Concern  . Not on file  Social History Narrative  . Not on file    Family History  Problem Relation Age of Onset  . Thyroid disease Mother   . Alzheimer's disease Mother   . Heart disease Mother   . Cancer Father 2       testicular  . Heart disease Father   . Hypertension Father   . Heart attack Father   . Kidney disease Neg Hx   . Prostate cancer Neg Hx   . Kidney cancer Neg Hx   . Bladder Cancer Neg Hx     Anti-infectives: Anti-infectives (From admission, onward)   Start     Dose/Rate Route Frequency Ordered Stop   11/27/18 0000  vancomycin (VANCOCIN) IVPB 1000 mg/200 mL premix     1,000 mg 200 mL/hr over 60 Minutes Intravenous Every 24 hours 11/26/18 0942     11/26/18 1800  ceFEPIme (MAXIPIME) 2 g in sodium chloride 0.9 % 100 mL IVPB     2 g 200 mL/hr over 30 Minutes Intravenous Every 12 hours 11/26/18 0942     11/26/18 0345  ceFEPIme (MAXIPIME) 2 g in sodium chloride 0.9 % 100 mL IVPB     2 g 200 mL/hr over 30 Minutes Intravenous  Once 11/26/18 0339 11/26/18 0451   11/26/18 0345  metroNIDAZOLE (FLAGYL) IVPB 500 mg     500 mg 100 mL/hr over 60 Minutes Intravenous Every 8 hours 11/26/18 0339     11/26/18 0345  vancomycin (VANCOCIN) IVPB 1000 mg/200 mL premix  Status:  Discontinued     1,000 mg 200 mL/hr over 60 Minutes Intravenous  Once 11/26/18 0339 11/26/18 0341   11/26/18 0345  vancomycin (VANCOCIN) 2,000 mg in sodium chloride 0.9 % 500 mL IVPB     2,000  mg 250 mL/hr over 120 Minutes Intravenous  Once 11/26/18 0344 11/26/18 0617  Current Facility-Administered Medications  Medication Dose Route Frequency Provider Last Rate Last Dose  . ceFEPIme (MAXIPIME) 2 g in sodium chloride 0.9 % 100 mL IVPB  2 g Intravenous Q12H Dallie Piles, RPH      . dexmedetomidine (PRECEDEX) 400 MCG/100ML (4 mcg/mL) infusion  0.4-1.2 mcg/kg/hr Intravenous Titrated Darel Hong, MD 25 mL/hr at 11/26/18 0646 1 mcg/kg/hr at 11/26/18 0646  . fentaNYL (SUBLIMAZE) injection 200 mcg  200 mcg Intravenous Once Darel Hong, MD      . fentaNYL 2547mcg in NS 216mL (53mcg/ml) infusion-PREMIX  150 mcg/hr Intravenous Continuous Darel Hong, MD 15 mL/hr at 11/26/18 0853 150 mcg/hr at 11/26/18 0853  . metroNIDAZOLE (FLAGYL) IVPB 500 mg  500 mg Intravenous Q8H Darel Hong, MD   Stopped at 11/26/18 0554  . milrinone (PRIMACOR) 20 MG/100 ML (0.2 mg/mL) infusion  0.375 mcg/kg/min Intravenous Continuous Darel Hong, MD 11.73 mL/hr at 11/26/18 0745 0.375 mcg/kg/min at 11/26/18 0745  . norepinephrine (LEVOPHED) 4mg  in NS 234mL premix infusion  0-40 mcg/min Intravenous Continuous Darel Hong, MD 18.75 mL/hr at 11/26/18 0932 5 mcg/min at 11/26/18 0932  . propofol (DIPRIVAN) 10 mg/mL bolus/IV push 50 mg  50 mg Intravenous Once Darel Hong, MD      . propofol (DIPRIVAN) 1000 MG/100ML infusion  5-80 mcg/kg/min Intravenous Continuous Darel Hong, MD 9.39 mL/hr at 11/26/18 0802 15 mcg/kg/min at 11/26/18 0802  . [START ON 11/27/2018] vancomycin (VANCOCIN) IVPB 1000 mg/200 mL premix  1,000 mg Intravenous Q24H Dallie Piles, RPH      . vasopressin (PITRESSIN) 40 Units in sodium chloride 0.9 % 250 mL (0.16 Units/mL) infusion  0.03 Units/min Intravenous Continuous Darel Hong, MD 11.25 mL/hr at 11/26/18 0743 0.03 Units/min at 11/26/18 6606   Current Outpatient Medications  Medication Sig Dispense Refill  . Calcium Carbonate-Vit D-Min (CALCIUM 1200 PO) Take 1,200  mg by mouth.    Marland Kitchen glipiZIDE (GLUCOTROL) 5 MG tablet Take 1 tablet (5 mg total) by mouth daily before breakfast. 90 tablet 4  . hydrochlorothiazide (HYDRODIURIL) 25 MG tablet Take 1 tablet (25 mg total) by mouth daily. 90 tablet 4  . losartan (COZAAR) 100 MG tablet Take 1 tablet (100 mg total) by mouth daily. 90 tablet 4  . metFORMIN (GLUCOPHAGE) 500 MG tablet Take 2 tablets (1,000 mg total) by mouth 2 (two) times daily with a meal. 360 tablet 4  . metoprolol succinate (TOPROL-XL) 50 MG 24 hr tablet Take 1 tablet (50 mg total) by mouth daily. Take with or immediately following a meal. 90 tablet 4  . omeprazole (PRILOSEC) 40 MG capsule Take 40 mg by mouth daily.     Marland Kitchen pyridostigmine (MESTINON) 60 MG tablet Take 60 mg by mouth 4 (four) times daily.    . simvastatin (ZOCOR) 40 MG tablet Take 1 tablet (40 mg total) by mouth daily at 6 PM. 90 tablet 4  . tamsulosin (FLOMAX) 0.4 MG CAPS capsule TAKE 1 CAPSULE(0.4 MG) BY MOUTH DAILY 90 capsule 3  . cephALEXin (KEFLEX) 500 MG capsule Take 1 capsule (500 mg total) by mouth 2 (two) times daily. (Patient not taking: Reported on 11/26/2018) 14 capsule 0  . predniSONE (DELTASONE) 20 MG tablet   3  . sulfamethoxazole-trimethoprim (BACTRIM DS,SEPTRA DS) 800-160 MG tablet Take 1 tablet by mouth every 12 (twelve) hours. (Patient not taking: Reported on 11/26/2018) 14 tablet 0     Objective: Vital signs in last 24 hours: Temp:  [97.6 F (36.4 C)-102.6 F (39.2 C)] 100.4 F (38 C) (01/19 0930)  Pulse Rate:  [80-109] 87 (01/19 0930) Resp:  [20-43] 29 (01/19 0930) BP: (72-129)/(46-103) 106/61 (01/19 0930) SpO2:  [90 %-100 %] 97 % (01/19 0930) FiO2 (%):  [40 %] 40 % (01/19 0600) Weight:  [104.3 kg] 104.3 kg (01/19 0333)  Intake/Output from previous day: 01/18 0701 - 01/19 0700 In: 5405.6 [I.V.:82.9; IV Piggyback:5322.7] Out: -  Intake/Output this shift: No intake/output data recorded.   Physical Exam Vitals signs reviewed.  Constitutional:       Appearance: He is obese. He is toxic-appearing.     Interventions: He is sedated and intubated.     Comments: WD, WN with cushinoid appearance.  HENT:     Head: Atraumatic.  Neck:     Musculoskeletal: Neck supple.     Thyroid: No thyroid mass or thyromegaly.  Cardiovascular:     Rate and Rhythm: Normal rate and regular rhythm.     Heart sounds: Normal heart sounds.  Pulmonary:     Effort: He is intubated.     Comments: Diminish BS  Abdominal:     Palpations: Abdomen is soft.     Tenderness: There is no abdominal tenderness.     Hernia: There is no hernia in the right inguinal area or left inguinal area.     Comments: Obese   Genitourinary:    Penis: Uncircumcised.      Scrotum/Testes: Normal.     Epididymis:     Right: Normal.     Left: Normal.     Comments: Mild penoscrotal edema with foley in place.  Musculoskeletal:     Right lower leg: Edema present.     Left lower leg: Edema present.  Lymphadenopathy:     Cervical: No cervical adenopathy.     Lower Body: No right inguinal adenopathy. No left inguinal adenopathy.  Skin:    General: Skin is warm and moist.     Findings: No bruising.  Neurological:     Comments: Intubated but moves all four extremities.      Lab Results:  Recent Labs    11/26/18 0343  WBC 0.8*  HGB 9.7*  HCT 29.8*  PLT 64*   BMET Recent Labs    11/26/18 0343  NA 141  K 3.3*  CL 111  CO2 18*  GLUCOSE 126*  BUN 63*  CREATININE 1.60*  CALCIUM 8.9   PT/INR Recent Labs    11/26/18 0352  LABPROT 15.8*  INR 1.27   ABG Recent Labs    11/26/18 0645  PHART 7.30*  HCO3 19.2*    Studies/Results: Ct Chest W Contrast  Result Date: 11/26/2018 CLINICAL DATA:  Sepsis. Dyspnea. Nausea. Diarrhea. Abdominal distention. EXAM: CT CHEST, ABDOMEN, AND PELVIS WITH CONTRAST TECHNIQUE: Multidetector CT imaging of the chest, abdomen and pelvis was performed following the standard protocol during bolus administration of intravenous contrast.  CONTRAST:  177mL OMNIPAQUE IOHEXOL 300 MG/ML  SOLN COMPARISON:  35019 chest CT.  08/01/2015 CT abdomen/pelvis. FINDINGS: CT CHEST FINDINGS Cardiovascular: Top-normal heart size. No significant pericardial effusion/thickening. 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 Atherosclerotic nonaneurysmal thoracic aorta. Top-normal caliber main pulmonary artery (3.4 cm diameter). No central pulmonary emboli. Mediastinum/Nodes: No discrete thyroid nodules. Unremarkable esophagus. No pathologically enlarged axillary, mediastinal or hilar lymph nodes. Lungs/Pleura: No pneumothorax. No pleural effusion. There is relatively symmetric patchy parahilar consolidation and ground-glass opacity in both lungs, upper lobe predominant. No lung masses. Musculoskeletal: No aggressive appearing focal osseous lesions. Moderate thoracic spondylosis. CT ABDOMEN PELVIS FINDINGS Hepatobiliary: Diffusely irregular liver surface compatible  with hepatic cirrhosis. No liver masses. Normal gallbladder with no radiopaque cholelithiasis. No biliary ductal dilatation. Pancreas: There is diffuse haziness of the peripancreatic fat extending into anterior paranephric retroperitoneal spaces bilaterally, suggestive of acute pancreatitis. No peripancreatic fluid collections. Preserved pancreatic parenchymal enhancement. No pancreatic mass or duct dilation. Spleen: Mild to moderate splenomegaly with craniocaudal splenic length 16.2 cm. No splenic mass. Adrenals/Urinary Tract: Normal adrenals. No hydronephrosis. Simple 2.3 cm lateral lower right renal cyst. Normal bladder. Stomach/Bowel: Normal non-distended stomach. Normal caliber small bowel with no small bowel wall thickening. Normal appendix. Normal large bowel with no diverticulosis, large bowel wall thickening or pericolonic fat stranding. Vascular/Lymphatic: Atherosclerotic nonaneurysmal abdominal aorta. Patent portal, splenic, hepatic and renal veins. No pathologically enlarged lymph nodes in the  abdomen or pelvis. Reproductive: Moderate-to-marked prostatomegaly with bilateral prostatic fluid collections measuring 3.3 x 3.0 cm on the right and 4.0 x 2.9 cm on the left, new since 08/01/2015 CT. Haziness of the periprostatic fat. Other: No pneumoperitoneum, ascites or focal fluid collection. Musculoskeletal: No aggressive appearing focal osseous lesions. Moderate lumbar spondylosis. IMPRESSION: 1. Relatively symmetric patchy parahilar consolidation and ground-glass opacity in both lungs, upper lobe predominant. Favor multilobar bronchopneumonia, although flash pulmonary edema could also have this appearance. 2. Acute uncomplicated pancreatitis. 3. 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 4. Hepatic cirrhosis.  Splenomegaly.  No ascites. 5. 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 6.  Aortic Atherosclerosis (ICD10-I70.0). Electronically Signed   By: Ilona Sorrel M.D.   On: 11/26/2018 05:52   Ct Abdomen Pelvis W Contrast  Result Date: 11/26/2018 CLINICAL DATA:  Sepsis. Dyspnea. Nausea. Diarrhea. Abdominal distention. EXAM: CT CHEST, ABDOMEN, AND PELVIS WITH CONTRAST TECHNIQUE: Multidetector CT imaging of the chest, abdomen and pelvis was performed following the standard protocol during bolus administration of intravenous contrast. CONTRAST:  13mL OMNIPAQUE IOHEXOL 300 MG/ML  SOLN COMPARISON:  35019 chest CT.  08/01/2015 CT abdomen/pelvis. FINDINGS: CT CHEST FINDINGS Cardiovascular: Top-normal heart size. No significant pericardial effusion/thickening. 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 Atherosclerotic nonaneurysmal thoracic aorta. Top-normal caliber main pulmonary artery (3.4 cm diameter). No central pulmonary emboli. Mediastinum/Nodes: No discrete thyroid nodules. Unremarkable esophagus. No pathologically enlarged  axillary, mediastinal or hilar lymph nodes. Lungs/Pleura: No pneumothorax. No pleural effusion. There is relatively symmetric patchy parahilar consolidation and ground-glass opacity in both lungs, upper lobe predominant. No lung masses. Musculoskeletal: No aggressive appearing focal osseous lesions. Moderate thoracic spondylosis. CT ABDOMEN PELVIS FINDINGS Hepatobiliary: Diffusely irregular liver surface compatible with hepatic cirrhosis. No liver masses. Normal gallbladder with no radiopaque cholelithiasis. No biliary ductal dilatation. Pancreas: There is diffuse haziness of the peripancreatic fat extending into anterior paranephric retroperitoneal spaces bilaterally, suggestive of acute pancreatitis. No peripancreatic fluid collections. Preserved pancreatic parenchymal enhancement. No pancreatic mass or duct dilation. Spleen: Mild to moderate splenomegaly with craniocaudal splenic length 16.2 cm. No splenic mass. Adrenals/Urinary Tract: Normal adrenals. No hydronephrosis. Simple 2.3 cm lateral lower right renal cyst. Normal bladder. Stomach/Bowel: Normal non-distended stomach. Normal caliber small bowel with no small bowel wall thickening. Normal appendix. Normal large bowel with no diverticulosis, large bowel wall thickening or pericolonic fat stranding. Vascular/Lymphatic: Atherosclerotic nonaneurysmal abdominal aorta. Patent portal, splenic, hepatic and renal veins. No pathologically enlarged lymph nodes in the abdomen or pelvis. Reproductive: Moderate-to-marked prostatomegaly with bilateral prostatic fluid collections measuring 3.3 x 3.0 cm on the right and 4.0 x 2.9 cm on the  left, new since 08/01/2015 CT. Haziness of the periprostatic fat. Other: No pneumoperitoneum, ascites or focal fluid collection. Musculoskeletal: No aggressive appearing focal osseous lesions. Moderate lumbar spondylosis. IMPRESSION: 1. Relatively symmetric patchy parahilar consolidation and ground-glass opacity in both lungs, upper lobe  predominant. Favor multilobar bronchopneumonia, although flash pulmonary edema could also have this appearance. 2. Acute uncomplicated pancreatitis. 3. 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 4. Hepatic cirrhosis.  Splenomegaly.  No ascites. 5. 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 6.  Aortic Atherosclerosis (ICD10-I70.0). Electronically Signed   By: Ilona Sorrel M.D.   On: 11/26/2018 05:52   Dg Chest Portable 1 View  Result Date: 11/26/2018 CLINICAL DATA:  Intubated EXAM: PORTABLE CHEST 1 VIEW COMPARISON:  Chest radiograph from earlier today. FINDINGS: Endotracheal tube tip is 4.2 cm above the carina. Enteric tube enters stomach with the tip not seen on this image. Stable cardiomediastinal silhouette with borderline mild cardiomegaly. No pneumothorax. No pleural effusion. Rapidly worsening fluffy parahilar opacities in both lungs. IMPRESSION: 1. Well-positioned endotracheal and enteric tubes. 2. Rapidly worsening fluffy parahilar opacities in both lungs and borderline mild cardiomegaly, compatible with congestive heart failure with flash pulmonary edema. Electronically Signed   By: Ilona Sorrel M.D.   On: 11/26/2018 06:33   Dg Chest Port 1 View  Result Date: 11/26/2018 CLINICAL DATA:  Sepsis and hypoxia EXAM: PORTABLE CHEST 1 VIEW COMPARISON:  06/23/2016 chest radiograph. FINDINGS: Stable cardiomediastinal silhouette with normal heart size. No pneumothorax. No pleural effusion. Mild patchy upper left lung opacity. IMPRESSION: Mild patchy upper left lung opacity, which could represent asymmetric mild pulmonary edema versus developing pneumonia. Chest radiograph follow-up suggested. Electronically Signed   By: Ilona Sorrel M.D.   On: 11/26/2018 04:14   I have discussed his case with Dr. Tressia Miners.  I have reviewed his CT films  and report.  I have reviewed his admission labs.  I have reviewed his recent Urology office notes and his prior urine culture.    Assessment: Recurrent UTI's with prostatic abscess and sepsis.   I am going to take him to the OR for I&D of the prostatic abscess.   I have reviewed the risks of bleeding, aggravation of the infection, injury to adjacent structures including the rectum which could result in a fistula, incontinence, retention, need for secondary procedures, thrombotic events, anesthetic complications and death.    Thrombocytopenia.  Plt count is 64k which should be adequate for surgery.     CC: Zara Council PA and Dr. Saundra Shelling.      Irine Seal 11/26/2018 [phone]

## 2018-11-26 NOTE — ED Notes (Signed)
Pt being transported to OR from ED

## 2018-11-26 NOTE — Consult Note (Signed)
Pharmacy Antibiotic Note  Mason Wilson. is a 78 y.o. male admitted on 11/26/2018 with sepsisof unclear etiology.  Pharmacy has been consulted for vancomycin and cefepime dosing.  He has had 4 urinary tract infections recently most recently completing a course of Bactrim and then Keflex about 2 weeks ago.The most recent culture was E coli resistant only to ampicillin. His SCr (1.6) is noted to be elevated above approximate baseline of 1.2 mg/dL  Plan:  1) Vancomycin 2000 mg loading dose then 1000 mg IV Q 24 hrs. Goal AUC 400-550. Expected AUC: 505.3 SCr used: 1.6  2) cefepime 2 grams IV every 12 hours  Height: 5\' 10"  (177.8 cm) Weight: 230 lb (104.3 kg) IBW/kg (Calculated) : 73  Temp (24hrs), Avg:99.8 F (37.7 C), Min:97.6 F (36.4 C), Max:102.6 F (39.2 C)  Recent Labs  Lab 11/26/18 0343 11/26/18 0345 11/26/18 0603  WBC 0.8*  --   --   CREATININE 1.60*  --   --   LATICACIDVEN  --  5.8* 2.8*    Estimated Creatinine Clearance: 47.5 mL/min (A) (by C-G formula based on SCr of 1.6 mg/dL (H)).    Allergies  Allergen Reactions  . Altace [Ramipril] Cough    Antimicrobials this admission: vancomycin 1/19 >>  cefepime 1/19 >>  Metronidazole 1/19 >>  Microbiology results: 1/19 BCx: pending 1/19 UCx: pending   Thank you for allowing pharmacy to be a part of this patient's care.  Dallie Piles, PharmD 11/26/2018 7:19 AM

## 2018-11-26 NOTE — Progress Notes (Signed)
Received pt from OR, pt placed on vent with ED settings. FiO2 increased due to decrease in oxygen sats. Pt vent connected to suction and subglottic suction. Vent plug in Red outlet, oxygent and air connected to appropriate outlets.

## 2018-11-26 NOTE — ED Provider Notes (Signed)
[facility] Emergency Department Provider Note  ____________________________________________   First MD Initiated Contact with Patient 11/26/18 [id number]     (approximate)  I have reviewed the triage vital signs and the nursing notes.   HISTORY  Chief Complaint Shortness of Breath; Nausea; Diarrhea; and Rectal Bleeding  Level 5 exemption history is limited by the patient's clinical condition  HPI Mason Wilson. is a 78 y.o. male comes to the emergency department via EMS with fever, shaking chills, abdominal pain, nausea, and vomiting for an unclear period of time.  The patient is confused on arrival and is unable to give a coherent history.  He says that he has had fevers and chills along with nausea and vomiting intermittently for about 3 years.  He has a complex past medical history including coronary artery disease, hypertension, and myasthenia gravis.  He has had 4 urinary tract infections recently most recently completing a course of Bactrim and then Keflex about 2 weeks ago.    Past Medical History:  Diagnosis Date  . Allergy   . Arthritis    "everywhere' - worse in back  . Body mass index 39.0-39.9, adult   . CAD (coronary artery disease)   . Chronic back pain   . Cirrhosis, non-alcoholic ([place])   . Diabetes mellitus without complication ([place])   . Elevated liver enzymes   . GERD (gastroesophageal reflux disease)   . Heart murmur   . Hyperlipidemia   . Hypertension   . Hypogonadism in male   . Melanoma ([place])    melanoma   . Melanoma in situ of right shoulder ([place])   . Myasthenia gravis ([place]) 01/2018  . Renal stones   . Sleep apnea    CPAP  . Uses hearing aid    doesn't wear    Patient Active Problem List   Diagnosis Date Noted  . Septic shock ([place]) 11/26/2018  . Senile purpura ([place]) 09/05/2018  . Portal hypertension ([place])   . Gastritis without bleeding   . Secondary esophageal varices without bleeding ([place])   . Benign neoplasm of  ascending colon   . Benign neoplasm of descending colon   . Iron deficiency anemia due to chronic blood loss 05/30/2018  . Splenomegaly 04/28/2018  . Myasthenia gravis without acute exacerbation ([place]) 03/02/2018  . Advanced care planning/counseling discussion 09/01/2017  . LFT elevation   . Essential hypertension 10/13/2015  . Back muscle spasm 09/15/2015  . 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 Hepatic cirrhosis ([place])   . Esophageal varices in cirrhosis (HCC)   . Cirrhosis ([place]) 08/11/2015  . Abnormal tumor markers 07/28/2015  . Other pancytopenia ([place]) 07/28/2015  . Weight loss 07/28/2015  . 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 Diabetes mellitus without complication ([place]) 65/99/3570  . CAD (coronary artery disease) 07/08/2015  . Chronic kidney disease 07/08/2015  . Hyperlipidemia 07/08/2015  . BMI 34.0-34.9,adult 07/08/2015  . BPH (benign prostatic hyperplasia) 07/08/2015  . Low back pain 07/08/2015    Past Surgical History:  Procedure Laterality Date  . ANGIOPLASTY  1999   3 stents  . CARDIAC CATHETERIZATION     stents placed  . CARPAL TUNNEL RELEASE     x2  . CATARACT EXTRACTION, BILATERAL    . COLONOSCOPY WITH PROPOFOL N/A 07/03/2018   Procedure: COLONOSCOPY WITH PROPOFOL;  Surgeon: Lucilla Lame, MD;  Location: [facility];  Service: Endoscopy;  Laterality: N/A;  . ESOPHAGOGASTRODUODENOSCOPY (EGD) WITH PROPOFOL N/A 09/08/2015  Procedure: ESOPHAGOGASTRODUODENOSCOPY (EGD) WITH PROPOFOL;  Surgeon: Lucilla Lame, MD;  Location: [facility];  Service: Endoscopy;  Laterality: N/A;  CPAP Diabetic - oral meds  . ESOPHAGOGASTRODUODENOSCOPY (EGD) WITH PROPOFOL N/A 07/03/2018   Procedure: ESOPHAGOGASTRODUODENOSCOPY (EGD) WITH PROPOFOL;  Surgeon: Lucilla Lame, MD;  Location: [facility];  Service: Endoscopy;  Laterality: N/A;  diabetic - oral meds sleep apnea  . LIVER BIOPSY    . POLYPECTOMY N/A 07/03/2018   Procedure:  POLYPECTOMY;  Surgeon: Lucilla Lame, MD;  Location: [facility];  Service: Endoscopy;  Laterality: N/A;  . SKIN LESION EXCISION  1998  . SKIN LESION EXCISION     skin cancer removed on forehead in June 2018  . TONSILLECTOMY     age 48    Prior to Admission medications   Medication Sig Start Date End Date Taking? Authorizing Provider  Calcium Carbonate-Vit D-Min (CALCIUM 1200 PO) Take 1,200 mg by mouth.   Yes [provider]  glipiZIDE (GLUCOTROL) 5 MG tablet Take 1 tablet (5 mg total) by mouth daily before breakfast. 09/05/18  Yes Crissman, Jeannette How, MD  hydrochlorothiazide (HYDRODIURIL) 25 MG tablet Take 1 tablet (25 mg total) by mouth daily. 09/05/18  Yes Crissman, Jeannette How, MD  losartan (COZAAR) 100 MG tablet Take 1 tablet (100 mg total) by mouth daily. 09/05/18  Yes [name], MD  metFORMIN (GLUCOPHAGE) 500 MG tablet Take 2 tablets (1,000 mg total) by mouth 2 (two) times daily with a meal. 09/05/18  Yes Crissman, Jeannette How, MD  metoprolol succinate (TOPROL-XL) 50 MG 24 hr tablet Take 1 tablet (50 mg total) by mouth daily. Take with or immediately following a meal. 09/05/18  Yes Crissman, Jeannette How, MD  omeprazole (PRILOSEC) 40 MG capsule Take 40 mg by mouth daily.  11/23/18  Yes [provider]  pyridostigmine (MESTINON) 60 MG tablet Take 60 mg by mouth 4 (four) times daily.   Yes [provider]  simvastatin (ZOCOR) 40 MG tablet Take 1 tablet (40 mg total) by mouth daily at 6 PM. 09/05/18  Yes Crissman, Jeannette How, MD  tamsulosin (FLOMAX) 0.4 MG CAPS capsule TAKE 1 CAPSULE(0.4 MG) BY MOUTH DAILY 08/09/18  Yes McGowan, Larene Beach A, PA-C  cephALEXin (KEFLEX) 500 MG capsule Take 1 capsule (500 mg total) by mouth 2 (two) times daily. Patient not taking: Reported on 11/26/2018 11/15/18   Zara Council A, PA-C  predniSONE (DELTASONE) 20 MG tablet  09/10/18   [provider]  sulfamethoxazole-trimethoprim (BACTRIM DS,SEPTRA DS) 800-160 MG tablet Take 1 tablet by  mouth every 12 (twelve) hours. Patient not taking: Reported on 11/26/2018 11/10/18   Zara Council A, PA-C    Allergies Altace [ramipril]  Family History  Problem Relation Age of Onset  . Thyroid disease Mother   . Alzheimer's disease Mother   . Heart disease Mother   . Cancer Father 58       testicular  . Heart disease Father   . Hypertension Father   . Heart attack Father   . Kidney disease Neg Hx   . Prostate cancer Neg Hx   . Kidney cancer Neg Hx   . Bladder Cancer Neg Hx     Social History Social History   Tobacco Use  . Smoking status: Former Smoker    Types: Cigarettes    Last attempt to quit: 04/02/1970    Years since quitting: 48.6  . Smokeless tobacco: Current User    Types: Chew  . Tobacco comment: approx quit in  1971   Substance Use Topics  . Alcohol use: No    Alcohol/week: 0.0 standard drinks    Comment: stopped in 1996  . Drug use: No    Review of Systems Level 5 exemption history is limited by the patient's critical illness ____________________________________________   PHYSICAL EXAM:  VITAL SIGNS: ED Triage Vitals  Enc Vitals Group     BP      Pulse      Resp      Temp      Temp src      SpO2      Weight      Height      Head Circumference      Peak Flow      Pain Score      Pain Loc      Pain Edu?      Excl. in [place]?     Constitutional: Appears critically ill.  Very elevated respiratory rate speaking in 1-2 word sentences using accessory muscles.  Diaphoretic and confused Eyes: PERRL EOMI. midrange and brisk Head: Atraumatic. Nose: No congestion/rhinnorhea. Mouth/Throat: No trismus Neck: No stridor.  No meningismus Cardiovascular: Tachycardic rate, regular rhythm. Grossly normal heart sounds.  Good peripheral circulation. Respiratory: Increased respiratory effort breathing 40-50 times a minute taking very shallow breaths with crackles throughout Gastrointestinal: Obese soft mild diffuse tenderness with no focality no rebound or  guarding no peritonitis Musculoskeletal: 2+ edema pitting bilaterally to mid shin Neurologic: Moves all 4 extremities and feels all 4 extremities.  4+ out of 5 strength globally.  No gross bulbar weakness Skin: Diaphoretic Psychiatric: Mildly delirious    ____________________________________________   DIFFERENTIAL includes but not limited to  Sepsis, septic shock, medication reaction, urinary tract infection, pyelonephritis, pneumonia, aspiration ____________________________________________   LABS (all labs ordered are listed, but only abnormal results are displayed)  Labs Reviewed  COMPREHENSIVE METABOLIC PANEL - Abnormal; Notable for the following components:      Result Value   Potassium 3.3 (*)    CO2 18 (*)    Glucose, Bld 126 (*)    BUN 63 (*)    Creatinine, Ser 1.60 (*)    Total Protein 6.3 (*)    Albumin 3.1 (*)    AST 43 (*)    ALT 100 (*)    Total Bilirubin 3.4 (*)    GFR calc non Af Amer 41 (*)    GFR calc Af Amer 48 (*)    All other components within normal limits  LIPASE, BLOOD - Abnormal; Notable for the following components:   Lipase 74 (*)    All other components within normal limits  CBC WITH DIFFERENTIAL/PLATELET - Abnormal; Notable for the following components:   WBC 0.8 (*)    RBC 3.18 (*)    Hemoglobin 9.7 (*)    HCT 29.8 (*)    Platelets 64 (*)    Neutro Abs 0.7 (*)    Lymphs Abs 0.1 (*)    Monocytes Absolute 0.0 (*)    All other components within normal limits  TROPONIN I - Abnormal; Notable for the following components:   Troponin I 0.06 (*)    All other components within normal limits  LACTIC ACID, PLASMA - Abnormal; Notable for the following components:   Lactic Acid, Venous 5.8 (*)    All other components within normal limits  LACTIC ACID, PLASMA - Abnormal; Notable for the following components:   Lactic Acid, Venous 2.8 (*)    All other components within  normal limits  PROTIME-INR - Abnormal; Notable for the following components:    Prothrombin Time 15.8 (*)    All other components within normal limits  URINALYSIS, ROUTINE W REFLEX MICROSCOPIC - Abnormal; Notable for the following components:   Color, Urine AMBER (*)    APPearance CLOUDY (*)    Protein, ur 30 (*)    Leukocytes, UA LARGE (*)    WBC, UA >50 (*)    Bacteria, UA FEW (*)    All other components within normal limits  BLOOD GAS, ARTERIAL - Abnormal; Notable for the following components:   pH, Arterial 7.30 (*)    pO2, Arterial 79 (*)    Bicarbonate 19.2 (*)    Acid-base deficit 6.7 (*)    All other components within normal limits  CULTURE, BLOOD (ROUTINE X 2)  CULTURE, BLOOD (ROUTINE X 2)  GASTROINTESTINAL PANEL BY PCR, STOOL (REPLACES STOOL CULTURE)  C DIFFICILE QUICK SCREEN W PCR REFLEX  URINE CULTURE  PROCALCITONIN  INFLUENZA PANEL BY PCR (TYPE A & B)  CORTISOL  TYPE AND SCREEN    Lab work reviewed by me with a number of critical abnormalities.  Urinalysis is concerning for infection.  Lactic acid of 5.8 consistent with septic shock although is clearing nicely to 2.8.  He has chronic pancytopenia however his hemoglobin and platelets are roughly at baseline and his white count now is 0.8 which is concerning for severe sepsis.  He does have some acute kidney injury.  Elevated lipase concerning for vomiting and possibly pancreatitis __________________________________________  EKG  ED ECG REPORT I, Darel Hong, the attending physician, personally viewed and interpreted this ECG.  Date: 11/26/2018 EKG Time:  Rate: 111 Rhythm: Sinus tachycardia QRS Axis: Leftward axis Intervals: normal ST/T Wave abnormalities: normal Narrative Interpretation: no evidence of acute ischemia  ____________________________________________  RADIOLOGY  First chest x-ray reviewed by me shows either asymmetric edema versus pneumonia Second chest x-ray reviewed by me shows endotracheal tube and orogastric tubes in good position although now is suggestive of  flash pulmonary edema CT scan of the chest abdomen pelvis reviewed by me concerning for edema versus pneumonia in the lungs and shows prostatitis along with multiple prostate abscesses ____________________________________________   PROCEDURES  Procedure(s) performed: Yes  .Critical Care Performed by: Darel Hong, MD Authorized by: Darel Hong, MD   Critical care provider statement:    Critical care time (minutes):  80   Critical care time was exclusive of:  Separately billable procedures and treating other patients   Critical care was necessary to treat or prevent imminent or life-threatening deterioration of the following conditions:  Respiratory failure, sepsis and shock   Critical care was time spent personally by me on the following activities:  Development of treatment plan with patient or surrogate, discussions with consultants, evaluation of patient's response to treatment, examination of patient, obtaining history from patient or surrogate, ordering and performing treatments and interventions, ordering and review of laboratory studies, ordering and review of radiographic studies, pulse oximetry, re-evaluation of patient's condition and review of old charts Procedure Name: Intubation Date/Time: 11/26/2018 8:29 AM Performed by: Darel Hong, MD Pre-anesthesia Checklist: Patient identified, Patient being monitored, Emergency Drugs available, Timeout performed and Suction available Oxygen Delivery Method: Nasal cannula Preoxygenation: Pre-oxygenation with 100% oxygen Induction Type: Rapid sequence Ventilation: Mask ventilation without difficulty Laryngoscope Size: Mac and 4 Grade View: Grade I Number of attempts: 1 Placement Confirmation: ETT inserted through vocal cords under direct vision,  CO2 detector and Breath sounds checked- equal  and bilateral Secured at: 24 cm Tube secured with: ETT holder    OG placement Date/Time: 11/26/2018 8:29 AM Performed by:  Darel Hong, MD Authorized by: Darel Hong, MD  Consent: The procedure was performed in an emergent situation. Patient tolerance: Patient tolerated the procedure well with no immediate complications  ARTERIAL BLOOD GAS Date/Time: 11/26/2018 8:29 AM Performed by: Darel Hong, MD Authorized by: Darel Hong, MD   Consent:    Consent obtained:  Emergent situation Procedure details:    Location:  R radial   Munoz's test performed: yes     Scalise's test abnormal: no     Number of attempts:  2 Post-procedure details:    Dressing applied: yes     Bleeding:  Manual pressure applied   Circulation, movement, and sensation:  Normal   Patient tolerance of procedure:  Tolerated well, no immediate complications Comments:     Respiratory therapy was unable to obtain an ABG secondary to the patient's vasopressor requirements so I performed the ABG under direct ultrasound guidance   Angiocath insertion Performed by: Darel Hong  Consent: Verbal consent obtained. Risks and benefits: risks, benefits and alternatives were discussed Time out: Immediately prior to procedure a "time out" was called to verify the correct patient, procedure, equipment, support staff and site/side marked as required.  Preparation: Patient was prepped and draped in the usual sterile fashion.  Vein Location: Right antecubital fossa  Ultrasound Guided  Gauge: 16  Normal blood return and flush without difficulty Patient tolerance: Patient tolerated the procedure well with no immediate complications.     Critical Care performed: Yes  ____________________________________________   INITIAL IMPRESSION / ASSESSMENT AND PLAN / ED COURSE  Pertinent labs & imaging results that were available during my care of the patient were reviewed by me and considered in my medical decision making (see chart for details).   As part of my medical decision making, I reviewed the following data within the  [place] History obtained from family if available, nursing notes, old chart and ekg, as well as notes from prior ED visits.  The patient comes to the emergency department critically ill-appearing febrile to 103 degrees tachycardic tachypneic and diaphoretic.  Unclear source so broad labs including lactic acid and cultures will be obtained and we will begin with 2 L of normal saline along with Tylenol and ibuprofen.  Broad-spectrum antibiotics with cefepime vancomycin and Flagyl now.  The patient does have a new oxygen requirement although is saturating in the low to mid 90s on nasal cannula.  He is breathing roughly 40 times a minute and reports subjective weakness around his mouth with his history of myasthenia is grazing my concern for impending respiratory collapse.  I discussed the possibility of intubation with the patient and his wife who understand the he could decompensate before he improves.  The patient's first chest x-ray is concerning for pneumonia versus atypical edema.  His lactic acid came back at 5.8 so I am giving him with 30 cc/kg of normal saline along with his broad-spectrum antibiotics.  I then discussed with the hospitalist for admission who indicated that the patient has required exchange transfusion in the past and he felt that the patient would be best served with transfer to [facility].     ----------------------------------------- 7:12 AM on 11/26/2018 -----------------------------------------  I initially spoke with the [facility] regarding transfer as the patient has been cared for there in the past however the MICU physician was unavailable as he  was in a procedure and they did not have any bed availability anyway so I touched base with [facility] and spoke with the MICU fellow Dr. Nira Retort has graciously agreed to accept the patient as a transfer on behalf of his attending Dr. Renelda Mom.  Unfortunately [facility] has no beds currently and the patient is on a waiting  list.  I will now reach out to [facility] to see if they have any ICU beds available. ____________________________________________  ----------------------------------------- 7:36 AM on 11/26/2018 ----------------------------------------- I spoke with [facility], ICU physician Dr. Lake Bells who told me that [facility] has no beds available in their intensive care unit however he felt that the patient should be suitable for admission to our facility as the possible plasma exchange would be for respiratory status and the patient is already intubated.    ----------------------------------------- 8:26 AM on 11/26/2018 -----------------------------------------  I discussed the case with [facility] hospitalist who has graciously agreed to admit the patient to her service.  I then discussed with on-call urologist Dr. Jeffie Pollock who will kindly come evaluate the patient.  The patient is obviously quite complex but he feels they may be benefit from transurethral drainage of the prostatic abscess.  The patient's wife has been updated throughout the course of his resuscitation and all questions have been answered.  He is admitted to our hospital in critical but significantly improved condition.   FINAL CLINICAL IMPRESSION(S) / ED DIAGNOSES  Final diagnoses:  Septic shock ([place])  Acute pancreatitis without infection or necrosis, unspecified pancreatitis type  Myasthenia gravis ([place])  Pneumonia of both lungs due to infectious organism, unspecified part of lung  Acute prostatitis  Prostate abscess      NEW MEDICATIONS STARTED DURING THIS VISIT:  New Prescriptions   No medications on file     Note:  This document was prepared using Dragon voice recognition software and may include unintentional dictation errors.    Darel Hong, MD 11/26/18 [phone]

## 2018-11-26 NOTE — Progress Notes (Signed)
Per Patria Mane, NP instruction:   Started dopamine, vaso off.  Will attempt to titrate levo of off and substitute with Dopamine.  Propofol also wean to off - prn versed.

## 2018-11-26 NOTE — Progress Notes (Signed)
   11/26/18 0800  Clinical Encounter Type  Visited With Patient and family together  Visit Type Follow-up;ED  Referral From Chaplain  Spiritual Encounters  Spiritual Needs Emotional  Pt was under sedative. Wife was present, emotionally startled and concerned. Ch offered a compassionate presence and emotional support. Ch left after wife was able to hear the diagnosis and further care plan. Wife will contact two adult sons for support.

## 2018-11-26 NOTE — ED Notes (Signed)
CRITICAL LAB: TROPONIN is 0.06, [facility], Dr. Mable Paris notified, orders received

## 2018-11-26 NOTE — ED Notes (Signed)
Verbal order from Dr. Mable Paris at bedside to give propofol 50 mg bolus.

## 2018-11-26 NOTE — ED Notes (Addendum)
Per Dr. Mable Paris, can begin to titrate down on levophed drip by 5 mcg/min

## 2018-11-26 NOTE — ED Notes (Signed)
Attempted to call report, RN unable to take report at this time.

## 2018-11-26 NOTE — ED Notes (Signed)
Pt desats to 88% on RA and reports discomfort

## 2018-11-26 NOTE — Progress Notes (Signed)
CODE SEPSIS - PHARMACY COMMUNICATION  **Broad Spectrum Antibiotics should be administered within 1 hour of Sepsis diagnosis**  Time Code Sepsis Called/Page Received: 0339  Antibiotics Ordered: vancomycin, cefepime and Flagyl  Time of 1st antibiotic administration: 0409  Additional action taken by pharmacy: none required  If necessary, Name of Provider/Nurse Contacted: N/A    Dallie Piles ,PharmD Clinical Pharmacist  11/26/2018  7:16 AM

## 2018-11-26 NOTE — ED Notes (Signed)
CRITICAL LAB: WBC is 0.8 and LACTIC is 5.8, [facility], Dr. Mable Paris notified, orders received

## 2018-11-26 NOTE — ED Notes (Signed)
Respiratory called to come suction patient

## 2018-11-26 NOTE — Transfer of Care (Signed)
Immediate Anesthesia Transfer of Care Note  Patient: Mason Wilson.  Procedure(s) Performed: TRANSURETHRAL RESECTION OF THE PROSTATE (TURP) (N/A )  Patient Location: ICU  Anesthesia Type:General  Level of Consciousness: sedated  Airway & Oxygen Therapy: Patient remains intubated per anesthesia plan  Post-op Assessment: Post -op Vital signs reviewed and stable  Post vital signs: stable  Last Vitals:  Vitals Value Taken Time  BP 84/47 11/26/2018 11:34 AM  Temp    Pulse 83 11/26/2018 11:35 AM  Resp 5 11/26/2018 11:34 AM  SpO2 97 % 11/26/2018 11:35 AM  Vitals shown include unvalidated device data.  Last Pain:  Vitals:   11/26/18 0608  TempSrc:   PainSc: Asleep         Complications: No apparent anesthesia complications

## 2018-11-26 NOTE — H&P (Signed)
[facility] NAME: Mason Wilson    MR#:  [id number]  DATE OF BIRTH:  05-10-1941  DATE OF ADMISSION:  11/26/2018  PRIMARY CARE PHYSICIAN: [name], MD   REQUESTING/REFERRING PHYSICIAN:   CHIEF COMPLAINT:   Chief Complaint  Patient presents with  . Shortness of Breath  . Nausea  . Diarrhea  . Rectal Bleeding    HISTORY OF PRESENT ILLNESS: Mason Wilson  is a 78 y.o. male with a known history of hyperlipidemia, hypertension, melanoma, hypogonadism, sleep apnea presented to the emergency room for fever, shaking chills and abdominal discomfort.  Patient also had some nausea and vomiting.  Patient was altered and confused when he presented to the emergency room.  History of urinary tract infections in the past and recently completed a course of antibiotics that is Bactrim and Keflex.  Patient was evaluated in the emergency room he was found to be septic.  He was given 4 L IV fluids.  Patient was worked up with CT abdomen which showed abscess in the prostate.  Urology consultation was done.  CT scanning of the chest and abdomen also showed pneumonia.  Patient started on broad-spectrum IV antibiotics.  After receiving the fluids secondary to pulmonary edema and respiratory distress he was intubated and put on ventilator.  Patient was started on IV milrinone drip.  Hospitalist service was consulted.  PAST MEDICAL HISTORY:   Past Medical History:  Diagnosis Date  . Allergy   . Arthritis    "everywhere' - worse in back  . Body mass index 39.0-39.9, adult   . CAD (coronary artery disease)   . Chronic back pain   . Cirrhosis, non-alcoholic ([place])   . Diabetes mellitus without complication ([place])   . Elevated liver enzymes   . GERD (gastroesophageal reflux disease)   . Heart murmur   . Hyperlipidemia   . Hypertension   . Hypogonadism in male   . Melanoma ([place])    melanoma   . Melanoma in situ of right shoulder ([place])   .  Myasthenia gravis ([place]) 01/2018  . Renal stones   . Sleep apnea    CPAP  . Uses hearing aid    doesn't wear    PAST SURGICAL HISTORY:  Past Surgical History:  Procedure Laterality Date  . ANGIOPLASTY  1999   3 stents  . CARDIAC CATHETERIZATION     stents placed  . CARPAL TUNNEL RELEASE     x2  . CATARACT EXTRACTION, BILATERAL    . COLONOSCOPY WITH PROPOFOL N/A 07/03/2018   Procedure: COLONOSCOPY WITH PROPOFOL;  Surgeon: Lucilla Lame, MD;  Location: [facility];  Service: Endoscopy;  Laterality: N/A;  . ESOPHAGOGASTRODUODENOSCOPY (EGD) WITH PROPOFOL N/A 09/08/2015   Procedure: ESOPHAGOGASTRODUODENOSCOPY (EGD) WITH PROPOFOL;  Surgeon: Lucilla Lame, MD;  Location: [facility];  Service: Endoscopy;  Laterality: N/A;  CPAP Diabetic - oral meds  . ESOPHAGOGASTRODUODENOSCOPY (EGD) WITH PROPOFOL N/A 07/03/2018   Procedure: ESOPHAGOGASTRODUODENOSCOPY (EGD) WITH PROPOFOL;  Surgeon: Lucilla Lame, MD;  Location: [facility];  Service: Endoscopy;  Laterality: N/A;  diabetic - oral meds sleep apnea  . LIVER BIOPSY    . POLYPECTOMY N/A 07/03/2018   Procedure: POLYPECTOMY;  Surgeon: Lucilla Lame, MD;  Location: [facility];  Service: Endoscopy;  Laterality: N/A;  . SKIN LESION EXCISION  1998  . SKIN LESION EXCISION     skin cancer removed on forehead in June 2018  . TONSILLECTOMY  age 87    SOCIAL HISTORY:  Social History   Tobacco Use  . Smoking status: Former Smoker    Types: Cigarettes    Last attempt to quit: 04/02/1970    Years since quitting: 48.6  . Smokeless tobacco: Current User    Types: Chew  . Tobacco comment: approx quit in 1971   Substance Use Topics  . Alcohol use: No    Alcohol/week: 0.0 standard drinks    Comment: stopped in 1996    FAMILY HISTORY:  Family History  Problem Relation Age of Onset  . Thyroid disease Mother   . Alzheimer's disease Mother   . Heart disease Mother   . Cancer Father 57       testicular  . Heart  disease Father   . Hypertension Father   . Heart attack Father   . Kidney disease Neg Hx   . Prostate cancer Neg Hx   . Kidney cancer Neg Hx   . Bladder Cancer Neg Hx     DRUG ALLERGIES:  Allergies  Allergen Reactions  . Altace [Ramipril] Cough    REVIEW OF SYSTEMS:  Could not be obtained as patient is critically ill and on ventilator  MEDICATIONS AT HOME:  Prior to Admission medications   Medication Sig Start Date End Date Taking? Authorizing Provider  Calcium Carbonate-Vit D-Min (CALCIUM 1200 PO) Take 1,200 mg by mouth.   Yes [provider]  glipiZIDE (GLUCOTROL) 5 MG tablet Take 1 tablet (5 mg total) by mouth daily before breakfast. 09/05/18  Yes Crissman, Jeannette How, MD  hydrochlorothiazide (HYDRODIURIL) 25 MG tablet Take 1 tablet (25 mg total) by mouth daily. 09/05/18  Yes Crissman, Jeannette How, MD  losartan (COZAAR) 100 MG tablet Take 1 tablet (100 mg total) by mouth daily. 09/05/18  Yes [name], MD  metFORMIN (GLUCOPHAGE) 500 MG tablet Take 2 tablets (1,000 mg total) by mouth 2 (two) times daily with a meal. 09/05/18  Yes Crissman, Jeannette How, MD  metoprolol succinate (TOPROL-XL) 50 MG 24 hr tablet Take 1 tablet (50 mg total) by mouth daily. Take with or immediately following a meal. 09/05/18  Yes Crissman, Jeannette How, MD  omeprazole (PRILOSEC) 40 MG capsule Take 40 mg by mouth daily.  11/23/18  Yes [provider]  pyridostigmine (MESTINON) 60 MG tablet Take 60 mg by mouth 4 (four) times daily.   Yes [provider]  simvastatin (ZOCOR) 40 MG tablet Take 1 tablet (40 mg total) by mouth daily at 6 PM. 09/05/18  Yes Crissman, Jeannette How, MD  tamsulosin (FLOMAX) 0.4 MG CAPS capsule TAKE 1 CAPSULE(0.4 MG) BY MOUTH DAILY 08/09/18  Yes McGowan, Larene Beach A, PA-C  cephALEXin (KEFLEX) 500 MG capsule Take 1 capsule (500 mg total) by mouth 2 (two) times daily. Patient not taking: Reported on 11/26/2018 11/15/18   Zara Council A, PA-C  predniSONE (DELTASONE) 20 MG tablet   09/10/18   [provider]  sulfamethoxazole-trimethoprim (BACTRIM DS,SEPTRA DS) 800-160 MG tablet Take 1 tablet by mouth every 12 (twelve) hours. Patient not taking: Reported on 11/26/2018 11/10/18   Zara Council A, PA-C      PHYSICAL EXAMINATION:   VITAL SIGNS: Blood pressure 101/62, pulse 80, temperature 100.3 F (37.9 C), resp. rate (!) 25, height 5\' 10"  (1.778 m), weight 104.3 kg, SpO2 96 %.  GENERAL:  78 y.o.-year-old patient lying in the bed on mechanical ventilator EYES: Pupils equal, round, reactive to light and accommodation. No scleral icterus.  HEENT: Head atraumatic, normocephalic.  ET tube noted NECK:  Supple, no jugular venous distention. No thyroid enlargement, no tenderness.  LUNGS: Creased breath sounds bilaterally, Rales heard in both lungs.  On mechanical ventilator CARDIOVASCULAR: S1, S2 normal. No murmurs, rubs, or gallops.  ABDOMEN: Soft, nontender, nondistended. Bowel sounds present. No organomegaly or mass.  EXTREMITIES: No pedal edema, cyanosis, or clubbing.  NEUROLOGIC: Patient is is on IV propofol drip for sedation Unable to obtain complete nervous system exam PSYCHIATRIC: The patient is alert and oriented x none SKIN: No obvious rash, lesion, or ulcer.   LABORATORY PANEL:   CBC Recent Labs  Lab 11/26/18 0343  WBC 0.8*  HGB 9.7*  HCT 29.8*  PLT 64*  MCV 93.7  MCH 30.5  MCHC 32.6  RDW 15.2  LYMPHSABS 0.1*  MONOABS 0.0*  EOSABS 0.0  BASOSABS 0.0   ------------------------------------------------------------------------------------------------------------------  Chemistries  Recent Labs  Lab 11/26/18 0343  NA 141  K 3.3*  CL 111  CO2 18*  GLUCOSE 126*  BUN 63*  CREATININE 1.60*  CALCIUM 8.9  AST 43*  ALT 100*  ALKPHOS 113  BILITOT 3.4*   ------------------------------------------------------------------------------------------------------------------ estimated creatinine clearance is 47.5 mL/min (A) (by C-G formula  based on SCr of 1.6 mg/dL (H)). ------------------------------------------------------------------------------------------------------------------ No results for input(s): TSH, T4TOTAL, T3FREE, THYROIDAB in the last 72 hours.  Invalid input(s): FREET3   Coagulation profile Recent Labs  Lab 11/26/18 0352  INR 1.27   ------------------------------------------------------------------------------------------------------------------- No results for input(s): DDIMER in the last 72 hours. -------------------------------------------------------------------------------------------------------------------  Cardiac Enzymes Recent Labs  Lab 11/26/18 0343  TROPONINI 0.06*   ------------------------------------------------------------------------------------------------------------------ Invalid input(s): POCBNP  ---------------------------------------------------------------------------------------------------------------  Urinalysis    Component Value Date/Time   COLORURINE AMBER (A) 11/26/2018 0352   APPEARANCEUR CLOUDY (A) 11/26/2018 0352   APPEARANCEUR Cloudy (A) 11/10/2018 1301   LABSPEC 1.021 11/26/2018 0352   PHURINE 5.0 11/26/2018 0352   GLUCOSEU NEGATIVE 11/26/2018 0352   HGBUR NEGATIVE 11/26/2018 0352   BILIRUBINUR NEGATIVE 11/26/2018 0352   BILIRUBINUR Negative 11/10/2018 1301   KETONESUR NEGATIVE 11/26/2018 0352   PROTEINUR 30 (A) 11/26/2018 0352   NITRITE NEGATIVE 11/26/2018 0352   LEUKOCYTESUR LARGE (A) 11/26/2018 0352   LEUKOCYTESUR Trace (A) 11/10/2018 1301     RADIOLOGY: Ct Chest W Contrast  Result Date: 11/26/2018 CLINICAL DATA:  Sepsis. Dyspnea. Nausea. Diarrhea. Abdominal distention. EXAM: CT CHEST, ABDOMEN, AND PELVIS WITH CONTRAST TECHNIQUE: Multidetector CT imaging of the chest, abdomen and pelvis was performed following the standard protocol during bolus administration of intravenous contrast. CONTRAST:  148mL OMNIPAQUE IOHEXOL 300 MG/ML  SOLN COMPARISON:   35019 chest CT.  08/01/2015 CT abdomen/pelvis. FINDINGS: CT CHEST FINDINGS Cardiovascular: Top-normal heart size. No significant pericardial effusion/thickening. 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 Atherosclerotic nonaneurysmal thoracic aorta. Top-normal caliber main pulmonary artery (3.4 cm diameter). No central pulmonary emboli. Mediastinum/Nodes: No discrete thyroid nodules. Unremarkable esophagus. No pathologically enlarged axillary, mediastinal or hilar lymph nodes. Lungs/Pleura: No pneumothorax. No pleural effusion. There is relatively symmetric patchy parahilar consolidation and ground-glass opacity in both lungs, upper lobe predominant. No lung masses. Musculoskeletal: No aggressive appearing focal osseous lesions. Moderate thoracic spondylosis. CT ABDOMEN PELVIS FINDINGS Hepatobiliary: Diffusely irregular liver surface compatible with hepatic cirrhosis. No liver masses. Normal gallbladder with no radiopaque cholelithiasis. No biliary ductal dilatation. Pancreas: There is diffuse haziness of the peripancreatic fat extending into anterior paranephric retroperitoneal spaces bilaterally, suggestive of acute pancreatitis. No peripancreatic fluid collections. Preserved pancreatic parenchymal enhancement. No pancreatic mass or duct dilation. Spleen: Mild to moderate splenomegaly with craniocaudal splenic length  16.2 cm. No splenic mass. Adrenals/Urinary Tract: Normal adrenals. No hydronephrosis. Simple 2.3 cm lateral lower right renal cyst. Normal bladder. Stomach/Bowel: Normal non-distended stomach. Normal caliber small bowel with no small bowel wall thickening. Normal appendix. Normal large bowel with no diverticulosis, large bowel wall thickening or pericolonic fat stranding. Vascular/Lymphatic: Atherosclerotic nonaneurysmal abdominal aorta. Patent portal, splenic, hepatic and renal veins. No pathologically enlarged lymph nodes in the abdomen or pelvis. Reproductive: Moderate-to-marked  prostatomegaly with bilateral prostatic fluid collections measuring 3.3 x 3.0 cm on the right and 4.0 x 2.9 cm on the left, new since 08/01/2015 CT. Haziness of the periprostatic fat. Other: No pneumoperitoneum, ascites or focal fluid collection. Musculoskeletal: No aggressive appearing focal osseous lesions. Moderate lumbar spondylosis. IMPRESSION: 1. Relatively symmetric patchy parahilar consolidation and ground-glass opacity in both lungs, upper lobe predominant. Favor multilobar bronchopneumonia, although flash pulmonary edema could also have this appearance. 2. Acute uncomplicated pancreatitis. 3. 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 4. Hepatic cirrhosis.  Splenomegaly.  No ascites. 5. 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 6.  Aortic Atherosclerosis (ICD10-I70.0). Electronically Signed   By: Ilona Sorrel M.D.   On: 11/26/2018 05:52   Ct Abdomen Pelvis W Contrast  Result Date: 11/26/2018 CLINICAL DATA:  Sepsis. Dyspnea. Nausea. Diarrhea. Abdominal distention. EXAM: CT CHEST, ABDOMEN, AND PELVIS WITH CONTRAST TECHNIQUE: Multidetector CT imaging of the chest, abdomen and pelvis was performed following the standard protocol during bolus administration of intravenous contrast. CONTRAST:  140mL OMNIPAQUE IOHEXOL 300 MG/ML  SOLN COMPARISON:  35019 chest CT.  08/01/2015 CT abdomen/pelvis. FINDINGS: CT CHEST FINDINGS Cardiovascular: Top-normal heart size. No significant pericardial effusion/thickening. 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 Atherosclerotic nonaneurysmal thoracic aorta. Top-normal caliber main pulmonary artery (3.4 cm diameter). No central pulmonary emboli. Mediastinum/Nodes: No discrete thyroid nodules. Unremarkable esophagus. No pathologically enlarged axillary, mediastinal or hilar lymph nodes.  Lungs/Pleura: No pneumothorax. No pleural effusion. There is relatively symmetric patchy parahilar consolidation and ground-glass opacity in both lungs, upper lobe predominant. No lung masses. Musculoskeletal: No aggressive appearing focal osseous lesions. Moderate thoracic spondylosis. CT ABDOMEN PELVIS FINDINGS Hepatobiliary: Diffusely irregular liver surface compatible with hepatic cirrhosis. No liver masses. Normal gallbladder with no radiopaque cholelithiasis. No biliary ductal dilatation. Pancreas: There is diffuse haziness of the peripancreatic fat extending into anterior paranephric retroperitoneal spaces bilaterally, suggestive of acute pancreatitis. No peripancreatic fluid collections. Preserved pancreatic parenchymal enhancement. No pancreatic mass or duct dilation. Spleen: Mild to moderate splenomegaly with craniocaudal splenic length 16.2 cm. No splenic mass. Adrenals/Urinary Tract: Normal adrenals. No hydronephrosis. Simple 2.3 cm lateral lower right renal cyst. Normal bladder. Stomach/Bowel: Normal non-distended stomach. Normal caliber small bowel with no small bowel wall thickening. Normal appendix. Normal large bowel with no diverticulosis, large bowel wall thickening or pericolonic fat stranding. Vascular/Lymphatic: Atherosclerotic nonaneurysmal abdominal aorta. Patent portal, splenic, hepatic and renal veins. No pathologically enlarged lymph nodes in the abdomen or pelvis. Reproductive: Moderate-to-marked prostatomegaly with bilateral prostatic fluid collections measuring 3.3 x 3.0 cm on the right and 4.0 x 2.9 cm on the left, new since 08/01/2015 CT. Haziness of the periprostatic fat. Other: No pneumoperitoneum, ascites or focal fluid collection. Musculoskeletal: No aggressive appearing focal osseous lesions. Moderate lumbar spondylosis. IMPRESSION: 1. Relatively symmetric patchy parahilar consolidation and ground-glass opacity in both lungs, upper lobe predominant. Favor multilobar  bronchopneumonia, although flash pulmonary edema could also have this appearance. 2. Acute  uncomplicated pancreatitis. 3. 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 4. Hepatic cirrhosis.  Splenomegaly.  No ascites. 5. 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 6.  Aortic Atherosclerosis (ICD10-I70.0). Electronically Signed   By: Ilona Sorrel M.D.   On: 11/26/2018 05:52   Dg Chest Portable 1 View  Result Date: 11/26/2018 CLINICAL DATA:  Intubated EXAM: PORTABLE CHEST 1 VIEW COMPARISON:  Chest radiograph from earlier today. FINDINGS: Endotracheal tube tip is 4.2 cm above the carina. Enteric tube enters stomach with the tip not seen on this image. Stable cardiomediastinal silhouette with borderline mild cardiomegaly. No pneumothorax. No pleural effusion. Rapidly worsening fluffy parahilar opacities in both lungs. IMPRESSION: 1. Well-positioned endotracheal and enteric tubes. 2. Rapidly worsening fluffy parahilar opacities in both lungs and borderline mild cardiomegaly, compatible with congestive heart failure with flash pulmonary edema. Electronically Signed   By: Ilona Sorrel M.D.   On: 11/26/2018 06:33   Dg Chest Port 1 View  Result Date: 11/26/2018 CLINICAL DATA:  Sepsis and hypoxia EXAM: PORTABLE CHEST 1 VIEW COMPARISON:  06/23/2016 chest radiograph. FINDINGS: Stable cardiomediastinal silhouette with normal heart size. No pneumothorax. No pleural effusion. Mild patchy upper left lung opacity. IMPRESSION: Mild patchy upper left lung opacity, which could represent asymmetric mild pulmonary edema versus developing pneumonia. Chest radiograph follow-up suggested. Electronically Signed   By: Ilona Sorrel M.D.   On: 11/26/2018 04:14    EKG: Orders placed or performed during the hospital encounter of 11/26/18  . EKG 12-Lead  . EKG  12-Lead    IMPRESSION AND PLAN:  78 year old male patient with history of hypertension, hyperlipidemia, melanoma, hypogonadism presented to the emergency room for fever, chills, nausea abdominal discomfort  -Acute septic shock Admit patient to ICU IV pressor medications Broad-spectrum antibiotics Follow-up cultures IV fluids as permitted as patient has pulmonary edema  -Acute respiratory failure with hypoxia Continue mechanical ventilation Intensivist consult Patient has pulmonary edema from IV fluid resuscitation Close monitoring  -Prostate abscess Start patient on IV vancomycin and cefepime antibiotics Urology consultation for drainage Follow-up cultures  -Pneumonia Continue broad-spectrum IV antibiotics Follow-up cultures  -Acute pancreatitis without necrosis Check lipase levels N.p.o. for now  -Thrombocytopenia Could be reactive Monitor platelet counts Avoid blood thinner medication  -Myasthenia gravis Supportive care  All the records are reviewed and case discussed with ED provider. Management plans discussed with the patient, family and they are in agreement.  CODE STATUS:Full code Code Status History    Date Active Date Inactive Code Status Order ID Comments User Context   06/23/2016 1427 06/25/2016 1722 Full Code [id number]  Epifanio Lesches, MD ED       TOTAL CRITICAL CARE TIME TAKING CARE OF THIS PATIENT: 53 minutes.    Saundra Shelling M.D on 11/26/2018 at 11:02 AM  Between 7am to 6pm - Pager - [phone]  After 6pm go to [email or web address] - password [email or web address] [facility]  Office  [phone]  CC: Primary care physician; [name], MD

## 2018-11-26 NOTE — Progress Notes (Signed)
Pt awake at bedside.  Pt reaching for ETT. Patria Mane, NP at  Bedside,  Propofol increased to 30mcg/kg/min.  Fentanyl 136mcg IVP bolus x1 per order

## 2018-11-26 NOTE — Procedures (Signed)
Central Venous Catheter Placement:TRIPLE LUMEN Indication: Patient receiving vesicant or irritant drug.;  Consent:emergent/wife    Hand washing performed prior to starting the procedure.   Procedure:   An active timeout was performed and correct patient, name, & ID confirmed.   Patient was positioned correctly for central venous access.  Patient was prepped using strict sterile technique including chlorohexadine preps, sterile drape, sterile gown and sterile gloves.    The area was prepped, draped and anesthetized in the usual sterile manner. Patient comfort was obtained.    A triple lumen catheter was placed in right  Internal Jugular Vein. Ultrasound guidance was used. There was good blood return, catheter caps were placed on lumens, catheter flushed easily, the line was secured and a sterile dressing and BIO-PATCH applied.   Ultrasound was used to visualize vasculature and guidance of needle.   Complications:none Estimated Blood Loss: none Chest Radiograph indicated and ordered. Showed no pneumothorax with line in good position Operator:Dannell Gortney   Rosine Door, MD  11/26/2018 2:08 PM [facility]

## 2018-11-26 NOTE — ED Notes (Signed)
ED TO INPATIENT HANDOFF REPORT  Name/Age/Gender Mason Wilson. 78 y.o. male  Code Status Code Status History    Date Active Date Inactive Code Status Order ID Comments User Context   06/23/2016 1427 06/25/2016 1722 Full Code [id number]  Epifanio Lesches, MD ED      Home/SNF/Other Unknown at this time   Chief Complaint Fever  Level of Care/Admitting Diagnosis ED Disposition    ED Disposition Condition [place]: [place] [[id number]]  Level of Care: ICU [6]  Diagnosis: Septic shock [facility]) [[id number]]  Admitting Physician: Saundra Shelling [[id number]]  Attending Physician: Saundra Shelling [[id number]]  Estimated length of stay: past midnight tomorrow  Certification:: I certify this patient will need inpatient services for at least 2 midnights  PT Class (Do Not Modify): Inpatient [101]  PT Acc Code (Do Not Modify): Private [1]       Medical History Past Medical History:  Diagnosis Date  . Allergy   . Arthritis    "everywhere' - worse in back  . Body mass index 39.0-39.9, adult   . CAD (coronary artery disease)   . Chronic back pain   . Cirrhosis, non-alcoholic ([place])   . Diabetes mellitus without complication ([place])   . Elevated liver enzymes   . GERD (gastroesophageal reflux disease)   . Heart murmur   . Hyperlipidemia   . Hypertension   . Hypogonadism in male   . Melanoma ([place])    melanoma   . Melanoma in situ of right shoulder ([place])   . Myasthenia gravis ([place]) 01/2018  . Renal stones   . Sleep apnea    CPAP  . Uses hearing aid    doesn't wear    Allergies Allergies  Allergen Reactions  . Altace [Ramipril] Cough    IV Location/Drains/Wounds Patient Lines/Drains/Airways Status   Active Line/Drains/Airways    Name:   Placement date:   Placement time:   Site:   Days:   Peripheral IV 11/26/18 Left Hand   11/26/18    0330    Hand   less than 1   Peripheral IV 11/26/18 Left Forearm   11/26/18    0340    Forearm   less  than 1   Peripheral IV 11/26/18 Right Antecubital   11/26/18    0450    Antecubital   less than 1   Peripheral IV 11/26/18 Right Hand   11/26/18    0541    Hand   less than 1   NG/OG Tube Orogastric 16 Fr. Right mouth Aucultation Measured external length of tube 57 cm   11/26/18    0540    Right mouth   less than 1   Urethral Catheter Jan NT Temperature probe;Latex 16 Fr.   11/26/18    0555    Temperature probe;Latex   less than 1   Airway 8 mm   11/26/18    0600     less than 1   Incision (Closed) 07/03/18 Rectum Other (Comment)   07/03/18    0757     146   Incision (Closed) 07/03/18 Throat Other (Comment)   07/03/18    0758     146          Labs/Imaging Results for orders placed or performed during the hospital encounter of 11/26/18 (from the past 48 hour(s))  Comprehensive metabolic panel     Status: Abnormal   Collection Time: 11/26/18  3:43 AM  Result Value  Ref Range   Sodium 141 135 - 145 mmol/L   Potassium 3.3 (L) 3.5 - 5.1 mmol/L   Chloride 111 98 - 111 mmol/L   CO2 18 (L) 22 - 32 mmol/L   Glucose, Bld 126 (H) 70 - 99 mg/dL   BUN 63 (H) 8 - 23 mg/dL   Creatinine, Ser 1.60 (H) 0.61 - 1.24 mg/dL   Calcium 8.9 8.9 - 10.3 mg/dL   Total Protein 6.3 (L) 6.5 - 8.1 g/dL   Albumin 3.1 (L) 3.5 - 5.0 g/dL   AST 43 (H) 15 - 41 U/L   ALT 100 (H) 0 - 44 U/L   Alkaline Phosphatase 113 38 - 126 U/L   Total Bilirubin 3.4 (H) 0.3 - 1.2 mg/dL   GFR calc non Af Amer 41 (L) >60 mL/min   GFR calc Af Amer 48 (L) >60 mL/min   Anion gap 12 5 - 15    Comment: Performed at [facility], [address] [place], [place] [address]  Lipase, blood     Status: Abnormal   Collection Time: 11/26/18  3:43 AM  Result Value Ref Range   Lipase 74 (H) 11 - 51 U/L    Comment: Performed at [facility], [address] [place], [place] [address]  CBC with Differential     Status: Abnormal   Collection Time: 11/26/18  3:43 AM  Result Value Ref Range   WBC 0.8 (LL) 4.0 - 10.5 K/uL     Comment: This critical result has verified and been called to NOEL WEBSTER by Silvana Newness on 01 19 2020 at 0434, and has been read back.    RBC 3.18 (L) 4.22 - 5.81 MIL/uL   Hemoglobin 9.7 (L) 13.0 - 17.0 g/dL   HCT 29.8 (L) 39.0 - 52.0 %   MCV 93.7 80.0 - 100.0 fL   MCH 30.5 26.0 - 34.0 pg   MCHC 32.6 30.0 - 36.0 g/dL   RDW 15.2 11.5 - 15.5 %   Platelets 64 (L) 150 - 400 K/uL    Comment: Immature Platelet Fraction may be clinically indicated, consider ordering this additional test [id number]    nRBC 0.0 0.0 - 0.2 %   Neutrophils Relative % 92 %   Neutro Abs 0.7 (L) 1.7 - 7.7 K/uL   Lymphocytes Relative 6 %   Lymphs Abs 0.1 (L) 0.7 - 4.0 K/uL   Monocytes Relative 1 %   Monocytes Absolute 0.0 (L) 0.1 - 1.0 K/uL   Eosinophils Relative 0 %   Eosinophils Absolute 0.0 0.0 - 0.5 K/uL   Basophils Relative 0 %   Basophils Absolute 0.0 0.0 - 0.1 K/uL   WBC Morphology MORPHOLOGY UNREMARKABLE    RBC Morphology MORPHOLOGY UNREMARKABLE    Smear Review Normal platelet morphology    Immature Granulocytes 1 %   Abs Immature Granulocytes 0.01 0.00 - 0.07 K/uL    Comment: Performed at [facility], [address] [place], [place] [address]  Troponin I - Once     Status: Abnormal   Collection Time: 11/26/18  3:43 AM  Result Value Ref Range   Troponin I 0.06 (HH) <0.03 ng/mL    Comment: CRITICAL RESULT CALLED TO, READ BACK BY AND VERIFIED WITH NOEL WEBSTER AT 0454 11/26/2018.  TFK Performed at [facility], [place], [place] [address]   Lactic acid, plasma     Status: Abnormal   Collection Time: 11/26/18  3:45 AM  Result Value Ref Range  Lactic Acid, Venous 5.8 (HH) 0.5 - 1.9 mmol/L    Comment: CRITICAL RESULT CALLED TO, READ BACK BY AND VERIFIED WITH NOEL WEBSTER AT 0434 11/26/2018.  TFK Performed at [facility], [address] [place], [place] [address]   Culture, blood (routine x 2)     Status: None (Preliminary result)   Collection  Time: 11/26/18  3:45 AM  Result Value Ref Range   Specimen Description BLOOD LEFT HAND    Special Requests      BOTTLES DRAWN AEROBIC AND ANAEROBIC Blood Culture results may not be optimal due to an excessive volume of blood received in culture bottles   Culture      NO GROWTH < 12 HOURS Performed at [facility], [address], [place], [place] [address]    Report Status PENDING   Culture, blood (routine x 2)     Status: None (Preliminary result)   Collection Time: 11/26/18  3:45 AM  Result Value Ref Range   Specimen Description BLOOD LEFT ARM    Special Requests      BOTTLES DRAWN AEROBIC AND ANAEROBIC Blood Culture results may not be optimal due to an excessive volume of blood received in culture bottles   Culture      NO GROWTH < 12 HOURS Performed at [facility], [address] [place], [place] [address]    Report Status PENDING   Procalcitonin     Status: None   Collection Time: 11/26/18  3:52 AM  Result Value Ref Range   Procalcitonin 20.47 ng/mL    Comment:        Interpretation: PCT >= 10 ng/mL: Important systemic inflammatory response, almost exclusively due to severe bacterial sepsis or septic shock. (NOTE)       Sepsis PCT Algorithm           Lower Respiratory Tract                                      Infection PCT Algorithm    ----------------------------     ----------------------------         PCT < 0.25 ng/mL                PCT < 0.10 ng/mL         Strongly encourage             Strongly discourage   discontinuation of antibiotics    initiation of antibiotics    ----------------------------     -----------------------------       PCT 0.25 - 0.50 ng/mL            PCT 0.10 - 0.25 ng/mL               OR       >80% decrease in PCT            Discourage initiation of                                            antibiotics      Encourage discontinuation           of antibiotics    ----------------------------      -----------------------------         PCT >= 0.50 ng/mL  PCT 0.26 - 0.50 ng/mL                AND       <80% decrease in PCT             Encourage initiation of                                             antibiotics       Encourage continuation           of antibiotics    ----------------------------     -----------------------------        PCT >= 0.50 ng/mL                  PCT > 0.50 ng/mL               AND         increase in PCT                  Strongly encourage                                      initiation of antibiotics    Strongly encourage escalation           of antibiotics                                     -----------------------------                                           PCT <= 0.25 ng/mL                                                 OR                                        > 80% decrease in PCT                                     Discontinue / Do not initiate                                             antibiotics Performed at [facility], [address] [place], [place] [address]   Protime-INR     Status: Abnormal   Collection Time: 11/26/18  3:52 AM  Result Value Ref Range   Prothrombin Time 15.8 (H) 11.4 - 15.2 seconds   INR 1.27     Comment: Performed at [facility], [address], [place], [place] [address]  Urinalysis, Routine w reflex microscopic     Status: Abnormal   Collection Time: 11/26/18  3:52 AM  Result Value Ref Range   Color, Urine AMBER (A) YELLOW    Comment: BIOCHEMICALS MAY BE AFFECTED BY COLOR   APPearance CLOUDY (A) CLEAR   Specific Gravity, Urine 1.021 1.005 - 1.030   pH 5.0 5.0 - 8.0   Glucose, UA NEGATIVE NEGATIVE mg/dL   Hgb urine dipstick NEGATIVE NEGATIVE   Bilirubin Urine NEGATIVE NEGATIVE   Ketones, ur NEGATIVE NEGATIVE mg/dL   Protein, ur 30 (A) NEGATIVE mg/dL   Nitrite NEGATIVE NEGATIVE   Leukocytes, UA LARGE (A) NEGATIVE   RBC / HPF 0-5 0 - 5 RBC/hpf   WBC, UA >50 (H) 0 - 5  WBC/hpf   Bacteria, UA FEW (A) NONE SEEN   Squamous Epithelial / LPF 0-5 0 - 5   WBC Clumps PRESENT    Mucus PRESENT     Comment: Performed at [facility], [address] [place], [place] [address]  Type and screen [place]     Status: None   Collection Time: 11/26/18  3:55 AM  Result Value Ref Range   ABO/RH(D) AB NEG    Antibody Screen NEG    Sample Expiration      11/29/2018 Performed at [facility], [address] [place], [place] [address]   Influenza panel by PCR (type A & B)     Status: None   Collection Time: 11/26/18  3:55 AM  Result Value Ref Range   Influenza A By PCR NEGATIVE NEGATIVE   Influenza B By PCR NEGATIVE NEGATIVE    Comment: (NOTE) The Xpert Xpress Flu assay is intended as an aid in the diagnosis of  influenza and should not be used as a sole basis for treatment.  This  assay is FDA approved for nasopharyngeal swab specimens only. Nasal  washings and aspirates are unacceptable for Xpert Xpress Flu testing. Performed at [facility], [address] [place], [place] [address]   Lactic acid, plasma     Status: Abnormal   Collection Time: 11/26/18  6:03 AM  Result Value Ref Range   Lactic Acid, Venous 2.8 (HH) 0.5 - 1.9 mmol/L    Comment: CRITICAL RESULT CALLED TO, READ BACK BY AND VERIFIED WITH NOEL WEBSTER AT [phone] 11/26/2018 DAS Performed at [facility], [address] [place], [place] [address]   Blood gas, arterial     Status: Abnormal   Collection Time: 11/26/18  6:45 AM  Result Value Ref Range   FIO2 0.40    Mode ASSIST CONTROL    VT 500 mL   Peep/cpap 5.0 cm H20   pH, Arterial 7.30 (L) 7.350 - 7.450   pCO2 arterial 39 32.0 - 48.0 mmHg   pO2, Arterial 79 (L) 83.0 - 108.0 mmHg   Bicarbonate 19.2 (L) 20.0 - 28.0 mmol/L   Acid-base deficit 6.7 (H) 0.0 - 2.0 mmol/L   O2 Saturation 94.1 %   Patient temperature 37.0    Collection site RIGHT RADIAL    Sample type ARTERIAL DRAW     Allens test (pass/fail) PASS PASS   Mechanical Rate 20     Comment: Performed at [facility], [address] [place], [place] [address]   Ct Chest W Contrast  Result Date: 11/26/2018 CLINICAL DATA:  Sepsis. Dyspnea. Nausea. Diarrhea. Abdominal distention. EXAM: CT CHEST, ABDOMEN, AND PELVIS WITH CONTRAST TECHNIQUE: Multidetector CT imaging of the chest, abdomen and pelvis was performed following the standard protocol during bolus administration of intravenous contrast. CONTRAST:  132mL OMNIPAQUE IOHEXOL 300 MG/ML  SOLN COMPARISON:  [address]019 chest CT.  08/01/2015 CT abdomen/pelvis. FINDINGS: CT CHEST FINDINGS Cardiovascular: Top-normal heart size. No significant pericardial effusion/thickening. 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 Atherosclerotic nonaneurysmal thoracic aorta. Top-normal caliber main pulmonary artery (3.4 cm diameter). No central pulmonary emboli. Mediastinum/Nodes: No discrete thyroid nodules. Unremarkable esophagus. No pathologically enlarged axillary, mediastinal or hilar lymph nodes. Lungs/Pleura: No pneumothorax. No pleural effusion. There is relatively symmetric patchy parahilar consolidation and ground-glass opacity in both lungs, upper lobe predominant. No lung masses. Musculoskeletal: No aggressive appearing focal osseous lesions. Moderate thoracic spondylosis. CT ABDOMEN PELVIS FINDINGS Hepatobiliary: Diffusely irregular liver surface compatible with hepatic cirrhosis. No liver masses. Normal gallbladder with no radiopaque cholelithiasis. No biliary ductal dilatation. Pancreas: There is diffuse haziness of the peripancreatic fat extending into anterior paranephric retroperitoneal spaces bilaterally, suggestive of acute pancreatitis. No peripancreatic fluid collections. Preserved pancreatic parenchymal enhancement. No pancreatic mass or duct dilation. Spleen: Mild to moderate splenomegaly with craniocaudal splenic length 16.2 cm. No splenic mass. Adrenals/Urinary  Tract: Normal adrenals. No hydronephrosis. Simple 2.3 cm lateral lower right renal cyst. Normal bladder. Stomach/Bowel: Normal non-distended stomach. Normal caliber small bowel with no small bowel wall thickening. Normal appendix. Normal large bowel with no diverticulosis, large bowel wall thickening or pericolonic fat stranding. Vascular/Lymphatic: Atherosclerotic nonaneurysmal abdominal aorta. Patent portal, splenic, hepatic and renal veins. No pathologically enlarged lymph nodes in the abdomen or pelvis. Reproductive: Moderate-to-marked prostatomegaly with bilateral prostatic fluid collections measuring 3.3 x 3.0 cm on the right and 4.0 x 2.9 cm on the left, new since 08/01/2015 CT. Haziness of the periprostatic fat. Other: No pneumoperitoneum, ascites or focal fluid collection. Musculoskeletal: No aggressive appearing focal osseous lesions. Moderate lumbar spondylosis. IMPRESSION: 1. Relatively symmetric patchy parahilar consolidation and ground-glass opacity in both lungs, upper lobe predominant. Favor multilobar bronchopneumonia, although flash pulmonary edema could also have this appearance. 2. Acute uncomplicated pancreatitis. 3. 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 4. Hepatic cirrhosis.  Splenomegaly.  No ascites. 5. 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 6.  Aortic Atherosclerosis (ICD10-I70.0). Electronically Signed   By: Ilona Sorrel M.D.   On: 11/26/2018 05:52   Ct Abdomen Pelvis W Contrast  Result Date: 11/26/2018 CLINICAL DATA:  Sepsis. Dyspnea. Nausea. Diarrhea. Abdominal distention. EXAM: CT CHEST, ABDOMEN, AND PELVIS WITH CONTRAST TECHNIQUE: Multidetector CT imaging of the chest, abdomen and pelvis was performed following the standard protocol during bolus administration of intravenous contrast. CONTRAST:  186mL  OMNIPAQUE IOHEXOL 300 MG/ML  SOLN COMPARISON:  35019 chest CT.  08/01/2015 CT abdomen/pelvis. FINDINGS: CT CHEST FINDINGS Cardiovascular: Top-normal heart size. No significant pericardial effusion/thickening. 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 Atherosclerotic nonaneurysmal thoracic aorta. Top-normal caliber main pulmonary artery (3.4 cm diameter). No central pulmonary emboli. Mediastinum/Nodes: No discrete thyroid nodules. Unremarkable esophagus. No pathologically enlarged axillary, mediastinal or hilar lymph nodes. Lungs/Pleura: No pneumothorax. No pleural effusion. There is relatively symmetric patchy parahilar consolidation and ground-glass opacity in both lungs, upper lobe predominant. No lung masses. Musculoskeletal: No aggressive appearing focal osseous lesions. Moderate thoracic spondylosis. CT ABDOMEN PELVIS FINDINGS Hepatobiliary: Diffusely irregular liver surface compatible with hepatic cirrhosis. No liver masses. Normal gallbladder with no radiopaque cholelithiasis. No biliary ductal dilatation. Pancreas: There is diffuse haziness of the peripancreatic fat extending into anterior paranephric retroperitoneal spaces bilaterally, suggestive of acute pancreatitis. No peripancreatic fluid collections. Preserved pancreatic parenchymal  enhancement. No pancreatic mass or duct dilation. Spleen: Mild to moderate splenomegaly with craniocaudal splenic length 16.2 cm. No splenic mass. Adrenals/Urinary Tract: Normal adrenals. No hydronephrosis. Simple 2.3 cm lateral lower right renal cyst. Normal bladder. Stomach/Bowel: Normal non-distended stomach. Normal caliber small bowel with no small bowel wall thickening. Normal appendix. Normal large bowel with no diverticulosis, large bowel wall thickening or pericolonic fat stranding. Vascular/Lymphatic: Atherosclerotic nonaneurysmal abdominal aorta. Patent portal, splenic, hepatic and renal veins. No pathologically enlarged lymph nodes in the abdomen or  pelvis. Reproductive: Moderate-to-marked prostatomegaly with bilateral prostatic fluid collections measuring 3.3 x 3.0 cm on the right and 4.0 x 2.9 cm on the left, new since 08/01/2015 CT. Haziness of the periprostatic fat. Other: No pneumoperitoneum, ascites or focal fluid collection. Musculoskeletal: No aggressive appearing focal osseous lesions. Moderate lumbar spondylosis. IMPRESSION: 1. Relatively symmetric patchy parahilar consolidation and ground-glass opacity in both lungs, upper lobe predominant. Favor multilobar bronchopneumonia, although flash pulmonary edema could also have this appearance. 2. Acute uncomplicated pancreatitis. 3. 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 4. Hepatic cirrhosis.  Splenomegaly.  No ascites. 5. 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 6.  Aortic Atherosclerosis (ICD10-I70.0). Electronically Signed   By: Ilona Sorrel M.D.   On: 11/26/2018 05:52   Dg Chest Portable 1 View  Result Date: 11/26/2018 CLINICAL DATA:  Intubated EXAM: PORTABLE CHEST 1 VIEW COMPARISON:  Chest radiograph from earlier today. FINDINGS: Endotracheal tube tip is 4.2 cm above the carina. Enteric tube enters stomach with the tip not seen on this image. Stable cardiomediastinal silhouette with borderline mild cardiomegaly. No pneumothorax. No pleural effusion. Rapidly worsening fluffy parahilar opacities in both lungs. IMPRESSION: 1. Well-positioned endotracheal and enteric tubes. 2. Rapidly worsening fluffy parahilar opacities in both lungs and borderline mild cardiomegaly, compatible with congestive heart failure with flash pulmonary edema. Electronically Signed   By: Ilona Sorrel M.D.   On: 11/26/2018 06:33   Dg Chest Port 1 View  Result Date: 11/26/2018 CLINICAL DATA:  Sepsis and hypoxia EXAM: PORTABLE CHEST 1 VIEW  COMPARISON:  06/23/2016 chest radiograph. FINDINGS: Stable cardiomediastinal silhouette with normal heart size. No pneumothorax. No pleural effusion. Mild patchy upper left lung opacity. IMPRESSION: Mild patchy upper left lung opacity, which could represent asymmetric mild pulmonary edema versus developing pneumonia. Chest radiograph follow-up suggested. Electronically Signed   By: Ilona Sorrel M.D.   On: 11/26/2018 04:14    Pending Labs Unresulted Labs (From admission, onward)    Start     Ordered   11/26/18 0532  Cortisol  Add-on,   AD     11/26/18 0532   11/26/18 4081  Urine culture  Add-on,   AD     11/26/18 4481   11/26/18 0324  Gastrointestinal Panel by PCR , Stool  (Gastrointestinal Panel by PCR, Stool)  Once,   STAT     11/26/18 0324   11/26/18 0324  C difficile quick scan w PCR reflex  (C Difficile quick screen w PCR reflex panel)  Once, for 24 hours,   STAT     11/26/18 0324   Signed and Held  CBC  (heparin)  Once,   R    Comments:  Baseline for heparin therapy IF NOT ALREADY DRAWN.  Notify MD if PLT < 100 K.    Signed and Held   Signed and Held  Creatinine, serum  (heparin)  Once,   R    Comments:  Baseline for heparin therapy IF NOT ALREADY DRAWN.    Signed and Held   Signed and Held  Comprehensive metabolic panel  Tomorrow morning,   R     Signed and Held   Signed and Held  CBC  Tomorrow morning,   R     Signed and Held   Signed and Held  Protime-INR  Tomorrow morning,   R     Signed and Held          Vitals/Pain Today's Vitals   11/26/18 0855 11/26/18 0900 11/26/18 0905 11/26/18 0910  BP: 111/63 (!) 101/59 97/60 99/61   Pulse: 89 87 86 87  Resp: (!) 27 (!) 28 (!) 26 (!) 27  Temp: (!) 100.6 F (38.1 C) (!) 100.5 F (38.1 C) (!) 100.5 F (38.1 C) (!) 100.5 F (38.1 C)  TempSrc:      SpO2: 96% 96% 95% 96%  Weight:      Height:      PainSc:        Isolation Precautions Droplet precaution  Medications Medications  metroNIDAZOLE (FLAGYL) IVPB 500 mg (0  mg Intravenous Stopped 11/26/18 0554)  norepinephrine (LEVOPHED) 4mg  in NS 284mL premix infusion (10 mcg/min Intravenous Rate/Dose Change 11/26/18 0853)  dexmedetomidine (PRECEDEX) 400 MCG/100ML (4 mcg/mL) infusion (1 mcg/kg/hr  104.3 kg Intravenous Rate/Dose Verify 11/26/18 0646)  propofol (DIPRIVAN) 1000 MG/100ML infusion (15 mcg/kg/min  104.3 kg Intravenous Rate/Dose Change 11/26/18 0802)  fentaNYL 2568mcg in NS 224mL (23mcg/ml) infusion-PREMIX (150 mcg/hr Intravenous New Bag/Given 11/26/18 0853)  vasopressin (PITRESSIN) 40 Units in sodium chloride 0.9 % 250 mL (0.16 Units/mL) infusion (0.03 Units/min Intravenous New Bag/Given 11/26/18 0743)  milrinone (PRIMACOR) 20 MG/100 ML (0.2 mg/mL) infusion (0.375 mcg/kg/min  104.3 kg Intravenous New Bag/Given 11/26/18 0745)  fentaNYL (SUBLIMAZE) injection 200 mcg (200 mcg Intravenous Not Given 11/26/18 0911)  propofol (DIPRIVAN) 10 mg/mL bolus/IV push 50 mg (50 mg Intravenous Not Given 11/26/18 0911)  iohexol (OMNIPAQUE) 300 MG/ML solution 100 mL (100 mLs Intravenous Contrast Given 11/26/18 0507)  sodium chloride 0.9 % bolus 2,000 mL (0 mLs Intravenous Stopped 11/26/18 0452)  ibuprofen (ADVIL,MOTRIN) tablet 600 mg (600 mg Oral Given 11/26/18 0414)  ceFEPIme (MAXIPIME) 2 g in sodium chloride 0.9 % 100 mL IVPB (0 g Intravenous Stopped 11/26/18 0451)  vancomycin (VANCOCIN) 2,000 mg in sodium chloride 0.9 % 500 mL IVPB (0 mg Intravenous Stopped 11/26/18 0617)  fentaNYL (SUBLIMAZE) injection 75 mcg (75 mcg Intravenous Given 11/26/18 0416)  sodium chloride 0.9 % bolus 2,000 mL (0 mLs Intravenous Stopped 11/26/18 0609)  sodium chloride 0.9 % bolus 1,000 mL (0 mLs Intravenous Stopped 11/26/18 0631)  ketamine (KETALAR) injection 200 mg (200 mg Intravenous Given 11/26/18 0536)  rocuronium (ZEMURON) injection 80 mg (80 mg Intravenous Given 11/26/18 0537)  fentaNYL (SUBLIMAZE) injection 250 mcg (250 mcg Intravenous Given 11/26/18 0536)  hydrocortisone sodium succinate (SOLU-CORTEF)  100 MG injection 200 mg (200 mg Intravenous Given 11/26/18 0623)    Mobility  non-ambulatory

## 2018-11-26 NOTE — ED Triage Notes (Signed)
Patient presents to Emergency Department via EMS with complaints of N/D/SOB/hyperventilating while lying, and distended belly with hernia.  Pt was given tylenol by wife at 80  Wife coming to ED

## 2018-11-26 NOTE — Op Note (Signed)
Procedure: Cystoscopy with transurethral incision and drainage of prostatic abscess.  Preop diagnosis: Prostatic abscess with sepsis.  Postoperative diagnosis: Same.  Surgeon: Dr. Irine Seal.  Anesthesia: General.  Specimen: Prostate chips.  Drains: 43 French Foley catheter.  EBL: Minimal.  Complications: None.  Indications: [name] is a 78 year old male with a history of recurrent urinary tract infections who came to the emergency room this morning with profound sepsis and was found to have prostatic abscesses on CT scan.  He was started on Keflex and Bactrim recently for E. coli recurrent urinary tract infection.  Procedure: He had been given broad-spectrum antibiotics in the ER.  He was brought to the OR intubated on pressors.  He was placed in the lithotomy position.  He was prepped with Hibiclens and draped in the usual sterile fashion.  Cystoscopy was performed using the 23 [place] scope and 30 degree lens.  Examination revealed a normal urethra.  The external sphincter was intact.  The prostatic urethra was approximately 4 to 5 cm in length with bilobar hyperplasia with some degree of obstruction and a small middle lobe.  Examination of the bladder revealed moderate trabeculation with marked inflammatory changes.  Ureteral orifice ease were unremarkable.  The cystoscope was removed and a 26 French continuous-flow resectoscope sheath was inserted.  This was fitted with an Beatrix Fetters handle with a bipolar loop and the 30 degree lens.  The CT scan had demonstrated a left proximal lateral abscess and a right apical posterior abscess.  The resection was begun over the area of the left lateral abscess just distal to the bladder neck.  The abscess was encountered almost immediately and drained copious amounts of purulent material.  The abscess cavity was widely open and hemostasis was achieved.  I then inspected the right apical region and purulent material was noted to be effluxing adjacent  to the verumontanum.  Resection was carried down in that area and the second abscess cavity was quickly encountered.  Significant purulent material was released.  Hemostasis was achieved and the limited number of chips that had been resected were removed.  Final inspection demonstrated no retained chips and no active bleeding.  A 22 French Foley catheter was inserted after the scope was removed.  The balloon was filled with 10 mL of sterile fluid and the catheter drained readily.  The catheter was placed to straight drainage.  The patient was moved to the ICU bed and will be taken to the intensive care unit in critical condition.  There were no complications.

## 2018-11-26 NOTE — ED Notes (Signed)
Pharmacy called for med

## 2018-11-26 NOTE — ED Notes (Signed)
CRITICAL LAB: LACTIC is 2.8, [facility], Dr. Mable Paris notified, orders received

## 2018-11-26 NOTE — ED Notes (Signed)
Lorrie, RN called to transport pt to the floor

## 2018-11-26 NOTE — Progress Notes (Signed)
Pt in and out of junctional/SB.  HR 49-50bpm - unchanged from 2000.  Notified Patria Mane, NP.

## 2018-11-26 NOTE — Progress Notes (Signed)
PHARMACY - PHYSICIAN COMMUNICATION CRITICAL VALUE ALERT - BLOOD CULTURE IDENTIFICATION (BCID)  Results for orders placed or performed during the hospital encounter of 11/26/18  Blood Culture ID Panel (Reflexed) (Collected: 11/26/2018  3:45 AM)  Result Value Ref Range   Enterococcus species NOT DETECTED NOT DETECTED   Listeria monocytogenes NOT DETECTED NOT DETECTED   Staphylococcus species NOT DETECTED NOT DETECTED   Staphylococcus aureus (BCID) NOT DETECTED NOT DETECTED   Streptococcus species NOT DETECTED NOT DETECTED   Streptococcus agalactiae NOT DETECTED NOT DETECTED   Streptococcus pneumoniae NOT DETECTED NOT DETECTED   Streptococcus pyogenes NOT DETECTED NOT DETECTED   Acinetobacter baumannii NOT DETECTED NOT DETECTED   Enterobacteriaceae species DETECTED (A) NOT DETECTED   Enterobacter cloacae complex NOT DETECTED NOT DETECTED   Escherichia coli DETECTED (A) NOT DETECTED   Klebsiella oxytoca NOT DETECTED NOT DETECTED   Klebsiella pneumoniae NOT DETECTED NOT DETECTED   Proteus species NOT DETECTED NOT DETECTED   Serratia marcescens NOT DETECTED NOT DETECTED   Carbapenem resistance NOT DETECTED NOT DETECTED   Haemophilus influenzae NOT DETECTED NOT DETECTED   Neisseria meningitidis NOT DETECTED NOT DETECTED   Pseudomonas aeruginosa NOT DETECTED NOT DETECTED   Candida albicans NOT DETECTED NOT DETECTED   Candida glabrata NOT DETECTED NOT DETECTED   Candida krusei NOT DETECTED NOT DETECTED   Candida parapsilosis NOT DETECTED NOT DETECTED   Candida tropicalis NOT DETECTED NOT DETECTED    Name of physician (or Provider) Contacted: Dr Bretta Bang  Changes to prescribed antibiotics required: The patient is currently on vancomycin, cefepime and metronidazole for sepsis of unknown origin. The BCID algorithm suggests changing cefepime to ceftriaxone. However, there is also probable underlying pneumonia requiring antipseudomonal coverage with cefepime, which also covers KPC(-) E coli, at  this point in the patient's therapy. No changes are recommended to therapy at this point.  Dallie Piles, PharmD 11/26/2018  3:17 PM

## 2018-11-26 NOTE — ED Notes (Signed)
Dr. Mable Paris at bedside, pt becoming more responsive, opening eyes and trying to breath over tube. Dr. Mable Paris administered 200 mcg bolus of fentanyl.

## 2018-11-26 NOTE — Consult Note (Addendum)
[facility] Consultation      Name: Mason Wilson. MRN: [id number] DOB: 27-Feb-1941    ADMISSION DATE:  11/26/2018 CONSULTATION DATE:  11/26/18  REFERRING MD : Saundra Shelling  CHIEF COMPLAINT:  As per ER/hospitalist note SOB/nausea/diarrhea/rectal bleeding   HISTORY OF PRESENT ILLNESS    78 y.o. male with a known history of cirrhosis(non alcoholic)hyperlipidemia, hypertension, melanoma, hypogonadism, sleep apnea presented to the emergency room for fever, shaking chills and abdominal discomfort.  Patient also had some nausea and vomiting.  He was  confused when he presented to the emergency room.  He has History of urinary tract infections in the past and recently completed a course of antibiotics that is Bactrim and Keflex.  Patient was evaluated in the emergency room he was found to be septic.  He was given 4 L IV fluids.  Patient was worked up with CT abdomen which showed abscess in the prostate.  Urology consultation was done.  CT scanning of the chest and abdomen also showed  ?pneumonia.  Patient started on broad-spectrum IV antibiotics.  He developed resp distress in the ER and was intubated. He was started on IV milrinone drip.  He was also seen by urology and taken to OR for prostate abscess draiange before transfer to the ICU.  SIGNIFICANT EVENTS   Intubated/sedated To OR for prostate abscess drainage s/p Cystoscopy with transurethral incision and drainage of prostatic abscess.     PAST MEDICAL HISTORY    :  Past Medical History:  Diagnosis Date  . Allergy   . Arthritis    "everywhere' - worse in back  . Body mass index 39.0-39.9, adult   . CAD (coronary artery disease)   . Chronic back pain   . Cirrhosis, non-alcoholic ([place])   . Diabetes mellitus without complication ([place])   . Elevated liver enzymes   . GERD (gastroesophageal reflux disease)   . Heart murmur   . Hyperlipidemia   . Hypertension   . Hypogonadism in male   . Melanoma  ([place])    melanoma   . Melanoma in situ of right shoulder ([place])   . Myasthenia gravis ([place]) 01/2018  . Renal stones   . Sleep apnea    CPAP  . Uses hearing aid    doesn't wear   Past Surgical History:  Procedure Laterality Date  . ANGIOPLASTY  1999   3 stents  . CARDIAC CATHETERIZATION     stents placed  . CARPAL TUNNEL RELEASE     x2  . CATARACT EXTRACTION, BILATERAL    . COLONOSCOPY WITH PROPOFOL N/A 07/03/2018   Procedure: COLONOSCOPY WITH PROPOFOL;  Surgeon: Lucilla Lame, MD;  Location: [facility];  Service: Endoscopy;  Laterality: N/A;  . ESOPHAGOGASTRODUODENOSCOPY (EGD) WITH PROPOFOL N/A 09/08/2015   Procedure: ESOPHAGOGASTRODUODENOSCOPY (EGD) WITH PROPOFOL;  Surgeon: Lucilla Lame, MD;  Location: [facility];  Service: Endoscopy;  Laterality: N/A;  CPAP Diabetic - oral meds  . ESOPHAGOGASTRODUODENOSCOPY (EGD) WITH PROPOFOL N/A 07/03/2018   Procedure: ESOPHAGOGASTRODUODENOSCOPY (EGD) WITH PROPOFOL;  Surgeon: Lucilla Lame, MD;  Location: [facility];  Service: Endoscopy;  Laterality: N/A;  diabetic - oral meds sleep apnea  . LIVER BIOPSY    . POLYPECTOMY N/A 07/03/2018   Procedure: POLYPECTOMY;  Surgeon: Lucilla Lame, MD;  Location: [facility];  Service: Endoscopy;  Laterality: N/A;  . SKIN LESION EXCISION  1998  . SKIN LESION EXCISION     skin cancer removed on forehead in June 2018  . TONSILLECTOMY  age 85   Prior to Admission medications   Medication Sig Start Date End Date Taking? Authorizing Provider  Calcium Carbonate-Vit D-Min (CALCIUM 1200 PO) Take 1,200 mg by mouth.   Yes [provider]  glipiZIDE (GLUCOTROL) 5 MG tablet Take 1 tablet (5 mg total) by mouth daily before breakfast. 09/05/18  Yes Crissman, Jeannette How, MD  hydrochlorothiazide (HYDRODIURIL) 25 MG tablet Take 1 tablet (25 mg total) by mouth daily. 09/05/18  Yes Crissman, Jeannette How, MD  losartan (COZAAR) 100 MG tablet Take 1 tablet (100 mg total) by mouth daily.  09/05/18  Yes [name], MD  metFORMIN (GLUCOPHAGE) 500 MG tablet Take 2 tablets (1,000 mg total) by mouth 2 (two) times daily with a meal. 09/05/18  Yes Crissman, Jeannette How, MD  metoprolol succinate (TOPROL-XL) 50 MG 24 hr tablet Take 1 tablet (50 mg total) by mouth daily. Take with or immediately following a meal. 09/05/18  Yes Crissman, Jeannette How, MD  omeprazole (PRILOSEC) 40 MG capsule Take 40 mg by mouth daily.  11/23/18  Yes [provider]  pyridostigmine (MESTINON) 60 MG tablet Take 60 mg by mouth 4 (four) times daily.   Yes [provider]  simvastatin (ZOCOR) 40 MG tablet Take 1 tablet (40 mg total) by mouth daily at 6 PM. 09/05/18  Yes Crissman, Jeannette How, MD  tamsulosin (FLOMAX) 0.4 MG CAPS capsule TAKE 1 CAPSULE(0.4 MG) BY MOUTH DAILY 08/09/18  Yes McGowan, Larene Beach A, PA-C  cephALEXin (KEFLEX) 500 MG capsule Take 1 capsule (500 mg total) by mouth 2 (two) times daily. Patient not taking: Reported on 11/26/2018 11/15/18   Zara Council A, PA-C  predniSONE (DELTASONE) 20 MG tablet  09/10/18   [provider]  sulfamethoxazole-trimethoprim (BACTRIM DS,SEPTRA DS) 800-160 MG tablet Take 1 tablet by mouth every 12 (twelve) hours. Patient not taking: Reported on 11/26/2018 11/10/18   Zara Council A, PA-C   Allergies  Allergen Reactions  . Altace [Ramipril] Cough     FAMILY HISTORY   Family History  Problem Relation Age of Onset  . Thyroid disease Mother   . Alzheimer's disease Mother   . Heart disease Mother   . Cancer Father 55       testicular  . Heart disease Father   . Hypertension Father   . Heart attack Father   . Kidney disease Neg Hx   . Prostate cancer Neg Hx   . Kidney cancer Neg Hx   . Bladder Cancer Neg Hx       SOCIAL HISTORY    reports that he quit smoking about 48 years ago. His smoking use included cigarettes. His smokeless tobacco use includes chew. He reports that he does not drink alcohol or use drugs.  ROS  Pt  intubated/sedated. Unable to obtain ROS    VITAL SIGNS    Temp:  [97.6 F (36.4 C)-102.6 F (39.2 C)] 100.3 F (37.9 C) (01/19 0955) Pulse Rate:  [77-109] 80 (01/19 0955) Resp:  [20-43] 25 (01/19 0955) BP: (72-129)/(46-103) 101/62 (01/19 0955) SpO2:  [90 %-100 %] 96 % (01/19 0955) FiO2 (%):  [40 %] 40 % (01/19 0600) Weight:  [104.3 kg] 104.3 kg (01/19 0333) HEMODYNAMICS:   VENTILATOR SETTINGS: Vent Mode: AC FiO2 (%):  [40 %] 40 % Set Rate:  [20 bmp] 20 bmp Vt Set:  [500 mL] 500 mL PEEP:  [5 cmH20] 5 cmH20 INTAKE / OUTPUT:  Intake/Output Summary (Last 24 hours) at 11/26/2018 1118 Last data filed at 11/26/2018 6160 Gross  per 24 hour  Intake 5405.57 ml  Output -  Net 5405.57 ml       PHYSICAL EXAM   Physical Exam  GENERAL:  78 y.o.-year-old patient lying in the bed on mechanical ventilator EYES: Pupils equal, round, reactive to light and accommodation. No scleral icterus.  HEENT: Head atraumatic, normocephalic. ET tube noted NECK:  Supple, no jugular venous distention. No thyroid enlargement, no tenderness.  LUNGS: Creased breath sounds bilaterally, Rales heard in both lungs.  On mechanical ventilator CARDIOVASCULAR: S1, S2 normal. No murmurs, rubs, or gallops.  ABDOMEN: Soft, nontender, nondistended. Bowel sounds present. No organomegaly or mass.  EXTREMITIES: No pedal edema, cyanosis, or clubbing.  NEUROLOGIC: Patient is is on IV propofol drip for sedation Unable to obtain complete nervous system exam PSYCHIATRIC: The patient is alert and oriented x none SKIN: No obvious rash, lesion, or ulcer.    LABS   LABS:  CBC Recent Labs  Lab 11/26/18 0343  WBC 0.8*  HGB 9.7*  HCT 29.8*  PLT 64*   Coag's Recent Labs  Lab 11/26/18 0352  INR 1.27   BMET Recent Labs  Lab 11/26/18 0343  NA 141  K 3.3*  CL 111  CO2 18*  BUN 63*  CREATININE 1.60*  GLUCOSE 126*   Electrolytes Recent Labs  Lab 11/26/18 0343  CALCIUM 8.9   Sepsis  Markers Recent Labs  Lab 11/26/18 0345 11/26/18 0352 11/26/18 0603  LATICACIDVEN 5.8*  --  2.8*  PROCALCITON  --  20.47  --    ABG Recent Labs  Lab 11/26/18 0645  PHART 7.30*  PCO2ART 39  PO2ART 79*   Liver Enzymes Recent Labs  Lab 11/26/18 0343  AST 43*  ALT 100*  ALKPHOS 113  BILITOT 3.4*  ALBUMIN 3.1*   Cardiac Enzymes Recent Labs  Lab 11/26/18 0343  TROPONINI 0.06*   Glucose No results for input(s): GLUCAP in the last 168 hours.   Recent Results (from the past 240 hour(s))  Culture, blood (routine x 2)     Status: None (Preliminary result)   Collection Time: 11/26/18  3:45 AM  Result Value Ref Range Status   Specimen Description BLOOD LEFT HAND  Final   Special Requests   Final    BOTTLES DRAWN AEROBIC AND ANAEROBIC Blood Culture results may not be optimal due to an excessive volume of blood received in culture bottles   Culture   Final    NO GROWTH < 12 HOURS Performed at [facility], [address] [place], [place] [address]    Report Status PENDING  Incomplete  Culture, blood (routine x 2)     Status: None (Preliminary result)   Collection Time: 11/26/18  3:45 AM  Result Value Ref Range Status   Specimen Description BLOOD LEFT ARM  Final   Special Requests   Final    BOTTLES DRAWN AEROBIC AND ANAEROBIC Blood Culture results may not be optimal due to an excessive volume of blood received in culture bottles   Culture   Final    NO GROWTH < 12 HOURS Performed at [facility], [address], [place], [place] [address]    Report Status PENDING  Incomplete     Current Facility-Administered Medications:  .  [MAR Hold] ceFEPIme (MAXIPIME) 2 g in sodium chloride 0.9 % 100 mL IVPB, 2 g, Intravenous, Q12H, Dallie Piles, RPH .  ciprofloxacin (CIPRO) IVPB 400 mg, 400 mg, Intravenous, Q12H, Rosine Door, MD .  Doug Sou Hold] dexmedetomidine [facility]) 400  MCG/100ML (4 mcg/mL) infusion, 0.4-1.2 mcg/kg/hr, Intravenous, Titrated,  Rifenbark, Milta Deiters, MD, Last Rate: 26.1 mL/hr at 11/26/18 0950, 1 mcg/kg/hr at 11/26/18 0950 .  [MAR Hold] fentaNYL (SUBLIMAZE) injection 200 mcg, 200 mcg, Intravenous, Once, Rifenbark, Neil, MD .  fentaNYL 2535mcg in NS 284mL (61mcg/ml) infusion-PREMIX, 150 mcg/hr, Intravenous, Continuous, Rifenbark, Neil, MD, Last Rate: 15 mL/hr at 11/26/18 0950, 150 mcg/hr at 11/26/18 0950 .  milrinone (PRIMACOR) 20 MG/100 ML (0.2 mg/mL) infusion, 0.375 mcg/kg/min, Intravenous, Continuous, Rifenbark, Neil, MD, Last Rate: 11.73 mL/hr at 11/26/18 0950, 0.375 mcg/kg/min at 11/26/18 0950 .  norepinephrine (LEVOPHED) 4mg  in NS 261mL premix infusion, 0-40 mcg/min, Intravenous, Continuous, Rifenbark, Neil, MD, Last Rate: 22.5 mL/hr at 11/26/18 1100, 6 mcg/min at 11/26/18 1100 .  [MAR Hold] propofol (DIPRIVAN) 10 mg/mL bolus/IV push 50 mg, 50 mg, Intravenous, Once, Rifenbark, Neil, MD .  propofol (DIPRIVAN) 1000 MG/100ML infusion, 5-80 mcg/kg/min, Intravenous, Continuous, Rifenbark, Neil, MD, Last Rate: 12.52 mL/hr at 11/26/18 0953, 20 mcg/kg/min at 11/26/18 0953 .  [MAR Hold] vancomycin (VANCOCIN) IVPB 1000 mg/200 mL premix, 1,000 mg, Intravenous, Q24H, Dallie Piles, RPH .  vasopressin (PITRESSIN) 40 Units in sodium chloride 0.9 % 250 mL (0.16 Units/mL) infusion, 0.03 Units/min, Intravenous, Continuous, Rifenbark, Neil, MD, Last Rate: 11.25 mL/hr at 11/26/18 0959, 0.03 Units/min at 11/26/18 0959  Facility-Administered Medications Ordered in Other Encounters:  .  0.9 %  sodium chloride infusion, , , Continuous PRN, Aline Brochure, CRNA .  midazolam (VERSED) injection, , , Anesthesia Intra-op, Aline Brochure, CRNA, 5 mg at 11/26/18 1036 .  phenylephrine (NEO-SYNEPHRINE) injection, , Intravenous, Anesthesia Intra-op, Aline Brochure, CRNA, 100 mcg at 11/26/18 1114 .  rocuronium (ZEMURON) injection, , , Anesthesia Intra-op, Aline Brochure, CRNA, 50 mg at 11/26/18 1036  IMAGING   CT chest/abd/pelvis as per rad.  Images reviewed by me 1. Relatively symmetric patchy parahilar consolidation and ground-glass opacity in both lungs, upper lobe predominant. Favor multilobar bronchopneumonia, although flash pulmonary edema could also have this appearance. 2. Acute uncomplicated pancreatitis. 3. 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 4. Hepatic cirrhosis.  Splenomegaly.  No ascites. 5. 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 6.  Aortic Atherosclerosis (ICD10-I70.0).      Indwelling Urinary Catheter continued, requirement due to   Reason to continue Indwelling Urinary Catheter for strict Intake/Output monitoring for hemodynamic instability   Central Line continued, requirement due to   Reason to continue [employer] Monitoring of central venous pressure or other hemodynamic parameters   Ventilator continued, requirement due to, resp failure    Ventilator Sedation RASS 0 to -2   MAJOR EVENTS/TEST RESULTS:   INDWELLING DEVICES::  MICRO DATA: MRSA PCR  Urine  Blood Resp   ANTIMICROBIALS:     ASSESSMENT/PLAN    Acute resp failure related to ARDS vs pneumonia Cont full vent support SBT when ready Wean o2/rate as tolerated  2. Septic shock related to prostatitis with prostate  Abscess. S/p Cystoscopy with transurethral incision and drainage of prostatic abscess.  rx with vanco /cefepime/flagy in er. Will stop flagyl and add ciprofloxacin( full dose, GFR is around 50) Cont IVF  IVS added as he has been on out pt steroids Follow blood, prostate  and urine c/s   CARDIOVASCULAR Mildly elevated troponin, will follow monitor tele and bp closely Hold all out pt BP meds  RENAL AKI related to sepsis. IVf as above.  Monitor u/o , he has foley catheter Replace  electrolytes  GASTROINTESTINAL NPO  NON Alcoholic Cirrhosis with porta HTN/splenomegaly Abnormal LFT/thrombocytopenia. Follow LFT closely. Check ammonia No sig ascites  H/O myasthenia Gravis, cont pyrodostigmine IV steroids started.  DM hold oral hypoglycemics Start SSI  ? Rectal bleeding will follow H&H. Transfuse if needed  ? Acute pancreatitis by CT check lipase   NEUROLOGIC Sedated. RASS score 0- 2  Full code. SCD for DVT prophylaxis. IV PPI  I have personally obtained a history( from the chart as pt is sedated on vent), examined the patient, evaluated laboratory and independently reviewed  imaging results, formulated the assessment and plan and placed orders.  The Patient requires high complexity decision making for assessment and support, frequent evaluation and titration of therapies, application of advanced monitoring technologies and extensive interpretation of multiple databases. Critical Care Time devoted to patient care services described in this note is 55 minutes.   Overall, patient is critically ill, prognosis is guarded. Patient at high risk for cardiac arrest and death.    Rosine Door, MD  2018/12/16 11:18 AM [facility]   Blood c/s shows growth of E coli. Will stop vancomycin. Will cont cefepime and cipro for now and adjust once sensitivities are available. If sensitive Cipro would be better choice due to better penetration of cipro in prostate.he will need prolong therapy for 4 to 6 weeks.

## 2018-11-26 NOTE — ED Notes (Signed)
Family at bedside with chaplain  

## 2018-11-27 ENCOUNTER — Encounter: Payer: Self-pay | Admitting: Urology

## 2018-11-27 DIAGNOSIS — Z9911 Dependence on respirator [ventilator] status: Secondary | ICD-10-CM

## 2018-11-27 DIAGNOSIS — J9600 Acute respiratory failure, unspecified whether with hypoxia or hypercapnia: Secondary | ICD-10-CM

## 2018-11-27 DIAGNOSIS — N412 Abscess of prostate: Secondary | ICD-10-CM

## 2018-11-27 DIAGNOSIS — R6521 Severe sepsis with septic shock: Secondary | ICD-10-CM

## 2018-11-27 DIAGNOSIS — N410 Acute prostatitis: Secondary | ICD-10-CM

## 2018-11-27 LAB — COMPREHENSIVE METABOLIC PANEL
ALT: 65 U/L — ABNORMAL HIGH (ref 0–44)
AST: 27 U/L (ref 15–41)
Albumin: 2.4 g/dL — ABNORMAL LOW (ref 3.5–5.0)
Alkaline Phosphatase: 66 U/L (ref 38–126)
Anion gap: 6 (ref 5–15)
BUN: 58 mg/dL — AB (ref 8–23)
CO2: 25 mmol/L (ref 22–32)
Calcium: 7.6 mg/dL — ABNORMAL LOW (ref 8.9–10.3)
Chloride: 112 mmol/L — ABNORMAL HIGH (ref 98–111)
Creatinine, Ser: 1.11 mg/dL (ref 0.61–1.24)
GFR calc Af Amer: >60 mL/min (ref >60)
GFR calc non Af Amer: >60 mL/min (ref >60)
Glucose, Bld: 290 mg/dL — ABNORMAL HIGH (ref 70–99)
POTASSIUM: 4 mmol/L (ref 3.5–5.1)
Sodium: 143 mmol/L (ref 135–145)
Total Bilirubin: 2.5 mg/dL — ABNORMAL HIGH (ref 0.3–1.2)
Total Protein: 5.2 g/dL — ABNORMAL LOW (ref 6.5–8.1)

## 2018-11-27 LAB — CBC
HCT: 24 % — ABNORMAL LOW (ref 39.0–52.0)
HEMOGLOBIN: 7.7 g/dL — AB (ref 13.0–17.0)
MCH: 29.8 pg (ref 26.0–34.0)
MCHC: 32.1 g/dL (ref 30.0–36.0)
MCV: 93 fL (ref 80.0–100.0)
Platelets: 43 10*3/uL — ABNORMAL LOW (ref 150–400)
RBC: 2.58 MIL/uL — ABNORMAL LOW (ref 4.22–5.81)
RDW: 15.5 % (ref 11.5–15.5)
WBC: 2.7 10*3/uL — ABNORMAL LOW (ref 4.0–10.5)
nRBC: 0 % (ref 0.0–0.2)

## 2018-11-27 LAB — GLUCOSE, CAPILLARY
Glucose-Capillary: 285 mg/dL — ABNORMAL HIGH (ref 70–99)
Glucose-Capillary: 248 mg/dL — ABNORMAL HIGH (ref 70–99)
Glucose-Capillary: 258 mg/dL — ABNORMAL HIGH (ref 70–99)
Glucose-Capillary: 248 mg/dL — ABNORMAL HIGH (ref 70–99)
Glucose-Capillary: 230 mg/dL — ABNORMAL HIGH (ref 70–99)
Glucose-Capillary: 215 mg/dL — ABNORMAL HIGH (ref 70–99)

## 2018-11-27 LAB — LIPASE, BLOOD: Lipase: 38 U/L (ref 11–51)

## 2018-11-27 LAB — AMYLASE: Amylase: 35 U/L (ref 28–100)

## 2018-11-27 LAB — TROPONIN I: Troponin I: 0.29 ng/mL (ref <0.03)

## 2018-11-27 LAB — PROTIME-INR
INR: 1.33
Prothrombin Time: 16.3 seconds — ABNORMAL HIGH (ref 11.4–15.2)

## 2018-11-27 LAB — PREALBUMIN: Prealbumin: 10.2 mg/dL — ABNORMAL LOW (ref 18–38)

## 2018-11-27 MED ORDER — FENTANYL CITRATE (PF) 100 MCG/2ML IJ SOLN
25.0000 ug | INTRAMUSCULAR | Status: DC | PRN
  Administered 2018-11-27 – 2018-11-28 (×2): 25 ug via INTRAVENOUS

## 2018-11-27 MED ORDER — INSULIN GLARGINE 100 UNIT/ML ~~LOC~~ SOLN
20.0000 [IU] | Freq: Every day | SUBCUTANEOUS | Status: DC
  Administered 2018-11-27: 20 [IU] via SUBCUTANEOUS
  Filled 2018-11-27 (×2): qty 0.2

## 2018-11-27 MED ORDER — PYRIDOSTIGMINE BROMIDE 60 MG PO TABS
60.0000 mg | ORAL_TABLET | Freq: Four times a day (QID) | ORAL | Status: DC
  Administered 2018-11-27 – 2018-11-28 (×4): 60 mg
  Filled 2018-11-27 (×6): qty 1

## 2018-11-27 MED ORDER — ACETAMINOPHEN 650 MG RE SUPP: 650.0000 mg | Freq: Four times a day (QID) | RECTAL | Status: DC | PRN

## 2018-11-27 MED ORDER — HYDROCORTISONE NA SUCCINATE PF 100 MG IJ SOLR
50.0000 mg | Freq: Three times a day (TID) | INTRAMUSCULAR | Status: DC
  Administered 2018-11-27 – 2018-11-28 (×4): 50 mg via INTRAVENOUS
  Filled 2018-11-27 (×4): qty 2

## 2018-11-27 MED ORDER — SODIUM CHLORIDE 0.9 % IV SOLN
1.0000 g | Freq: Three times a day (TID) | INTRAVENOUS | Status: DC
  Administered 2018-11-27 – 2018-11-28 (×3): 1 g via INTRAVENOUS
  Filled 2018-11-27 (×5): qty 1

## 2018-11-27 MED ORDER — ACETAMINOPHEN 325 MG PO TABS
650.0000 mg | ORAL_TABLET | Freq: Four times a day (QID) | ORAL | Status: DC | PRN
  Administered 2018-11-29 – 2018-11-30 (×2): 650 mg
  Filled 2018-11-27 (×2): qty 2

## 2018-11-27 NOTE — Progress Notes (Signed)
CRITICAL CARE NOTE  CC  Follow up respiratory failure on mechanical ventilator   SUBJECTIVE Patient remains intubated Weaned off propofol in AM, continue fentanyl  Patient is awake and alert in PM, respond to question Update son and son's girlfriend at bedside   [place] 1/19 - Septic shock in ED, Intubated on mechanical ventilation  1/19 - Underwent TURP for prostate abscess 1/19 - Transferred to ICU 1/20 - Weaned off propofol in AM, remains intubated. Currently awake and alert   BP (!) 93/51   Pulse 61   Temp 99.3 F (37.4 C) (Rectal)   Resp 10   Ht 5\' 11"  (1.803 m)   Wt 109.8 kg   SpO2 95%   BMI 33.76 kg/m    REVIEW OF SYSTEMS  PATIENT IS UNABLE TO PROVIDE COMPLETE REVIEW OF SYSTEMS DUE TO SEVERE CRITICAL ILLNESS   PHYSICAL EXAMINATION:  GENERAL:critically ill appearing, +mechanical ventilation  HEAD: Normocephalic, atraumatic.  EYES: Pupils equal, round, reactive to light.  No scleral icterus.  MOUTH: Moist mucosal membrane. NECK: Supple. No thyromegaly. No nodules. No JVD.  PULMONARY: Diminish lung sound, -rhonchi, -wheezing CARDIOVASCULAR: S1 and S2. Regular rate and rhythm. No murmurs, rubs, or gallops.  GASTROINTESTINAL: Soft, nontender, -distended. No masses. Positive bowel sounds. No hepatosplenomegaly.  MUSCULOSKELETAL: No swelling, clubbing, or edema.  NEUROLOGIC: Awake and alert  SKIN: Intact,warm,dry                  Ventilator continued, requirement due to, resp failure    Ventilator Sedation RASS 0 to -2     ASSESSMENT AND PLAN SYNOPSIS  Patient is a 78 y.o. male presents in ICU on mechanical ventilation due to respiratory failure from septic shock    Severe Hypoxic and Hypercapnic Respiratory Failure -Continue Full MV support -Continue Bronchodilator Therapy -Wean Fio2 and PEEP as tolerated   NEUROLOGY -Intubated, alert and awake -Pt indicates discomfort -Plan to extubate tomorrow morning due history of  Myasthenia Gravis, will give milrinone -Give propofol for comfort until tomorrow morning -Decrease fentanyl to 0-50 mcg -Wake up assessment pending  Septic shock -Use vasopressors to keep MAP>65 -Follow ABG and LA -Urine culture show E coli and klebsiella  -2/2 blood culture show gram negative rod, pending reports -Discontinue cefepime and cipro -Start meropenem  CARDIAC ICU monitoring  ID -Continue IV abx as prescibed -Follow up cultures  GI/Nutrition GI PROPHYLAXIS as indicated DIET-->NPO Constipation protocol as indicated  ENDO - ICU hypoglycemic\Hyperglycemia protocol -Check FSBS per protocol  MYASTHENIA GRAVIS - resume physostigmine today   ELECTROLYTES -follow labs as needed -replace as needed -pharmacy consultation and following   DVT/GI PRX ordered TRANSFUSIONS AS NEEDED MONITOR FSBS ASSESS the need for LABS as needed  Family updated in detail. Likely extubation 01/21    CCM time: 35 mins The above time includes time spent in consultation with patient and/or family members and reviewing care plan on multidisciplinary rounds  Merton Border, MD PCCM service Mobile [phone] Pager [phone] 11/27/2018 4:16 PM

## 2018-11-27 NOTE — Progress Notes (Addendum)
[facility] NAME: Mason Wilson    MR#:  [id number]  DATE OF BIRTH:  1941/08/25  SUBJECTIVE:  CHIEF COMPLAINT:   Chief Complaint  Patient presents with  . Shortness of Breath  . Nausea  . Diarrhea  . Rectal Bleeding  Patient seen today On ventilator On warming blanket for low body temperature Tidal volume 500 Rate 14 Peep 5 Fio2 24%  REVIEW OF SYSTEMS:    ROS Patient is critically ill could not be obtained on ventilator  DRUG ALLERGIES:   Allergies  Allergen Reactions  . Altace [Ramipril] Cough    VITALS:  Blood pressure (!) 93/51, pulse 61, temperature 99.3 F (37.4 C), temperature source Rectal, resp. rate 10, height 5\' 11"  (1.803 m), weight 109.8 kg, SpO2 95 %.  PHYSICAL EXAMINATION:   Physical Exam  GENERAL:  78 y.o.-year-old patient lying in the bed on ventilator EYES: Pupils equal, round, reactive to light and accommodation. No scleral icterus. Extraocular muscles intact.  HEENT: Head atraumatic, normocephalic. Oropharynx ET tube noted NECK:  Supple, no jugular venous distention. No thyroid enlargement, no tenderness.  LUNGS: Decreased breath sounds bilaterally, Rales heard in both lungs CARDIOVASCULAR: S1, S2 normal. No murmurs, rubs, or gallops.  ABDOMEN: Soft, nontender, nondistended. Bowel sounds present. No organomegaly or mass.  EXTREMITIES: No cyanosis, clubbing or edema b/l.    NEUROLOGIC: Could not be assessed completely as patient is on ventilator PSYCHIATRIC: The patient is alert and oriented x none.  SKIN: No obvious rash, lesion, or ulcer.   LABORATORY PANEL:   CBC Recent Labs  Lab 11/27/18 0428  WBC 2.7*  HGB 7.7*  HCT 24.0*  PLT 43*   ------------------------------------------------------------------------------------------------------------------ Chemistries  Recent Labs  Lab 11/26/18 2110 11/27/18 0428  NA 141 143  K 5.1 4.0  CL 115* 112*  CO2 18* 25  GLUCOSE 312* 290*   BUN 57* 58*  CREATININE 1.08 1.11  CALCIUM 7.7* 7.6*  MG 1.9  --   AST 29 27  ALT 74* 65*  ALKPHOS 77 66  BILITOT 3.6* 2.5*   ------------------------------------------------------------------------------------------------------------------  Cardiac Enzymes Recent Labs  Lab 11/27/18 0141  TROPONINI 0.29*   ------------------------------------------------------------------------------------------------------------------  RADIOLOGY:  Ct Chest W Contrast  Result Date: 11/26/2018 CLINICAL DATA:  Sepsis. Dyspnea. Nausea. Diarrhea. Abdominal distention. EXAM: CT CHEST, ABDOMEN, AND PELVIS WITH CONTRAST TECHNIQUE: Multidetector CT imaging of the chest, abdomen and pelvis was performed following the standard protocol during bolus administration of intravenous contrast. CONTRAST:  123mL OMNIPAQUE IOHEXOL 300 MG/ML  SOLN COMPARISON:  35019 chest CT.  08/01/2015 CT abdomen/pelvis. FINDINGS: CT CHEST FINDINGS Cardiovascular: Top-normal heart size. No significant pericardial effusion/thickening. 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 Atherosclerotic nonaneurysmal thoracic aorta. Top-normal caliber main pulmonary artery (3.4 cm diameter). No central pulmonary emboli. Mediastinum/Nodes: No discrete thyroid nodules. Unremarkable esophagus. No pathologically enlarged axillary, mediastinal or hilar lymph nodes. Lungs/Pleura: No pneumothorax. No pleural effusion. There is relatively symmetric patchy parahilar consolidation and ground-glass opacity in both lungs, upper lobe predominant. No lung masses. Musculoskeletal: No aggressive appearing focal osseous lesions. Moderate thoracic spondylosis. CT ABDOMEN PELVIS FINDINGS Hepatobiliary: Diffusely irregular liver surface compatible with hepatic cirrhosis. No liver masses. Normal gallbladder with no radiopaque cholelithiasis. No biliary ductal dilatation. Pancreas: There is diffuse haziness of the peripancreatic fat extending into anterior paranephric  retroperitoneal spaces bilaterally, suggestive of acute pancreatitis. No peripancreatic fluid collections. Preserved pancreatic parenchymal enhancement. No pancreatic mass or duct dilation. Spleen: Mild to moderate splenomegaly with craniocaudal  splenic length 16.2 cm. No splenic mass. Adrenals/Urinary Tract: Normal adrenals. No hydronephrosis. Simple 2.3 cm lateral lower right renal cyst. Normal bladder. Stomach/Bowel: Normal non-distended stomach. Normal caliber small bowel with no small bowel wall thickening. Normal appendix. Normal large bowel with no diverticulosis, large bowel wall thickening or pericolonic fat stranding. Vascular/Lymphatic: Atherosclerotic nonaneurysmal abdominal aorta. Patent portal, splenic, hepatic and renal veins. No pathologically enlarged lymph nodes in the abdomen or pelvis. Reproductive: Moderate-to-marked prostatomegaly with bilateral prostatic fluid collections measuring 3.3 x 3.0 cm on the right and 4.0 x 2.9 cm on the left, new since 08/01/2015 CT. Haziness of the periprostatic fat. Other: No pneumoperitoneum, ascites or focal fluid collection. Musculoskeletal: No aggressive appearing focal osseous lesions. Moderate lumbar spondylosis. IMPRESSION: 1. Relatively symmetric patchy parahilar consolidation and ground-glass opacity in both lungs, upper lobe predominant. Favor multilobar bronchopneumonia, although flash pulmonary edema could also have this appearance. 2. Acute uncomplicated pancreatitis. 3. 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 4. Hepatic cirrhosis.  Splenomegaly.  No ascites. 5. 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 6.  Aortic Atherosclerosis (ICD10-I70.0). Electronically Signed   By: Ilona Sorrel M.D.   On: 11/26/2018 05:52   Ct Abdomen Pelvis W Contrast  Result Date:  11/26/2018 CLINICAL DATA:  Sepsis. Dyspnea. Nausea. Diarrhea. Abdominal distention. EXAM: CT CHEST, ABDOMEN, AND PELVIS WITH CONTRAST TECHNIQUE: Multidetector CT imaging of the chest, abdomen and pelvis was performed following the standard protocol during bolus administration of intravenous contrast. CONTRAST:  116mL OMNIPAQUE IOHEXOL 300 MG/ML  SOLN COMPARISON:  35019 chest CT.  08/01/2015 CT abdomen/pelvis. FINDINGS: CT CHEST FINDINGS Cardiovascular: Top-normal heart size. No significant pericardial effusion/thickening. 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 Atherosclerotic nonaneurysmal thoracic aorta. Top-normal caliber main pulmonary artery (3.4 cm diameter). No central pulmonary emboli. Mediastinum/Nodes: No discrete thyroid nodules. Unremarkable esophagus. No pathologically enlarged axillary, mediastinal or hilar lymph nodes. Lungs/Pleura: No pneumothorax. No pleural effusion. There is relatively symmetric patchy parahilar consolidation and ground-glass opacity in both lungs, upper lobe predominant. No lung masses. Musculoskeletal: No aggressive appearing focal osseous lesions. Moderate thoracic spondylosis. CT ABDOMEN PELVIS FINDINGS Hepatobiliary: Diffusely irregular liver surface compatible with hepatic cirrhosis. No liver masses. Normal gallbladder with no radiopaque cholelithiasis. No biliary ductal dilatation. Pancreas: There is diffuse haziness of the peripancreatic fat extending into anterior paranephric retroperitoneal spaces bilaterally, suggestive of acute pancreatitis. No peripancreatic fluid collections. Preserved pancreatic parenchymal enhancement. No pancreatic mass or duct dilation. Spleen: Mild to moderate splenomegaly with craniocaudal splenic length 16.2 cm. No splenic mass. Adrenals/Urinary Tract: Normal adrenals. No hydronephrosis. Simple 2.3 cm lateral lower right renal cyst. Normal bladder. Stomach/Bowel: Normal non-distended stomach. Normal caliber small bowel with no small  bowel wall thickening. Normal appendix. Normal large bowel with no diverticulosis, large bowel wall thickening or pericolonic fat stranding. Vascular/Lymphatic: Atherosclerotic nonaneurysmal abdominal aorta. Patent portal, splenic, hepatic and renal veins. No pathologically enlarged lymph nodes in the abdomen or pelvis. Reproductive: Moderate-to-marked prostatomegaly with bilateral prostatic fluid collections measuring 3.3 x 3.0 cm on the right and 4.0 x 2.9 cm on the left, new since 08/01/2015 CT. Haziness of the periprostatic fat. Other: No pneumoperitoneum, ascites or focal fluid collection. Musculoskeletal: No aggressive appearing focal osseous lesions. Moderate lumbar spondylosis. IMPRESSION: 1. Relatively symmetric patchy parahilar consolidation and ground-glass opacity in both lungs, upper lobe predominant. Favor multilobar bronchopneumonia, although flash pulmonary edema could also have this appearance. 2.  Acute uncomplicated pancreatitis. 3. 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 4. Hepatic cirrhosis.  Splenomegaly.  No ascites. 5. 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 6.  Aortic Atherosclerosis (ICD10-I70.0). Electronically Signed   By: Ilona Sorrel M.D.   On: 11/26/2018 05:52   Dg Chest Port 1 View  Result Date: 11/26/2018 CLINICAL DATA:  Hypoxia.  Central catheter placement EXAM: PORTABLE CHEST 1 VIEW COMPARISON:  November 26, 2018 study obtained earlier in the day FINDINGS: Central catheter tip is in the superior vena cava. Endotracheal tube tip is 4.7 cm above the carina. Nasogastric tube tip and side port are in the stomach. No pneumothorax. There is consolidation in the medial left base with small left pleural effusion. There is mild perihilar interstitial pulmonary edema. There is cardiomegaly with mild pulmonary  venous hypertension. IMPRESSION: Tube and catheter positions as described without pneumothorax. Pulmonary vascular congestion with perihilar interstitial edema. New small area consolidation medial left base with small left pleural effusion. Suspect developing pneumonia left base, although alveolar edema could present in this manner. Electronically Signed   By: Lowella Grip III M.D.   On: 11/26/2018 13:29   Dg Chest Portable 1 View  Result Date: 11/26/2018 CLINICAL DATA:  Intubated EXAM: PORTABLE CHEST 1 VIEW COMPARISON:  Chest radiograph from earlier today. FINDINGS: Endotracheal tube tip is 4.2 cm above the carina. Enteric tube enters stomach with the tip not seen on this image. Stable cardiomediastinal silhouette with borderline mild cardiomegaly. No pneumothorax. No pleural effusion. Rapidly worsening fluffy parahilar opacities in both lungs. IMPRESSION: 1. Well-positioned endotracheal and enteric tubes. 2. Rapidly worsening fluffy parahilar opacities in both lungs and borderline mild cardiomegaly, compatible with congestive heart failure with flash pulmonary edema. Electronically Signed   By: Ilona Sorrel M.D.   On: 11/26/2018 06:33   Dg Chest Port 1 View  Result Date: 11/26/2018 CLINICAL DATA:  Sepsis and hypoxia EXAM: PORTABLE CHEST 1 VIEW COMPARISON:  06/23/2016 chest radiograph. FINDINGS: Stable cardiomediastinal silhouette with normal heart size. No pneumothorax. No pleural effusion. Mild patchy upper left lung opacity. IMPRESSION: Mild patchy upper left lung opacity, which could represent asymmetric mild pulmonary edema versus developing pneumonia. Chest radiograph follow-up suggested. Electronically Signed   By: Ilona Sorrel M.D.   On: 11/26/2018 04:14     ASSESSMENT AND PLAN:   78 year old male patient with history of hypertension, hyperlipidemia, melanoma, hypogonadism currently in the ICU for fever, chills, nausea abdominal discomfort  -Acute septic shock Continue IV fluids and IV  dopamine drip for support of blood pressure IV pressor medications Broad-spectrum antibiotics Follow-up cultures  -Sepsis from E. coli bacteremia and Enterobacteriaceae Follow-up culture and sensitivities Start IV meropenem abx  -Acute respiratory failure with hypoxia Continue mechanical ventilation Intensivist consult can continue vent bundle  -Prostate abscess Status post TURP for prostate abscess Continue IV meropenem abx Follow-up sensitivities  -E. coli and Klebsiella UTI Start iv meropenem abx Follow-up sensitivities  -Pneumonia Continue broad-spectrum IV antibiotics Follow-up cultures  -Acute pancreatitis without necrosis Check lipase levels N.p.o. for now  -Pancytopenia probably be from severe sepsis Consider hematology consult Avoid blood thinner medication  -Myasthenia gravis On oral pyridostigmine Supportive care   All the records are reviewed and case discussed with Care Management/Social Worker. Management plans discussed with the patient, family and they are in agreement.  CODE STATUS: Full code  DVT Prophylaxis: SCDs  TOTAL  TIME TAKING CARE OF THIS PATIENT: 38 minutes.   POSSIBLE D/C IN 3 to 4 DAYS, DEPENDING ON CLINICAL CONDITION.  Saundra Shelling M.D on 11/27/2018 at 12:02 PM  Between 7am to 6pm - Pager - [phone]  After 6pm go to [email or web address] - password [email or web address] [facility]  Office  [phone]  CC: Primary care physician; [name], MD  Note: This dictation was prepared with Dragon dictation along with smaller phrase technology. Any transcriptional errors that result from this process are unintentional.

## 2018-11-27 NOTE — Consult Note (Signed)
Pharmacy Antibiotic Note  Mason Wilson. is a 78 y.o. male admitted on 11/26/2018 with sepsis of unclear etiology.  Pharmacy has been consulted for meropenem dosing.  He has had 4 urinary tract infections recently most recently completing a course of Bactrim and then Keflex about 2 weeks ago.The most recent culture was E coli resistant only to ampicillin. His SCr has now returned his approximate baseline of 1.2 mg/dL. He was originally started on vancomycin and cefepime but vancomycin has been discontinued and cefepime is being changed to meropenem to cover ESBL until sensitivities return  Plan:   Begin meropenem 1 gram IV every 8 hours  Height: 5\' 11"  (180.3 cm) Weight: 242 lb 1 oz (109.8 kg) IBW/kg (Calculated) : 75.3  Temp (24hrs), Avg:96.7 F (35.9 C), Min:93.9 F (34.4 C), Max:99.3 F (37.4 C)  Recent Labs  Lab 11/26/18 0343 11/26/18 0345 11/26/18 0603 11/26/18 1412 11/26/18 2110 11/27/18 0428  WBC 0.8*  --   --   --  10.9* 2.7*  CREATININE 1.60*  --   --  1.31* 1.08 1.11  LATICACIDVEN  --  5.8* 2.8*  --  1.7  --     Estimated Creatinine Clearance: 71.4 mL/min (by C-G formula based on SCr of 1.11 mg/dL).    Allergies  Allergen Reactions  . Altace [Ramipril] Cough    Antimicrobials this admission: Meropenem 1/20 >> Ciprofloxacin 1/19 >> vancomycin 1/19 >> 1/20 cefepime 1/19 >> 1/20 Metronidazole 1/19  Microbiology results: 1/19 BCx: E coli 1/19 UCx: E coli, 40k CFU K pneumoniae  Thank you for allowing pharmacy to be a part of this patient's care.  Dallie Piles, PharmD 11/27/2018 12:41 PM

## 2018-11-27 NOTE — Progress Notes (Signed)
Spoke with ICU care team and Dr. Estanislado Pandy and cipro has been discontinued and meropenem is started.  Will need to consider alternative to Cipro upon discharge due to his history of myesthesia gravis.

## 2018-11-27 NOTE — Progress Notes (Signed)
Urology Consult Follow Up  Subjective: S/P TURP for prostate abscess 11/26/2018  Condition guarded.  Still intubated.  WBC count 2.7, down from 10.9.  Creatinine 1.11.  Hypothermic last night.  Blood cultures positive for enterobacteriaceae and e.coli.  Urine culture pending.     Anti-infectives: Anti-infectives (From admission, onward)   Start     Dose/Rate Route Frequency Ordered Stop   11/27/18 0000  vancomycin (VANCOCIN) IVPB 1000 mg/200 mL premix  Status:  Discontinued     1,000 mg 200 mL/hr over 60 Minutes Intravenous Every 24 hours 11/26/18 0942 11/26/18 1728   11/26/18 1800  ceFEPIme (MAXIPIME) 2 g in sodium chloride 0.9 % 100 mL IVPB     2 g 200 mL/hr over 30 Minutes Intravenous Every 12 hours 11/26/18 0942     11/26/18 1115  ciprofloxacin (CIPRO) IVPB 400 mg     400 mg 200 mL/hr over 60 Minutes Intravenous Every 12 hours 11/26/18 1108     11/26/18 0345  ceFEPIme (MAXIPIME) 2 g in sodium chloride 0.9 % 100 mL IVPB     2 g 200 mL/hr over 30 Minutes Intravenous  Once 11/26/18 0339 11/26/18 0451   11/26/18 0345  metroNIDAZOLE (FLAGYL) IVPB 500 mg  Status:  Discontinued     500 mg 100 mL/hr over 60 Minutes Intravenous Every 8 hours 11/26/18 0339 11/26/18 1110   11/26/18 0345  vancomycin (VANCOCIN) IVPB 1000 mg/200 mL premix  Status:  Discontinued     1,000 mg 200 mL/hr over 60 Minutes Intravenous  Once 11/26/18 0339 11/26/18 0341   11/26/18 0345  vancomycin (VANCOCIN) 2,000 mg in sodium chloride 0.9 % 500 mL IVPB     2,000 mg 250 mL/hr over 120 Minutes Intravenous  Once 11/26/18 0344 11/26/18 0617      Current Facility-Administered Medications  Medication Dose Route Frequency Provider Last Rate Last Dose  . 0.9 %  sodium chloride infusion  250 mL Intravenous PRN Pyreddy, Reatha Harps, MD      . acetaminophen (TYLENOL) tablet 650 mg  650 mg Oral Q6H PRN Pyreddy, Reatha Harps, MD       Or  . acetaminophen (TYLENOL) suppository 650 mg  650 mg Rectal Q6H PRN Pyreddy, Pavan, MD      .  ceFEPIme (MAXIPIME) 2 g in sodium chloride 0.9 % 100 mL IVPB  2 g Intravenous Q12H Irine Seal, MD 200 mL/hr at 11/27/18 0606 2 g at 11/27/18 0606  . chlorhexidine gluconate (MEDLINE KIT) (PERIDEX) 0.12 % solution 15 mL  15 mL Mouth Rinse BID Rosine Door, MD   15 mL at 11/26/18 1945  . ciprofloxacin (CIPRO) IVPB 400 mg  400 mg Intravenous Q12H Irine Seal, MD 200 mL/hr at 11/26/18 2333 400 mg at 11/26/18 2333  . dexmedetomidine (PRECEDEX) 400 MCG/100ML (4 mcg/mL) infusion  0.4-1.2 mcg/kg/hr Intravenous Titrated Irine Seal, MD 25 mL/hr at 11/26/18 1006 1 mcg/kg/hr at 11/26/18 1006  . DOPamine (INTROPIN) 800 mg in dextrose 5 % 250 mL (3.2 mg/mL) infusion  0-20 mcg/kg/min Intravenous Titrated Tukov-Yual, Magdalene S, NP 7.21 mL/hr at 11/27/18 0606 3.5 mcg/kg/min at 11/27/18 0606  . fentaNYL (SUBLIMAZE) injection 200 mcg  200 mcg Intravenous Once Irine Seal, MD      . fentaNYL (SUBLIMAZE) injection 50-100 mcg  50-100 mcg Intravenous Q1H PRN Tukov-Yual, Magdalene S, NP   100 mcg at 11/27/18 0600  . fentaNYL 2538mg in NS 2570m(1075mml) infusion-PREMIX  0-400 mcg/hr Intravenous Continuous BasRosine DoorD 20 mL/hr at 11/27/18 0606 200 mcg/hr at 11/27/18 0606  .  hydrocortisone sodium succinate (SOLU-CORTEF) 100 MG injection 100 mg  100 mg Intravenous Q8H Rosine Door, MD   100 mg at 11/27/18 0333  . insulin aspart (novoLOG) injection 0-15 Units  0-15 Units Subcutaneous Q4H Tukov-Yual, Magdalene S, NP   8 Units at 11/27/18 0343  . lactated ringers infusion   Intravenous Continuous Rosine Door, MD 100 mL/hr at 11/27/18 0606    . MEDLINE mouth rinse  15 mL Mouth Rinse 10 times per day Rosine Door, MD   15 mL at 11/27/18 0610  . midazolam (VERSED) injection 2-4 mg  2-4 mg Intravenous Q1H PRN Tukov-Yual, Magdalene S, NP   4 mg at 11/27/18 0559  . norepinephrine (LEVOPHED) 16 mg in D5W 249m premix infusion  0-40 mcg/min Intravenous Continuous Tukov-Yual, MArlyss Gandy NP   Stopped at 11/26/18 2311   . ondansetron (ZOFRAN) tablet 4 mg  4 mg Oral Q6H PRN Pyreddy, PReatha Harps MD       Or  . ondansetron (ZOFRAN) injection 4 mg  4 mg Intravenous Q6H PRN Pyreddy, Pavan, MD      . propofol (DIPRIVAN) 10 mg/mL bolus/IV push 50 mg  50 mg Intravenous Once WIrine Seal MD      . propofol (DIPRIVAN) 1000 MG/100ML infusion  5-80 mcg/kg/min Intravenous Continuous WIrine Seal MD   Stopped at 11/27/18 0154  . sodium chloride flush (NS) 0.9 % injection 3 mL  3 mL Intravenous Q12H PSaundra Shelling MD   3 mL at 11/26/18 2206  . sodium chloride flush (NS) 0.9 % injection 3 mL  3 mL Intravenous PRN Pyreddy, PReatha Harps MD      . vasopressin (PITRESSIN) 40 Units in sodium chloride 0.9 % 250 mL (0.16 Units/mL) infusion  0.03 Units/min Intravenous Continuous WIrine Seal MD   Stopped at 11/26/18 2247     Objective: Vital signs in last 24 hours: Temp:  [93.9 F (34.4 C)-100.6 F (38.1 C)] 97.3 F (36.3 C) (01/20 0415) Pulse Rate:  [47-90] 61 (01/20 0645) Resp:  [0-31] 10 (01/20 0645) BP: (79-187)/(44-82) 93/51 (01/20 0645) SpO2:  [91 %-100 %] 99 % (01/20 0645) FiO2 (%):  [30 %-50 %] 30 % (01/20 0334) Weight:  [109.8 kg] 109.8 kg (01/19 1200)  Intake/Output from previous day: 01/19 0701 - 01/20 0700 In: 4703.1 [I.V.:3303.1; IV Piggyback:1400] Out: 10254[Urine:1775] Intake/Output this shift: No intake/output data recorded.   Physical Exam  Constitutional:  On mechanical ventilator Respiratory: Intubated  GU: Foley draining pink-tinged urine Covered with Bair Hugger - on ambient air    Lab Results:  Recent Labs    11/26/18 2110 11/27/18 0428  WBC 10.9* 2.7*  HGB 9.2* 7.7*  HCT 28.5* 24.0*  PLT 65* 43*   BMET Recent Labs    11/26/18 2110 11/27/18 0428  NA 141 143  K 5.1 4.0  CL 115* 112*  CO2 18* 25  GLUCOSE 312* 290*  BUN 57* 58*  CREATININE 1.08 1.11  CALCIUM 7.7* 7.6*   PT/INR Recent Labs    11/26/18 2110 11/27/18 0428  LABPROT 17.0* 16.3*  INR 1.40 1.33   ABG Recent Labs     11/26/18 0645 11/26/18 2135  PHART 7.30* 7.30*  HCO3 19.2* 20.7    Studies/Results: Ct Chest W Contrast  Result Date: 11/26/2018 CLINICAL DATA:  Sepsis. Dyspnea. Nausea. Diarrhea. Abdominal distention. EXAM: CT CHEST, ABDOMEN, AND PELVIS WITH CONTRAST TECHNIQUE: Multidetector CT imaging of the chest, abdomen and pelvis was performed following the standard protocol during bolus administration of intravenous contrast. CONTRAST:  1013mOMNIPAQUE  IOHEXOL 300 MG/ML  SOLN COMPARISON:  35019 chest CT.  08/01/2015 CT abdomen/pelvis. FINDINGS: CT CHEST FINDINGS Cardiovascular: Top-normal heart size. No significant pericardial effusion/thickening. 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 Atherosclerotic nonaneurysmal thoracic aorta. Top-normal caliber main pulmonary artery (3.4 cm diameter). No central pulmonary emboli. Mediastinum/Nodes: No discrete thyroid nodules. Unremarkable esophagus. No pathologically enlarged axillary, mediastinal or hilar lymph nodes. Lungs/Pleura: No pneumothorax. No pleural effusion. There is relatively symmetric patchy parahilar consolidation and ground-glass opacity in both lungs, upper lobe predominant. No lung masses. Musculoskeletal: No aggressive appearing focal osseous lesions. Moderate thoracic spondylosis. CT ABDOMEN PELVIS FINDINGS Hepatobiliary: Diffusely irregular liver surface compatible with hepatic cirrhosis. No liver masses. Normal gallbladder with no radiopaque cholelithiasis. No biliary ductal dilatation. Pancreas: There is diffuse haziness of the peripancreatic fat extending into anterior paranephric retroperitoneal spaces bilaterally, suggestive of acute pancreatitis. No peripancreatic fluid collections. Preserved pancreatic parenchymal enhancement. No pancreatic mass or duct dilation. Spleen: Mild to moderate splenomegaly with craniocaudal splenic length 16.2 cm. No splenic mass. Adrenals/Urinary Tract: Normal adrenals. No hydronephrosis. Simple 2.3 cm  lateral lower right renal cyst. Normal bladder. Stomach/Bowel: Normal non-distended stomach. Normal caliber small bowel with no small bowel wall thickening. Normal appendix. Normal large bowel with no diverticulosis, large bowel wall thickening or pericolonic fat stranding. Vascular/Lymphatic: Atherosclerotic nonaneurysmal abdominal aorta. Patent portal, splenic, hepatic and renal veins. No pathologically enlarged lymph nodes in the abdomen or pelvis. Reproductive: Moderate-to-marked prostatomegaly with bilateral prostatic fluid collections measuring 3.3 x 3.0 cm on the right and 4.0 x 2.9 cm on the left, new since 08/01/2015 CT. Haziness of the periprostatic fat. Other: No pneumoperitoneum, ascites or focal fluid collection. Musculoskeletal: No aggressive appearing focal osseous lesions. Moderate lumbar spondylosis. IMPRESSION: 1. Relatively symmetric patchy parahilar consolidation and ground-glass opacity in both lungs, upper lobe predominant. Favor multilobar bronchopneumonia, although flash pulmonary edema could also have this appearance. 2. Acute uncomplicated pancreatitis. 3. 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 4. Hepatic cirrhosis.  Splenomegaly.  No ascites. 5. 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 6.  Aortic Atherosclerosis (ICD10-I70.0). Electronically Signed   By: Ilona Sorrel M.D.   On: 11/26/2018 05:52   Ct Abdomen Pelvis W Contrast  Result Date: 11/26/2018 CLINICAL DATA:  Sepsis. Dyspnea. Nausea. Diarrhea. Abdominal distention. EXAM: CT CHEST, ABDOMEN, AND PELVIS WITH CONTRAST TECHNIQUE: Multidetector CT imaging of the chest, abdomen and pelvis was performed following the standard protocol during bolus administration of intravenous contrast. CONTRAST:  153m OMNIPAQUE IOHEXOL 300 MG/ML  SOLN COMPARISON:  [address]  chest CT.  08/01/2015 CT abdomen/pelvis. FINDINGS: CT CHEST FINDINGS Cardiovascular: Top-normal heart size. No significant pericardial effusion/thickening. 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 Atherosclerotic nonaneurysmal thoracic aorta. Top-normal caliber main pulmonary artery (3.4 cm diameter). No central pulmonary emboli. Mediastinum/Nodes: No discrete thyroid nodules. Unremarkable esophagus. No pathologically enlarged axillary, mediastinal or hilar lymph nodes. Lungs/Pleura: No pneumothorax. No pleural effusion. There is relatively symmetric patchy parahilar consolidation and ground-glass opacity in both lungs, upper lobe predominant. No lung masses. Musculoskeletal: No aggressive appearing focal osseous lesions. Moderate thoracic spondylosis. CT ABDOMEN PELVIS FINDINGS Hepatobiliary: Diffusely irregular liver surface compatible with hepatic cirrhosis. No liver masses. Normal gallbladder with no radiopaque cholelithiasis. No biliary ductal dilatation. Pancreas: There is diffuse haziness of the peripancreatic fat extending into anterior paranephric retroperitoneal spaces bilaterally, suggestive of acute pancreatitis. No peripancreatic fluid collections. Preserved pancreatic parenchymal enhancement. No  pancreatic mass or duct dilation. Spleen: Mild to moderate splenomegaly with craniocaudal splenic length 16.2 cm. No splenic mass. Adrenals/Urinary Tract: Normal adrenals. No hydronephrosis. Simple 2.3 cm lateral lower right renal cyst. Normal bladder. Stomach/Bowel: Normal non-distended stomach. Normal caliber small bowel with no small bowel wall thickening. Normal appendix. Normal large bowel with no diverticulosis, large bowel wall thickening or pericolonic fat stranding. Vascular/Lymphatic: Atherosclerotic nonaneurysmal abdominal aorta. Patent portal, splenic, hepatic and renal veins. No pathologically enlarged lymph nodes in the abdomen or pelvis. Reproductive: Moderate-to-marked prostatomegaly  with bilateral prostatic fluid collections measuring 3.3 x 3.0 cm on the right and 4.0 x 2.9 cm on the left, new since 08/01/2015 CT. Haziness of the periprostatic fat. Other: No pneumoperitoneum, ascites or focal fluid collection. Musculoskeletal: No aggressive appearing focal osseous lesions. Moderate lumbar spondylosis. IMPRESSION: 1. Relatively symmetric patchy parahilar consolidation and ground-glass opacity in both lungs, upper lobe predominant. Favor multilobar bronchopneumonia, although flash pulmonary edema could also have this appearance. 2. Acute uncomplicated pancreatitis. 3. 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 4. Hepatic cirrhosis.  Splenomegaly.  No ascites. 5. 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 6.  Aortic Atherosclerosis (ICD10-I70.0). Electronically Signed   By: Ilona Sorrel M.D.   On: 11/26/2018 05:52   Dg Chest Port 1 View  Result Date: 11/26/2018 CLINICAL DATA:  Hypoxia.  Central catheter placement EXAM: PORTABLE CHEST 1 VIEW COMPARISON:  November 26, 2018 study obtained earlier in the day FINDINGS: Central catheter tip is in the superior vena cava. Endotracheal tube tip is 4.7 cm above the carina. Nasogastric tube tip and side port are in the stomach. No pneumothorax. There is consolidation in the medial left base with small left pleural effusion. There is mild perihilar interstitial pulmonary edema. There is cardiomegaly with mild pulmonary venous hypertension. IMPRESSION: Tube and catheter positions as described without pneumothorax. Pulmonary vascular congestion with perihilar interstitial edema. New small area consolidation medial left base with small left pleural effusion. Suspect developing pneumonia left base, although alveolar edema could present in this manner. Electronically Signed   By: Lowella Grip III M.D.   On: 11/26/2018 13:29   Dg Chest Portable 1 View  Result Date: 11/26/2018 CLINICAL DATA:  Intubated EXAM: PORTABLE CHEST 1 VIEW COMPARISON:  Chest radiograph from earlier today. FINDINGS: Endotracheal tube tip is 4.2 cm above the carina. Enteric tube enters stomach with the tip not seen on this image. Stable cardiomediastinal silhouette with borderline mild cardiomegaly. No pneumothorax. No pleural effusion. Rapidly worsening fluffy parahilar opacities in both lungs. IMPRESSION: 1. Well-positioned endotracheal and enteric tubes. 2. Rapidly worsening fluffy parahilar opacities in both lungs and borderline mild cardiomegaly, compatible with congestive heart failure with flash pulmonary edema. Electronically Signed   By: Ilona Sorrel M.D.   On: 11/26/2018 06:33   Dg Chest Port 1 View  Result Date: 11/26/2018 CLINICAL DATA:  Sepsis and hypoxia EXAM: PORTABLE CHEST 1 VIEW COMPARISON:  06/23/2016 chest radiograph. FINDINGS: Stable cardiomediastinal silhouette with normal heart size. No pneumothorax. No pleural effusion. Mild patchy upper left lung opacity. IMPRESSION: Mild patchy upper left lung opacity, which could represent asymmetric mild pulmonary edema versus developing pneumonia. Chest radiograph follow-up suggested. Electronically Signed   By: Ilona Sorrel M.D.   On: 11/26/2018 04:14     Assessment and Plan 77 year old male with septic shock related to prostate abscess who is status  post TURP on 11/26/2018  - condition guarded, still intubated   - continue Foley   - continue broad spectrum antibiotics -blood cultures + for enterobacteriaceae and e.coli - urine culture is pending  - will need prolong therapy for 4 to 6 weeks of Cipro if organisms are sensitive   - continue to follow     LOS: 1 day    [facility] 11/27/2018

## 2018-11-28 ENCOUNTER — Inpatient Hospital Stay: Payer: Medicare Other

## 2018-11-28 DIAGNOSIS — J9601 Acute respiratory failure with hypoxia: Secondary | ICD-10-CM

## 2018-11-28 DIAGNOSIS — R7881 Bacteremia: Secondary | ICD-10-CM

## 2018-11-28 DIAGNOSIS — E291 Testicular hypofunction: Secondary | ICD-10-CM

## 2018-11-28 DIAGNOSIS — D696 Thrombocytopenia, unspecified: Secondary | ICD-10-CM

## 2018-11-28 LAB — HEPATIC FUNCTION PANEL
ALT: 52 U/L — ABNORMAL HIGH (ref 0–44)
AST: 18 U/L (ref 15–41)
Albumin: 2.5 g/dL — ABNORMAL LOW (ref 3.5–5.0)
Alkaline Phosphatase: 63 U/L (ref 38–126)
BILIRUBIN DIRECT: 0.5 mg/dL — AB (ref 0.0–0.2)
Indirect Bilirubin: 0.9 mg/dL (ref 0.3–0.9)
Total Bilirubin: 1.4 mg/dL — ABNORMAL HIGH (ref 0.3–1.2)
Total Protein: 5.1 g/dL — ABNORMAL LOW (ref 6.5–8.1)

## 2018-11-28 LAB — BASIC METABOLIC PANEL
Anion gap: 4 — ABNORMAL LOW (ref 5–15)
BUN: 56 mg/dL — AB (ref 8–23)
CO2: 28 mmol/L (ref 22–32)
Calcium: 8.3 mg/dL — ABNORMAL LOW (ref 8.9–10.3)
Chloride: 115 mmol/L — ABNORMAL HIGH (ref 98–111)
Creatinine, Ser: 0.97 mg/dL (ref 0.61–1.24)
GFR calc Af Amer: >60 mL/min (ref >60)
GFR calc non Af Amer: >60 mL/min (ref >60)
GLUCOSE: 194 mg/dL — AB (ref 70–99)
Potassium: 3.5 mmol/L (ref 3.5–5.1)
Sodium: 147 mmol/L — ABNORMAL HIGH (ref 135–145)

## 2018-11-28 LAB — GLUCOSE, CAPILLARY
Glucose-Capillary: 187 mg/dL — ABNORMAL HIGH (ref 70–99)
Glucose-Capillary: 165 mg/dL — ABNORMAL HIGH (ref 70–99)
Glucose-Capillary: 198 mg/dL — ABNORMAL HIGH (ref 70–99)
Glucose-Capillary: 195 mg/dL — ABNORMAL HIGH (ref 70–99)
Glucose-Capillary: 294 mg/dL — ABNORMAL HIGH (ref 70–99)
Glucose-Capillary: 231 mg/dL — ABNORMAL HIGH (ref 70–99)

## 2018-11-28 LAB — CBC
HCT: 24 % — ABNORMAL LOW (ref 39.0–52.0)
Hemoglobin: 7.9 g/dL — ABNORMAL LOW (ref 13.0–17.0)
MCH: 30.9 pg (ref 26.0–34.0)
MCHC: 32.9 g/dL (ref 30.0–36.0)
MCV: 93.8 fL (ref 80.0–100.0)
Platelets: 32 10*3/uL — ABNORMAL LOW (ref 150–400)
RBC: 2.56 MIL/uL — ABNORMAL LOW (ref 4.22–5.81)
RDW: 14.9 % (ref 11.5–15.5)
WBC: 3.6 10*3/uL — ABNORMAL LOW (ref 4.0–10.5)
nRBC: 0 % (ref 0.0–0.2)

## 2018-11-28 LAB — SURGICAL PATHOLOGY

## 2018-11-28 MED ORDER — PYRIDOSTIGMINE BROMIDE 60 MG PO TABS
60.0000 mg | ORAL_TABLET | Freq: Four times a day (QID) | ORAL | Status: DC
  Administered 2018-11-28 – 2018-11-30 (×9): 60 mg via ORAL
  Filled 2018-11-28 (×11): qty 1

## 2018-11-28 MED ORDER — CHLORHEXIDINE GLUCONATE 0.12 % MT SOLN
15.0000 mL | Freq: Two times a day (BID) | OROMUCOSAL | Status: DC
  Administered 2018-11-28 – 2018-11-29 (×2): 15 mL via OROMUCOSAL
  Filled 2018-11-28: qty 15

## 2018-11-28 MED ORDER — ORAL CARE MOUTH RINSE: 15.0000 mL | Freq: Two times a day (BID) | OROMUCOSAL | Status: DC

## 2018-11-28 MED ORDER — POTASSIUM CHLORIDE 20 MEQ/15ML (10%) PO SOLN
40.0000 meq | Freq: Once | ORAL | Status: AC
  Administered 2018-11-28: 40 meq
  Filled 2018-11-28: qty 30

## 2018-11-28 MED ORDER — LIDOCAINE VISCOUS HCL 2 % MT SOLN
15.0000 mL | OROMUCOSAL | Status: DC | PRN
  Filled 2018-11-28 (×2): qty 15

## 2018-11-28 MED ORDER — CEFAZOLIN SODIUM-DEXTROSE 2-4 GM/100ML-% IV SOLN
2.0000 g | Freq: Three times a day (TID) | INTRAVENOUS | Status: DC
  Administered 2018-11-28 – 2018-11-30 (×7): 2 g via INTRAVENOUS
  Filled 2018-11-28 (×11): qty 100

## 2018-11-28 MED ORDER — INSULIN GLARGINE 100 UNIT/ML ~~LOC~~ SOLN
10.0000 [IU] | Freq: Every day | SUBCUTANEOUS | Status: DC
  Administered 2018-11-28 – 2018-11-30 (×3): 10 [IU] via SUBCUTANEOUS
  Filled 2018-11-28 (×4): qty 0.1

## 2018-11-28 MED ORDER — FUROSEMIDE 10 MG/ML IJ SOLN
40.0000 mg | Freq: Once | INTRAMUSCULAR | Status: AC
  Administered 2018-11-28: 40 mg via INTRAVENOUS
  Filled 2018-11-28: qty 4

## 2018-11-28 MED ORDER — POTASSIUM CHLORIDE 2 MEQ/ML IV SOLN
INTRAVENOUS | Status: DC
  Administered 2018-11-28 – 2018-11-30 (×4): via INTRAVENOUS
  Filled 2018-11-28 (×5): qty 1000

## 2018-11-28 MED ORDER — HYDROCORTISONE NA SUCCINATE PF 100 MG IJ SOLR
25.0000 mg | Freq: Two times a day (BID) | INTRAMUSCULAR | Status: DC
  Administered 2018-11-28: 25 mg via INTRAVENOUS
  Filled 2018-11-28: qty 2

## 2018-11-28 NOTE — Progress Notes (Signed)
Pharmacy Antibiotic Note  Mason Wilson. is a 78 y.o. male admitted on 11/26/2018 with bacteremia secondary to UTI. Presented to ED with SOB, N/D, chills, vomiting, and AMS upon arrival. 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 Has a prostatic abscess that is being treated. Pharmacy has been consulted for cefazolin dosing.  Plan: Initiate cefazolin 2g q8h starting @1400  on 1/21 for bacteremia. Monitor renal function, hepatic function, and cbc.    Height: 5\' 11"  (180.3 cm) Weight: 242 lb 1 oz (109.8 kg) IBW/kg (Calculated) : 75.3  Temp (24hrs), Avg:98.3 F (36.8 C), Min:97.9 F (36.6 C), Max:98.8 F (37.1 C)  Recent Labs  Lab 11/26/18 0343 11/26/18 0345 11/26/18 0603 11/26/18 1412 11/26/18 2110 11/27/18 0428 11/28/18 0436  WBC 0.8*  --   --   --  10.9* 2.7* 3.6*  CREATININE 1.60*  --   --  1.31* 1.08 1.11 0.97  LATICACIDVEN  --  5.8* 2.8*  --  1.7  --   --     Estimated Creatinine Clearance: 81.6 mL/min (by C-G formula based on SCr of 0.97 mg/dL).    Allergies  Allergen Reactions  . Altace [Ramipril] Cough    Antimicrobials this admission: 1/21 cefazolin >> 1/20 Meropenem >> 1/21 1/19 ciprofloxacin >> 1/20 1/19 cefepime >> 1/20  Microbiology results: 1/19 BCx: E. coli 1/19 UCx: >100,000 E.Coli and 40,000 K. Pneumoniae. Sensitive to cefazolin. 1/19 MRSA [id number]  Thank you for allowing pharmacy to be a part of this patient's care.  Anaysia Germer Marcellina Millin [facility] 11/28/2018 10:52 AM

## 2018-11-28 NOTE — Progress Notes (Signed)
CRITICAL CARE NOTE  CC  Follow up respiratory failure on mechanical ventilator   SUBJECTIVE Patient remains intubated Weaned off propofol in AM, continue fentanyl  Patient is awake and alert in PM, respond to question Update son and son's girlfriend at bedside   [place] 1/19 - Septic shock in ED, Intubated on mechanical ventilation  1/19 - Underwent TURP for prostate abscess 1/19 - Transferred to ICU 1/20 - Weaned off propofol in AM, remains intubated. Currently awake and alert 1/21 Passed SBT, extubated  Results for orders placed or performed during the hospital encounter of 11/26/18  Culture, blood (routine x 2)     Status: Abnormal (Preliminary result)   Collection Time: 11/26/18  3:45 AM  Result Value Ref Range Status   Specimen Description   Final    BLOOD LEFT HAND Performed at [facility], [address], [place], [place] [address]    Special Requests   Final    BOTTLES DRAWN AEROBIC AND ANAEROBIC Blood Culture results may not be optimal due to an excessive volume of blood received in culture bottles Performed at [facility], [address] [place], [place] [address]    Culture  Setup Time   Final    IN BOTH AEROBIC AND ANAEROBIC BOTTLES GRAM NEGATIVE RODS RESULT CALLED TO, READ BACK BY AND VERIFIED WITH: RODNEY GRUBB AT 6222 11/26/18.MSS    Culture (A)  Final    ESCHERICHIA COLI SUSCEPTIBILITIES TO FOLLOW Performed at [facility], [place] [address], [place], [place] [address]    Report Status PENDING  Incomplete  Culture, blood (routine x 2)     Status: None (Preliminary result)   Collection Time: 11/26/18  3:45 AM  Result Value Ref Range Status   Specimen Description   Final    BLOOD LEFT ARM Performed at [facility], [address], [place], [place] [address]    Special Requests   Final    BOTTLES DRAWN AEROBIC AND ANAEROBIC Blood Culture results may not be optimal due to an excessive volume of blood received  in culture bottles Performed at [facility], [address], [place], [place] [address]    Culture  Setup Time   Final    GRAM NEGATIVE RODS IN BOTH AEROBIC AND ANAEROBIC BOTTLES CRITICAL VALUE NOTED.  VALUE IS CONSISTENT WITH PREVIOUSLY REPORTED AND CALLED VALUE. GRAM STAIN REVIEWED-AGREE WITH RESULT T. TYSOR    Culture   Final    GRAM NEGATIVE RODS IDENTIFICATION AND SUSCEPTIBILITIES TO FOLLOW Performed at [facility], [place] [address] [place], [place] [address]    Report Status PENDING  Incomplete  Blood Culture ID Panel (Reflexed)     Status: Abnormal   Collection Time: 11/26/18  3:45 AM  Result Value Ref Range Status   Enterococcus species NOT DETECTED NOT DETECTED Final   Listeria monocytogenes NOT DETECTED NOT DETECTED Final   Staphylococcus species NOT DETECTED NOT DETECTED Final   Staphylococcus aureus (BCID) NOT DETECTED NOT DETECTED Final   Streptococcus species NOT DETECTED NOT DETECTED Final   Streptococcus agalactiae NOT DETECTED NOT DETECTED Final   Streptococcus pneumoniae NOT DETECTED NOT DETECTED Final   Streptococcus pyogenes NOT DETECTED NOT DETECTED Final   Acinetobacter baumannii NOT DETECTED NOT DETECTED Final   Enterobacteriaceae species DETECTED (A) NOT DETECTED Final    Comment: Enterobacteriaceae represent a large family of gram-negative bacteria, not a single organism. RESULT CALLED TO, READ BACK BY AND VERIFIED WITH: RODNEY GRUBB AT 8563 11/26/18.  MSS    Enterobacter cloacae complex NOT DETECTED NOT DETECTED Final   Escherichia coli DETECTED (A) NOT DETECTED Final    Comment: RESULT CALLED TO, READ BACK BY AND VERIFIED WITH: RODNEY GRUBB AT 3810 11/26/18. MSS    Klebsiella oxytoca NOT DETECTED NOT DETECTED Final   Klebsiella pneumoniae NOT DETECTED NOT DETECTED Final   Proteus species NOT DETECTED NOT DETECTED Final   Serratia marcescens NOT DETECTED NOT DETECTED Final   Carbapenem resistance NOT DETECTED NOT DETECTED Final    Haemophilus influenzae NOT DETECTED NOT DETECTED Final   Neisseria meningitidis NOT DETECTED NOT DETECTED Final   Pseudomonas aeruginosa NOT DETECTED NOT DETECTED Final   Candida albicans NOT DETECTED NOT DETECTED Final   Candida glabrata NOT DETECTED NOT DETECTED Final   Candida krusei NOT DETECTED NOT DETECTED Final   Candida parapsilosis NOT DETECTED NOT DETECTED Final   Candida tropicalis NOT DETECTED NOT DETECTED Final    Comment: Performed at [facility], [address], [place], [place] [address]  Urine culture     Status: Abnormal (Preliminary result)   Collection Time: 11/26/18  3:52 AM  Result Value Ref Range Status   Specimen Description   Final    URINE, RANDOM Performed at [facility], [address], [place], [place] [address]    Special Requests   Final    NONE Performed at [facility], [address], [place], [place] [address]    Culture (A)  Final    >=100,000 COLONIES/mL ESCHERICHIA COLI 40,000 COLONIES/mL KLEBSIELLA PNEUMONIAE SUSCEPTIBILITIES TO FOLLOW Performed at [place] [address], [place], [place] [address]    Report Status PENDING  Incomplete   Organism ID, Bacteria ESCHERICHIA COLI (A)  Final      Susceptibility   Escherichia coli - MIC*    AMPICILLIN >=32 RESISTANT Resistant     CEFAZOLIN <=4 SENSITIVE Sensitive     CEFTRIAXONE <=1 SENSITIVE Sensitive     CIPROFLOXACIN <=0.25 SENSITIVE Sensitive     GENTAMICIN <=1 SENSITIVE Sensitive     IMIPENEM <=0.25 SENSITIVE Sensitive     NITROFURANTOIN <=16 SENSITIVE Sensitive     TRIMETH/SULFA <=20 SENSITIVE Sensitive     AMPICILLIN/SULBACTAM >=32 RESISTANT Resistant     PIP/TAZO <=4 SENSITIVE Sensitive     Extended ESBL NEGATIVE Sensitive     * >=100,000 COLONIES/mL ESCHERICHIA COLI  MRSA PCR Screening     Status: None   Collection Time: 11/26/18  2:21 PM  Result Value Ref Range Status   MRSA by PCR NEGATIVE NEGATIVE Final    Comment:        The  GeneXpert MRSA Assay (FDA approved for NASAL specimens only), is one component of a comprehensive MRSA colonization surveillance program. It is not intended to diagnose MRSA infection nor to guide or monitor treatment for MRSA infections. Performed at [facility], [address] [place], [place] [address]    Anti-infectives (From admission, onward)   Start     Dose/Rate Route Frequency Ordered Stop   11/28/18 1400  ceFAZolin (ANCEF) IVPB 2g/100 mL premix     2 g 200 mL/hr over 30 Minutes Intravenous Every 8 hours 11/28/18 1054     11/27/18 1400  meropenem (MERREM) 1 g in sodium chloride 0.9 % 100 mL IVPB  Status:  Discontinued     1 g 200 mL/hr over 30 Minutes Intravenous Every 8 hours 11/27/18 1247 11/28/18 1033   11/27/18 0000  vancomycin (VANCOCIN) IVPB 1000 mg/200  mL premix  Status:  Discontinued     1,000 mg 200 mL/hr over 60 Minutes Intravenous Every 24 hours 11/26/18 0942 11/26/18 1728   11/26/18 1800  ceFEPIme (MAXIPIME) 2 g in sodium chloride 0.9 % 100 mL IVPB  Status:  Discontinued     2 g 200 mL/hr over 30 Minutes Intravenous Every 12 hours 11/26/18 0942 11/27/18 1247   11/26/18 1115  ciprofloxacin (CIPRO) IVPB 400 mg  Status:  Discontinued     400 mg 200 mL/hr over 60 Minutes Intravenous Every 12 hours 11/26/18 1108 11/27/18 1350   11/26/18 0345  ceFEPIme (MAXIPIME) 2 g in sodium chloride 0.9 % 100 mL IVPB     2 g 200 mL/hr over 30 Minutes Intravenous  Once 11/26/18 0339 11/26/18 0451   11/26/18 0345  metroNIDAZOLE (FLAGYL) IVPB 500 mg  Status:  Discontinued     500 mg 100 mL/hr over 60 Minutes Intravenous Every 8 hours 11/26/18 0339 11/26/18 1110   11/26/18 0345  vancomycin (VANCOCIN) IVPB 1000 mg/200 mL premix  Status:  Discontinued     1,000 mg 200 mL/hr over 60 Minutes Intravenous  Once 11/26/18 0339 11/26/18 0341   11/26/18 0345  vancomycin (VANCOCIN) 2,000 mg in sodium chloride 0.9 % 500 mL IVPB     2,000 mg 250 mL/hr over 120 Minutes Intravenous   Once 11/26/18 0344 11/26/18 0617        BP 131/67   Pulse 65   Temp 98.8 F (37.1 C) (Rectal)   Resp 17   Ht 5\' 11"  (1.803 m)   Wt 109.8 kg   SpO2 96%   BMI 33.76 kg/m   SUBJ: RASS 0. + F/C. Passed SBT. Extubated. Tolrating early   PHYSICAL EXAMINATION: Gen: WDWN in NAD post extubation HEENT: NCAT, sclerae white, oropharynx normal Neck: No LAN, JVP not visualized Lungs: Sounds full without wheezes anteriorly Cardiovascular: Regular, normal rate, no M noted Abdomen: Soft, NT, +BS Ext: Symmetric carpal and pedal edema Neuro: PERRL, EOMI, motor/sensory grossly intact Skin: No lesions noted    BMP Latest Ref Rng & Units 11/28/2018 11/27/2018 11/26/2018  Glucose 70 - 99 mg/dL 194(H) 290(H) 312(H)  BUN 8 - 23 mg/dL 56(H) 58(H) 57(H)  Creatinine 0.61 - 1.24 mg/dL 0.97 1.11 1.08  BUN/Creat Ratio 10 - 24 - - -  Sodium 135 - 145 mmol/L 147(H) 143 141  Potassium 3.5 - 5.1 mmol/L 3.5 4.0 5.1  Chloride 98 - 111 mmol/L 115(H) 112(H) 115(H)  CO2 22 - 32 mmol/L 28 25 18(L)  Calcium 8.9 - 10.3 mg/dL 8.3(L) 7.6(L) 7.7(L)   CBC Latest Ref Rng & Units 11/28/2018 11/27/2018 11/26/2018  WBC 4.0 - 10.5 K/uL 3.6(L) 2.7(L) 10.9(H)  Hemoglobin 13.0 - 17.0 g/dL 7.9(L) 7.7(L) 9.2(L)  Hematocrit 39.0 - 52.0 % 24.0(L) 24.0(L) 28.5(L)  Platelets 150 - 400 K/uL 32(L) 43(L) 65(L)   CXR: Mild edema pattern   IMPRESSION: Ventilator dependent respiratory failure Severe sepsis/septic shock-resolving E. 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 History of cirrhosis  PLAN/REC: Extubated under my supervision this morning and appears to be tolerating Supplemental oxygen as needed Monitor temp, WBC count Micro and abx as above  Antibiotics narrowed to cefazolin based on sensitivities Monitor BMET intermittently Monitor  I/Os Correct electrolytes as indicated DVT px: SCDs Monitor CBC intermittently Transfuse per usual guidelines Advance diet and activity as able  Patient and family updated at  bedside   CCM time: 35 mins The above time includes time spent in consultation with patient and/or family members and reviewing care plan on multidisciplinary rounds  Merton Border, MD PCCM service Mobile [phone] Pager [phone] 11/28/2018 2:21 PM

## 2018-11-28 NOTE — Evaluation (Addendum)
Clinical/Bedside Swallow Evaluation Patient Details  Name: Jodeci Roarty. MRN: [id number] Date of Birth: 12-02-1940  Today's Date: 11/28/2018 Time: SLP Start Time (ACUTE ONLY): 62 SLP Stop Time (ACUTE ONLY): 1730 SLP Time Calculation (min) (ACUTE ONLY): 60 min  Past Medical History:  Past Medical History:  Diagnosis Date  . Allergy   . Arthritis    "everywhere' - worse in back  . Body mass index 39.0-39.9, adult   . CAD (coronary artery disease)   . Chronic back pain   . Cirrhosis, non-alcoholic ([place])   . Diabetes mellitus without complication ([place])   . Elevated liver enzymes   . GERD (gastroesophageal reflux disease)   . Heart murmur   . Hyperlipidemia   . Hypertension   . Hypogonadism in male   . Melanoma ([place])    melanoma   . Melanoma in situ of right shoulder ([place])   . Myasthenia gravis ([place]) 01/2018  . Renal stones   . Sleep apnea    CPAP  . Uses hearing aid    doesn't wear   Past Surgical History:  Past Surgical History:  Procedure Laterality Date  . ANGIOPLASTY  1999   3 stents  . CARDIAC CATHETERIZATION     stents placed  . CARPAL TUNNEL RELEASE     x2  . CATARACT EXTRACTION, BILATERAL    . COLONOSCOPY WITH PROPOFOL N/A 07/03/2018   Procedure: COLONOSCOPY WITH PROPOFOL;  Surgeon: Lucilla Lame, MD;  Location: [facility];  Service: Endoscopy;  Laterality: N/A;  . ESOPHAGOGASTRODUODENOSCOPY (EGD) WITH PROPOFOL N/A 09/08/2015   Procedure: ESOPHAGOGASTRODUODENOSCOPY (EGD) WITH PROPOFOL;  Surgeon: Lucilla Lame, MD;  Location: [facility];  Service: Endoscopy;  Laterality: N/A;  CPAP Diabetic - oral meds  . ESOPHAGOGASTRODUODENOSCOPY (EGD) WITH PROPOFOL N/A 07/03/2018   Procedure: ESOPHAGOGASTRODUODENOSCOPY (EGD) WITH PROPOFOL;  Surgeon: Lucilla Lame, MD;  Location: [facility];  Service: Endoscopy;  Laterality: N/A;  diabetic - oral meds sleep apnea  . LIVER BIOPSY    . POLYPECTOMY N/A 07/03/2018   Procedure: POLYPECTOMY;  Surgeon:  Lucilla Lame, MD;  Location: [facility];  Service: Endoscopy;  Laterality: N/A;  . SKIN LESION EXCISION  1998  . SKIN LESION EXCISION     skin cancer removed on forehead in June 2018  . TONSILLECTOMY     age 78  . TRANSURETHRAL RESECTION OF PROSTATE N/A 11/26/2018   Procedure: TRANSURETHRAL RESECTION OF THE PROSTATE (TURP);  Surgeon: Irine Seal, MD;  Location: [facility];  Service: Urology;  Laterality: N/A;   HPI:   Pt is a 78 y.o. male with a known history of multiple medical issues including Obesity, Myasthenia Gravis, GERD, cirrhosis - non ETOH, hyperlipidemia, hypertension, melanoma, hypogonadism, sleep apnea presented to the emergency room for fever, shaking chills and abdominal discomfort.  Patient also had some nausea and vomiting.  Patient was altered and confused when he presented to the emergency room.  History of urinary tract infections in the past and recently completed a course of antibiotics that is Bactrim and Keflex.  Patient was evaluated in the emergency room he was found to be septic.  He was given 4 L IV fluids.  Patient was worked up with CT abdomen which showed abscess in the prostate.  Urology consultation was done.  CT scanning of the chest and abdomen also showed pneumonia.  Patient started on broad-spectrum IV antibiotics.  After receiving the fluids secondary to pulmonary edema and respiratory distress he was intubated briefly x2 days; now extubated.  Assessment / Plan / Recommendation Clinical Impression  Pt appears to present w/ adequate oropharyngeal phase swallowing function w/ no immediate, overt s/s of aspiration noted. Pt was recently intubated/extubated (x2 days) and stated he feels his throat is "sensitive" but not sore. He is verbally conversive and follows commands appropriately. Vocal quality low, min gravely but appropriate. He needed support positioning more upright in bed for safer eating/drinking. Pt consumed trials of thin liquids via straw and  purees/soft solids w/ clear vocal quality post trials; no decline in O2 sats (96%) or RR (21). Pt was given time b/t trials in order to slow down to take his time; fully clear his mouth b/t trials. A mild throat clear and light cough was noted x1 each during all trials consumed; no decline in vitals or presentation at the time. Oral phase appeared grossly wfl for bolus management and oral clearing. Pt described his deficits (baseline) from Myasthenia Gravis as slower lingual coordination and strength for A-P transfer when eating "some" solid foods and swallowing Pills. (He indicated he takes his finger and "pushes" the pill back to swallow when it is "stuck".) Pt exhibited appropriate mastication and full oral clearing w/ all trials including soft solids during this exam. Pt swallowed a Pill WHOLE in Puree(applesauce) w/out difficulty. Pt fed self w/ setup assist. OM exam appeared grossly [facility] for strength/ROM/tone.  Recommend a Mech Soft diet w/ thin liquids - monitor straw use. Recommend general aspiration precautions; Pills WHOLE in Puree for easier swallowing baseline. Encouraged pt to eat/drink slowly taking his time w/ increased textured foods d/t the exertion especially if feeling fatigued at any time. ST services will be available for further f/u for education/tx as needed while admitted. NSG to contact ST services if any decline in status while admitted.  SLP Visit Diagnosis: Dysphagia, unspecified (R13.10)    Aspiration Risk  (reduced following precautions)    Diet Recommendation  Mech Soft diet w/ thin liquids; general aspiration precautions; sit fully upright for any oral intake. GERD precautions  Medication Administration: Whole meds with puree(for easier, safer swallowing)    Other  Recommendations Recommended Consults: (n/a) Oral Care Recommendations: Oral care BID;Patient independent with oral care Other Recommendations: (n/a)   Follow up Recommendations None      Frequency and  Duration (n/a)  (n/a)       Prognosis Prognosis for Safe Diet Advancement: Good Barriers to Reach Goals: (baseline Myasthenia Gravis)      Swallow Study   General Date of Onset: 11/26/18 Type of Study: Bedside Swallow Evaluation Previous Swallow Assessment: none reported Diet Prior to this Study: NPO(regular diet at home) Temperature Spikes Noted: No(wbc 3.6) Respiratory Status: Nasal cannula(2 liters) History of Recent Intubation: Yes Length of Intubations (days): 2 days Date extubated: 11/28/18 Behavior/Cognition: Alert;Cooperative;Pleasant mood Oral Cavity Assessment: Within Functional Limits Oral Care Completed by SLP: Recent completion by staff Oral Cavity - Dentition: Adequate natural dentition Vision: Functional for self-feeding Self-Feeding Abilities: Able to feed self;Needs set up Patient Positioning: Upright in bed(needed positioning) Baseline Vocal Quality: Normal Volitional Cough: Strong Volitional Swallow: Able to elicit    Oral/Motor/Sensory Function Overall Oral Motor/Sensory Function: Within functional limits(grossly - pt reports min slower coordination during A-P)   Ice Chips Ice chips: Within functional limits Presentation: Spoon(fed; 6 trials)   Thin Liquid Thin Liquid: Impaired Presentation: Self Fed;Straw(~8 ozs) Oral Phase Impairments: (none) Oral Phase Functional Implications: (none) Pharyngeal  Phase Impairments: Cough - Delayed;Throat Clearing - Delayed(x1 each(mild)) Other Comments: pt stated he uses straws  to drink from baseline "because it's easier for me"    Nectar Thick Nectar Thick Liquid: Not tested   Honey Thick Honey Thick Liquid: Not tested   Puree Puree: Within functional limits Presentation: Spoon;Self Fed(4 ozs) Other Comments: mild throat much delayed post multiple trials   Solid     Solid: Within functional limits(grossly) Presentation: Self Fed;Spoon(4 trials ) Other Comments: he described "it's tougher sometimes when I don't  feel well"       Orinda Kenner, MS, CCC-SLP , 11/28/2018,6:07 PM

## 2018-11-28 NOTE — Progress Notes (Signed)
Pt extubated per MD order, pt tol procedure well. Pt placed on 2Lnc. Will continue to monitor

## 2018-11-28 NOTE — Progress Notes (Addendum)
[place] [facility] [place] NAME: Mason Wilson    MR#:  [id number]  DATE OF BIRTH:  16-Nov-1940  SUBJECTIVE:  For possible extubation later today, currently still on ventilator REVIEW OF SYSTEMS:    ROS Patient is critically ill could not be obtained on ventilator  DRUG ALLERGIES:   Allergies  Allergen Reactions  . Altace [Ramipril] Cough    VITALS:  Blood pressure 125/72, pulse 66, temperature 98.8 F (37.1 C), temperature source Rectal, resp. rate 18, height 5\' 11"  (1.803 m), weight 109.8 kg, SpO2 96 %.  PHYSICAL EXAMINATION:   Physical Exam  GENERAL:  78 y.o.-year-old patient lying in the bed on ventilator EYES: Pupils equal, round, reactive to light and accommodation. No scleral icterus. Extraocular muscles intact.  HEENT: Head atraumatic, normocephalic. Oropharynx ET tube noted NECK:  Supple, no jugular venous distention. No thyroid enlargement, no tenderness.  LUNGS: Decreased breath sounds bilaterally, Rales heard in both lungs CARDIOVASCULAR: S1, S2 normal. No murmurs, rubs, or gallops.  ABDOMEN: Soft, nontender, nondistended. Bowel sounds present. No organomegaly or mass.  EXTREMITIES: No cyanosis, clubbing or edema b/l.    NEUROLOGIC: Could not be assessed completely as patient is on ventilator PSYCHIATRIC: The patient is alert and oriented x none.  SKIN: No obvious rash, lesion, or ulcer.   LABORATORY PANEL:   CBC Recent Labs  Lab 11/28/18 0436  WBC 3.6*  HGB 7.9*  HCT 24.0*  PLT 32*   ------------------------------------------------------------------------------------------------------------------ Chemistries  Recent Labs  Lab 11/26/18 2110  11/28/18 0436  NA 141   < > 147*  K 5.1   < > 3.5  CL 115*   < > 115*  CO2 18*   < > 28  GLUCOSE 312*   < > 194*  BUN 57*   < > 56*  CREATININE 1.08   < > 0.97  CALCIUM 7.7*   < > 8.3*  MG 1.9  --   --   AST 29   < > 18  ALT 74*   < > 52*  ALKPHOS 77   < > 63   BILITOT 3.6*   < > 1.4*   < > = values in this interval not displayed.   ------------------------------------------------------------------------------------------------------------------  Cardiac Enzymes Recent Labs  Lab 11/27/18 0141  TROPONINI 0.29*   ------------------------------------------------------------------------------------------------------------------  RADIOLOGY:  Dg Chest Port 1 View  Result Date: 11/28/2018 CLINICAL DATA:  Respiratory failure. EXAM: PORTABLE CHEST 1 VIEW COMPARISON:  11/26/2018. FINDINGS: Endotracheal tube, NG tube, right IJ line in stable position. Cardiomegaly with bilateral interstitial prominence and left-sided pleural effusions suggesting CHF. Persistent bibasilar atelectasis. No pneumothorax. IMPRESSION: 1.  Lines and tubes in stable position. 2. Cardiomegaly with bilateral interstitial prominence and left-sided pleural effusions consistent CHF. 3.  Persistent bibasilar atelectasis. Electronically Signed   By: Marcello Moores  Register   On: 11/28/2018 05:59   Dg Chest Port 1 View  Result Date: 11/26/2018 CLINICAL DATA:  Hypoxia.  Central catheter placement EXAM: PORTABLE CHEST 1 VIEW COMPARISON:  November 26, 2018 study obtained earlier in the day FINDINGS: Central catheter tip is in the superior vena cava. Endotracheal tube tip is 4.7 cm above the carina. Nasogastric tube tip and side port are in the stomach. No pneumothorax. There is consolidation in the medial left base with small left pleural effusion. There is mild perihilar interstitial pulmonary edema. There is cardiomegaly with mild pulmonary venous hypertension. IMPRESSION: Tube and catheter positions as described without pneumothorax. Pulmonary vascular congestion with perihilar  interstitial edema. New small area consolidation medial left base with small left pleural effusion. Suspect developing pneumonia left base, although alveolar edema could present in this manner. Electronically Signed   By:  Lowella Grip III M.D.   On: 11/26/2018 13:29     ASSESSMENT AND PLAN:   78 year old male patient with history of hypertension, hyperlipidemia, melanoma, hypogonadism currently in the ICU for fever, chills, nausea abdominal discomfort  *Acute septic shock Resolved  Successfully weaned off pressor support IV Ancef, follow-up on outstanding cultures   *Acute sepsis from E. coli bacteremia and Enterobacteriaceae Antibiotics per above  *Acute hypoxic respiratory failure Continue mechanical ventilation with weaning as tolerated-possible extubation later today  *Acute prostatic abscess Status post TURP for prostate abscess Continue IV Ancef, follow-up on outstanding cultures/sensitivities  *Acute E. coli and Klebsiella UTI Initially treated with meropenem, changed to Ancef based on sensitivities  *Acute pneumonia  Continue pneumonia protocol, antibiotics per above   *Acute pancreatitis without necrosis Stable  *Pancytopenia  Suspect due to sepsis  Avoid blood thinner medication  *Chronic Myasthenia gravis Stable Continue oral pyridostigmine Supportive care  Disposition pending clinical course   All the records are reviewed and case discussed with Care Management/Social Worker. Management plans discussed with the patient, family and they are in agreement.  CODE STATUS: Full code  DVT Prophylaxis: SCDs  TOTAL TIME TAKING CARE OF THIS PATIENT: 35 minutes.   POSSIBLE D/C IN 3 to 4 DAYS, DEPENDING ON CLINICAL CONDITION.  Avel Peace Salary M.D on 11/28/2018 at 1:02 PM  Between 7am to 6pm - Pager - [phone]  After 6pm go to [email or web address] - password [email or web address] [facility]  Office  [phone]  CC: Primary care physician; [name], MD  Note: This dictation was prepared with Dragon dictation along with smaller phrase technology. Any transcriptional errors that result from this process are unintentional.

## 2018-11-28 NOTE — Progress Notes (Signed)
Urology Consult Follow Up  Subjective: S/P TURP for prostate abscess 11/26/2018  Condition guarded.  Still intubated - to be extubated this am  WBC count 3.6, up from 2.7  Creatinine 0.97  Blood cultures positive for enterobacteriaceae and e.coli.  Urine culture positive for e.coli and klebsiella pneumoniae.  Sensitivities pending.       Anti-infectives: Anti-infectives (From admission, onward)   Start     Dose/Rate Route Frequency Ordered Stop   11/27/18 1400  meropenem (MERREM) 1 g in sodium chloride 0.9 % 100 mL IVPB     1 g 200 mL/hr over 30 Minutes Intravenous Every 8 hours 11/27/18 1247     11/27/18 0000  vancomycin (VANCOCIN) IVPB 1000 mg/200 mL premix  Status:  Discontinued     1,000 mg 200 mL/hr over 60 Minutes Intravenous Every 24 hours 11/26/18 0942 11/26/18 1728   11/26/18 1800  ceFEPIme (MAXIPIME) 2 g in sodium chloride 0.9 % 100 mL IVPB  Status:  Discontinued     2 g 200 mL/hr over 30 Minutes Intravenous Every 12 hours 11/26/18 0942 11/27/18 1247   11/26/18 1115  ciprofloxacin (CIPRO) IVPB 400 mg  Status:  Discontinued     400 mg 200 mL/hr over 60 Minutes Intravenous Every 12 hours 11/26/18 1108 11/27/18 1350   11/26/18 0345  ceFEPIme (MAXIPIME) 2 g in sodium chloride 0.9 % 100 mL IVPB     2 g 200 mL/hr over 30 Minutes Intravenous  Once 11/26/18 0339 11/26/18 0451   11/26/18 0345  metroNIDAZOLE (FLAGYL) IVPB 500 mg  Status:  Discontinued     500 mg 100 mL/hr over 60 Minutes Intravenous Every 8 hours 11/26/18 0339 11/26/18 1110   11/26/18 0345  vancomycin (VANCOCIN) IVPB 1000 mg/200 mL premix  Status:  Discontinued     1,000 mg 200 mL/hr over 60 Minutes Intravenous  Once 11/26/18 0339 11/26/18 0341   11/26/18 0345  vancomycin (VANCOCIN) 2,000 mg in sodium chloride 0.9 % 500 mL IVPB     2,000 mg 250 mL/hr over 120 Minutes Intravenous  Once 11/26/18 0344 11/26/18 0617      Current Facility-Administered Medications  Medication Dose Route Frequency Provider Last Rate  Last Dose  . 0.9 %  sodium chloride infusion  250 mL Intravenous PRN Pyreddy, Reatha Harps, MD      . acetaminophen (TYLENOL) tablet 650 mg  650 mg Per Tube Q6H PRN Wilhelmina Mcardle, MD       Or  . acetaminophen (TYLENOL) suppository 650 mg  650 mg Rectal Q6H PRN Wilhelmina Mcardle, MD      . chlorhexidine gluconate (MEDLINE KIT) (PERIDEX) 0.12 % solution 15 mL  15 mL Mouth Rinse BID Rosine Door, MD   15 mL at 11/27/18 2019  . DOPamine (INTROPIN) 800 mg in dextrose 5 % 250 mL (3.2 mg/mL) infusion  0-20 mcg/kg/min Intravenous Titrated Wilhelmina Mcardle, MD   Stopped at 11/28/18 0543  . fentaNYL (SUBLIMAZE) injection 25 mcg  25 mcg Intravenous Q1H PRN Wilhelmina Mcardle, MD   25 mcg at 11/28/18 0435  . hydrocortisone sodium succinate (SOLU-CORTEF) 100 MG injection 50 mg  50 mg Intravenous Q8H Wilhelmina Mcardle, MD   50 mg at 11/28/18 0427  . insulin aspart (novoLOG) injection 0-15 Units  0-15 Units Subcutaneous Q4H Tukov-Yual, Magdalene S, NP   3 Units at 11/28/18 0416  . insulin glargine (LANTUS) injection 20 Units  20 Units Subcutaneous Daily Wilhelmina Mcardle, MD   20 Units at 11/27/18 1005  .  lactated ringers infusion   Intravenous Continuous Wilhelmina Mcardle, MD 10 mL/hr at 11/28/18 0700    . MEDLINE mouth rinse  15 mL Mouth Rinse 10 times per day Rosine Door, MD   15 mL at 11/28/18 0511  . meropenem (MERREM) 1 g in sodium chloride 0.9 % 100 mL IVPB  1 g Intravenous Q8H Wilhelmina Mcardle, MD   Stopped at 11/28/18 [phone]  . ondansetron (ZOFRAN) tablet 4 mg  4 mg Oral Q6H PRN Pyreddy, Reatha Harps, MD       Or  . ondansetron (ZOFRAN) injection 4 mg  4 mg Intravenous Q6H PRN Pyreddy, Pavan, MD      . propofol (DIPRIVAN) 1000 MG/100ML infusion  0-50 mcg/kg/min Intravenous Continuous Wilhelmina Mcardle, MD 6.26 mL/hr at 11/28/18 0700 10 mcg/kg/min at 11/28/18 0700  . pyridostigmine (MESTINON) tablet 60 mg  60 mg Per Tube Q6H Wilhelmina Mcardle, MD   60 mg at 11/28/18 0605  . sodium chloride flush (NS) 0.9 % injection 3  mL  3 mL Intravenous Q12H Saundra Shelling, MD   3 mL at 11/27/18 2157  . sodium chloride flush (NS) 0.9 % injection 3 mL  3 mL Intravenous PRN Pyreddy, Reatha Harps, MD         Objective: Vital signs in last 24 hours: Temp:  [97.9 F (36.6 C)-99.3 F (37.4 C)] 98.8 F (37.1 C) (01/21 0400) Pulse Rate:  [55-80] 63 (01/21 0700) Resp:  [10-20] 13 (01/21 0700) BP: (99-164)/(49-84) 111/59 (01/21 0700) SpO2:  [90 %-100 %] 98 % (01/21 0726) FiO2 (%):  [24 %] 24 % (01/21 0726)  Intake/Output from previous day: 01/20 0701 - 01/21 0700 In: 1182.5 [I.V.:879.8; IV Piggyback:302.7] Out: 1500 [Urine:1500] Intake/Output this shift: No intake/output data recorded.   Physical Exam  Constitutional:  Currently being in process of being extubated.  Able to answer questions.   Respiratory: Still intubated.   GU:  Patient with uncircumcised phallus.  Foreskin easily retracted.  Foley in place draining pink tinged urine.  Penile discharge of scant blood and mucus.  No penile lesions or rashes. Scrotum without lesions, cysts, rashes and/or edema.   Psychiatric: Normal mood and affect.    Lab Results:  Recent Labs    11/27/18 0428 11/28/18 0436  WBC 2.7* 3.6*  HGB 7.7* 7.9*  HCT 24.0* 24.0*  PLT 43* 32*   BMET Recent Labs    11/27/18 0428 11/28/18 0436  NA 143 147*  K 4.0 3.5  CL 112* 115*  CO2 25 28  GLUCOSE 290* 194*  BUN 58* 56*  CREATININE 1.11 0.97  CALCIUM 7.6* 8.3*   PT/INR Recent Labs    11/26/18 2110 11/27/18 0428  LABPROT 17.0* 16.3*  INR 1.40 1.33   ABG Recent Labs    11/26/18 0645 11/26/18 2135  PHART 7.30* 7.30*  HCO3 19.2* 20.7    Studies/Results: Dg Chest Port 1 View  Result Date: 11/28/2018 CLINICAL DATA:  Respiratory failure. EXAM: PORTABLE CHEST 1 VIEW COMPARISON:  11/26/2018. FINDINGS: Endotracheal tube, NG tube, right IJ line in stable position. Cardiomegaly with bilateral interstitial prominence and left-sided pleural effusions suggesting CHF.  Persistent bibasilar atelectasis. No pneumothorax. IMPRESSION: 1.  Lines and tubes in stable position. 2. Cardiomegaly with bilateral interstitial prominence and left-sided pleural effusions consistent CHF. 3.  Persistent bibasilar atelectasis. Electronically Signed   By: Marcello Moores  Register   On: 11/28/2018 05:59   Dg Chest Port 1 View  Result Date: 11/26/2018 CLINICAL DATA:  Hypoxia.  Central catheter placement  EXAM: PORTABLE CHEST 1 VIEW COMPARISON:  November 26, 2018 study obtained earlier in the day FINDINGS: Central catheter tip is in the superior vena cava. Endotracheal tube tip is 4.7 cm above the carina. Nasogastric tube tip and side port are in the stomach. No pneumothorax. There is consolidation in the medial left base with small left pleural effusion. There is mild perihilar interstitial pulmonary edema. There is cardiomegaly with mild pulmonary venous hypertension. IMPRESSION: Tube and catheter positions as described without pneumothorax. Pulmonary vascular congestion with perihilar interstitial edema. New small area consolidation medial left base with small left pleural effusion. Suspect developing pneumonia left base, although alveolar edema could present in this manner. Electronically Signed   By: Lowella Grip III M.D.   On: 11/26/2018 13:29     Assessment and Plan 78 year old male with septic shock related to prostate abscess who is status post TURP on 11/26/2018  - condition guarded, still intubated - in process of being extubated this am  - continue Foley   - continue meropenem -blood cultures + for enterobacteriaceae and e.coli - urine culture + e.coli and klebsiella pneumoniae  - will need long term antibiotics (6-8 weeks) after discharge - awaiting sensitivities (no fluoroquinolones as they are not recommended with myasthenia gravis   - continue to follow     LOS: 2 days    [facility] 11/28/2018

## 2018-11-28 NOTE — Progress Notes (Signed)
Patient holding chest. Noded yes to chest pain. Held his hands stating 10 our of 10 pain. Per Md EKG ordered, no additional orders. Will come to unit to evaluate patient.

## 2018-11-28 NOTE — Progress Notes (Signed)
F/u with pt that the ch encountered while in ER. Pt was responsive but very tired. Pt family was present and the ch was able to engage with them. Pt and family seemed to be worried about his condition even th0ough he has made significant improvement. F/u recommended.     11/28/18 1200  Clinical Encounter Type  Visited With Patient and family together  Visit Type Follow-up;Spiritual support;Social support;Critical Care  Spiritual Encounters  Spiritual Needs Grief support;Emotional  Stress Factors  Patient Stress Factors Exhausted;Health changes;Loss;Loss of control;Major life changes  Family Stress Factors Major life changes;Loss of control;Loss

## 2018-11-28 NOTE — Care Management Note (Signed)
Case Management Note  Patient Details  Name: Mason Wilson. MRN: [id number] Date of Birth: Dec 07, 1940  Subjective/Objective:   Patient admitted with septic shock related to prostate abscess.  Patient is s/p turp.  Patient was extubated this morning and is awake and alert.  Wife, Mason Wilson is at the bedside.  Patient is from home with wife and she reports that he is independent in all ADL's and drives at baseline.  PCP is Dr. Jeananne Rama who he sees twice per year.  Patient also has a urologist that he sees once per year.  Pharmacy is [facility].  PT consult is pending, patient and family are okay with home health services being set up at discharge.  CMS [place] agency list present to wife and patient for choice.  RNCM will follow up for agency choice and PT recommendations.  Doran Clay RN BSN [phone]                Action/Plan:   Expected Discharge Date:                  Expected Discharge Plan:  [place]  In-House Referral:     Discharge planning Services  CM Consult  Post Acute Care Choice:    Choice offered to:     DME Arranged:    DME Agency:     HH Arranged:    [place] Agency:     Status of Service:  In process, will continue to follow  If discussed at Long Length of Stay Meetings, dates discussed:    Additional Comments:  Shelbie Hutching, RN 11/28/2018, 11:20 AM

## 2018-11-29 ENCOUNTER — Other Ambulatory Visit: Payer: Self-pay

## 2018-11-29 DIAGNOSIS — R6520 Severe sepsis without septic shock: Secondary | ICD-10-CM

## 2018-11-29 DIAGNOSIS — A419 Sepsis, unspecified organism: Secondary | ICD-10-CM

## 2018-11-29 DIAGNOSIS — N390 Urinary tract infection, site not specified: Secondary | ICD-10-CM

## 2018-11-29 LAB — CULTURE, BLOOD (ROUTINE X 2)

## 2018-11-29 LAB — CBC
HEMATOCRIT: 24.8 % — AB (ref 39.0–52.0)
Hemoglobin: 8 g/dL — ABNORMAL LOW (ref 13.0–17.0)
MCH: 30.3 pg (ref 26.0–34.0)
MCHC: 32.3 g/dL (ref 30.0–36.0)
MCV: 93.9 fL (ref 80.0–100.0)
Platelets: 36 10*3/uL — ABNORMAL LOW (ref 150–400)
RBC: 2.64 MIL/uL — AB (ref 4.22–5.81)
RDW: 15 % (ref 11.5–15.5)
WBC: 4.9 10*3/uL (ref 4.0–10.5)
nRBC: 0.4 % — ABNORMAL HIGH (ref 0.0–0.2)

## 2018-11-29 LAB — BASIC METABOLIC PANEL
Anion gap: 4 — ABNORMAL LOW (ref 5–15)
BUN: 50 mg/dL — ABNORMAL HIGH (ref 8–23)
CHLORIDE: 111 mmol/L (ref 98–111)
CO2: 29 mmol/L (ref 22–32)
Calcium: 8.8 mg/dL — ABNORMAL LOW (ref 8.9–10.3)
Creatinine, Ser: 1.03 mg/dL (ref 0.61–1.24)
GFR calc Af Amer: >60 mL/min (ref >60)
GFR calc non Af Amer: >60 mL/min (ref >60)
Glucose, Bld: 227 mg/dL — ABNORMAL HIGH (ref 70–99)
Potassium: 3.5 mmol/L (ref 3.5–5.1)
Sodium: 144 mmol/L (ref 135–145)

## 2018-11-29 LAB — URINE CULTURE: Culture: >=100000 — AB

## 2018-11-29 LAB — GLUCOSE, CAPILLARY
Glucose-Capillary: 220 mg/dL — ABNORMAL HIGH (ref 70–99)
Glucose-Capillary: 236 mg/dL — ABNORMAL HIGH (ref 70–99)
Glucose-Capillary: 183 mg/dL — ABNORMAL HIGH (ref 70–99)
Glucose-Capillary: 224 mg/dL — ABNORMAL HIGH (ref 70–99)
Glucose-Capillary: 243 mg/dL — ABNORMAL HIGH (ref 70–99)

## 2018-11-29 MED ORDER — INSULIN ASPART 100 UNIT/ML ~~LOC~~ SOLN
0.0000 [IU] | Freq: Three times a day (TID) | SUBCUTANEOUS | Status: DC
  Administered 2018-11-29: 5 [IU] via SUBCUTANEOUS
  Administered 2018-11-29: 3 [IU] via SUBCUTANEOUS
  Administered 2018-11-30 (×2): 8 [IU] via SUBCUTANEOUS
  Filled 2018-11-29 (×4): qty 1

## 2018-11-29 MED ORDER — INSULIN ASPART 100 UNIT/ML ~~LOC~~ SOLN
0.0000 [IU] | Freq: Every day | SUBCUTANEOUS | Status: DC
  Administered 2018-11-29: 2 [IU] via SUBCUTANEOUS
  Filled 2018-11-29: qty 1

## 2018-11-29 MED ORDER — ENSURE MAX PROTEIN PO LIQD
11.0000 [oz_av] | Freq: Two times a day (BID) | ORAL | Status: DC
  Administered 2018-11-30: 11 [oz_av] via ORAL
  Filled 2018-11-29: qty 330

## 2018-11-29 MED ORDER — ADULT MULTIVITAMIN W/MINERALS CH
1.0000 | ORAL_TABLET | Freq: Every day | ORAL | Status: DC
  Administered 2018-11-30: 1 via ORAL
  Filled 2018-11-29: qty 1

## 2018-11-29 NOTE — Progress Notes (Signed)
Urology Consult Follow Up  Subjective: S/P TURP for prostate abscess 11/26/2018  Eating breakfast of eggs and bacon.  PT to assess today.  WBC count 4.9, up from 3.6  Creatinine 1.03.   Blood cultures positive for enterobacteriaceae and e.coli.  Urine culture positive for e.coli and klebsiella pneumoniae.  Good urine output - pink tinged.    Anti-infectives: Anti-infectives (From admission, onward)   Start     Dose/Rate Route Frequency Ordered Stop   11/28/18 1400  ceFAZolin (ANCEF) IVPB 2g/100 mL premix     2 g 200 mL/hr over 30 Minutes Intravenous Every 8 hours 11/28/18 1054     11/27/18 1400  meropenem (MERREM) 1 g in sodium chloride 0.9 % 100 mL IVPB  Status:  Discontinued     1 g 200 mL/hr over 30 Minutes Intravenous Every 8 hours 11/27/18 1247 11/28/18 1033   11/27/18 0000  vancomycin (VANCOCIN) IVPB 1000 mg/200 mL premix  Status:  Discontinued     1,000 mg 200 mL/hr over 60 Minutes Intravenous Every 24 hours 11/26/18 0942 11/26/18 1728   11/26/18 1800  ceFEPIme (MAXIPIME) 2 g in sodium chloride 0.9 % 100 mL IVPB  Status:  Discontinued     2 g 200 mL/hr over 30 Minutes Intravenous Every 12 hours 11/26/18 0942 11/27/18 1247   11/26/18 1115  ciprofloxacin (CIPRO) IVPB 400 mg  Status:  Discontinued     400 mg 200 mL/hr over 60 Minutes Intravenous Every 12 hours 11/26/18 1108 11/27/18 1350   11/26/18 0345  ceFEPIme (MAXIPIME) 2 g in sodium chloride 0.9 % 100 mL IVPB     2 g 200 mL/hr over 30 Minutes Intravenous  Once 11/26/18 0339 11/26/18 0451   11/26/18 0345  metroNIDAZOLE (FLAGYL) IVPB 500 mg  Status:  Discontinued     500 mg 100 mL/hr over 60 Minutes Intravenous Every 8 hours 11/26/18 0339 11/26/18 1110   11/26/18 0345  vancomycin (VANCOCIN) IVPB 1000 mg/200 mL premix  Status:  Discontinued     1,000 mg 200 mL/hr over 60 Minutes Intravenous  Once 11/26/18 0339 11/26/18 0341   11/26/18 0345  vancomycin (VANCOCIN) 2,000 mg in sodium chloride 0.9 % 500 mL IVPB     2,000 mg 250  mL/hr over 120 Minutes Intravenous  Once 11/26/18 0344 11/26/18 0617      Current Facility-Administered Medications  Medication Dose Route Frequency Provider Last Rate Last Dose  . 0.9 %  sodium chloride infusion  250 mL Intravenous PRN Pyreddy, Reatha Harps, MD      . acetaminophen (TYLENOL) tablet 650 mg  650 mg Per Tube Q6H PRN Wilhelmina Mcardle, MD       Or  . acetaminophen (TYLENOL) suppository 650 mg  650 mg Rectal Q6H PRN Wilhelmina Mcardle, MD      . ceFAZolin (ANCEF) IVPB 2g/100 mL premix  2 g Intravenous Q8H Charlett Nose, [facility]   Stopped at 11/29/18 0531  . chlorhexidine (PERIDEX) 0.12 % solution 15 mL  15 mL Mouth Rinse BID Salary, Montell D, MD   15 mL at 11/29/18 0743  . dextrose 5 % 1,000 mL with potassium chloride 40 mEq infusion   Intravenous Continuous Wilhelmina Mcardle, MD 50 mL/hr at 11/29/18 0700    . hydrocortisone sodium succinate (SOLU-CORTEF) 100 MG injection 25 mg  25 mg Intravenous Q12H Wilhelmina Mcardle, MD   25 mg at 11/28/18 2241  . insulin aspart (novoLOG) injection 0-15 Units  0-15 Units Subcutaneous Q4H Tukov-Yual, Arlyss Gandy, NP  5 Units at 11/29/18 0743  . insulin glargine (LANTUS) injection 10 Units  10 Units Subcutaneous Daily Wilhelmina Mcardle, MD   10 Units at 11/28/18 1051  . lidocaine (XYLOCAINE) 2 % viscous mouth solution 15 mL  15 mL Mouth/Throat Q3H PRN Wilhelmina Mcardle, MD      . MEDLINE mouth rinse  15 mL Mouth Rinse q12n4p Salary, Montell D, MD      . ondansetron (ZOFRAN) injection 4 mg  4 mg Intravenous Q6H PRN Pyreddy, Pavan, MD      . pyridostigmine (MESTINON) tablet 60 mg  60 mg Oral Q6H Wilhelmina Mcardle, MD   60 mg at 11/29/18 0533  . sodium chloride flush (NS) 0.9 % injection 3 mL  3 mL Intravenous Q12H Saundra Shelling, MD   3 mL at 11/29/18 0748  . sodium chloride flush (NS) 0.9 % injection 3 mL  3 mL Intravenous PRN Pyreddy, Reatha Harps, MD         Objective: Vital signs in last 24 hours: Temp:  [97.4 F (36.3 C)-98.6 F (37 C)] 97.5 F (36.4 C)  (01/22 0437) Pulse Rate:  [65-102] 66 (01/22 0700) Resp:  [16-30] 21 (01/22 0700) BP: (118-166)/(56-90) 131/60 (01/22 0700) SpO2:  [95 %-100 %] 96 % (01/22 0700) FiO2 (%):  [28 %] 28 % (01/22 0000)  Intake/Output from previous day: 01/21 0701 - 01/22 0700 In: 1288.9 [I.V.:1088.9; IV Piggyback:200] Out: 2000 [Urine:2000] Intake/Output this shift: No intake/output data recorded.   Physical Exam  Constitutional:  Well nourished. Alert and oriented, No acute distress. HEENT: [place] AT, moist mucus membranes.  Trachea midline, no masses. Cardiovascular: No clubbing, cyanosis with mild peripheral edema in lower extremities and hands.   Respiratory: Normal respiratory effort, no increased work of breathing. GI: Abdomen is soft, non tender, non distended, no abdominal masses.   GU: No CVA tenderness.  No bladder fullness or masses.  Patient with uncircumcised phallus.  Foreskin edematous.   Scrotum with mild edema.   Skin: Several bruises on both arms.   Neurologic: Grossly intact, no focal deficits, moving all 4 extremities. Psychiatric: Normal mood and affect.    Lab Results:  Recent Labs    11/28/18 0436 11/29/18 0359  WBC 3.6* 4.9  HGB 7.9* 8.0*  HCT 24.0* 24.8*  PLT 32* 36*   BMET Recent Labs    11/28/18 0436 11/29/18 0359  NA 147* 144  K 3.5 3.5  CL 115* 111  CO2 28 29  GLUCOSE 194* 227*  BUN 56* 50*  CREATININE 0.97 1.03  CALCIUM 8.3* 8.8*   PT/INR Recent Labs    11/26/18 2110 11/27/18 0428  LABPROT 17.0* 16.3*  INR 1.40 1.33   ABG Recent Labs    11/26/18 2135  PHART 7.30*  HCO3 20.7    Studies/Results: Dg Chest Port 1 View  Result Date: 11/28/2018 CLINICAL DATA:  Respiratory failure. EXAM: PORTABLE CHEST 1 VIEW COMPARISON:  11/26/2018. FINDINGS: Endotracheal tube, NG tube, right IJ line in stable position. Cardiomegaly with bilateral interstitial prominence and left-sided pleural effusions suggesting CHF. Persistent bibasilar atelectasis. No  pneumothorax. IMPRESSION: 1.  Lines and tubes in stable position. 2. Cardiomegaly with bilateral interstitial prominence and left-sided pleural effusions consistent CHF. 3.  Persistent bibasilar atelectasis. Electronically Signed   By: Marcello Moores  Register   On: 11/28/2018 05:59     Assessment and Plan 78 year old male with septic shock related to prostate abscess who is status post TURP on 11/26/2018  - Foley has been discontinued  without consulting urology as it would of been recommended to continue Foley to aid in the emptying of the bladder as to prevent infected urine being trapped in the cavernous areas left by the draining of the abscesses - would NOT replace the Foley at this time as we would want to avoid further manipulation   - continue cefazolin   - will need long term antibiotics (6-8 weeks) after discharge - NO fluoroquinolones as they are not recommended with myasthenia gravis   - continue to follow     LOS: 3 days    [facility] 11/29/2018

## 2018-11-29 NOTE — Progress Notes (Signed)
Report called to [facility] on 2C, pt transported on 2C bed, VSS on room air, his wife took his belongings, including his CPAP machine, with her to room 224.  Pt and spouse agreeable to transfer

## 2018-11-29 NOTE — Progress Notes (Signed)
Initial Nutrition Assessment  DOCUMENTATION CODES:   Obesity unspecified  INTERVENTION:  Provide Ensure Max protein supplement po BID, each supplement provides 150kcal and 30g of protein. Patient enjoys chocolate and vanilla.  Provide daily MVI.  Encouraged adequate intake of protein at meals and snacks.  NUTRITION DIAGNOSIS:   Inadequate oral intake related to decreased appetite as evidenced by per patient/family report.  GOAL:   Patient will meet greater than or equal to 90% of their needs  MONITOR:   PO intake, Supplement acceptance, Diet advancement, Labs, Weight trends, I & O's  REASON FOR ASSESSMENT:   Malnutrition Screening Tool    ASSESSMENT:   78 year old male with PMHx of HLD, CAD, DM, chronic back pain, melanoma, GERD, arthritis, HTN, sleep apnea, non-alcoholic cirrhosis, myasthenia gravis (diagnosed 01/2018) admitted with acute prostatic abscess s/p TURP, septic shock, UTI, PNA, acute pancreatitis without necrosis, respiratory distress requiring mechanical ventilation 1/19-1/21.   -Following SLP evaluation on 1/21 diet was advanced to dysphagia 3 with thin liquids.  Met with patient and his wife at bedside. He reports his appetite has been decreasing for a few years now. He is eating less often during the day and eating less at meals now. He now only eats 2 meals per day. For breakfast he has eggs and grits. Dinner is usually a meat with sides. He reports he has not been eating well here. At lunch he had some bites of pot roast, but finished his ice cream and drinks. The only source of protein on the dinner he ordered is chocolate milk, which is only 8 grams of protein. After discussing importance of adequate intake, patient is amenable to trying an ONS to help meet his calorie/protein needs.  Patient reports his UBW was around 400 lbs (181.8 kg). He reports he has slowly lost weight over approximately 2-3 years.   Medications reviewed and include: Novolog 0-15  units TID, Novolog 0-5 units QHS, Lantus 10 units daily, cefazolin, D5W with potassium chloride 40 mEq at 50 mL/hr.  Labs reviewed: CBG 183-236, BUN 50.  Patient does not meet criteria for malnutrition at this time.  Discussed with RN and on rounds.  NUTRITION - FOCUSED PHYSICAL EXAM:    Most Recent Value  Orbital Region  No depletion  Upper Arm Region  Mild depletion  Thoracic and Lumbar Region  No depletion  Buccal Region  No depletion  Temple Region  No depletion  Clavicle Bone Region  No depletion  Clavicle and Acromion Bone Region  No depletion  Scapular Bone Region  No depletion  Dorsal Hand  No depletion  Patellar Region  No depletion  Anterior Thigh Region  No depletion  Posterior Calf Region  No depletion  Edema (RD Assessment)  Mild  Hair  Reviewed  Eyes  Reviewed  Mouth  Reviewed  Skin  Reviewed  Nails  Reviewed     Diet Order:   Diet Order            DIET DYS 3 Room service appropriate? Yes with Assist; Fluid consistency: Thin  Diet effective now             EDUCATION NEEDS:   Education needs have been addressed  Skin:  Skin Assessment: Reviewed RN Assessment  Last BM:  11/29/2018 - large type 5  Height:   Ht Readings from Last 1 Encounters:  11/26/18 _0  (1.803 m)   Weight:   Wt Readings from Last 1 Encounters:  11/26/18 109.8 kg   Ideal  Body Weight:  78.2 kg  BMI:  Body mass index is 33.76 kg/m.  Estimated Nutritional Needs:   Kcal:  2000-2200  Protein:  100-110 grams  Fluid:  2-2.2 L/day  Willey Blade, MS, RD, LDN Office: [phone] Pager: [phone] After Hours/Weekend Pager: [phone]

## 2018-11-29 NOTE — Progress Notes (Signed)
[facility] NAME: Mason Wilson    MR#:  [id number]  DATE OF BIRTH:  1941/10/16  SUBJECTIVE:  Status post extubation, urology input appreciated  REVIEW OF SYSTEMS:    ROS Patient is critically ill could not be obtained on ventilator  DRUG ALLERGIES:   Allergies  Allergen Reactions  . Altace [Ramipril] Cough    VITALS:  Blood pressure 123/65, pulse 78, temperature 98 F (36.7 C), temperature source Axillary, resp. rate (!) 23, height 5\' 11"  (1.803 m), weight 109.8 kg, SpO2 97 %.  PHYSICAL EXAMINATION:   Physical Exam  GENERAL:  78 y.o.-year-old patient lying in the bed on ventilator EYES: Pupils equal, round, reactive to light and accommodation. No scleral icterus. Extraocular muscles intact.  HEENT: Head atraumatic, normocephalic. Oropharynx ET tube noted NECK:  Supple, no jugular venous distention. No thyroid enlargement, no tenderness.  LUNGS: Decreased breath sounds bilaterally, Rales heard in both lungs CARDIOVASCULAR: S1, S2 normal. No murmurs, rubs, or gallops.  ABDOMEN: Soft, nontender, nondistended. Bowel sounds present. No organomegaly or mass.  EXTREMITIES: No cyanosis, clubbing or edema b/l.    NEUROLOGIC: Could not be assessed completely as patient is on ventilator PSYCHIATRIC: The patient is alert and oriented x none.  SKIN: No obvious rash, lesion, or ulcer.   LABORATORY PANEL:   CBC Recent Labs  Lab 11/29/18 0359  WBC 4.9  HGB 8.0*  HCT 24.8*  PLT 36*   ------------------------------------------------------------------------------------------------------------------ Chemistries  Recent Labs  Lab 11/26/18 2110  11/28/18 0436 11/29/18 0359  NA 141   < > 147* 144  K 5.1   < > 3.5 3.5  CL 115*   < > 115* 111  CO2 18*   < > 28 29  GLUCOSE 312*   < > 194* 227*  BUN 57*   < > 56* 50*  CREATININE 1.08   < > 0.97 1.03  CALCIUM 7.7*   < > 8.3* 8.8*  MG 1.9  --   --   --   AST 29   < > 18  --    ALT 74*   < > 52*  --   ALKPHOS 77   < > 63  --   BILITOT 3.6*   < > 1.4*  --    < > = values in this interval not displayed.   ------------------------------------------------------------------------------------------------------------------  Cardiac Enzymes Recent Labs  Lab 11/27/18 0141  TROPONINI 0.29*   ------------------------------------------------------------------------------------------------------------------  RADIOLOGY:  Dg Chest Port 1 View  Result Date: 11/28/2018 CLINICAL DATA:  Respiratory failure. EXAM: PORTABLE CHEST 1 VIEW COMPARISON:  11/26/2018. FINDINGS: Endotracheal tube, NG tube, right IJ line in stable position. Cardiomegaly with bilateral interstitial prominence and left-sided pleural effusions suggesting CHF. Persistent bibasilar atelectasis. No pneumothorax. IMPRESSION: 1.  Lines and tubes in stable position. 2. Cardiomegaly with bilateral interstitial prominence and left-sided pleural effusions consistent CHF. 3.  Persistent bibasilar atelectasis. Electronically Signed   By: Marcello Moores  Register   On: 11/28/2018 05:59     ASSESSMENT AND PLAN:  78 year old male patient with history of hypertension, hyperlipidemia, melanoma, hypogonadism currently in the ICU for fever, chills, nausea abdominal discomfort  *Acute septic shock Resolving Successfully weaned off pressor support, continue IV Ancef   *Acute sepsis from E. coli bacteremia and Enterobacteriaceae Antibiotics per above, sensitivities noted  *Acute hypoxic respiratory failure Resolving  status post extubation  *Acute prostatic abscess Resolving S/p TURP  Continue IV Ancef Urology input appreciated  *Acute E. coli  and Klebsiella UTI Initially treated with meropenem, changed to Ancef based on sensitivities  *Acute pneumonia  Resolving Continue pneumonia protocol, antibiotics per above   *Acute pancreatitis without necrosis Stable  *Pancytopenia  Suspect due to sepsis  Avoid blood  thinner medication  *Chronic Myasthenia gravis Stable Continue oral pyridostigmine Supportive care  Disposition pending clinical course   All the records are reviewed and case discussed with Care Management/Social Worker. Management plans discussed with the patient, family and they are in agreement.  CODE STATUS: Full code  DVT Prophylaxis: SCDs  TOTAL TIME TAKING CARE OF THIS PATIENT: 35 minutes.   POSSIBLE D/C IN 3 to 4 DAYS, DEPENDING ON CLINICAL CONDITION.  Avel Peace  M.D on 11/29/2018 at 1:52 PM  Between 7am to 6pm - Pager - [phone]  After 6pm go to [email or web address] - password [email or web address] [facility]  Office  [phone]  CC: Primary care physician; [name], MD  Note: This dictation was prepared with Dragon dictation along with smaller phrase technology. Any transcriptional errors that result from this process are unintentional.

## 2018-11-29 NOTE — Progress Notes (Signed)
Pharmacy Antibiotic Note  Mason Wilson. is a 78 y.o. male admitted on 11/26/2018 with bacteremia secondary to UTI. Presented to ED with SOB, N/D, chills, vomiting, and AMS upon arrival. 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 Has a prostatic abscess also being treated. Pharmacy has been consulted for cefazolin dosing.  Plan: Continue cefazolin 2g IV q8h for bacteremia. Monitor renal function, hepatic function, and cbc.    Height: 5\' 11"  (180.3 cm) Weight: 242 lb 1 oz (109.8 kg) IBW/kg (Calculated) : 75.3  Temp (24hrs), Avg:98 F (36.7 C), Min:97.4 F (36.3 C), Max:98.6 F (37 C)  Recent Labs  Lab 11/26/18 0343 11/26/18 0345 11/26/18 0603 11/26/18 1412 11/26/18 2110 11/27/18 0428 11/28/18 0436 11/29/18 0359  WBC 0.8*  --   --   --  10.9* 2.7* 3.6* 4.9  CREATININE 1.60*  --   --  1.31* 1.08 1.11 0.97 1.03  LATICACIDVEN  --  5.8* 2.8*  --  1.7  --   --   --     Estimated Creatinine Clearance: 76.9 mL/min (by C-G formula based on SCr of 1.03 mg/dL).    Allergies  Allergen Reactions  . Altace [Ramipril] Cough    Antimicrobials this admission: 1/21 cefazolin >> 1/20 Meropenem >> 1/21 1/19 ciprofloxacin >> 1/20 1/19 cefepime >> 1/20  Microbiology results: 1/22 BCx: E. Coli. Sensitive to cefazolin. 1/19 UCx: >100,000 E.Coli and 40,000 K. Pneumoniae. Sensitive to cefazolin. 1/19 MRSA PCR: negative  Thank you for allowing pharmacy to be a part of this patient's care.  Berneda Piccininni Marcellina Millin Raffi 11/29/2018 1:18 PM

## 2018-11-29 NOTE — Evaluation (Signed)
Physical Therapy Evaluation Patient Details Name: Mason Wilson. MRN: [id number] DOB: August 08, 1941 Today's Date: 11/29/2018   History of Present Illness  Pt is a 78 y.o. male with a known history of hyperlipidemia, hypertension, melanoma, hypogonadism, sleep apnea presented to the emergency room for fever, shaking chills and abdominal discomfort.  Patient also had some nausea and vomiting.  Patient was altered and confused when he presented to the emergency room.  Patient was evaluated in the emergency room he was found to be septic.  Patient was worked up with CT abdomen which showed abscess in the prostate.  Urology consultation was done.  CT scanning of the chest and abdomen also showed pneumonia.  Patient started on broad-spectrum IV antibiotics.  After receiving the fluids secondary to pulmonary edema and respiratory distress he was intubated and put on ventilator.  Patient was started on IV milrinone drip.  Pt s/p cystoscopy with transurethral incision and drainage of prostatic abscess.  MD assessment includes: Ventilator dependent respiratory failure now extubated, severe sepsis/septic shock, prostate abscess s/p I&D, PNA, AKI, mild hypernatremia, ICU acquired anemia, severe thrombocytopenia, and h/o cirrhosis.    Clinical Impression  Pt presents with deficits in strength, transfers, mobility, gait, balance, and activity tolerance.  Pt required extensive effort with bed mobility tasks but ultimately was able to go from sup to/from sit with the use of a rail without physical assistance.  Pt required assistance for stability and for proper sequencing during sit to/from stand transfers from an elevated surface and while attempting to take small steps at the EOB experienced BLE buckling requiring min-mod A for stability.  Overall pt presents with significant functional deficits compared to baseline and would not be safe to return to his prior living situation at this time.  Pt will benefit from PT  services in a SNF setting upon discharge to safely address above deficits for decreased caregiver assistance and eventual return to PLOF.      Follow Up Recommendations SNF;Supervision for mobility/OOB    Equipment Recommendations  Other (comment);Rolling walker with 5" wheels;3in1 (PT)(TBD at next venue of care upon discharge to SNF)    Recommendations for Other Services       Precautions / Restrictions Precautions Precautions: Fall Restrictions Weight Bearing Restrictions: No      Mobility  Bed Mobility Overal bed mobility: Needs Assistance Bed Mobility: Supine to Sit;Sit to Supine     Supine to sit: Supervision Sit to supine: Supervision   General bed mobility comments: Extra time and effort and bed rail required along with cues for general sequencing  Transfers Overall transfer level: Needs assistance Equipment used: Rolling walker (2 wheeled) Transfers: Sit to/from Stand Sit to Stand: From elevated surface;Min assist         General transfer comment: Min A for instability upon standing with min verbal cues for sequencing during transfers  Ambulation/Gait Ambulation/Gait assistance: Min assist;Mod assist Gait Distance (Feet): 1 Feet Assistive device: Rolling walker (2 wheeled) Gait Pattern/deviations: Step-to pattern Gait velocity: decreased   General Gait Details: BLEs buckled while taking 1-2 small steps at the EOB requiring min-mod A for stability, high fall risk  Stairs            Wheelchair Mobility    Modified Rankin (Stroke Patients Only)       Balance Overall balance assessment: Needs assistance   Sitting balance-Leahy Scale: Good     Standing balance support: Bilateral upper extremity supported Standing balance-Leahy Scale: Poor  Pertinent Vitals/Pain Pain Assessment: 0-10 Pain Score: 7  Pain Location: chronic back pain Pain Descriptors / Indicators: Sore Pain Intervention(s):  Monitored during session;Premedicated before session    Home Living Family/patient expects to be discharged to:: Private residence Living Arrangements: Spouse/significant other Available Help at Discharge: Family;Available 24 hours/day Type of Home: House Home Access: Stairs to enter Entrance Stairs-Rails: None Entrance Stairs-Number of Steps: 3 Home Layout: Multi-level;Bed/bath upstairs Home Equipment: Shower seat - built in;Wheelchair - manual      Prior Function Level of Independence: Independent         Comments: Ind amb community distances without an AD, no fall history, Ind with ADLs     Hand Dominance        Extremity/Trunk Assessment   Upper Extremity Assessment Upper Extremity Assessment: Generalized weakness    Lower Extremity Assessment Lower Extremity Assessment: Generalized weakness       Communication   Communication: No difficulties  Cognition Arousal/Alertness: Awake/alert Behavior During Therapy: WFL for tasks assessed/performed Overall Cognitive Status: Within Functional Limits for tasks assessed                                        General Comments      Exercises Total Joint Exercises Ankle Circles/Pumps: AROM;Strengthening;Both;5 reps;10 reps Quad Sets: Strengthening;Both;5 reps;10 reps Gluteal Sets: Strengthening;Both;5 reps;10 reps Hip ABduction/ADduction: AROM;AAROM;Both;10 reps Straight Leg Raises: AROM;AAROM;Both;10 reps Long Arc Quad: AROM;Both;10 reps Knee Flexion: AROM;Both;10 reps Other Exercises Other Exercises: HEP education with pt and spouse for BLE APs, QS, and GS x 10 each every 1-2 hrs daily   Assessment/Plan    PT Assessment Patient needs continued PT services  PT Problem List Decreased strength;Decreased activity tolerance;Decreased balance;Decreased mobility;Decreased knowledge of use of DME       PT Treatment Interventions DME instruction;Gait training;Stair training;Functional mobility  training;Therapeutic activities;Therapeutic exercise;Balance training;Patient/family education    PT Goals (Current goals can be found in the Care Plan section)  Acute Rehab PT Goals Patient Stated Goal: To get stronger PT Goal Formulation: With patient Time For Goal Achievement: 12/12/18 Potential to Achieve Goals: Good    Frequency Min 2X/week   Barriers to discharge Inaccessible home environment      Co-evaluation               AM-PAC PT "6 Clicks" Mobility  Outcome Measure Help needed turning from your back to your side while in a flat bed without using bedrails?: A Little Help needed moving from lying on your back to sitting on the side of a flat bed without using bedrails?: A Little Help needed moving to and from a bed to a chair (including a wheelchair)?: A Lot Help needed standing up from a chair using your arms (e.g., wheelchair or bedside chair)?: A Little Help needed to walk in hospital room?: Total Help needed climbing 3-5 steps with a railing? : Total 6 Click Score: 13    End of Session Equipment Utilized During Treatment: Gait belt;Oxygen Activity Tolerance: Patient tolerated treatment well Patient left: in bed;with call bell/phone within reach;with family/visitor present Nurse Communication: Mobility status PT Visit Diagnosis: Unsteadiness on feet (R26.81);Muscle weakness (generalized) (M62.81);Difficulty in walking, not elsewhere classified (R26.2)    Time: 7858-8502 PT Time Calculation (min) (ACUTE ONLY): 38 min   Charges:   PT Evaluation $PT Eval Moderate Complexity: 1 Mod PT Treatments $Therapeutic Exercise: 8-22 mins  Linus Salmons PT, DPT 11/29/18, 1:11 PM

## 2018-11-29 NOTE — Progress Notes (Signed)
CRITICAL CARE NOTE  CC  Follow up respiratory failure on mechanical ventilator   SUBJECTIVE Patient remains intubated Weaned off propofol in AM, continue fentanyl  Patient is awake and alert in PM, respond to question Update son and son's girlfriend at bedside   [place] 1/19 - Septic shock in ED, Intubated on mechanical ventilation  1/19 - Underwent TURP for prostate abscess 1/19 - Transferred to ICU 1/20 - Weaned off propofol in AM, remains intubated. Currently awake and alert 1/21 Passed SBT, extubated 1/22 tolerating extubation well.  Transfer to [place] floor ordered.  Results for orders placed or performed during the hospital encounter of 11/26/18  Culture, blood (routine x 2)     Status: Abnormal   Collection Time: 11/26/18  3:45 AM  Result Value Ref Range Status   Specimen Description   Final    BLOOD LEFT HAND Performed at [facility], [address], [place], [place] [address]    Special Requests   Final    BOTTLES DRAWN AEROBIC AND ANAEROBIC Blood Culture results may not be optimal due to an excessive volume of blood received in culture bottles Performed at [facility], [address] [place], [place] [address]    Culture  Setup Time   Final    IN BOTH AEROBIC AND ANAEROBIC BOTTLES GRAM NEGATIVE RODS RESULT CALLED TO, READ BACK BY AND VERIFIED WITH: RODNEY GRUBB AT 1505 11/26/18.MSS Performed at [facility], [place] [address] [place], [place] [address]    Culture ESCHERICHIA COLI (A)  Final   Report Status 11/29/2018 FINAL  Final   Organism ID, Bacteria ESCHERICHIA COLI  Final      Susceptibility   Escherichia coli - MIC*    AMPICILLIN >=32 RESISTANT Resistant     CEFAZOLIN <=4 SENSITIVE Sensitive     CEFEPIME <=1 SENSITIVE Sensitive     CEFTAZIDIME <=1 SENSITIVE Sensitive     CEFTRIAXONE <=1 SENSITIVE Sensitive     CIPROFLOXACIN <=0.25 SENSITIVE Sensitive     GENTAMICIN <=1 SENSITIVE Sensitive     IMIPENEM <=0.25  SENSITIVE Sensitive     TRIMETH/SULFA <=20 SENSITIVE Sensitive     AMPICILLIN/SULBACTAM >=32 RESISTANT Resistant     PIP/TAZO <=4 SENSITIVE Sensitive     Extended ESBL NEGATIVE Sensitive     * ESCHERICHIA COLI  Culture, blood (routine x 2)     Status: Abnormal   Collection Time: 11/26/18  3:45 AM  Result Value Ref Range Status   Specimen Description   Final    BLOOD LEFT ARM Performed at [facility], [address] [place], [place] [address]    Special Requests   Final    BOTTLES DRAWN AEROBIC AND ANAEROBIC Blood Culture results may not be optimal due to an excessive volume of blood received in culture bottles Performed at [facility], [address] [place], [place] [address]    Culture  Setup Time   Final    GRAM NEGATIVE RODS IN BOTH AEROBIC AND ANAEROBIC BOTTLES CRITICAL VALUE NOTED.  VALUE IS CONSISTENT WITH PREVIOUSLY REPORTED AND CALLED VALUE. GRAM STAIN REVIEWED-AGREE WITH RESULT T. TYSOR    Culture (A)  Final    ESCHERICHIA COLI SUSCEPTIBILITIES PERFORMED ON PREVIOUS CULTURE WITHIN THE LAST 5 DAYS. Performed at [facility], [place] [address], [place], [place] [address]    Report Status 11/29/2018 FINAL  Final  Blood Culture ID Panel (Reflexed)     Status: Abnormal   Collection Time: 11/26/18  3:45  AM  Result Value Ref Range Status   Enterococcus species NOT DETECTED NOT DETECTED Final   Listeria monocytogenes NOT DETECTED NOT DETECTED Final   Staphylococcus species NOT DETECTED NOT DETECTED Final   Staphylococcus aureus (BCID) NOT DETECTED NOT DETECTED Final   Streptococcus species NOT DETECTED NOT DETECTED Final   Streptococcus agalactiae NOT DETECTED NOT DETECTED Final   Streptococcus pneumoniae NOT DETECTED NOT DETECTED Final   Streptococcus pyogenes NOT DETECTED NOT DETECTED Final   Acinetobacter baumannii NOT DETECTED NOT DETECTED Final   Enterobacteriaceae species DETECTED (A) NOT DETECTED Final    Comment: Enterobacteriaceae  represent a large family of gram-negative bacteria, not a single organism. RESULT CALLED TO, READ BACK BY AND VERIFIED WITH: RODNEY GRUBB AT 7353 11/26/18. MSS    Enterobacter cloacae complex NOT DETECTED NOT DETECTED Final   Escherichia coli DETECTED (A) NOT DETECTED Final    Comment: RESULT CALLED TO, READ BACK BY AND VERIFIED WITH: RODNEY GRUBB AT 2992 11/26/18. MSS    Klebsiella oxytoca NOT DETECTED NOT DETECTED Final   Klebsiella pneumoniae NOT DETECTED NOT DETECTED Final   Proteus species NOT DETECTED NOT DETECTED Final   Serratia marcescens NOT DETECTED NOT DETECTED Final   Carbapenem resistance NOT DETECTED NOT DETECTED Final   Haemophilus influenzae NOT DETECTED NOT DETECTED Final   Neisseria meningitidis NOT DETECTED NOT DETECTED Final   Pseudomonas aeruginosa NOT DETECTED NOT DETECTED Final   Candida albicans NOT DETECTED NOT DETECTED Final   Candida glabrata NOT DETECTED NOT DETECTED Final   Candida krusei NOT DETECTED NOT DETECTED Final   Candida parapsilosis NOT DETECTED NOT DETECTED Final   Candida tropicalis NOT DETECTED NOT DETECTED Final    Comment: Performed at [facility], [address], [place], [place] [address]  Urine culture     Status: Abnormal   Collection Time: 11/26/18  3:52 AM  Result Value Ref Range Status   Specimen Description   Final    URINE, RANDOM Performed at [facility], [address], [place], [place] [address]    Special Requests   Final    NONE Performed at [facility], [place], [place] [address]    Culture (A)  Final    >=100,000 COLONIES/mL ESCHERICHIA COLI 40,000 COLONIES/mL KLEBSIELLA PNEUMONIAE    Report Status 11/29/2018 FINAL  Final   Organism ID, Bacteria ESCHERICHIA COLI (A)  Final   Organism ID, Bacteria KLEBSIELLA PNEUMONIAE (A)  Final      Susceptibility   Escherichia coli - MIC*    AMPICILLIN >=32 RESISTANT Resistant     CEFAZOLIN <=4 SENSITIVE Sensitive     CEFTRIAXONE  <=1 SENSITIVE Sensitive     CIPROFLOXACIN <=0.25 SENSITIVE Sensitive     GENTAMICIN <=1 SENSITIVE Sensitive     IMIPENEM <=0.25 SENSITIVE Sensitive     NITROFURANTOIN <=16 SENSITIVE Sensitive     TRIMETH/SULFA <=20 SENSITIVE Sensitive     AMPICILLIN/SULBACTAM >=32 RESISTANT Resistant     PIP/TAZO <=4 SENSITIVE Sensitive     Extended ESBL NEGATIVE Sensitive     * >=100,000 COLONIES/mL ESCHERICHIA COLI   Klebsiella pneumoniae - MIC*    AMPICILLIN >=32 RESISTANT Resistant     CEFAZOLIN <=4 SENSITIVE Sensitive     CEFTRIAXONE <=1 SENSITIVE Sensitive     CIPROFLOXACIN <=0.25 SENSITIVE Sensitive     GENTAMICIN <=1 SENSITIVE Sensitive     IMIPENEM <=0.25 SENSITIVE Sensitive     NITROFURANTOIN 64 INTERMEDIATE Intermediate     TRIMETH/SULFA >=320 RESISTANT Resistant  AMPICILLIN/SULBACTAM 4 SENSITIVE Sensitive     PIP/TAZO <=4 SENSITIVE Sensitive     Extended ESBL NEGATIVE Sensitive     * 40,000 COLONIES/mL KLEBSIELLA PNEUMONIAE  MRSA PCR Screening     Status: None   Collection Time: 11/26/18  2:21 PM  Result Value Ref Range Status   MRSA by PCR NEGATIVE NEGATIVE Final    Comment:        The GeneXpert MRSA Assay (FDA approved for NASAL specimens only), is one component of a comprehensive MRSA colonization surveillance program. It is not intended to diagnose MRSA infection nor to guide or monitor treatment for MRSA infections. Performed at [facility], [address] [place], [place] [address]    Anti-infectives (From admission, onward)   Start     Dose/Rate Route Frequency Ordered Stop   11/28/18 1400  ceFAZolin (ANCEF) IVPB 2g/100 mL premix     2 g 200 mL/hr over 30 Minutes Intravenous Every 8 hours 11/28/18 1054     11/27/18 1400  meropenem (MERREM) 1 g in sodium chloride 0.9 % 100 mL IVPB  Status:  Discontinued     1 g 200 mL/hr over 30 Minutes Intravenous Every 8 hours 11/27/18 1247 11/28/18 1033   11/27/18 0000  vancomycin (VANCOCIN) IVPB 1000 mg/200 mL  premix  Status:  Discontinued     1,000 mg 200 mL/hr over 60 Minutes Intravenous Every 24 hours 11/26/18 0942 11/26/18 1728   11/26/18 1800  ceFEPIme (MAXIPIME) 2 g in sodium chloride 0.9 % 100 mL IVPB  Status:  Discontinued     2 g 200 mL/hr over 30 Minutes Intravenous Every 12 hours 11/26/18 0942 11/27/18 1247   11/26/18 1115  ciprofloxacin (CIPRO) IVPB 400 mg  Status:  Discontinued     400 mg 200 mL/hr over 60 Minutes Intravenous Every 12 hours 11/26/18 1108 11/27/18 1350   11/26/18 0345  ceFEPIme (MAXIPIME) 2 g in sodium chloride 0.9 % 100 mL IVPB     2 g 200 mL/hr over 30 Minutes Intravenous  Once 11/26/18 0339 11/26/18 0451   11/26/18 0345  metroNIDAZOLE (FLAGYL) IVPB 500 mg  Status:  Discontinued     500 mg 100 mL/hr over 60 Minutes Intravenous Every 8 hours 11/26/18 0339 11/26/18 1110   11/26/18 0345  vancomycin (VANCOCIN) IVPB 1000 mg/200 mL premix  Status:  Discontinued     1,000 mg 200 mL/hr over 60 Minutes Intravenous  Once 11/26/18 0339 11/26/18 0341   11/26/18 0345  vancomycin (VANCOCIN) 2,000 mg in sodium chloride 0.9 % 500 mL IVPB     2,000 mg 250 mL/hr over 120 Minutes Intravenous  Once 11/26/18 0344 11/26/18 0617        BP 123/65 (BP Location: Left Arm)   Pulse 78   Temp 98 F (36.7 C) (Axillary)   Resp (!) 23   Ht 5\' 11"  (1.803 m)   Wt 109.8 kg   SpO2 97%   BMI 33.76 kg/m   SUBJ: No new complaints.  No distress  PHYSICAL EXAMINATION:  Gen: WDWN in NAD HEENT: NCAT, sclerae white, oropharynx normal Neck: No LAN, no JVD noted Lungs: full BS, no adventitious sounds anteriorly Cardiovascular: Regular, normal rate, no M noted Abdomen: Soft, NT, +BS Ext: Mild symmetric carpal and pedal edema Neuro: PERRL, EOMI, motor/sensory grossly intact Skin: No lesions noted     BMP Latest Ref Rng & Units 11/29/2018 11/28/2018 11/27/2018  Glucose 70 - 99 mg/dL 227(H) 194(H) 290(H)  BUN 8 - 23 mg/dL 50(H)  56(H) 58(H)  Creatinine 0.61 - 1.24 mg/dL 1.03 0.97 1.11   BUN/Creat Ratio 10 - 24 - - -  Sodium 135 - 145 mmol/L 144 147(H) 143  Potassium 3.5 - 5.1 mmol/L 3.5 3.5 4.0  Chloride 98 - 111 mmol/L 111 115(H) 112(H)  CO2 22 - 32 mmol/L 29 28 25   Calcium 8.9 - 10.3 mg/dL 8.8(L) 8.3(L) 7.6(L)   CBC Latest Ref Rng & Units 11/29/2018 11/28/2018 11/27/2018  WBC 4.0 - 10.5 K/uL 4.9 3.6(L) 2.7(L)  Hemoglobin 13.0 - 17.0 g/dL 8.0(L) 7.9(L) 7.7(L)  Hematocrit 39.0 - 52.0 % 24.8(L) 24.0(L) 24.0(L)  Platelets 150 - 400 K/uL 36(L) 32(L) 43(L)   CXR: No new film   IMPRESSION: Ventilator dependent respiratory failure, resolved Severe sepsis/septic shock, resolved E. coli bacteremia Prostate abscess Possible pneumonia AKI, resolved Mild hypernatremia, resolving Borderline hypokalemia ICU acquired anemia without evidence of acute blood loss Severe thrombocytopenia, stable History of cirrhosis  PLAN/REC: Continue supplemental oxygen as needed Monitor temp, WBC count Continue cefazolin.  Duration to be determined by urology Monitor BMET intermittently Monitor I/Os Correct electrolytes as indicated DVT px: SCDs Monitor CBC intermittently  Transfuse per usual guidelines Advance diet and activity as able  Transfer to [place] floor.  After transfer, PCCM will sign off. Please call if we can be of further assistance  Merton Border, MD PCCM service Mobile [phone] Pager [phone] 11/29/2018 1:59 PM

## 2018-11-29 NOTE — Progress Notes (Signed)
Patient's home unit at bedside. Home unit has been reviewed by RRT and found to be in proper working condition. No lose or frayed cords. Plugged into red outlet. Patient resting comfortably in bed SAT at 97% on room air.

## 2018-11-30 LAB — BASIC METABOLIC PANEL
Anion gap: 5 (ref 5–15)
BUN: 41 mg/dL — ABNORMAL HIGH (ref 8–23)
CO2: 28 mmol/L (ref 22–32)
Calcium: 8.5 mg/dL — ABNORMAL LOW (ref 8.9–10.3)
Chloride: 106 mmol/L (ref 98–111)
Creatinine, Ser: 0.9 mg/dL (ref 0.61–1.24)
GFR calc non Af Amer: >60 mL/min (ref >60)
Glucose, Bld: 256 mg/dL — ABNORMAL HIGH (ref 70–99)
Potassium: 3.4 mmol/L — ABNORMAL LOW (ref 3.5–5.1)
SODIUM: 139 mmol/L (ref 135–145)

## 2018-11-30 LAB — CBC
HCT: 24.5 % — ABNORMAL LOW (ref 39.0–52.0)
Hemoglobin: 8.3 g/dL — ABNORMAL LOW (ref 13.0–17.0)
MCH: 30.6 pg (ref 26.0–34.0)
MCHC: 33.9 g/dL (ref 30.0–36.0)
MCV: 90.4 fL (ref 80.0–100.0)
NRBC: 0.5 % — AB (ref 0.0–0.2)
Platelets: 44 10*3/uL — ABNORMAL LOW (ref 150–400)
RBC: 2.71 MIL/uL — ABNORMAL LOW (ref 4.22–5.81)
RDW: 15.2 % (ref 11.5–15.5)
WBC: 6.3 10*3/uL (ref 4.0–10.5)

## 2018-11-30 LAB — GLUCOSE, CAPILLARY
Glucose-Capillary: 261 mg/dL — ABNORMAL HIGH (ref 70–99)
Glucose-Capillary: 277 mg/dL — ABNORMAL HIGH (ref 70–99)

## 2018-11-30 MED ORDER — CEFUROXIME AXETIL 250 MG PO TABS: 250.0000 mg | ORAL_TABLET | Freq: Two times a day (BID) | ORAL | 0 refills | Status: AC

## 2018-11-30 MED ORDER — ADULT MULTIVITAMIN W/MINERALS CH: 1.0000 | ORAL_TABLET | Freq: Every day | ORAL | 0 refills | Status: AC

## 2018-11-30 MED ORDER — ACETAMINOPHEN 325 MG PO TABS: 650.0000 mg | ORAL_TABLET | Freq: Four times a day (QID) | ORAL | 0 refills | Status: AC | PRN

## 2018-11-30 NOTE — Progress Notes (Signed)
[facility] [place] NAME: Mason Wilson    MR#:  [id number]  DATE OF BIRTH:  26-Aug-1941  DATE OF ADMISSION:  11/26/2018 ADMITTING PHYSICIAN: Saundra Shelling, MD  DATE OF DISCHARGE: 11/30/2018   PRIMARY CARE PHYSICIAN: [name], MD    ADMISSION DIAGNOSIS:  Acute prostatitis [N41.0] Prostate abscess [N41.2] Myasthenia gravis ([place]) [G70.00] Septic shock ([place]) [A41.9, R65.21] Acute pancreatitis without infection or necrosis, unspecified pancreatitis type [K85.90] Pneumonia of both lungs due to infectious organism, unspecified part of lung [J18.9]  DISCHARGE DIAGNOSIS:  Active Problems:   Septic shock ([place])   Prostate abscess   SECONDARY DIAGNOSIS:   Past Medical History:  Diagnosis Date  . Allergy   . Arthritis    "everywhere' - worse in back  . Body mass index 39.0-39.9, adult   . CAD (coronary artery disease)   . Chronic back pain   . Cirrhosis, non-alcoholic ([place])   . Diabetes mellitus without complication ([place])   . Elevated liver enzymes   . GERD (gastroesophageal reflux disease)   . Heart murmur   . Hyperlipidemia   . Hypertension   . Hypogonadism in male   . Melanoma ([place])    melanoma   . Melanoma in situ of right shoulder ([place])   . Myasthenia gravis ([place]) 01/2018  . Renal stones   . Sleep apnea    CPAP  . Uses hearing aid    doesn't wear    HOSPITAL COURSE:   78 year old male patient with history of hypertension, hyperlipidemia, melanoma, hypogonadism currently in the ICU for fever, chills, nausea abdominal discomfort  *Acute septic shock Resolving Successfully weaned off pressor support, continue IV Ancef   *Acute sepsis from E. coli bacteremia and Enterobacteriaceae Antibiotics per above, sensitivities noted For prostate abscess will need antibiotics for up to 6 weeks per urology.  Change to oral per sensitivity report.  *Acute hypoxic respiratory failure Resolving  status post  extubation  *Acute prostatic abscess Resolving S/p TURP  Continue IV Ancef Urology input appreciated  *Acute E. coli and Klebsiella UTI Initially treated with meropenem, changed to Ancef based on sensitivities  *Acute pneumonia  Resolving Continue pneumonia protocol, antibiotics per above   *Acute pancreatitis without necrosis Stable  *Pancytopenia  Suspect due to sepsis  Avoid blood thinner medication  *Chronic Myasthenia gravis Stable Continue oral pyridostigmine Supportive care  DISCHARGE CONDITIONS:   Stable.  CONSULTS OBTAINED:  Treatment Team:  Mason Seal, MD Mason Door, MD  DRUG ALLERGIES:   Allergies  Allergen Reactions  . Altace [Ramipril] Cough    DISCHARGE MEDICATIONS:   Allergies as of 11/30/2018      Reactions   Altace [ramipril] Cough      Medication List    STOP taking these medications   cephALEXin 500 MG capsule Commonly known as:  KEFLEX   hydrochlorothiazide 25 MG tablet Commonly known as:  HYDRODIURIL   predniSONE 20 MG tablet Commonly known as:  DELTASONE   sulfamethoxazole-trimethoprim 800-160 MG tablet Commonly known as:  BACTRIM DS,SEPTRA DS     TAKE these medications   acetaminophen 325 MG tablet Commonly known as:  TYLENOL Place 2 tablets (650 mg total) into feeding tube every 6 (six) hours as needed for mild pain (or Fever >/= 101).   CALCIUM 1200 PO Take 1,200 mg by mouth.   cefUROXime 250 MG tablet Commonly known as:  CEFTIN Take 1 tablet (250 mg total) by mouth 2 (two) times daily.  glipiZIDE 5 MG tablet Commonly known as:  GLUCOTROL Take 1 tablet (5 mg total) by mouth daily before breakfast.   losartan 100 MG tablet Commonly known as:  COZAAR Take 1 tablet (100 mg total) by mouth daily.   metFORMIN 500 MG tablet Commonly known as:  GLUCOPHAGE Take 2 tablets (1,000 mg total) by mouth 2 (two) times daily with a meal.   metoprolol succinate 50 MG 24 hr tablet Commonly known as:   TOPROL-XL Take 1 tablet (50 mg total) by mouth daily. Take with or immediately following a meal.   multivitamin with minerals Tabs tablet Take 1 tablet by mouth daily. Start taking on:  December 01, 2018   omeprazole 40 MG capsule Commonly known as:  PRILOSEC Take 40 mg by mouth daily.   pyridostigmine 60 MG tablet Commonly known as:  MESTINON Take 60 mg by mouth 4 (four) times daily.   simvastatin 40 MG tablet Commonly known as:  ZOCOR Take 1 tablet (40 mg total) by mouth daily at 6 PM.   tamsulosin 0.4 MG Caps capsule Commonly known as:  FLOMAX TAKE 1 CAPSULE(0.4 MG) BY MOUTH DAILY            Durable Medical Equipment  (From admission, onward)         Start     Ordered   11/30/18 1107  For home use only DME Walker  Once    Question:  Patient needs a walker to treat with the following condition  Answer:  Weakness   11/30/18 1107   11/30/18 1107  For home use only DME Bedside commode  Once    Question:  Patient needs a bedside commode to treat with the following condition  Answer:  Weakness   11/30/18 1107           DISCHARGE INSTRUCTIONS:    Follow with urology clinic in 1-2 weeks.  If you experience worsening of your admission symptoms, develop shortness of breath, life threatening emergency, suicidal or homicidal thoughts you must seek medical attention immediately by calling 911 or calling your MD immediately  if symptoms less severe.  You Must read complete instructions/literature along with all the possible adverse reactions/side effects for all the Medicines you take and that have been prescribed to you. Take any new Medicines after you have completely understood and accept all the possible adverse reactions/side effects.   Please note  You were cared for by a hospitalist during your hospital stay. If you have any questions about your discharge medications or the care you received while you were in the hospital after you are discharged, you can call the  unit and asked to speak with the hospitalist on call if the hospitalist that took care of you is not available. Once you are discharged, your primary care physician will handle any further medical issues. Please note that NO REFILLS for any discharge medications will be authorized once you are discharged, as it is imperative that you return to your primary care physician (or establish a relationship with a primary care physician if you do not have one) for your aftercare needs so that they can reassess your need for medications and monitor your lab values.    Today   CHIEF COMPLAINT:   Chief Complaint  Patient presents with  . Shortness of Breath  . Nausea  . Diarrhea  . Rectal Bleeding    HISTORY OF PRESENT ILLNESS:  Mason Wilson  is a 78 y.o. male with a known history of  hyperlipidemia, hypertension, melanoma, hypogonadism, sleep apnea presented to the emergency room for fever, shaking chills and abdominal discomfort.  Patient also had some nausea and vomiting.  Patient was altered and confused when he presented to the emergency room.  History of urinary tract infections in the past and recently completed a course of antibiotics that is Bactrim and Keflex.  Patient was evaluated in the emergency room he was found to be septic.  He was given 4 L IV fluids.  Patient was worked up with CT abdomen which showed abscess in the prostate.  Urology consultation was done.  CT scanning of the chest and abdomen also showed pneumonia.  Patient started on broad-spectrum IV antibiotics.  After receiving the fluids secondary to pulmonary edema and respiratory distress he was intubated and put on ventilator.  Patient was started on IV milrinone drip.  Hospitalist service was consulted.   VITAL SIGNS:  Blood pressure (!) 152/75, pulse (!) 101, temperature 98.6 F (37 C), temperature source Oral, resp. rate 20, height 5\' 11"  (1.803 m), weight 109.8 kg, SpO2 98 %.  I/O:    Intake/Output Summary (Last 24  hours) at 11/30/2018 1555 Last data filed at 11/30/2018 1525 Gross per 24 hour  Intake 1767.19 ml  Output 750 ml  Net 1017.19 ml    PHYSICAL EXAMINATION:  GENERAL:  78 y.o.-year-old patient lying in the bed with no acute distress.  EYES: Pupils equal, round, reactive to light and accommodation. No scleral icterus. Extraocular muscles intact.  HEENT: Head atraumatic, normocephalic. Oropharynx and nasopharynx clear.  NECK:  Supple, no jugular venous distention. No thyroid enlargement, no tenderness.  LUNGS: Normal breath sounds bilaterally, no wheezing, rales,rhonchi or crepitation. No use of accessory muscles of respiration.  CARDIOVASCULAR: S1, S2 normal. No murmurs, rubs, or gallops.  ABDOMEN: Soft, non-tender, non-distended. Bowel sounds present. No organomegaly or mass.  EXTREMITIES: No pedal edema, cyanosis, or clubbing.  NEUROLOGIC: Cranial nerves II through XII are intact. Muscle strength 4/5 in all extremities. Sensation intact. Gait not checked.  PSYCHIATRIC: The patient is alert and oriented x 3.  SKIN: No obvious rash, lesion, or ulcer.   DATA REVIEW:   CBC Recent Labs  Lab 11/30/18 0515  WBC 6.3  HGB 8.3*  HCT 24.5*  PLT 44*    Chemistries  Recent Labs  Lab 11/26/18 2110  11/28/18 0436  11/30/18 0515  NA 141   < > 147*   < > 139  K 5.1   < > 3.5   < > 3.4*  CL 115*   < > 115*   < > 106  CO2 18*   < > 28   < > 28  GLUCOSE 312*   < > 194*   < > 256*  BUN 57*   < > 56*   < > 41*  CREATININE 1.08   < > 0.97   < > 0.90  CALCIUM 7.7*   < > 8.3*   < > 8.5*  MG 1.9  --   --   --   --   AST 29   < > 18  --   --   ALT 74*   < > 52*  --   --   ALKPHOS 77   < > 63  --   --   BILITOT 3.6*   < > 1.4*  --   --    < > = values in this interval not displayed.    Cardiac Enzymes Recent Labs  Lab 11/27/18 0141  TROPONINI 0.29*    Microbiology Results  Results for orders placed or performed during the hospital encounter of 11/26/18  Culture, blood (routine x 2)      Status: Abnormal   Collection Time: 11/26/18  3:45 AM  Result Value Ref Range Status   Specimen Description   Final    BLOOD LEFT HAND Performed at [facility], [address], [place], [place] [address]    Special Requests   Final    BOTTLES DRAWN AEROBIC AND ANAEROBIC Blood Culture results may not be optimal due to an excessive volume of blood received in culture bottles Performed at [facility], [address] [place], [place] [address]    Culture  Setup Time   Final    IN BOTH AEROBIC AND ANAEROBIC BOTTLES GRAM NEGATIVE RODS RESULT CALLED TO, READ BACK BY AND VERIFIED WITH: RODNEY GRUBB AT 1505 11/26/18.MSS Performed at [facility], [place] [address], [place], [place] [address]    Culture ESCHERICHIA COLI (A)  Final   Report Status 11/29/2018 FINAL  Final   Organism ID, Bacteria ESCHERICHIA COLI  Final      Susceptibility   Escherichia coli - MIC*    AMPICILLIN >=32 RESISTANT Resistant     CEFAZOLIN <=4 SENSITIVE Sensitive     CEFEPIME <=1 SENSITIVE Sensitive     CEFTAZIDIME <=1 SENSITIVE Sensitive     CEFTRIAXONE <=1 SENSITIVE Sensitive     CIPROFLOXACIN <=0.25 SENSITIVE Sensitive     GENTAMICIN <=1 SENSITIVE Sensitive     IMIPENEM <=0.25 SENSITIVE Sensitive     TRIMETH/SULFA <=20 SENSITIVE Sensitive     AMPICILLIN/SULBACTAM >=32 RESISTANT Resistant     PIP/TAZO <=4 SENSITIVE Sensitive     Extended ESBL NEGATIVE Sensitive     * ESCHERICHIA COLI  Culture, blood (routine x 2)     Status: Abnormal   Collection Time: 11/26/18  3:45 AM  Result Value Ref Range Status   Specimen Description   Final    BLOOD LEFT ARM Performed at [facility], [address], [place], [place] [address]    Special Requests   Final    BOTTLES DRAWN AEROBIC AND ANAEROBIC Blood Culture results may not be optimal due to an excessive volume of blood received in culture bottles Performed at [facility], [address] [place], [place] [address]     Culture  Setup Time   Final    GRAM NEGATIVE RODS IN BOTH AEROBIC AND ANAEROBIC BOTTLES CRITICAL VALUE NOTED.  VALUE IS CONSISTENT WITH PREVIOUSLY REPORTED AND CALLED VALUE. GRAM STAIN REVIEWED-AGREE WITH RESULT T. TYSOR    Culture (A)  Final    ESCHERICHIA COLI SUSCEPTIBILITIES PERFORMED ON PREVIOUS CULTURE WITHIN THE LAST 5 DAYS. Performed at [facility], [place] [address], [place],  [address]    Report Status 11/29/2018 FINAL  Final  Blood Culture ID Panel (Reflexed)     Status: Abnormal   Collection Time: 11/26/18  3:45 AM  Result Value Ref Range Status   Enterococcus species NOT DETECTED NOT DETECTED Final   Listeria monocytogenes NOT DETECTED NOT DETECTED Final   Staphylococcus species NOT DETECTED NOT DETECTED Final   Staphylococcus aureus (BCID) NOT DETECTED NOT DETECTED Final   Streptococcus species NOT DETECTED NOT DETECTED Final   Streptococcus agalactiae NOT DETECTED NOT DETECTED Final   Streptococcus pneumoniae NOT DETECTED NOT DETECTED Final   Streptococcus pyogenes NOT DETECTED NOT DETECTED Final   Acinetobacter baumannii NOT DETECTED  NOT DETECTED Final   Enterobacteriaceae species DETECTED (A) NOT DETECTED Final    Comment: Enterobacteriaceae represent a large family of gram-negative bacteria, not a single organism. RESULT CALLED TO, READ BACK BY AND VERIFIED WITH: RODNEY GRUBB AT 0354 11/26/18. MSS    Enterobacter cloacae complex NOT DETECTED NOT DETECTED Final   Escherichia coli DETECTED (A) NOT DETECTED Final    Comment: RESULT CALLED TO, READ BACK BY AND VERIFIED WITH: RODNEY GRUBB AT 6568 11/26/18. MSS    Klebsiella oxytoca NOT DETECTED NOT DETECTED Final   Klebsiella pneumoniae NOT DETECTED NOT DETECTED Final   Proteus species NOT DETECTED NOT DETECTED Final   Serratia marcescens NOT DETECTED NOT DETECTED Final   Carbapenem resistance NOT DETECTED NOT DETECTED Final   Haemophilus influenzae NOT DETECTED NOT DETECTED Final   Neisseria meningitidis  NOT DETECTED NOT DETECTED Final   Pseudomonas aeruginosa NOT DETECTED NOT DETECTED Final   Candida albicans NOT DETECTED NOT DETECTED Final   Candida glabrata NOT DETECTED NOT DETECTED Final   Candida krusei NOT DETECTED NOT DETECTED Final   Candida parapsilosis NOT DETECTED NOT DETECTED Final   Candida tropicalis NOT DETECTED NOT DETECTED Final    Comment: Performed at [facility], [address] [place], [place] [address]  Urine culture     Status: Abnormal   Collection Time: 11/26/18  3:52 AM  Result Value Ref Range Status   Specimen Description   Final    URINE, RANDOM Performed at [facility], [address] [place], [place] [address]    Special Requests   Final    NONE Performed at [facility], [address] [place], [place] [address]    Culture (A)  Final    >=100,000 COLONIES/mL ESCHERICHIA COLI 40,000 COLONIES/mL KLEBSIELLA PNEUMONIAE    Report Status 11/29/2018 FINAL  Final   Organism ID, Bacteria ESCHERICHIA COLI (A)  Final   Organism ID, Bacteria KLEBSIELLA PNEUMONIAE (A)  Final      Susceptibility   Escherichia coli - MIC*    AMPICILLIN >=32 RESISTANT Resistant     CEFAZOLIN <=4 SENSITIVE Sensitive     CEFTRIAXONE <=1 SENSITIVE Sensitive     CIPROFLOXACIN <=0.25 SENSITIVE Sensitive     GENTAMICIN <=1 SENSITIVE Sensitive     IMIPENEM <=0.25 SENSITIVE Sensitive     NITROFURANTOIN <=16 SENSITIVE Sensitive     TRIMETH/SULFA <=20 SENSITIVE Sensitive     AMPICILLIN/SULBACTAM >=32 RESISTANT Resistant     PIP/TAZO <=4 SENSITIVE Sensitive     Extended ESBL NEGATIVE Sensitive     * >=100,000 COLONIES/mL ESCHERICHIA COLI   Klebsiella pneumoniae - MIC*    AMPICILLIN >=32 RESISTANT Resistant     CEFAZOLIN <=4 SENSITIVE Sensitive     CEFTRIAXONE <=1 SENSITIVE Sensitive     CIPROFLOXACIN <=0.25 SENSITIVE Sensitive     GENTAMICIN <=1 SENSITIVE Sensitive     IMIPENEM <=0.25 SENSITIVE Sensitive     NITROFURANTOIN 64 INTERMEDIATE  Intermediate     TRIMETH/SULFA >=320 RESISTANT Resistant     AMPICILLIN/SULBACTAM 4 SENSITIVE Sensitive     PIP/TAZO <=4 SENSITIVE Sensitive     Extended ESBL NEGATIVE Sensitive     * 40,000 COLONIES/mL KLEBSIELLA PNEUMONIAE  MRSA PCR Screening     Status: None   Collection Time: 11/26/18  2:21 PM  Result Value Ref Range Status   MRSA by PCR NEGATIVE NEGATIVE Final    Comment:        The GeneXpert MRSA Assay (FDA approved for NASAL specimens only), is  one component of a comprehensive MRSA colonization surveillance program. It is not intended to diagnose MRSA infection nor to guide or monitor treatment for MRSA infections. Performed at [facility], [address] [place], [place] [address]     RADIOLOGY:  No results found.  EKG:   Orders placed or performed during the hospital encounter of 11/26/18  . EKG 12-Lead  . EKG 12-Lead      Management plans discussed with the patient, family and they are in agreement.  CODE STATUS:     Code Status Orders  (From admission, onward)         Start     Ordered   11/26/18 1143  Full code  Continuous     11/26/18 1142        Code Status History    Date Active Date Inactive Code Status Order ID Comments User Context   06/23/2016 1427 06/25/2016 1722 Full Code [id number]  Epifanio Lesches, MD ED      TOTAL TIME TAKING CARE OF THIS PATIENT: 35 minutes.    Vaughan Basta M.D on 11/30/2018 at 3:55 PM  Between 7am to 6pm - Pager - [phone]  After 6pm go to [email or web address] - password [email or web address] [facility]  Office  [phone]  CC: Primary care physician; [name], MD   Note: This dictation was prepared with Dragon dictation along with smaller phrase technology. Any transcriptional errors that result from this process are unintentional.

## 2018-11-30 NOTE — Anesthesia Postprocedure Evaluation (Signed)
Anesthesia Post Note  Patient: Levert Heslop.  Procedure(s) Performed: TRANSURETHRAL RESECTION OF THE PROSTATE (TURP) (N/A )  Patient location during evaluation: PACU Anesthesia Type: General Level of consciousness: awake and alert Pain management: pain level controlled Vital Signs Assessment: post-procedure vital signs reviewed and stable Respiratory status: spontaneous breathing, nonlabored ventilation, respiratory function stable and patient connected to nasal cannula oxygen Cardiovascular status: blood pressure returned to baseline and stable Postop Assessment: no apparent nausea or vomiting Anesthetic complications: no     Last Vitals:  Vitals:   11/30/18 0526 11/30/18 1154  BP: (!) 146/78 (!) 152/75  Pulse: (!) 107 (!) 101  Resp: 15 20  Temp: 36.9 C 37 C  SpO2: 98% 98%    Last Pain:  Vitals:   11/30/18 1154  TempSrc: Oral  PainSc:                  Molli Barrows

## 2018-11-30 NOTE — Discharge Planning (Signed)
Patient IVx3 and IJ removed.  RN assessment and VS revealed stability for DC to home with [facility]  Discharge papers given,explained and educated.  Informed of suggested FU appt and appt made.  Instructed on s/sx of infection and when to contact the Dr. Or return to the hospital.  Gilford Rile given prior to DC and no pain reported.  Once ready, will be wheeled to front and family transporting home via car.

## 2018-11-30 NOTE — Care Management Important Message (Signed)
Copy of signed Medicare IM left with patient in room. 

## 2018-11-30 NOTE — Progress Notes (Signed)
Urology Consult Follow Up  Subjective: S/P TURP for prostate abscess 11/26/2018  WBC count 6.3, up from 4.9  Creatinine 0.90.   Blood cultures positive for enterobacteriaceae and e.coli.  Urine culture positive for e.coli and klebsiella pneumoniae.  Good urine output - pink tinged in the urinal.    Anti-infectives: Anti-infectives (From admission, onward)   Start     Dose/Rate Route Frequency Ordered Stop   11/28/18 1400  ceFAZolin (ANCEF) IVPB 2g/100 mL premix     2 g 200 mL/hr over 30 Minutes Intravenous Every 8 hours 11/28/18 1054     11/27/18 1400  meropenem (MERREM) 1 g in sodium chloride 0.9 % 100 mL IVPB  Status:  Discontinued     1 g 200 mL/hr over 30 Minutes Intravenous Every 8 hours 11/27/18 1247 11/28/18 1033   11/27/18 0000  vancomycin (VANCOCIN) IVPB 1000 mg/200 mL premix  Status:  Discontinued     1,000 mg 200 mL/hr over 60 Minutes Intravenous Every 24 hours 11/26/18 0942 11/26/18 1728   11/26/18 1800  ceFEPIme (MAXIPIME) 2 g in sodium chloride 0.9 % 100 mL IVPB  Status:  Discontinued     2 g 200 mL/hr over 30 Minutes Intravenous Every 12 hours 11/26/18 0942 11/27/18 1247   11/26/18 1115  ciprofloxacin (CIPRO) IVPB 400 mg  Status:  Discontinued     400 mg 200 mL/hr over 60 Minutes Intravenous Every 12 hours 11/26/18 1108 11/27/18 1350   11/26/18 0345  ceFEPIme (MAXIPIME) 2 g in sodium chloride 0.9 % 100 mL IVPB     2 g 200 mL/hr over 30 Minutes Intravenous  Once 11/26/18 0339 11/26/18 0451   11/26/18 0345  metroNIDAZOLE (FLAGYL) IVPB 500 mg  Status:  Discontinued     500 mg 100 mL/hr over 60 Minutes Intravenous Every 8 hours 11/26/18 0339 11/26/18 1110   11/26/18 0345  vancomycin (VANCOCIN) IVPB 1000 mg/200 mL premix  Status:  Discontinued     1,000 mg 200 mL/hr over 60 Minutes Intravenous  Once 11/26/18 0339 11/26/18 0341   11/26/18 0345  vancomycin (VANCOCIN) 2,000 mg in sodium chloride 0.9 % 500 mL IVPB     2,000 mg 250 mL/hr over 120 Minutes Intravenous  Once  11/26/18 0344 11/26/18 0617      Current Facility-Administered Medications  Medication Dose Route Frequency Provider Last Rate Last Dose  . 0.9 %  sodium chloride infusion  250 mL Intravenous PRN Pyreddy, Reatha Harps, MD      . acetaminophen (TYLENOL) tablet 650 mg  650 mg Per Tube Q6H PRN Wilhelmina Mcardle, MD   650 mg at 11/29/18 2055   Or  . acetaminophen (TYLENOL) suppository 650 mg  650 mg Rectal Q6H PRN Wilhelmina Mcardle, MD      . ceFAZolin (ANCEF) IVPB 2g/100 mL premix  2 g Intravenous Q8H Charlett Nose, RPH 200 mL/hr at 11/30/18 0610 2 g at 11/30/18 0610  . dextrose 5 % 1,000 mL with potassium chloride 40 mEq infusion   Intravenous Continuous Wilhelmina Mcardle, MD 50 mL/hr at 11/30/18 [phone]    . insulin aspart (novoLOG) injection 0-15 Units  0-15 Units Subcutaneous TID WC Wilhelmina Mcardle, MD   5 Units at 11/29/18 1655  . insulin aspart (novoLOG) injection 0-5 Units  0-5 Units Subcutaneous QHS Wilhelmina Mcardle, MD   2 Units at 11/29/18 2142  . insulin glargine (LANTUS) injection 10 Units  10 Units Subcutaneous Daily Wilhelmina Mcardle, MD   10 Units at 11/29/18  41  . lidocaine (XYLOCAINE) 2 % viscous mouth solution 15 mL  15 mL Mouth/Throat Q3H PRN Wilhelmina Mcardle, MD      . multivitamin with minerals tablet 1 tablet  1 tablet Oral Daily Salary, Montell D, MD      . ondansetron (ZOFRAN) injection 4 mg  4 mg Intravenous Q6H PRN Pyreddy, Pavan, MD      . protein supplement (ENSURE MAX) liquid  11 oz Oral BID Salary, Montell D, MD      . pyridostigmine (MESTINON) tablet 60 mg  60 mg Oral Q6H Wilhelmina Mcardle, MD   60 mg at 11/30/18 [phone]  . sodium chloride flush (NS) 0.9 % injection 3 mL  3 mL Intravenous Q12H Saundra Shelling, MD   3 mL at 11/29/18 2143  . sodium chloride flush (NS) 0.9 % injection 3 mL  3 mL Intravenous PRN Pyreddy, Reatha Harps, MD         Objective: Vital signs in last 24 hours: Temp:  [98 F (36.7 C)-99 F (37.2 C)] 98.4 F (36.9 C) (01/23 0526) Pulse Rate:  [60-147] 107  (01/23 0526) Resp:  [15-28] 15 (01/23 0526) BP: (123-152)/(65-85) 146/78 (01/23 0526) SpO2:  [97 %-99 %] 98 % (01/23 0526)  Intake/Output from previous day: 01/22 0701 - 01/23 0700 In: 1290.1 [P.O.:120; I.V.:999.3; IV Piggyback:170.8] Out: 790 [Urine:790] Intake/Output this shift: No intake/output data recorded.   Physical Exam  Constitutional:  Well nourished. Alert and oriented, No acute distress. HEENT: [place] AT, moist mucus membranes.  Trachea midline, no masses. Cardiovascular: No clubbing, cyanosis with mild peripheral edema in lower extremities and hands.   Respiratory: Normal respiratory effort, no increased work of breathing. GI: Abdomen is soft, non tender, non distended, no abdominal masses.   GU: No CVA tenderness.  No bladder fullness or masses.  Patient with uncircumcised phallus.  Foreskin edematous.   Scrotum with mild edema.   Skin: Several bruises on both arms.   Neurologic: Grossly intact, no focal deficits, moving all 4 extremities. Psychiatric: Normal mood and affect.    Lab Results:  Recent Labs    11/29/18 0359 11/30/18 0515  WBC 4.9 6.3  HGB 8.0* 8.3*  HCT 24.8* 24.5*  PLT 36* 44*   BMET Recent Labs    11/29/18 0359 11/30/18 0515  NA 144 139  K 3.5 3.4*  CL 111 106  CO2 29 28  GLUCOSE 227* 256*  BUN 50* 41*  CREATININE 1.03 0.90  CALCIUM 8.8* 8.5*   PT/INR No results for input(s): LABPROT, INR in the last 72 hours. ABG No results for input(s): PHART, HCO3 in the last 72 hours.  Invalid input(s): PCO2, PO2  Studies/Results: No results found.   Assessment and Plan 78 year old male with septic shock related to prostate abscess who is status post TURP on 11/26/2018  - Foley has been discontinued without consulting urology as it would of been recommended to continue Foley to aid in the emptying of the bladder as to prevent infected urine being trapped in the cavernous areas left by the draining of the abscesses - would NOT replace the  Foley at this time as we would want to avoid further manipulation   - continue cefazolin   - will need long term antibiotics (6-8 weeks) after discharge - NO fluoroquinolones as they are not recommended with myasthenia gravis   - advised patient and his wife to continue good fluid intake and to urinate frequently and not to postpone urination as the bladder  needs to be kept as empty as possible to prevent pooling of urine in the cavernous areas in the prostate from the abscesses  - patient will need follow up in one week with urology after discharge   - Patient and wife are advised that upon discharge if he should not be able to urinate, has bright red blood, foul smelling urine, pain with urination, pain in the perineal area, pain in the suprapubic region, vomiting and/or fevers greater than 103 or shaking chills to contact the office immediately or seek treatment in the emergency department for emergent intervention.        LOS: 4 days    [facility] 11/30/2018

## 2018-11-30 NOTE — Care Management Note (Signed)
Case Management Note  Patient Details  Name: Mason Wilson. MRN: [id number] Date of Birth: [birth year]   Patient to discharge today. CMS Medicare.gov Compare Post Acute Care list reviewed with patient and wife.  Patient states he does not have a preference of agency. Referral made to [place] with [facility] RW delivered to room by Corene Cornea from [facility].    Subjective/Objective:                    Action/Plan:   Expected Discharge Date:  11/30/18               Expected Discharge Plan:  [place]  In-House Referral:     Discharge planning Services  CM Consult  Post Acute Care Choice:    Choice offered to:  Patient  DME Arranged:  Walker rolling DME Agency:  [place]:  RN, PT, Nurse's Aide [facility] Agency:  [facility]  Status of Service:  Completed, signed off  If discussed at [place] of Stay Meetings, dates discussed:    Additional Comments:  Beverly Sessions, RN 11/30/2018, 4:53 PM

## 2018-11-30 NOTE — Progress Notes (Signed)
Physical Therapy Treatment Patient Details Name: Mason Wilson. MRN: [id number] DOB: 1941-07-26 Today's Date: 11/30/2018    History of Present Illness Pt is a 78 y.o. male with a known history of hyperlipidemia, hypertension, melanoma, hypogonadism, sleep apnea presented to the emergency room for fever, shaking chills and abdominal discomfort.  Patient also had some nausea and vomiting.  Patient was altered and confused when he presented to the emergency room.  Patient was evaluated in the emergency room he was found to be septic.  Patient was worked up with CT abdomen which showed abscess in the prostate.  Urology consultation was done.  CT scanning of the chest and abdomen also showed pneumonia.  Patient started on broad-spectrum IV antibiotics.  After receiving the fluids secondary to pulmonary edema and respiratory distress he was intubated and put on ventilator.  Patient was started on IV milrinone drip.  Pt s/p cystoscopy with transurethral incision and drainage of prostatic abscess.  MD assessment includes: Ventilator dependent respiratory failure now extubated, severe sepsis/septic shock, prostate abscess s/p I&D, PNA, AKI, mild hypernatremia, ICU acquired anemia, severe thrombocytopenia, and h/o cirrhosis.    PT Comments    Pt presents with min deficits in strength, transfers, mobility, gait, balance, and activity tolerance but overall made excellent progress towards goals this session.  Pt was able to stand from the recliner with significant improvement in eccentric and concentric control as well as stability.  Pt was able to amb 83' with a RW and CGA with slow, cautious cadence but was steady with no signs of LE buckling.  Pt progressed slowly during stair training starting with one step with B rails, then one step with one rail, and finally ascending and descending two steps with close CGA and a SPC.  Pt presented with only minimal instability during stair training with the [facility] that he  was able to correct without assistance.  Per pt, he has narrow stairs at home that will allow him to use a [facility] but also hold the wall/door frame for improved stability along with spouse trained on proper guarding strategy.  Pt will benefit from HHPT services upon discharge to safely address above deficits for decreased caregiver assistance and eventual return to PLOF.     Follow Up Recommendations  Home health PT;Supervision for mobility/OOB     Equipment Recommendations  None recommended by PT;Other (comment)(Pt reports has access to RW, SPC, and elevated toilet)    Recommendations for Other Services       Precautions / Restrictions Precautions Precautions: Fall Restrictions Weight Bearing Restrictions: No    Mobility  Bed Mobility               General bed mobility comments: NT, pt in recliner  Transfers Overall transfer level: Needs assistance Equipment used: Rolling walker (2 wheeled) Transfers: Sit to/from Stand Sit to Stand: From elevated surface;Min guard         General transfer comment: Good control and stability with transfers this session  Ambulation/Gait Ambulation/Gait assistance: Min guard Gait Distance (Feet): 80 Feet Assistive device: Rolling walker (2 wheeled) Gait Pattern/deviations: Step-through pattern;Decreased step length - right;Decreased step length - left Gait velocity: decreased   General Gait Details: Slow, cautious cadence but good stability with no LE buckling   Stairs Stairs: Yes Stairs assistance: Min guard Stair Management: One rail Right;Two rails;With crutches Number of Stairs: 4 General stair comments: Ascend/descend 1 step x 2 with B rails and then one rail; Ascend descend 2 steps without rails  with close CGA and a SPC with only minimal instability that pt was able to correct without assistance   Wheelchair Mobility    Modified Rankin (Stroke Patients Only)       Balance Overall balance assessment: Mild deficits  observed, not formally tested   Sitting balance-Leahy Scale: Normal     Standing balance support: Bilateral upper extremity supported Standing balance-Leahy Scale: Good                              Cognition Arousal/Alertness: Awake/alert Behavior During Therapy: WFL for tasks assessed/performed Overall Cognitive Status: Within Functional Limits for tasks assessed                                        Exercises Total Joint Exercises Long Arc Quad: AROM;Both;10 reps Knee Flexion: AROM;Both;10 reps Marching in Standing: AROM;Both;10 reps;5 reps Other Exercises Other Exercises: Pt/spouse education provided on sequencing and proper gaurding techniques ascending and descending steps Other Exercises: Static standing therex including weight shifting and marching Other Exercises: Multiple sit to/from stand transfer training with min verbal cues for hand placement    General Comments        Pertinent Vitals/Pain Pain Assessment: No/denies pain    Home Living                      Prior Function            PT Goals (current goals can now be found in the care plan section) Progress towards PT goals: Progressing toward goals    Frequency    Min 2X/week      PT Plan Discharge plan needs to be updated    Co-evaluation              AM-PAC PT "6 Clicks" Mobility   Outcome Measure  Help needed turning from your back to your side while in a flat bed without using bedrails?: A Little Help needed moving from lying on your back to sitting on the side of a flat bed without using bedrails?: A Little Help needed moving to and from a bed to a chair (including a wheelchair)?: A Little Help needed standing up from a chair using your arms (e.g., wheelchair or bedside chair)?: A Little Help needed to walk in hospital room?: A Little Help needed climbing 3-5 steps with a railing? : A Little 6 Click Score: 18    End of Session Equipment  Utilized During Treatment: Gait belt Activity Tolerance: Patient tolerated treatment well Patient left: in chair;with family/visitor present;with call bell/phone within reach Nurse Communication: Mobility status PT Visit Diagnosis: Muscle weakness (generalized) (M62.81);Difficulty in walking, not elsewhere classified (R26.2)     Time: 2197-5883 PT Time Calculation (min) (ACUTE ONLY): 40 min  Charges:  $Gait Training: 23-37 mins $Therapeutic Exercise: 8-22 mins                     D. Scott Philis Doke PT, DPT 11/30/18, 1:49 PM

## 2018-12-01 ENCOUNTER — Other Ambulatory Visit: Payer: Medicare Other | Admitting: Urology

## 2018-12-02 DIAGNOSIS — K859 Acute pancreatitis without necrosis or infection, unspecified: Secondary | ICD-10-CM | POA: Diagnosis not present

## 2018-12-02 DIAGNOSIS — Z7984 Long term (current) use of oral hypoglycemic drugs: Secondary | ICD-10-CM | POA: Diagnosis not present

## 2018-12-02 DIAGNOSIS — K7460 Unspecified cirrhosis of liver: Secondary | ICD-10-CM | POA: Diagnosis not present

## 2018-12-02 DIAGNOSIS — Z792 Long term (current) use of antibiotics: Secondary | ICD-10-CM | POA: Diagnosis not present

## 2018-12-02 DIAGNOSIS — N400 Enlarged prostate without lower urinary tract symptoms: Secondary | ICD-10-CM | POA: Diagnosis not present

## 2018-12-02 DIAGNOSIS — M5136 Other intervertebral disc degeneration, lumbar region: Secondary | ICD-10-CM | POA: Diagnosis not present

## 2018-12-02 DIAGNOSIS — M1990 Unspecified osteoarthritis, unspecified site: Secondary | ICD-10-CM | POA: Diagnosis not present

## 2018-12-02 DIAGNOSIS — Z48816 Encounter for surgical aftercare following surgery on the genitourinary system: Secondary | ICD-10-CM | POA: Diagnosis not present

## 2018-12-02 DIAGNOSIS — K219 Gastro-esophageal reflux disease without esophagitis: Secondary | ICD-10-CM | POA: Diagnosis not present

## 2018-12-02 DIAGNOSIS — G7000 Myasthenia gravis without (acute) exacerbation: Secondary | ICD-10-CM | POA: Diagnosis not present

## 2018-12-02 DIAGNOSIS — E1122 Type 2 diabetes mellitus with diabetic chronic kidney disease: Secondary | ICD-10-CM | POA: Diagnosis not present

## 2018-12-02 DIAGNOSIS — Z87891 Personal history of nicotine dependence: Secondary | ICD-10-CM | POA: Diagnosis not present

## 2018-12-02 DIAGNOSIS — B9620 Unspecified Escherichia coli [E. coli] as the cause of diseases classified elsewhere: Secondary | ICD-10-CM | POA: Diagnosis not present

## 2018-12-02 DIAGNOSIS — E291 Testicular hypofunction: Secondary | ICD-10-CM | POA: Diagnosis not present

## 2018-12-02 DIAGNOSIS — D500 Iron deficiency anemia secondary to blood loss (chronic): Secondary | ICD-10-CM | POA: Diagnosis not present

## 2018-12-02 DIAGNOSIS — N410 Acute prostatitis: Secondary | ICD-10-CM | POA: Diagnosis not present

## 2018-12-02 DIAGNOSIS — N412 Abscess of prostate: Secondary | ICD-10-CM | POA: Diagnosis not present

## 2018-12-02 DIAGNOSIS — I2510 Atherosclerotic heart disease of native coronary artery without angina pectoris: Secondary | ICD-10-CM | POA: Diagnosis not present

## 2018-12-02 DIAGNOSIS — N390 Urinary tract infection, site not specified: Secondary | ICD-10-CM | POA: Diagnosis not present

## 2018-12-02 DIAGNOSIS — I129 Hypertensive chronic kidney disease with stage 1 through stage 4 chronic kidney disease, or unspecified chronic kidney disease: Secondary | ICD-10-CM | POA: Diagnosis not present

## 2018-12-02 DIAGNOSIS — G4730 Sleep apnea, unspecified: Secondary | ICD-10-CM | POA: Diagnosis not present

## 2018-12-02 DIAGNOSIS — B961 Klebsiella pneumoniae [K. pneumoniae] as the cause of diseases classified elsewhere: Secondary | ICD-10-CM | POA: Diagnosis not present

## 2018-12-02 DIAGNOSIS — J189 Pneumonia, unspecified organism: Secondary | ICD-10-CM | POA: Diagnosis not present

## 2018-12-02 DIAGNOSIS — N183 Chronic kidney disease, stage 3 (moderate): Secondary | ICD-10-CM | POA: Diagnosis not present

## 2018-12-04 ENCOUNTER — Telehealth: Payer: Self-pay | Admitting: Family Medicine

## 2018-12-04 NOTE — Addendum Note (Signed)
Addendum  created 12/04/18 1010 by Doreen Salvage, CRNA   Charge Capture section accepted

## 2018-12-04 NOTE — Telephone Encounter (Signed)
Copied from [place]. Topic: Quick Communication - Home Health Verbal Orders >> Dec 04, 2018  3:54 PM Bea Graff, NT wrote: Caller/AgencyPaulette Blanch Number: [phone] ext [phone] Requesting OT/PT/Skilled Nursing/Social Work: PT  Frequency: 1 time a week for 1 week and 2 times a week for 2 weeks.

## 2018-12-04 NOTE — Telephone Encounter (Signed)
ok 

## 2018-12-05 ENCOUNTER — Telehealth: Payer: Self-pay | Admitting: Urology

## 2018-12-05 NOTE — Telephone Encounter (Signed)
Called and let Carmell Austria know that Dr. Jeananne Rama gave the ok for verbal orders.

## 2018-12-05 NOTE — Telephone Encounter (Signed)
Spoke with Mason Wilson and he is doing well.

## 2018-12-11 DIAGNOSIS — B9620 Unspecified Escherichia coli [E. coli] as the cause of diseases classified elsewhere: Secondary | ICD-10-CM | POA: Diagnosis not present

## 2018-12-11 DIAGNOSIS — N390 Urinary tract infection, site not specified: Secondary | ICD-10-CM | POA: Diagnosis not present

## 2018-12-11 DIAGNOSIS — E1122 Type 2 diabetes mellitus with diabetic chronic kidney disease: Secondary | ICD-10-CM | POA: Diagnosis not present

## 2018-12-11 DIAGNOSIS — N412 Abscess of prostate: Secondary | ICD-10-CM | POA: Diagnosis not present

## 2018-12-11 DIAGNOSIS — N410 Acute prostatitis: Secondary | ICD-10-CM | POA: Diagnosis not present

## 2018-12-11 DIAGNOSIS — B961 Klebsiella pneumoniae [K. pneumoniae] as the cause of diseases classified elsewhere: Secondary | ICD-10-CM | POA: Diagnosis not present

## 2018-12-11 NOTE — Discharge Summary (Signed)
[facility] NAME: Mason Wilson    MR#:  [id number]  DATE OF BIRTH:  12-26-1940  DATE OF ADMISSION:  11/26/2018 ADMITTING PHYSICIAN: Saundra Shelling, MD  DATE OF DISCHARGE: 12/11/2018   PRIMARY CARE PHYSICIAN: [name], MD    ADMISSION DIAGNOSIS:  Acute prostatitis [N41.0] Prostate abscess [N41.2] Myasthenia gravis ([place]) [G70.00] Septic shock ([place]) [A41.9, R65.21] Acute pancreatitis without infection or necrosis, unspecified pancreatitis type [K85.90] Pneumonia of both lungs due to infectious organism, unspecified part of lung [J18.9]  DISCHARGE DIAGNOSIS:  Active Problems:   Septic shock ([place])   Prostate abscess   SECONDARY DIAGNOSIS:   Past Medical History:  Diagnosis Date  . Allergy   . Arthritis    "everywhere' - worse in back  . Body mass index 39.0-39.9, adult   . CAD (coronary artery disease)   . Chronic back pain   . Cirrhosis, non-alcoholic ([place])   . Diabetes mellitus without complication ([place])   . Elevated liver enzymes   . GERD (gastroesophageal reflux disease)   . Heart murmur   . Hyperlipidemia   . Hypertension   . Hypogonadism in male   . Melanoma ([place])    melanoma   . Melanoma in situ of right shoulder ([place])   . Myasthenia gravis ([place]) 01/2018  . Renal stones   . Sleep apnea    CPAP  . Uses hearing aid    doesn't wear    HOSPITAL COURSE:   78 year old male patient with history of hypertension, hyperlipidemia, melanoma, hypogonadism currently in the ICU for fever, chills, nausea abdominal discomfort  *Acute septic shock Resolving Successfully weaned off pressor support, continue IV Ancef   *Acute sepsis from E. coli bacteremia and Enterobacteriaceae Antibiotics per above, sensitivities noted For prostate abscess will need antibiotics for up to 6 weeks per urology.  Change to oral per sensitivity report.  *Acute hypoxic respiratory failure Resolving  status post  extubation  *Acute prostatic abscess Resolving S/p TURP  Continue IV Ancef Urology input appreciated  *Acute E. coli and Klebsiella UTI Initially treated with meropenem, changed to Ancef based on sensitivities  *Acute pneumonia  Resolving Continue pneumonia protocol, antibiotics per above   *Acute pancreatitis without necrosis Stable  *Pancytopenia  Suspect due to sepsis  Avoid blood thinner medication  *Chronic Myasthenia gravis Stable Continue oral pyridostigmine Supportive care  DISCHARGE CONDITIONS:   Stable.  CONSULTS OBTAINED:  Treatment Team:  Irine Seal, MD Rosine Door, MD  DRUG ALLERGIES:   Allergies  Allergen Reactions  . Altace [Ramipril] Cough    DISCHARGE MEDICATIONS:   Allergies as of 11/30/2018      Reactions   Altace [ramipril] Cough      Medication List    STOP taking these medications   cephALEXin 500 MG capsule Commonly known as:  KEFLEX   hydrochlorothiazide 25 MG tablet Commonly known as:  HYDRODIURIL   predniSONE 20 MG tablet Commonly known as:  DELTASONE   sulfamethoxazole-trimethoprim 800-160 MG tablet Commonly known as:  BACTRIM DS,SEPTRA DS     TAKE these medications   acetaminophen 325 MG tablet Commonly known as:  TYLENOL Place 2 tablets (650 mg total) into feeding tube every 6 (six) hours as needed for mild pain (or Fever >/= 101).   CALCIUM 1200 PO Take 1,200 mg by mouth.   cefUROXime 250 MG tablet Commonly known as:  CEFTIN Take 1 tablet (250 mg total) by mouth 2 (two) times daily.  glipiZIDE 5 MG tablet Commonly known as:  GLUCOTROL Take 1 tablet (5 mg total) by mouth daily before breakfast.   losartan 100 MG tablet Commonly known as:  COZAAR Take 1 tablet (100 mg total) by mouth daily.   metFORMIN 500 MG tablet Commonly known as:  GLUCOPHAGE Take 2 tablets (1,000 mg total) by mouth 2 (two) times daily with a meal.   metoprolol succinate 50 MG 24 hr tablet Commonly known as:   TOPROL-XL Take 1 tablet (50 mg total) by mouth daily. Take with or immediately following a meal.   multivitamin with minerals Tabs tablet Take 1 tablet by mouth daily.   omeprazole 40 MG capsule Commonly known as:  PRILOSEC Take 40 mg by mouth daily.   pyridostigmine 60 MG tablet Commonly known as:  MESTINON Take 60 mg by mouth 4 (four) times daily.   simvastatin 40 MG tablet Commonly known as:  ZOCOR Take 1 tablet (40 mg total) by mouth daily at 6 PM.   tamsulosin 0.4 MG Caps capsule Commonly known as:  FLOMAX TAKE 1 CAPSULE(0.4 MG) BY MOUTH DAILY        DISCHARGE INSTRUCTIONS:    Follow with urology clinic in 1-2 weeks.  If you experience worsening of your admission symptoms, develop shortness of breath, life threatening emergency, suicidal or homicidal thoughts you must seek medical attention immediately by calling 911 or calling your MD immediately  if symptoms less severe.  You Must read complete instructions/literature along with all the possible adverse reactions/side effects for all the Medicines you take and that have been prescribed to you. Take any new Medicines after you have completely understood and accept all the possible adverse reactions/side effects.   Please note  You were cared for by a hospitalist during your hospital stay. If you have any questions about your discharge medications or the care you received while you were in the hospital after you are discharged, you can call the unit and asked to speak with the hospitalist on call if the hospitalist that took care of you is not available. Once you are discharged, your primary care physician will handle any further medical issues. Please note that NO REFILLS for any discharge medications will be authorized once you are discharged, as it is imperative that you return to your primary care physician (or establish a relationship with a primary care physician if you do not have one) for your aftercare needs so that  they can reassess your need for medications and monitor your lab values.    Today   CHIEF COMPLAINT:   Chief Complaint  Patient presents with  . Shortness of Breath  . Nausea  . Diarrhea  . Rectal Bleeding    HISTORY OF PRESENT ILLNESS:  Mason Wilson  is a 78 y.o. male with a known history of hyperlipidemia, hypertension, melanoma, hypogonadism, sleep apnea presented to the emergency room for fever, shaking chills and abdominal discomfort.  Patient also had some nausea and vomiting.  Patient was altered and confused when he presented to the emergency room.  History of urinary tract infections in the past and recently completed a course of antibiotics that is Bactrim and Keflex.  Patient was evaluated in the emergency room he was found to be septic.  He was given 4 L IV fluids.  Patient was worked up with CT abdomen which showed abscess in the prostate.  Urology consultation was done.  CT scanning of the chest and abdomen also showed pneumonia.  Patient started on  broad-spectrum IV antibiotics.  After receiving the fluids secondary to pulmonary edema and respiratory distress he was intubated and put on ventilator.  Patient was started on IV milrinone drip.  Hospitalist service was consulted.   VITAL SIGNS:  Blood pressure (!) 152/75, pulse (!) 101, temperature 98.6 F (37 C), temperature source Oral, resp. rate 20, height 5\' 11"  (1.803 m), weight 109.8 kg, SpO2 98 %.  I/O:   No intake or output data in the 24 hours ending 12/11/18 0753  PHYSICAL EXAMINATION:  GENERAL:  78 y.o.-year-old patient lying in the bed with no acute distress.  EYES: Pupils equal, round, reactive to light and accommodation. No scleral icterus. Extraocular muscles intact.  HEENT: Head atraumatic, normocephalic. Oropharynx and nasopharynx clear.  NECK:  Supple, no jugular venous distention. No thyroid enlargement, no tenderness.  LUNGS: Normal breath sounds bilaterally, no wheezing, rales,rhonchi or crepitation.  No use of accessory muscles of respiration.  CARDIOVASCULAR: S1, S2 normal. No murmurs, rubs, or gallops.  ABDOMEN: Soft, non-tender, non-distended. Bowel sounds present. No organomegaly or mass.  EXTREMITIES: No pedal edema, cyanosis, or clubbing.  NEUROLOGIC: Cranial nerves II through XII are intact. Muscle strength 4/5 in all extremities. Sensation intact. Gait not checked.  PSYCHIATRIC: The patient is alert and oriented x 3.  SKIN: No obvious rash, lesion, or ulcer.   DATA REVIEW:   CBC No results for input(s): WBC, HGB, HCT, PLT in the last 168 hours.  Chemistries  No results for input(s): NA, K, CL, CO2, GLUCOSE, BUN, CREATININE, CALCIUM, MG, AST, ALT, ALKPHOS, BILITOT in the last 168 hours.  Invalid input(s): GFRCGP  Cardiac Enzymes No results for input(s): TROPONINI in the last 168 hours.  Microbiology Results  Results for orders placed or performed during the hospital encounter of 11/26/18  Culture, blood (routine x 2)     Status: Abnormal   Collection Time: 11/26/18  3:45 AM  Result Value Ref Range Status   Specimen Description   Final    BLOOD LEFT HAND Performed at [facility], [address], [place], [place] [address]    Special Requests   Final    BOTTLES DRAWN AEROBIC AND ANAEROBIC Blood Culture results may not be optimal due to an excessive volume of blood received in culture bottles Performed at [facility], [address] [place], [place] [address]    Culture  Setup Time   Final    IN BOTH AEROBIC AND ANAEROBIC BOTTLES GRAM NEGATIVE RODS RESULT CALLED TO, READ BACK BY AND VERIFIED WITH: RODNEY GRUBB AT 3662 11/26/18.MSS Performed at [facility], [place] [address], [place], [place] [address]    Culture ESCHERICHIA COLI (A)  Final   Report Status 11/29/2018 FINAL  Final   Organism ID, Bacteria ESCHERICHIA COLI  Final      Susceptibility   Escherichia coli - MIC*    AMPICILLIN >=32 RESISTANT Resistant     CEFAZOLIN <=4 SENSITIVE  Sensitive     CEFEPIME <=1 SENSITIVE Sensitive     CEFTAZIDIME <=1 SENSITIVE Sensitive     CEFTRIAXONE <=1 SENSITIVE Sensitive     CIPROFLOXACIN <=0.25 SENSITIVE Sensitive     GENTAMICIN <=1 SENSITIVE Sensitive     IMIPENEM <=0.25 SENSITIVE Sensitive     TRIMETH/SULFA <=20 SENSITIVE Sensitive     AMPICILLIN/SULBACTAM >=32 RESISTANT Resistant     PIP/TAZO <=4 SENSITIVE Sensitive     Extended ESBL NEGATIVE Sensitive     * ESCHERICHIA COLI  Culture, blood (routine x 2)  Status: Abnormal   Collection Time: 11/26/18  3:45 AM  Result Value Ref Range Status   Specimen Description   Final    BLOOD LEFT ARM Performed at [facility], [address] [place], [place] [address]    Special Requests   Final    BOTTLES DRAWN AEROBIC AND ANAEROBIC Blood Culture results may not be optimal due to an excessive volume of blood received in culture bottles Performed at [facility] [address], [place], [place] [address]    Culture  Setup Time   Final    GRAM NEGATIVE RODS IN BOTH AEROBIC AND ANAEROBIC BOTTLES CRITICAL VALUE NOTED.  VALUE IS CONSISTENT WITH PREVIOUSLY REPORTED AND CALLED VALUE. GRAM STAIN REVIEWED-AGREE WITH RESULT T. TYSOR    Culture (A)  Final    ESCHERICHIA COLI SUSCEPTIBILITIES PERFORMED ON PREVIOUS CULTURE WITHIN THE LAST 5 DAYS. Performed at [facility], [place] [address], [place], [place] [address]    Report Status 11/29/2018 FINAL  Final  Blood Culture ID Panel (Reflexed)     Status: Abnormal   Collection Time: 11/26/18  3:45 AM  Result Value Ref Range Status   Enterococcus species NOT DETECTED NOT DETECTED Final   Listeria monocytogenes NOT DETECTED NOT DETECTED Final   Staphylococcus species NOT DETECTED NOT DETECTED Final   Staphylococcus aureus (BCID) NOT DETECTED NOT DETECTED Final   Streptococcus species NOT DETECTED NOT DETECTED Final   Streptococcus agalactiae NOT DETECTED NOT DETECTED Final   Streptococcus pneumoniae NOT DETECTED  NOT DETECTED Final   Streptococcus pyogenes NOT DETECTED NOT DETECTED Final   Acinetobacter baumannii NOT DETECTED NOT DETECTED Final   Enterobacteriaceae species DETECTED (A) NOT DETECTED Final    Comment: Enterobacteriaceae represent a large family of gram-negative bacteria, not a single organism. RESULT CALLED TO, READ BACK BY AND VERIFIED WITH: RODNEY GRUBB AT 3149 11/26/18. MSS    Enterobacter cloacae complex NOT DETECTED NOT DETECTED Final   Escherichia coli DETECTED (A) NOT DETECTED Final    Comment: RESULT CALLED TO, READ BACK BY AND VERIFIED WITH: RODNEY GRUBB AT 7026 11/26/18. MSS    Klebsiella oxytoca NOT DETECTED NOT DETECTED Final   Klebsiella pneumoniae NOT DETECTED NOT DETECTED Final   Proteus species NOT DETECTED NOT DETECTED Final   Serratia marcescens NOT DETECTED NOT DETECTED Final   Carbapenem resistance NOT DETECTED NOT DETECTED Final   Haemophilus influenzae NOT DETECTED NOT DETECTED Final   Neisseria meningitidis NOT DETECTED NOT DETECTED Final   Pseudomonas aeruginosa NOT DETECTED NOT DETECTED Final   Candida albicans NOT DETECTED NOT DETECTED Final   Candida glabrata NOT DETECTED NOT DETECTED Final   Candida krusei NOT DETECTED NOT DETECTED Final   Candida parapsilosis NOT DETECTED NOT DETECTED Final   Candida tropicalis NOT DETECTED NOT DETECTED Final    Comment: Performed at [facility], [address], [place], [place] [address]  Urine culture     Status: Abnormal   Collection Time: 11/26/18  3:52 AM  Result Value Ref Range Status   Specimen Description   Final    URINE, RANDOM Performed at [facility], [address], [place], [place] [address]    Special Requests   Final    NONE Performed at [facility], [address] [place], [place] [address]    Culture (A)  Final    >=100,000 COLONIES/mL ESCHERICHIA COLI 40,000 COLONIES/mL KLEBSIELLA PNEUMONIAE    Report Status 11/29/2018 FINAL  Final   Organism ID, Bacteria  ESCHERICHIA  COLI (A)  Final   Organism ID, Bacteria KLEBSIELLA PNEUMONIAE (A)  Final      Susceptibility   Escherichia coli - MIC*    AMPICILLIN >=32 RESISTANT Resistant     CEFAZOLIN <=4 SENSITIVE Sensitive     CEFTRIAXONE <=1 SENSITIVE Sensitive     CIPROFLOXACIN <=0.25 SENSITIVE Sensitive     GENTAMICIN <=1 SENSITIVE Sensitive     IMIPENEM <=0.25 SENSITIVE Sensitive     NITROFURANTOIN <=16 SENSITIVE Sensitive     TRIMETH/SULFA <=20 SENSITIVE Sensitive     AMPICILLIN/SULBACTAM >=32 RESISTANT Resistant     PIP/TAZO <=4 SENSITIVE Sensitive     Extended ESBL NEGATIVE Sensitive     * >=100,000 COLONIES/mL ESCHERICHIA COLI   Klebsiella pneumoniae - MIC*    AMPICILLIN >=32 RESISTANT Resistant     CEFAZOLIN <=4 SENSITIVE Sensitive     CEFTRIAXONE <=1 SENSITIVE Sensitive     CIPROFLOXACIN <=0.25 SENSITIVE Sensitive     GENTAMICIN <=1 SENSITIVE Sensitive     IMIPENEM <=0.25 SENSITIVE Sensitive     NITROFURANTOIN 64 INTERMEDIATE Intermediate     TRIMETH/SULFA >=320 RESISTANT Resistant     AMPICILLIN/SULBACTAM 4 SENSITIVE Sensitive     PIP/TAZO <=4 SENSITIVE Sensitive     Extended ESBL NEGATIVE Sensitive     * 40,000 COLONIES/mL KLEBSIELLA PNEUMONIAE  MRSA PCR Screening     Status: None   Collection Time: 11/26/18  2:21 PM  Result Value Ref Range Status   MRSA by PCR NEGATIVE NEGATIVE Final    Comment:        The GeneXpert MRSA Assay (FDA approved for NASAL specimens only), is one component of a comprehensive MRSA colonization surveillance program. It is not intended to diagnose MRSA infection nor to guide or monitor treatment for MRSA infections. Performed at [facility], [address] [place], [place] [address]     RADIOLOGY:  No results found.  EKG:   Orders placed or performed during the hospital encounter of 11/26/18  . EKG 12-Lead  . EKG 12-Lead      Management plans discussed with the patient, family and they are in agreement.  CODE STATUS:      Code Status Orders  (From admission, onward)         Start     Ordered   11/26/18 1143  Full code  Continuous     11/26/18 1142        Code Status History    Date Active Date Inactive Code Status Order ID Comments User Context   06/23/2016 1427 06/25/2016 1722 Full Code [id number]  Epifanio Lesches, MD ED      TOTAL TIME TAKING CARE OF THIS PATIENT: 35 minutes.    Vaughan Basta M.D on 12/11/2018 at 7:53 AM  Between 7am to 6pm - Pager - [phone]  After 6pm go to [email or web address] - password [email or web address] [facility]  Office  [phone]  CC: Primary care physician; [name], MD   Note: This dictation was prepared with Dragon dictation along with smaller phrase technology. Any transcriptional errors that result from this process are unintentional.

## 2018-12-14 NOTE — Progress Notes (Signed)
12/15/2018 8:37 AM   Mason Wilson. 1941-09-19 [id number]  Referring provider: [name], MD [address] [place], [place] [address]  Chief Complaint  Patient presents with  . Hospitalization Follow-up    HPI: Mason Wilson. is a 78 y.o. male Caucasian with a history of UTI, BPH with LU TS, bilateral nephrolithiasis, ED, and acute septic shock caused by a prostatic abscess which was treated by TURP, who presents today to follow up from his 11/26/2018 hospital visit with his wife, Mason Wilson.  On 11/26/2018, patient presented in the ICU experiencing acute septic shock due to an a prostatic abscess which was treated by TURP.  Patient had sepsis from E. Coli bacteremia and Enterobacteriaceae, and was also diagnosed with an acute E. Coli and Klebsiella UTI.  When he presented on 12/15/2018, he was in urinary retention with 419 mL retained.  He reports that he has not had any urge to urinate since he left the hospital and his wife says he has only occasionally voiding well.  He says he does not feel an urge to void, though he denies straining to urinate.  However, he has been having diarrhea and he has been passing blood rectally; he admits that he has been bleeding rectally occasionally for months prior to this, and a colonoscopy in 06/2018 revealed rectal varacies though it was not bleeding at the time of the colonoscopy.  He says he feels swollen and that everything is swollen, and has been bleeding rectally regularly now.  Patient had unfortunately had his previous catheter taken out too early, and without the balloon being properly deflated.  History of rUTI's Risk factors: age, DM, fecal incontinence, limited water intake, sugary drinks and myasthenia gravis.    + E.coli resistant to ampicillin on 10/21/2017 + E.coli resistant to ampicillin on 11/02/2017 + E.coli resistant to ampicillin on 09/19/2018 Symptoms with an UTI: increase nocturia, dysuria, hight fever (103.2),  malaise and urinating little amounts PVR's minimal  RUS on 10/09/2018 revealed No acute findings.  No hydronephrosis.  No visualized intrarenal stones.  2.8 cm lower pole right renal cyst.  No other masses.  He was drinking mostly diet cranberry juice, sweet tea, milk with cereal, three Pepsi zeros weekly, no coffee and no alcohol.  Little water.  He was also having ice cream at night.  Cystoscopy performed during TURP on 11/26/2018 found: Examination revealed a normal urethra.  The external sphincter was intact.  The prostatic urethra was approximately 4 to 5 cm in length with bilobar hyperplasia with some degree of obstruction and a small middle lobe.  Examination of the bladder revealed moderate trabeculation with marked inflammatory changes.  Ureteral orifice ease were unremarkable.  BPH WITH LUTS  (prostate and/or bladder) IPSS score: 23/4 on 09/25/2018.  Previous score: 12/5   Previous PVR: 74 mL Major complaint(s): Urgency, urge incontinence, fecal incontinence and nocturia x several years. Denies any dysuria, hematuria or suprapubic pain.  Currently taking: tamsulosin 0.4 mg daily   Denies any recent fevers, chills, nausea or vomiting.  Risk factors for nocturia: obstructive sleep apnea, diabetes, heart disease, anxiety, depression and BPH.   Patient has been advised to eliminate dairy from diet and seek further care from GI for the fecal incontinence.  Bilateral nephrolithiasis Tiny punctate stones bilaterally on CT in 2016.  He has not had any flank pain or passage of fragments.    Erectile dysfunction Trimix has not been effective.  As of 10/16/2018, not interested in sex  at this time.  Has no energy due to iron issues.  PMH: Past Medical History:  Diagnosis Date  . Allergy   . Arthritis    "everywhere' - worse in back  . Body mass index 39.0-39.9, adult   . CAD (coronary artery disease)   . Chronic back pain   . Cirrhosis, non-alcoholic ([place])   . Diabetes mellitus without  complication ([place])   . Elevated liver enzymes   . GERD (gastroesophageal reflux disease)   . Heart murmur   . Hyperlipidemia   . Hypertension   . Hypogonadism in male   . Melanoma ([place])    melanoma   . Melanoma in situ of right shoulder ([place])   . Myasthenia gravis ([place]) 01/2018  . Renal stones   . Sleep apnea    CPAP  . Uses hearing aid    doesn't wear    Surgical History: Past Surgical History:  Procedure Laterality Date  . ANGIOPLASTY  1999   3 stents  . CARDIAC CATHETERIZATION     stents placed  . CARPAL TUNNEL RELEASE     x2  . CATARACT EXTRACTION, BILATERAL    . COLONOSCOPY WITH PROPOFOL N/A 07/03/2018   Procedure: COLONOSCOPY WITH PROPOFOL;  Surgeon: Lucilla Lame, MD;  Location: [facility];  Service: Endoscopy;  Laterality: N/A;  . ESOPHAGOGASTRODUODENOSCOPY (EGD) WITH PROPOFOL N/A 09/08/2015   Procedure: ESOPHAGOGASTRODUODENOSCOPY (EGD) WITH PROPOFOL;  Surgeon: Lucilla Lame, MD;  Location: [facility];  Service: Endoscopy;  Laterality: N/A;  CPAP Diabetic - oral meds  . ESOPHAGOGASTRODUODENOSCOPY (EGD) WITH PROPOFOL N/A 07/03/2018   Procedure: ESOPHAGOGASTRODUODENOSCOPY (EGD) WITH PROPOFOL;  Surgeon: Lucilla Lame, MD;  Location: [facility];  Service: Endoscopy;  Laterality: N/A;  diabetic - oral meds sleep apnea  . LIVER BIOPSY    . POLYPECTOMY N/A 07/03/2018   Procedure: POLYPECTOMY;  Surgeon: Lucilla Lame, MD;  Location: [facility];  Service: Endoscopy;  Laterality: N/A;  . SKIN LESION EXCISION  1998  . SKIN LESION EXCISION     skin cancer removed on forehead in June 2018  . TONSILLECTOMY     age 62  . TRANSURETHRAL RESECTION OF PROSTATE N/A 11/26/2018   Procedure: TRANSURETHRAL RESECTION OF THE PROSTATE (TURP);  Surgeon: Irine Seal, MD;  Location: [facility];  Service: Urology;  Laterality: N/A;  . TRANSURETHRAL RESECTION OF PROSTATE N/A 12/17/2018   Procedure: TRANSURETHRAL RESECTION OF THE PROSTATE (TURP),cystoscopy,clot evacuation;   Surgeon: Lucas Mallow, MD;  Location: [facility];  Service: Urology;  Laterality: N/A;    Home Medications:  Allergies as of 12/15/2018      Reactions   Altace [ramipril] Cough      Medication List       Accurate as of December 15, 2018 12:59 PM. Always use your most recent med list.        acetaminophen 325 MG tablet Commonly known as:  TYLENOL Place 2 tablets (650 mg total) into feeding tube every 6 (six) hours as needed for mild pain (or Fever >/= 101).   CALCIUM 1200 PO Take 1,200 mg by mouth.   cefUROXime 250 MG tablet Commonly known as:  CEFTIN Take 1 tablet (250 mg total) by mouth 2 (two) times daily.   glipiZIDE 5 MG tablet Commonly known as:  GLUCOTROL Take 1 tablet (5 mg total) by mouth daily before breakfast.   losartan 100 MG tablet Commonly known as:  COZAAR Take 1 tablet (100 mg total) by mouth daily.   metFORMIN 500  MG tablet Commonly known as:  GLUCOPHAGE Take 2 tablets (1,000 mg total) by mouth 2 (two) times daily with a meal.   metoprolol succinate 50 MG 24 hr tablet Commonly known as:  TOPROL-XL Take 1 tablet (50 mg total) by mouth daily. Take with or immediately following a meal.   multivitamin with minerals Tabs tablet Take 1 tablet by mouth daily.   omeprazole 40 MG capsule Commonly known as:  PRILOSEC Take 40 mg by mouth daily.   pyridostigmine 60 MG tablet Commonly known as:  MESTINON Take 60 mg by mouth 4 (four) times daily.   simvastatin 40 MG tablet Commonly known as:  ZOCOR Take 1 tablet (40 mg total) by mouth daily at 6 PM.   tamsulosin 0.4 MG Caps capsule Commonly known as:  FLOMAX TAKE 1 CAPSULE(0.4 MG) BY MOUTH DAILY       Allergies:  Allergies  Allergen Reactions  . Altace [Ramipril] Cough    Family History: Family History  Problem Relation Age of Onset  . Thyroid disease Mother   . Alzheimer's disease Mother   . Heart disease Mother   . Cancer Father 84       testicular  . Heart disease Father   .  Hypertension Father   . Heart attack Father   . Kidney disease Neg Hx   . Prostate cancer Neg Hx   . Kidney cancer Neg Hx   . Bladder Cancer Neg Hx     Social History:  reports that he quit smoking about 48 years ago. His smoking use included cigarettes. His smokeless tobacco use includes chew. He reports that he does not drink alcohol or use drugs.  ROS: UROLOGY Frequent Urination?: No Hard to postpone urination?: No Burning/pain with urination?: No Get up at night to urinate?: No Leakage of urine?: No Urine stream starts and stops?: No Trouble starting stream?: No Do you have to strain to urinate?: No Blood in urine?: No Urinary tract infection?: No Sexually transmitted disease?: No Injury to kidneys or bladder?: No Painful intercourse?: No Weak stream?: No Erection problems?: No Penile pain?: Yes  Gastrointestinal Nausea?: No Vomiting?: No Indigestion/heartburn?: No Diarrhea?: Yes Constipation?: No  Constitutional Fever: No Night sweats?: No Weight loss?: No Fatigue?: No  Skin Skin rash/lesions?: No Itching?: No  Eyes Blurred vision?: No Double vision?: No  Ears/Nose/Throat Sore throat?: No Sinus problems?: No  Hematologic/Lymphatic Swollen glands?: No Easy bruising?: Yes  Cardiovascular Leg swelling?: Yes Chest pain?: No  Respiratory Cough?: No Shortness of breath?: Yes  Endocrine Excessive thirst?: No  Musculoskeletal Back pain?: No Joint pain?: No  Neurological Headaches?: No Dizziness?: No  Psychologic Depression?: No Anxiety?: No  Physical Exam: BP 102/66 (BP Location: Left Arm, Patient Position: Sitting)   Pulse (!) 137   Temp 98.2 F (36.8 C)   Ht 5\' 9"  (1.753 m)   Wt 258 lb 14.4 oz (117.4 kg)   BMI 38.23 kg/m   Constitutional:  Well nourished. Alert and oriented, No acute distress. HEENT: [place] AT, moist mucus membranes.  Trachea midline. Cardiovascular: No clubbing, cyanosis, or edema. Respiratory: Normal  respiratory effort, no increased work of breathing. GI: Abdomen is soft, tender, distended, no abdominal masses.   Umbilical hernia is noted.   GU: No CVA tenderness.  No bladder fullness or masses.  Patient with uncircumcised phallus. Foreskin edematous.  Could not be retracted.  No penile discharge. No penile lesions or rashes. Scrotum with edema.  Testicles are located scrotally bilaterally. No masses are appreciated  in the testicles. Left and right epididymis are normal Rectal: Not performed.   Skin: No rashes, bruises or suspicious lesions. Lymph: No inguinal adenopathy Neurologic: Grossly intact, no focal deficits, moving all 4 extremities. Psychiatric: Normal mood and affect.  Laboratory Data: PSA History  1.6 ng/mL in 07/2015  1.8 ng/mL in 07/2016  1.3 ng/mL in 07/2017  2.1 ng/mL in 07/2018  Lab Results  Component Value Date   CREATININE 1.10 12/18/2018       Component Value Date/Time   CHOL 142 09/05/2018 1057   CHOL 104 03/02/2018 0902   HDL 46 09/05/2018 1057   CHOLHDL 3.1 09/05/2018 1057   VLDL 18 03/02/2018 0902   LDLCALC 73 09/05/2018 1057    Lab Results  Component Value Date   AST 24 12/17/2018   Lab Results  Component Value Date   ALT 17 12/17/2018   I have reviewed the labs  Pertinent Imaging  CLINICAL DATA:  Sepsis. Dyspnea. Nausea. Diarrhea. Abdominal distention.  EXAM: CT CHEST, ABDOMEN, AND PELVIS WITH CONTRAST  TECHNIQUE: Multidetector CT imaging of the chest, abdomen and pelvis was performed following the standard protocol during bolus administration of intravenous contrast.  CONTRAST:  157mL OMNIPAQUE IOHEXOL 300 MG/ML  SOLN  COMPARISON:  35019 chest CT.  08/01/2015 CT abdomen/pelvis.  FINDINGS: CT CHEST FINDINGS  Cardiovascular: Top-normal heart size. No significant pericardial effusion/thickening. 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 Atherosclerotic nonaneurysmal thoracic aorta. Top-normal caliber main  pulmonary artery (3.4 cm diameter). No central pulmonary emboli.  Mediastinum/Nodes: No discrete thyroid nodules. Unremarkable esophagus. No pathologically enlarged axillary, mediastinal or hilar lymph nodes.  Lungs/Pleura: No pneumothorax. No pleural effusion. There is relatively symmetric patchy parahilar consolidation and ground-glass opacity in both lungs, upper lobe predominant. No lung masses.  Musculoskeletal: No aggressive appearing focal osseous lesions. Moderate thoracic spondylosis.  CT ABDOMEN PELVIS FINDINGS  Hepatobiliary: Diffusely irregular liver surface compatible with hepatic cirrhosis. No liver masses. Normal gallbladder with no radiopaque cholelithiasis. No biliary ductal dilatation.  Pancreas: There is diffuse haziness of the peripancreatic fat extending into anterior paranephric retroperitoneal spaces bilaterally, suggestive of acute pancreatitis. No peripancreatic fluid collections. Preserved pancreatic parenchymal enhancement. No pancreatic mass or duct dilation.  Spleen: Mild to moderate splenomegaly with craniocaudal splenic length 16.2 cm. No splenic mass.  Adrenals/Urinary Tract: Normal adrenals. No hydronephrosis. Simple 2.3 cm lateral lower right renal cyst. Normal bladder.  Stomach/Bowel: Normal non-distended stomach. Normal caliber small bowel with no small bowel wall thickening. Normal appendix. Normal large bowel with no diverticulosis, large bowel wall thickening or pericolonic fat stranding.  Vascular/Lymphatic: Atherosclerotic nonaneurysmal abdominal aorta. Patent portal, splenic, hepatic and renal veins. No pathologically enlarged lymph nodes in the abdomen or pelvis.  Reproductive: Moderate-to-marked prostatomegaly with bilateral prostatic fluid collections measuring 3.3 x 3.0 cm on the right and 4.0 x 2.9 cm on the left, new since 08/01/2015 CT. Haziness of the periprostatic fat.  Other: No pneumoperitoneum, ascites or  focal fluid collection.  Musculoskeletal: No aggressive appearing focal osseous lesions. Moderate lumbar spondylosis.  IMPRESSION: 1. Relatively symmetric patchy parahilar consolidation and ground-glass opacity in both lungs, upper lobe predominant. Favor multilobar bronchopneumonia, although flash pulmonary edema could also have this appearance. 2. Acute uncomplicated pancreatitis. 3. 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 4. Hepatic cirrhosis.  Splenomegaly.  No ascites. 5. 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 6.  Aortic Atherosclerosis (ICD10-I70.0).  Electronically Signed   By: Ilona Sorrel M.D.   On: 11/26/2018 05:52   Assessment & Plan:    Simple Catheter Placement Due to urinary retention patient is present today for a foley cath placement.  Patient was cleaned and prepped in a sterile fashion with betadine and lidocaine jelly 2% was instilled into the urethra.  A 18 FR Coude foley catheter was inserted, urine return was noted  20 ml, urine was yellow in color.  The balloon was filled with 10cc of sterile water.  A leg bag was attached for drainage.  As there was no significant return of urine and catheter appears to be in the correct location, per Dr.  Diamantina Providence, we will obtain a RUS, abdominal ultrasound and bladder ultrasound to confirm placement.    Performed by: Zara Council, PA-C  1. Rectal bleeding -hx of rectal varices - advised to seek treatment in the ED by Dr. Murtha Norris  2. History of prostatic abscess S/P TURP of prostate abscesses 11/26/2018   3. BPH with LUTS - Post TURP due to abscess on 11/26/2018 ? Of retention versus ascites - will obtain an ultrasound of kidneys, abdomen and bladder for confirmation of Foley placement - if ascites is present and bladder is  decompressed - will discontinue the catheter - if catheter is not in correct position and bladder retention is present - will need to reposition catheter for drainage.      Return for patient transferred to the ED .  These notes generated with voice recognition software. I apologize for typographical errors.  Zara Council, PA-C  [facility] [address] [place], [place] [address]  [phone]  I, Adele Schilder, am acting as a Education administrator for [employer], PA-C.   I have reviewed the above documentation for accuracy and completeness, and I agree with the above.    Zara Council, PA-C

## 2018-12-15 ENCOUNTER — Emergency Department: Payer: Medicare Other

## 2018-12-15 ENCOUNTER — Encounter: Payer: Self-pay | Admitting: Urology

## 2018-12-15 ENCOUNTER — Other Ambulatory Visit: Payer: Self-pay

## 2018-12-15 ENCOUNTER — Encounter: Payer: Self-pay | Admitting: Emergency Medicine

## 2018-12-15 ENCOUNTER — Ambulatory Visit (INDEPENDENT_AMBULATORY_CARE_PROVIDER_SITE_OTHER): Payer: Medicare Other | Admitting: Urology

## 2018-12-15 ENCOUNTER — Inpatient Hospital Stay
Admission: EM | Admit: 2018-12-15 | Discharge: 2018-12-20 | DRG: 666 | Disposition: A | Discharge status: Home / Self Care | Payer: Medicare Other | Attending: Specialist | Admitting: Specialist

## 2018-12-15 VITALS — BP 102/66 | HR 137 | Temp 98.2°F | Ht 69.0 in | Wt 258.9 lb

## 2018-12-15 DIAGNOSIS — N1339 Other hydronephrosis: Secondary | ICD-10-CM | POA: Diagnosis not present

## 2018-12-15 DIAGNOSIS — N179 Acute kidney failure, unspecified: Secondary | ICD-10-CM | POA: Diagnosis not present

## 2018-12-15 DIAGNOSIS — G8929 Other chronic pain: Secondary | ICD-10-CM | POA: Diagnosis present

## 2018-12-15 DIAGNOSIS — K625 Hemorrhage of anus and rectum: Secondary | ICD-10-CM | POA: Diagnosis not present

## 2018-12-15 DIAGNOSIS — N401 Enlarged prostate with lower urinary tract symptoms: Secondary | ICD-10-CM

## 2018-12-15 DIAGNOSIS — E785 Hyperlipidemia, unspecified: Secondary | ICD-10-CM | POA: Diagnosis present

## 2018-12-15 DIAGNOSIS — E1122 Type 2 diabetes mellitus with diabetic chronic kidney disease: Secondary | ICD-10-CM | POA: Diagnosis present

## 2018-12-15 DIAGNOSIS — F17220 Nicotine dependence, chewing tobacco, uncomplicated: Secondary | ICD-10-CM | POA: Diagnosis present

## 2018-12-15 DIAGNOSIS — N320 Bladder-neck obstruction: Secondary | ICD-10-CM | POA: Diagnosis not present

## 2018-12-15 DIAGNOSIS — N390 Urinary tract infection, site not specified: Secondary | ICD-10-CM | POA: Diagnosis not present

## 2018-12-15 DIAGNOSIS — E8770 Fluid overload, unspecified: Secondary | ICD-10-CM | POA: Diagnosis present

## 2018-12-15 DIAGNOSIS — G4730 Sleep apnea, unspecified: Secondary | ICD-10-CM | POA: Diagnosis present

## 2018-12-15 DIAGNOSIS — N289 Disorder of kidney and ureter, unspecified: Secondary | ICD-10-CM

## 2018-12-15 DIAGNOSIS — Z960 Presence of urogenital implants: Secondary | ICD-10-CM

## 2018-12-15 DIAGNOSIS — M159 Polyosteoarthritis, unspecified: Secondary | ICD-10-CM | POA: Diagnosis present

## 2018-12-15 DIAGNOSIS — E860 Dehydration: Secondary | ICD-10-CM | POA: Diagnosis present

## 2018-12-15 DIAGNOSIS — E861 Hypovolemia: Secondary | ICD-10-CM | POA: Diagnosis present

## 2018-12-15 DIAGNOSIS — E119 Type 2 diabetes mellitus without complications: Secondary | ICD-10-CM

## 2018-12-15 DIAGNOSIS — Z8249 Family history of ischemic heart disease and other diseases of the circulatory system: Secondary | ICD-10-CM

## 2018-12-15 DIAGNOSIS — R1084 Generalized abdominal pain: Secondary | ICD-10-CM | POA: Diagnosis not present

## 2018-12-15 DIAGNOSIS — R188 Other ascites: Secondary | ICD-10-CM

## 2018-12-15 DIAGNOSIS — K760 Fatty (change of) liver, not elsewhere classified: Secondary | ICD-10-CM | POA: Diagnosis present

## 2018-12-15 DIAGNOSIS — Z8043 Family history of malignant neoplasm of testis: Secondary | ICD-10-CM

## 2018-12-15 DIAGNOSIS — I959 Hypotension, unspecified: Secondary | ICD-10-CM | POA: Diagnosis not present

## 2018-12-15 DIAGNOSIS — M549 Dorsalgia, unspecified: Secondary | ICD-10-CM | POA: Diagnosis present

## 2018-12-15 DIAGNOSIS — K7460 Unspecified cirrhosis of liver: Secondary | ICD-10-CM | POA: Diagnosis present

## 2018-12-15 DIAGNOSIS — N138 Other obstructive and reflux uropathy: Secondary | ICD-10-CM | POA: Diagnosis present

## 2018-12-15 DIAGNOSIS — N1330 Unspecified hydronephrosis: Secondary | ICD-10-CM | POA: Diagnosis not present

## 2018-12-15 DIAGNOSIS — R319 Hematuria, unspecified: Secondary | ICD-10-CM | POA: Diagnosis not present

## 2018-12-15 DIAGNOSIS — N136 Pyonephrosis: Secondary | ICD-10-CM | POA: Diagnosis present

## 2018-12-15 DIAGNOSIS — N412 Abscess of prostate: Secondary | ICD-10-CM | POA: Diagnosis present

## 2018-12-15 DIAGNOSIS — D631 Anemia in chronic kidney disease: Secondary | ICD-10-CM | POA: Diagnosis not present

## 2018-12-15 DIAGNOSIS — N178 Other acute kidney failure: Secondary | ICD-10-CM | POA: Diagnosis not present

## 2018-12-15 DIAGNOSIS — R310 Gross hematuria: Secondary | ICD-10-CM | POA: Diagnosis not present

## 2018-12-15 DIAGNOSIS — K219 Gastro-esophageal reflux disease without esophagitis: Secondary | ICD-10-CM | POA: Diagnosis present

## 2018-12-15 DIAGNOSIS — R339 Retention of urine, unspecified: Secondary | ICD-10-CM

## 2018-12-15 DIAGNOSIS — Z888 Allergy status to other drugs, medicaments and biological substances status: Secondary | ICD-10-CM | POA: Diagnosis not present

## 2018-12-15 DIAGNOSIS — D62 Acute posthemorrhagic anemia: Secondary | ICD-10-CM | POA: Diagnosis not present

## 2018-12-15 DIAGNOSIS — Z978 Presence of other specified devices: Secondary | ICD-10-CM

## 2018-12-15 DIAGNOSIS — G7000 Myasthenia gravis without (acute) exacerbation: Secondary | ICD-10-CM | POA: Diagnosis present

## 2018-12-15 DIAGNOSIS — R338 Other retention of urine: Secondary | ICD-10-CM | POA: Diagnosis not present

## 2018-12-15 DIAGNOSIS — Z8349 Family history of other endocrine, nutritional and metabolic diseases: Secondary | ICD-10-CM

## 2018-12-15 DIAGNOSIS — K7469 Other cirrhosis of liver: Secondary | ICD-10-CM | POA: Diagnosis not present

## 2018-12-15 DIAGNOSIS — Z955 Presence of coronary angioplasty implant and graft: Secondary | ICD-10-CM | POA: Diagnosis not present

## 2018-12-15 DIAGNOSIS — Z881 Allergy status to other antibiotic agents status: Secondary | ICD-10-CM

## 2018-12-15 DIAGNOSIS — I2510 Atherosclerotic heart disease of native coronary artery without angina pectoris: Secondary | ICD-10-CM | POA: Diagnosis present

## 2018-12-15 DIAGNOSIS — I129 Hypertensive chronic kidney disease with stage 1 through stage 4 chronic kidney disease, or unspecified chronic kidney disease: Secondary | ICD-10-CM | POA: Diagnosis present

## 2018-12-15 DIAGNOSIS — Z85820 Personal history of malignant melanoma of skin: Secondary | ICD-10-CM

## 2018-12-15 DIAGNOSIS — Z820 Family history of epilepsy and other diseases of the nervous system: Secondary | ICD-10-CM

## 2018-12-15 DIAGNOSIS — R600 Localized edema: Secondary | ICD-10-CM | POA: Diagnosis not present

## 2018-12-15 DIAGNOSIS — N189 Chronic kidney disease, unspecified: Secondary | ICD-10-CM | POA: Diagnosis present

## 2018-12-15 DIAGNOSIS — Z9841 Cataract extraction status, right eye: Secondary | ICD-10-CM | POA: Diagnosis not present

## 2018-12-15 DIAGNOSIS — K7031 Alcoholic cirrhosis of liver with ascites: Secondary | ICD-10-CM

## 2018-12-15 DIAGNOSIS — R601 Generalized edema: Secondary | ICD-10-CM | POA: Diagnosis not present

## 2018-12-15 DIAGNOSIS — R609 Edema, unspecified: Secondary | ICD-10-CM | POA: Diagnosis not present

## 2018-12-15 DIAGNOSIS — Z66 Do not resuscitate: Secondary | ICD-10-CM | POA: Diagnosis present

## 2018-12-15 DIAGNOSIS — B356 Tinea cruris: Secondary | ICD-10-CM | POA: Diagnosis not present

## 2018-12-15 DIAGNOSIS — Z9842 Cataract extraction status, left eye: Secondary | ICD-10-CM | POA: Diagnosis not present

## 2018-12-15 DIAGNOSIS — Z7984 Long term (current) use of oral hypoglycemic drugs: Secondary | ICD-10-CM

## 2018-12-15 DIAGNOSIS — N139 Obstructive and reflux uropathy, unspecified: Secondary | ICD-10-CM | POA: Diagnosis not present

## 2018-12-15 DIAGNOSIS — R109 Unspecified abdominal pain: Secondary | ICD-10-CM

## 2018-12-15 DIAGNOSIS — I5031 Acute diastolic (congestive) heart failure: Secondary | ICD-10-CM | POA: Diagnosis not present

## 2018-12-15 DIAGNOSIS — D696 Thrombocytopenia, unspecified: Secondary | ICD-10-CM | POA: Diagnosis present

## 2018-12-15 DIAGNOSIS — D649 Anemia, unspecified: Secondary | ICD-10-CM | POA: Diagnosis not present

## 2018-12-15 LAB — URINALYSIS, COMPLETE (UACMP) WITH MICROSCOPIC
Specific Gravity, Urine: 1.024 (ref 1.005–1.030)
Squamous Epithelial / HPF: NONE SEEN (ref 0–5)
WBC, UA: >50 WBC/hpf — ABNORMAL HIGH (ref 0–5)

## 2018-12-15 LAB — COMPREHENSIVE METABOLIC PANEL
ALBUMIN: 3 g/dL — AB (ref 3.5–5.0)
ALT: 21 U/L (ref 0–44)
AST: 30 U/L (ref 15–41)
Alkaline Phosphatase: 102 U/L (ref 38–126)
Anion gap: 8 (ref 5–15)
BUN: 47 mg/dL — AB (ref 8–23)
CO2: 24 mmol/L (ref 22–32)
Calcium: 8.9 mg/dL (ref 8.9–10.3)
Chloride: 111 mmol/L (ref 98–111)
Creatinine, Ser: 2.04 mg/dL — ABNORMAL HIGH (ref 0.61–1.24)
GFR calc Af Amer: 36 mL/min — ABNORMAL LOW (ref >60)
GFR calc non Af Amer: 31 mL/min — ABNORMAL LOW (ref >60)
GLUCOSE: 116 mg/dL — AB (ref 70–99)
Potassium: 4.1 mmol/L (ref 3.5–5.1)
Sodium: 143 mmol/L (ref 135–145)
Total Bilirubin: 1.4 mg/dL — ABNORMAL HIGH (ref 0.3–1.2)
Total Protein: 6.2 g/dL — ABNORMAL LOW (ref 6.5–8.1)

## 2018-12-15 LAB — CBC
HCT: 28.9 % — ABNORMAL LOW (ref 39.0–52.0)
Hemoglobin: 8.9 g/dL — ABNORMAL LOW (ref 13.0–17.0)
MCH: 30.8 pg (ref 26.0–34.0)
MCHC: 30.8 g/dL (ref 30.0–36.0)
MCV: 100 fL (ref 80.0–100.0)
Platelets: 115 10*3/uL — ABNORMAL LOW (ref 150–400)
RBC: 2.89 MIL/uL — ABNORMAL LOW (ref 4.22–5.81)
RDW: 17.9 % — AB (ref 11.5–15.5)
WBC: 4.9 10*3/uL (ref 4.0–10.5)
nRBC: 0 % (ref 0.0–0.2)

## 2018-12-15 LAB — C DIFFICILE QUICK SCREEN W PCR REFLEX
C DIFFICILE (CDIFF) INTERP: NOT DETECTED
C DIFFICILE (CDIFF) TOXIN: NEGATIVE
C Diff antigen: NEGATIVE

## 2018-12-15 LAB — BLADDER SCAN AMB NON-IMAGING

## 2018-12-15 LAB — GLUCOSE, CAPILLARY: Glucose-Capillary: 128 mg/dL — ABNORMAL HIGH (ref 70–99)

## 2018-12-15 MED ORDER — SIMVASTATIN 20 MG PO TABS
40.0000 mg | ORAL_TABLET | Freq: Every day | ORAL | Status: DC
  Administered 2018-12-16 – 2018-12-19 (×3): 40 mg via ORAL
  Filled 2018-12-15 (×3): qty 2

## 2018-12-15 MED ORDER — INSULIN ASPART 100 UNIT/ML ~~LOC~~ SOLN
0.0000 [IU] | Freq: Every day | SUBCUTANEOUS | Status: DC
  Administered 2018-12-19: 2 [IU] via SUBCUTANEOUS
  Filled 2018-12-15: qty 1

## 2018-12-15 MED ORDER — FENTANYL CITRATE (PF) 100 MCG/2ML IJ SOLN
100.0000 ug | Freq: Once | INTRAMUSCULAR | Status: AC
  Administered 2018-12-15: 100 ug via INTRAVENOUS
  Filled 2018-12-15: qty 2

## 2018-12-15 MED ORDER — SODIUM CHLORIDE 0.9 % IV SOLN
INTRAVENOUS | Status: DC
  Administered 2018-12-15: 21:00:00 via INTRAVENOUS

## 2018-12-15 MED ORDER — PANTOPRAZOLE SODIUM 40 MG PO TBEC
40.0000 mg | DELAYED_RELEASE_TABLET | Freq: Every day | ORAL | Status: DC
  Administered 2018-12-16 – 2018-12-20 (×5): 40 mg via ORAL
  Filled 2018-12-15 (×5): qty 1

## 2018-12-15 MED ORDER — IOHEXOL 300 MG/ML  SOLN
50.0000 mL | Freq: Once | INTRAMUSCULAR | Status: AC | PRN
  Administered 2018-12-15: 50 mL via URETHRAL
  Filled 2018-12-15: qty 50

## 2018-12-15 MED ORDER — LIDOCAINE HCL URETHRAL/MUCOSAL 2 % EX GEL
1.0000 "application " | Freq: Once | CUTANEOUS | Status: AC
  Administered 2018-12-15: 1 via URETHRAL

## 2018-12-15 MED ORDER — DOCUSATE SODIUM 100 MG PO CAPS: 100.0000 mg | ORAL_CAPSULE | Freq: Two times a day (BID) | ORAL | Status: DC | PRN

## 2018-12-15 MED ORDER — PYRIDOSTIGMINE BROMIDE 60 MG PO TABS
60.0000 mg | ORAL_TABLET | Freq: Four times a day (QID) | ORAL | Status: DC
  Administered 2018-12-15 – 2018-12-20 (×16): 60 mg via ORAL
  Filled 2018-12-15 (×22): qty 1

## 2018-12-15 MED ORDER — SODIUM CHLORIDE 0.9 % IV SOLN
1.0000 g | Freq: Once | INTRAVENOUS | Status: AC
  Administered 2018-12-15: 1 g via INTRAVENOUS
  Filled 2018-12-15: qty 10

## 2018-12-15 MED ORDER — CEFDINIR 300 MG PO CAPS
300.0000 mg | ORAL_CAPSULE | Freq: Two times a day (BID) | ORAL | Status: DC
  Administered 2018-12-15 – 2018-12-20 (×10): 300 mg via ORAL
  Filled 2018-12-15 (×16): qty 1

## 2018-12-15 MED ORDER — FUROSEMIDE 10 MG/ML IJ SOLN
80.0000 mg | Freq: Once | INTRAMUSCULAR | Status: AC
  Administered 2018-12-15: 80 mg via INTRAVENOUS
  Filled 2018-12-15: qty 8

## 2018-12-15 MED ORDER — INSULIN ASPART 100 UNIT/ML ~~LOC~~ SOLN
0.0000 [IU] | Freq: Three times a day (TID) | SUBCUTANEOUS | Status: DC
  Administered 2018-12-16 – 2018-12-18 (×6): 2 [IU] via SUBCUTANEOUS
  Administered 2018-12-19: 3 [IU] via SUBCUTANEOUS
  Administered 2018-12-19 – 2018-12-20 (×2): 1 [IU] via SUBCUTANEOUS
  Filled 2018-12-15 (×9): qty 1

## 2018-12-15 MED ORDER — TRAMADOL HCL 50 MG PO TABS
50.0000 mg | ORAL_TABLET | Freq: Two times a day (BID) | ORAL | Status: DC | PRN
  Administered 2018-12-16 – 2018-12-19 (×5): 50 mg via ORAL
  Filled 2018-12-15 (×5): qty 1

## 2018-12-15 MED ORDER — IPRATROPIUM-ALBUTEROL 0.5-2.5 (3) MG/3ML IN SOLN: 3.0000 mL | RESPIRATORY_TRACT | Status: DC | PRN

## 2018-12-15 MED ORDER — ADULT MULTIVITAMIN W/MINERALS CH
1.0000 | ORAL_TABLET | Freq: Every day | ORAL | Status: DC
  Administered 2018-12-16 – 2018-12-20 (×5): 1 via ORAL
  Filled 2018-12-15 (×5): qty 1

## 2018-12-15 MED ORDER — SODIUM CHLORIDE 0.9 % IV BOLUS
1000.0000 mL | Freq: Once | INTRAVENOUS | Status: AC
  Administered 2018-12-15: 1000 mL via INTRAVENOUS

## 2018-12-15 MED ORDER — TAMSULOSIN HCL 0.4 MG PO CAPS
0.4000 mg | ORAL_CAPSULE | Freq: Every day | ORAL | Status: DC
  Administered 2018-12-15 – 2018-12-20 (×6): 0.4 mg via ORAL
  Filled 2018-12-15 (×6): qty 1

## 2018-12-15 NOTE — Consult Note (Signed)
[facility] GI Inpatient Consult Note   Kathline Magic, M.D.  Reason for Consult: Cirrhosis, ascites, rectal bleeding   Attending Requesting Consult: Dr. Anselm Jungling  History of Present Illness: Mason Wilson. is a 78 y.o. male with a history of nonalcoholic fatty liver disease-related cirrhosis status post TURP procedure approximately 3 weeks ago.  Patient appeared to have some issues with prostatitis with abscesses prior to the TURP.  Patient appears to have had some increased swelling as well as oliguria secondary so some possible postobstructive etiology.  Patient has creatinine level of 2.0 as well as significant anasarca.  He has myasthenia gravis and has difficulty sometimes with dysphagia.  The anasarca has resulted and significant abdominal distention which prevents the patient from sitting forward and eating safely.  The patient has had recent onset of rectal bleeding repeatedly over the last week.  He is followed by Dr. Lucilla Lame who performed EGD and colonoscopy on 07/03/2018 revealing grade 1 esophageal varices moderate portal hypertensive gastropathy.  Adenomatous polyps were noted on colonoscopy that were removed.  Also noted were internal hemorrhoids as well as rectal varices. Review of the ED notes seems to indicate a normal rectal examination with Hemoccult negative stool.  This is in the setting patient reporting hematochezia a couple of hours prior to presentation to the emergency room.  Past Medical History:  Past Medical History:  Diagnosis Date  . Allergy   . Arthritis    "everywhere' - worse in back  . Body mass index 39.0-39.9, adult   . CAD (coronary artery disease)   . Chronic back pain   . Cirrhosis, non-alcoholic ([place])   . Diabetes mellitus without complication ([place])   . Elevated liver enzymes   . GERD (gastroesophageal reflux disease)   . Heart murmur   . Hyperlipidemia   . Hypertension   . Hypogonadism in male   . Melanoma ([place])    melanoma    . Melanoma in situ of right shoulder ([place])   . Myasthenia gravis ([place]) 01/2018  . Renal stones   . Sleep apnea    CPAP  . Uses hearing aid    doesn't wear    Problem List: Patient Active Problem List   Diagnosis Date Noted  . Ascites 12/15/2018  . Acute lower UTI 12/15/2018  . Prostate abscess   . Septic shock ([place]) 11/26/2018  . Senile purpura ([place]) 09/05/2018  . Portal hypertension ([place])   . Gastritis without bleeding   . Secondary esophageal varices without bleeding ([place])   . Benign neoplasm of ascending colon   . Benign neoplasm of descending colon   . Iron deficiency anemia due to chronic blood loss 05/30/2018  . Splenomegaly 04/28/2018  . Myasthenia gravis without acute exacerbation ([place]) 03/02/2018  . Advanced care planning/counseling discussion 09/01/2017  . LFT elevation   . Acute renal failure (ARF) ([place]) 06/23/2016  . Essential hypertension 10/13/2015  . Back muscle spasm 09/15/2015  . 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 Hepatic cirrhosis ([place])   . Esophageal varices in cirrhosis ([facility])   . Cirrhosis ([place]) 08/11/2015  . Abnormal tumor markers 07/28/2015  . Other pancytopenia ([place]) 07/28/2015  . Weight loss 07/28/2015  . 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 Diabetes mellitus without complication ([place]) 02/58/5277  . CAD (coronary artery disease) 07/08/2015  . Chronic kidney disease 07/08/2015  . Hyperlipidemia 07/08/2015  . BMI 34.0-34.9,adult 07/08/2015  . BPH (benign prostatic hyperplasia) 07/08/2015  . Low back pain  07/08/2015    Past Surgical History: Past Surgical History:  Procedure Laterality Date  . ANGIOPLASTY  1999   3 stents  . CARDIAC CATHETERIZATION     stents placed  . CARPAL TUNNEL RELEASE     x2  . CATARACT EXTRACTION, BILATERAL    . COLONOSCOPY WITH PROPOFOL N/A 07/03/2018   Procedure: COLONOSCOPY WITH PROPOFOL;  Surgeon: Lucilla Lame, MD;  Location: [place];  Service: Endoscopy;   Laterality: N/A;  . ESOPHAGOGASTRODUODENOSCOPY (EGD) WITH PROPOFOL N/A 09/08/2015   Procedure: ESOPHAGOGASTRODUODENOSCOPY (EGD) WITH PROPOFOL;  Surgeon: Lucilla Lame, MD;  Location: [facility];  Service: Endoscopy;  Laterality: N/A;  CPAP Diabetic - oral meds  . ESOPHAGOGASTRODUODENOSCOPY (EGD) WITH PROPOFOL N/A 07/03/2018   Procedure: ESOPHAGOGASTRODUODENOSCOPY (EGD) WITH PROPOFOL;  Surgeon: Lucilla Lame, MD;  Location: [place];  Service: Endoscopy;  Laterality: N/A;  diabetic - oral meds sleep apnea  . LIVER BIOPSY    . POLYPECTOMY N/A 07/03/2018   Procedure: POLYPECTOMY;  Surgeon: Lucilla Lame, MD;  Location: [place];  Service: Endoscopy;  Laterality: N/A;  . SKIN LESION EXCISION  1998  . SKIN LESION EXCISION     skin cancer removed on forehead in June 2018  . TONSILLECTOMY     age 30  . TRANSURETHRAL RESECTION OF PROSTATE N/A 11/26/2018   Procedure: TRANSURETHRAL RESECTION OF THE PROSTATE (TURP);  Surgeon: Irine Seal, MD;  Location: [facility];  Service: Urology;  Laterality: N/A;    Allergies: Allergies  Allergen Reactions  . Altace [Ramipril] Cough    Home Medications: (Not in a hospital admission)  Home medication reconciliation was completed with the patient.   Scheduled Inpatient Medications:   . insulin aspart  0-5 Units Subcutaneous QHS  . [START ON 12/16/2018] insulin aspart  0-9 Units Subcutaneous TID WC    Continuous Inpatient Infusions:   . sodium chloride      PRN Inpatient Medications:    Family History: family history includes Alzheimer's disease in his mother; Cancer (age of onset: 67) in his father; Heart attack in his father; Heart disease in his father and mother; Hypertension in his father; Thyroid disease in his mother.   GI Family History: Negative  Social History:   reports that he quit smoking about 48 years ago. His smoking use included cigarettes. His smokeless tobacco use includes chew. He reports that he does not  drink alcohol or use drugs. The patient denies ETOH, tobacco, or drug use.    Review of Systems: Review of Systems - General ROS: positive for  - weight gain negative for - fever Psychological ROS: negative ENT ROS: negative Allergy and Immunology ROS: negative Endocrine ROS: positive for - malaise/lethargy negative for - galactorrhea or mood swings Respiratory ROS: positive for - orthopnea negative for - wheezing Cardiovascular ROS: no chest pain or dyspnea on exertion Genito-Urinary ROS: positive for - change in urinary stream Musculoskeletal ROS: negative Neurological ROS: no TIA or stroke symptoms Dermatological ROS: negative  Physical Examination: BP 104/61 (BP Location: Left Arm)   Pulse 88   Temp 98.5 F (36.9 C) (Oral)   Resp 16   Ht 5\' 9"  (1.753 m)   Wt 117 kg   SpO2 97%   BMI 38.10 kg/m  Physical Exam Constitutional:      General: He is not in acute distress.    Appearance: He is obese. He is ill-appearing. He is not toxic-appearing.  HENT:     Head: Normocephalic and atraumatic.  Nose: Nose normal.  Eyes:     Conjunctiva/sclera: Conjunctivae normal.     Pupils: Pupils are equal, round, and reactive to light.  Cardiovascular:     Rate and Rhythm: Normal rate.     Pulses: Normal pulses.  Pulmonary:     Effort: No respiratory distress.     Breath sounds: Rales present. No wheezing or rhonchi.  Abdominal:     General: There is distension.     Palpations: There is shifting dullness and fluid wave. There is no mass.     Tenderness: There is no rebound.     Hernia: No hernia is present.  Musculoskeletal:     Right shoulder: He exhibits decreased range of motion. He exhibits no tenderness, no bony tenderness, no deformity, no laceration and no pain.  Lymphadenopathy:     Cervical: No cervical adenopathy.  Skin:    Coloration: Skin is ashen and pale. Skin is not jaundiced.     Nails: There is no clubbing.   Neurological:     Mental Status: He is alert  and oriented to person, place, and time.     Sensory: No sensory deficit.  Psychiatric:        Attention and Perception: Attention normal.        Mood and Affect: Mood normal.        Speech: Speech normal.        Behavior: Behavior normal.     Data: Lab Results  Component Value Date   WBC 4.9 12/15/2018   HGB 8.9 (L) 12/15/2018   HCT 28.9 (L) 12/15/2018   MCV 100.0 12/15/2018   PLT 115 (L) 12/15/2018   Recent Labs  Lab 12/15/18 1202  HGB 8.9*   Lab Results  Component Value Date   NA 143 12/15/2018   K 4.1 12/15/2018   CL 111 12/15/2018   CO2 24 12/15/2018   BUN 47 (H) 12/15/2018   CREATININE 2.04 (H) 12/15/2018   Lab Results  Component Value Date   ALT 21 12/15/2018   AST 30 12/15/2018   ALKPHOS 102 12/15/2018   BILITOT 1.4 (H) 12/15/2018   No results for input(s): APTT, INR, PTT in the last 168 hours. CBC Latest Ref Rng & Units 12/15/2018 11/30/2018 11/29/2018  WBC 4.0 - 10.5 K/uL 4.9 6.3 4.9  Hemoglobin 13.0 - 17.0 g/dL 8.9(L) 8.3(L) 8.0(L)  Hematocrit 39.0 - 52.0 % 28.9(L) 24.5(L) 24.8(L)  Platelets 150 - 400 K/uL 115(L) 44(L) 36(L)    STUDIES: Ct Abdomen Pelvis Wo Contrast  Result Date: 12/15/2018 CLINICAL DATA:  History of recent TURP. EXAM: CT ABDOMEN AND PELVIS WITHOUT CONTRAST TECHNIQUE: Multidetector CT imaging of the abdomen and pelvis was performed following the standard protocol without IV contrast. COMPARISON:  CT scan 11/26/2018 FINDINGS: Lower chest: The heart is upper limits of normal in size and stable. Stable three-vessel coronary artery calcifications. There is a small left pleural effusion with overlying atelectasis. Hepatobiliary: Stable advanced cirrhotic changes involving the liver but no obvious hepatic mass. The gallbladder is grossly normal. No common bile duct dilatation. Pancreas: No mass, inflammation or ductal dilatation. Moderate atrophy. Spleen: Stable splenomegaly. Adrenals/Urinary Tract: The adrenal glands and kidneys are unremarkable  and stable. The bladder contains a Foley catheter. 200 cc of contrast was put in the bladder. No asymmetric bladder wall thickening or bladder mass is identified. The bladder wall is intact. There is extravasation of contrast into the prostate gland and 2 areas 1 in the left mid gland and 1 in  the lower right gland. These are areas of previous cystic appearing cavities are abscesses on prior CT scan. Stomach/Bowel: The stomach, duodenum, small bowel and colon are grossly normal. No acute inflammatory changes, mass lesions or obstructive findings. Vascular/Lymphatic: Stable aortic calcifications. Stable scattered mesenteric and retroperitoneal lymph nodes. Reproductive: Expected surgical changes involving the prostate gland from the recent TURP. Extravasation of contrast into the prostate gland as stated above. Other: Large volume abdominal/pelvic ascites and diffuse body wall edema. Musculoskeletal: No significant bony findings. IMPRESSION: 1. The bladder is intact.  No bladder rupture or leakage. 2. Two areas of extravasation of contrast into the prostate gland. I assume this is normal after a TURP. 3. Advanced cirrhotic changes involving the liver with associated splenomegaly and large volume abdominal/pelvic ascites. Electronically Signed   By: Marijo Sanes M.D.   On: 12/15/2018 16:20   US Renal  Result Date: 12/15/2018 CLINICAL DATA:  78 y/o  M; hydronephrosis. EXAM: RENAL / URINARY TRACT ULTRASOUND COMPLETE COMPARISON:  11/26/2018 CT abdomen and pelvis. FINDINGS: Right Kidney: Renal measurements: 13.0 x 6.7 x 5.7 cm = volume: 259 mL . Echogenicity within normal limits. No mass or hydronephrosis visualized. Cortical atrophy. Lower pole simple cyst measuring up to 2.8 cm. Left Kidney: Renal measurements: 12.1 x 6.9 x 5.2 cm = volume: 225 mL. Echogenicity within normal limits. No mass or hydronephrosis visualized. Cortical atrophy. Interpolar echogenic shadowing stone measuring up to 9 mm. Bladder: Not  visualized.  Ascites. IMPRESSION: 1. Hydronephrosis. Mild cortical atrophy of bilateral kidneys. Left kidney interpolar 9 mm stone. 2. Ascites. Electronically Signed   By: Kristine Garbe M.D.   On: 12/15/2018 13:59   @IMAGES @  Assessment:  1. Anasarca - Suggests some decompensation in liver disease possibly related to recent surgical stressors.  2. Acute renal insufficiency - Possibly as well related to worsening liver disease vs. Urinary tract obstruction  Relieved with foley insertion. No evidence of bladder rupture on xray. 3. 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  4. Massive Ascites - secondary to worsening liver disease.  Recommendations: 1. Despite ARI, I will give one IV dose of furosemide to mobilize fluid and relieve ascites.  2. Low sodium diet. 3. Paracentesis as ordered. On IV cefotaxime for Prostatic infection but also will help with SBP if present. 4. Await nephrology opinion regarding ARF; will hold off on further diuretics until after seen. 5. Monitor for recurrent rectal bleeding. Consider sigmoidoscopy for recurrences.  6. Dr. Bonna Gains covering the GI service from Saturday until Monday at 7 am. Please call me in the interim with any questions.   Thank you for the consult. Please call with questions or concerns.  Olean Ree, "Lanny Hurst MD [facility] [place], [place] [address] [phone]  12/15/2018 7:54 PM

## 2018-12-15 NOTE — ED Notes (Signed)
Patient in US.

## 2018-12-15 NOTE — Progress Notes (Signed)
Family Meeting Note  Advance Directive:yes  Today a meeting took place with the Patient and spouse.   The following clinical team members were present during this meeting:MD  The following were discussed:Patient's diagnosis: Ascites, acute renal failure, UTI, anasarca, hypertension, diabetes, Patient's progosis: Unable to determine and Goals for treatment: DNR  Additional follow-up to be provided: Nephrology, GI  Time spent during discussion:20 minutes  Vaughan Basta, MD

## 2018-12-15 NOTE — ED Notes (Signed)
Patient to Rm 33 from [place].

## 2018-12-15 NOTE — ED Notes (Signed)
Patient being transported to CT at this time 

## 2018-12-15 NOTE — Progress Notes (Signed)
Talked to Dr. Jannifer Franklin about patient's request for pain medication, order for Tramadol PRN given. Also patient is wheezing, order for Duoneb PRN. Asked if patient needs to be NPO for patient's scheduled paracentesis tomorrow, will place NPO at midnight. RN will continue to monitor.

## 2018-12-15 NOTE — ED Notes (Signed)
Discussed care with EDP Kinner.    After bladder attempt of results of 999, and only less than clear urine in leg bag.  The decision to remove foley was made.    Foley removed without difficulty by this RN

## 2018-12-15 NOTE — ED Provider Notes (Signed)
[facility] Emergency Department Provider Note  Time seen: 3:14 PM  I have reviewed the triage vital signs and the nursing notes.   HISTORY  Chief Complaint Urinary Retention    HPI Mason Wilson. is a 78 y.o. male with a past medical history of CAD, nonalcoholic cirrhosis, gastric reflux, hypertension, hyperlipidemia, myasthenia gravis, presents to the emergency department being referred by urology.  According to the patient he went home from the hospital approximately 2 weeks ago after an admission for septic shock requiring ICU and intubation likely due to a recent prostate surgery/infection.  Since going home from the hospital patient states he is continued to be somewhat weak, while urology he was complaining of very minimal urine output over the past week or so which she estimates is approximately 100 cc 2-3 times per day.  He also states at times he has noticed rectal bleeding states this has been ongoing for greater than 1 month before his hospitalization.  Urology placed a Foley catheter after obtaining a bladder scan showing greater than 400 cc of urine however no urine came out of the catheter so the patient was sent to the emergency department for further evaluation.  Patient states mild abdominal discomfort but states this is fairly chronic for him denies any acute exacerbation of pain.  Patient denies any dysuria, states his urine is dark and he only urinates a very small amount each day.  Patient denies any fever cough or congestion.   Past Medical History:  Diagnosis Date  . Allergy   . Arthritis    "everywhere' - worse in back  . Body mass index 39.0-39.9, adult   . CAD (coronary artery disease)   . Chronic back pain   . Cirrhosis, non-alcoholic ([place])   . Diabetes mellitus without complication ([place])   . Elevated liver enzymes   . GERD (gastroesophageal reflux disease)   . Heart murmur   . Hyperlipidemia   . Hypertension   . Hypogonadism in  male   . Melanoma ([place])    melanoma   . Melanoma in situ of right shoulder ([place])   . Myasthenia gravis ([place]) 01/2018  . Renal stones   . Sleep apnea    CPAP  . Uses hearing aid    doesn't wear    Patient Active Problem List   Diagnosis Date Noted  . Prostate abscess   . Septic shock ([place]) 11/26/2018  . Senile purpura ([place]) 09/05/2018  . Portal hypertension ([place])   . Gastritis without bleeding   . Secondary esophageal varices without bleeding ([place])   . Benign neoplasm of ascending colon   . Benign neoplasm of descending colon   . Iron deficiency anemia due to chronic blood loss 05/30/2018  . Splenomegaly 04/28/2018  . Myasthenia gravis without acute exacerbation ([place]) 03/02/2018  . Advanced care planning/counseling discussion 09/01/2017  . LFT elevation   . Essential hypertension 10/13/2015  . Back muscle spasm 09/15/2015  . 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 Hepatic cirrhosis ([place])   . Esophageal varices in cirrhosis ([facility])   . Cirrhosis ([place]) 08/11/2015  . Abnormal tumor markers 07/28/2015  . Other pancytopenia ([place]) 07/28/2015  . Weight loss 07/28/2015  . 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 Diabetes mellitus without complication ([place]) 67/20/9470  . CAD (coronary artery disease) 07/08/2015  . Chronic kidney disease 07/08/2015  . Hyperlipidemia 07/08/2015  . BMI 34.0-34.9,adult 07/08/2015  . BPH (benign prostatic hyperplasia) 07/08/2015  . Low  back pain 07/08/2015    Past Surgical History:  Procedure Laterality Date  . ANGIOPLASTY  1999   3 stents  . CARDIAC CATHETERIZATION     stents placed  . CARPAL TUNNEL RELEASE     x2  . CATARACT EXTRACTION, BILATERAL    . COLONOSCOPY WITH PROPOFOL N/A 07/03/2018   Procedure: COLONOSCOPY WITH PROPOFOL;  Surgeon: Lucilla Lame, MD;  Location: [place];  Service: Endoscopy;  Laterality: N/A;  . ESOPHAGOGASTRODUODENOSCOPY (EGD) WITH PROPOFOL N/A 09/08/2015   Procedure:  ESOPHAGOGASTRODUODENOSCOPY (EGD) WITH PROPOFOL;  Surgeon: Lucilla Lame, MD;  Location: [place];  Service: Endoscopy;  Laterality: N/A;  CPAP Diabetic - oral meds  . ESOPHAGOGASTRODUODENOSCOPY (EGD) WITH PROPOFOL N/A 07/03/2018   Procedure: ESOPHAGOGASTRODUODENOSCOPY (EGD) WITH PROPOFOL;  Surgeon: Lucilla Lame, MD;  Location: [place];  Service: Endoscopy;  Laterality: N/A;  diabetic - oral meds sleep apnea  . LIVER BIOPSY    . POLYPECTOMY N/A 07/03/2018   Procedure: POLYPECTOMY;  Surgeon: Lucilla Lame, MD;  Location: [place];  Service: Endoscopy;  Laterality: N/A;  . SKIN LESION EXCISION  1998  . SKIN LESION EXCISION     skin cancer removed on forehead in June 2018  . TONSILLECTOMY     age 5  . TRANSURETHRAL RESECTION OF PROSTATE N/A 11/26/2018   Procedure: TRANSURETHRAL RESECTION OF THE PROSTATE (TURP);  Surgeon: Irine Seal, MD;  Location: [facility];  Service: Urology;  Laterality: N/A;    Prior to Admission medications   Medication Sig Start Date End Date Taking? Authorizing Provider  acetaminophen (TYLENOL) 325 MG tablet Place 2 tablets (650 mg total) into feeding tube every 6 (six) hours as needed for mild pain (or Fever >/= 101). 11/30/18   Vaughan Basta, MD  Calcium Carbonate-Vit D-Min (CALCIUM 1200 PO) Take 1,200 mg by mouth.    [provider]  cefUROXime (CEFTIN) 250 MG tablet Take 1 tablet (250 mg total) by mouth 2 (two) times daily. 11/30/18 01/14/19  Vaughan Basta, MD  glipiZIDE (GLUCOTROL) 5 MG tablet Take 1 tablet (5 mg total) by mouth daily before breakfast. 09/05/18   [name], MD  losartan (COZAAR) 100 MG tablet Take 1 tablet (100 mg total) by mouth daily. 09/05/18   [name], MD  metFORMIN (GLUCOPHAGE) 500 MG tablet Take 2 tablets (1,000 mg total) by mouth 2 (two) times daily with a meal. 09/05/18   Crissman, Jeannette How, MD  metoprolol succinate (TOPROL-XL) 50 MG 24 hr tablet Take 1 tablet (50 mg total) by  mouth daily. Take with or immediately following a meal. 09/05/18   Crissman, Jeannette How, MD  Multiple Vitamin (MULTIVITAMIN WITH MINERALS) TABS tablet Take 1 tablet by mouth daily. 12/01/18   Vaughan Basta, MD  omeprazole (PRILOSEC) 40 MG capsule Take 40 mg by mouth daily.  11/23/18   [provider]  pyridostigmine (MESTINON) 60 MG tablet Take 60 mg by mouth 4 (four) times daily.    [provider]  simvastatin (ZOCOR) 40 MG tablet Take 1 tablet (40 mg total) by mouth daily at 6 PM. 09/05/18   [name], MD  tamsulosin (FLOMAX) 0.4 MG CAPS capsule TAKE 1 CAPSULE(0.4 MG) BY MOUTH DAILY 08/09/18   Ernestine Conrad, [place]rene [place] A, PA-C    Allergies  Allergen Reactions  . Altace [Ramipril] Cough    Family History  Problem Relation Age of Onset  . Thyroid disease Mother   . Alzheimer's disease Mother   . Heart disease Mother   .  Cancer Father 70       testicular  . Heart disease Father   . Hypertension Father   . Heart attack Father   . Kidney disease Neg Hx   . Prostate cancer Neg Hx   . Kidney cancer Neg Hx   . Bladder Cancer Neg Hx     Social History Social History   Tobacco Use  . Smoking status: Former Smoker    Types: Cigarettes    Last attempt to quit: 04/02/1970    Years since quitting: 48.7  . Smokeless tobacco: Current User    Types: Chew  . Tobacco comment: approx quit in 1971   Substance Use Topics  . Alcohol use: No    Alcohol/week: 0.0 standard drinks    Comment: stopped in 1996  . Drug use: No    Review of Systems Constitutional: Negative for fever. ENT: Negative for recent illness/congestion Cardiovascular: Negative for chest pain. Respiratory: Negative for shortness of breath. Gastrointestinal: Abdominal discomfort, generalized, unchanged from chronic per patient.  No active vomiting.  Patient has noted intermittent blood in his stool over the past 1 month. Genitourinary: Decreased urine output. Musculoskeletal: Negative for  musculoskeletal complaints Skin: Negative for skin complaints  Neurological: Negative for headache All other ROS negative  ____________________________________________   PHYSICAL EXAM:  VITAL SIGNS: ED Triage Vitals  Enc Vitals Group     BP 12/15/18 1156 (!) 103/57     Pulse Rate 12/15/18 1156 (!) 114     Resp 12/15/18 1156 20     Temp 12/15/18 1156 98.7 F (37.1 C)     Temp Source 12/15/18 1156 Oral     SpO2 12/15/18 1156 98 %     Weight 12/15/18 1157 258 lb (117 kg)     Height 12/15/18 1157 5\' 9"  (1.753 m)     Head Circumference --      Peak Flow --      Pain Score 12/15/18 1156 0     Pain Loc --      Pain Edu? --      Excl. in [place]? --     Constitutional: Alert and oriented. Well appearing and in no distress. Eyes: Normal exam ENT   Head: Normocephalic and atraumatic.   Mouth/Throat: Mucous membranes are moist. Cardiovascular: Normal rate, regular rhythm.  Respiratory: Normal respiratory effort without tachypnea nor retractions. Breath sounds are clear Gastrointestinal: Mild distention, no tenderness to palpation currently.  Dull percussion consistent with ascites. Musculoskeletal: Nontender with normal range of motion in all extremities.  Neurologic:  Normal speech and language. No gross focal neurologic deficits Skin:  Skin is warm, dry and intact.  Psychiatric: Mood and affect are normal.   ____________________________________________   RADIOLOGY   IMPRESSION: 1. Hydronephrosis. Mild cortical atrophy of bilateral kidneys. Left kidney interpolar 9 mm stone. 2. Ascites.  IMPRESSION: 1. The bladder is intact. No bladder rupture or leakage. 2. Two areas of extravasation of contrast into the prostate gland. I assume this is normal after a TURP. 3. Advanced cirrhotic changes involving the liver with associated splenomegaly and large volume abdominal/pelvic ascites.  ____________________________________________   INITIAL IMPRESSION / ASSESSMENT AND  PLAN / ED COURSE  Pertinent labs & imaging results that were available during my care of the patient were reviewed by me and considered in my medical decision making (see chart for details).  Department from urology for evaluation of Foley catheter placement, possible urinary obstruction as well as intermittent rectal bleeding x1 month.  As far as  the rectal bleeding patient has no abdominal tenderness to exam, patient's labs are largely at baseline including baseline anemia, no significant blood loss.  Rectal examination is nontender, no obvious active hemorrhoids, stool is light brown/yellow in color and is guaiac negative.  We will have the patient follow-up with GI medicine for further evaluation regarding his reported intermittent rectal bleeding.  As far as the urologic complaints, urology reports greater than 400 cc of urine in the bladder however no urine output from Foley catheter.  Catheter was pulled in triage once the patient arrived to the emergency department due to discomfort.  Ultrasound was performed in the emergency department and they are unable to visualize the bladder, possible nondistended bladder but the patient also has ascites in his abdomen, which would raise concern for possible bladder rupture.  I discussed the patient with radiology, we will proceed with a CT cystogram to further evaluate the bladder.  Patient's creatinine is currently 2.04 with a baseline creatinine around 0.8 0.9 which would be consistent with renal insufficiency and dehydration.  Patient's anemia is largely unchanged from baseline in fact it is improving.  Patient is noted to be hypotensive in the emergency department 86/67, could very likely be due to dehydration, hypovolemia or renal insufficiency.  Reassuringly patient's white blood cell count is normal, afebrile.  A new Foley catheter was placed in the emergency department by the nurse and myself, Foley catheter.  To have good urine output and balloon was  inflated, urine is dark in appearance could be bloody from trauma of Foley catheters, we will send a urinalysis and urine culture as a precaution.  We will continue IV hydration.  Will obtain a CT cystogram to further evaluate.  CT is negative for bladder rupture.  Patient's urinalysis has resulted showing greater than 50 white and red cells with many bacteria likely infected urine, we will send urine culture.  Due to hypotension patient continues to receive IV fluids, we will dose IV antibiotics admit to the hospitalist service.  ____________________________________________   FINAL CLINICAL IMPRESSION(S) / ED DIAGNOSES  Decreased urine output Acute renal insufficiency Dehydration Hypotension Urinary tract infection   Harvest Dark, MD 12/15/18 [phone]

## 2018-12-15 NOTE — ED Notes (Signed)
Patient visibly shaking, complaining of feeling cold.  Afebrile.  Warm blankets given to patient.  Wife at [facility].

## 2018-12-15 NOTE — H&P (Signed)
Mason Wilson NAME: Mason Wilson    MR#:  [id number]  DATE OF BIRTH:  1941/02/05  DATE OF ADMISSION:  12/15/2018  PRIMARY CARE PHYSICIAN: [name], MD   REQUESTING/REFERRING PHYSICIAN: [email or web address]  CHIEF COMPLAINT:   Chief Complaint  Patient presents with  . Urinary Retention    HISTORY OF PRESENT ILLNESS: Mason Wilson  is a 78 y.o. male with a known history of arthritis, coronary artery disease, chronic back pain, nonalcoholic liver cirrhosis, diabetes, gastroesophageal reflux disease, hypertension, melanoma, myasthenia gravis, CPAP at night use for sleep apnea-was admitted to hospital 2 to 3 weeks ago for prostate abscess after having TURP.  He was septic and had bacteremia at that time and was admitted to ICU.  After improvement-was discharged home with advised to continue antibiotic for 6 to 8 weeks.  Urology wanted to continue the catheter but it was removed on transfer from ICU to the floor, and patient was able to urinate fine, so suggested no placement of Foley again to avoid further trauma. He had worsening swelling on his abdomen and thighs and also had recurrent episodes of bleeding per rectum over the last few weeks. Today he had a follow-up appointment with urology clinic.  They thought patient is not urinating enough.  Also patient had significant swelling on his abdomen his legs and thighs.  Urology clinic tried doing a catheterization and bladder scan but urine did not come out and there was suspected of rupturing the bladder so sent him to emergency room. In ER a Foley catheter placed and CT scan was done with also injecting some dye and he did not show any bladder injuries.  CT scan reported significant ascites in his abdomen.  He was also noted to have acute renal failure. Denies any fever, chills.  PAST MEDICAL HISTORY:   Past Medical History:  Diagnosis Date  . Allergy   . Arthritis    "everywhere' - worse in  back  . Body mass index 39.0-39.9, adult   . CAD (coronary artery disease)   . Chronic back pain   . Cirrhosis, non-alcoholic ([place])   . Diabetes mellitus without complication ([place])   . Elevated liver enzymes   . GERD (gastroesophageal reflux disease)   . Heart murmur   . Hyperlipidemia   . Hypertension   . Hypogonadism in male   . Melanoma ([place])    melanoma   . Melanoma in situ of right shoulder ([place])   . Myasthenia gravis ([place]) 01/2018  . Renal stones   . Sleep apnea    CPAP  . Uses hearing aid    doesn't wear    PAST SURGICAL HISTORY:  Past Surgical History:  Procedure Laterality Date  . ANGIOPLASTY  1999   3 stents  . CARDIAC CATHETERIZATION     stents placed  . CARPAL TUNNEL RELEASE     x2  . CATARACT EXTRACTION, BILATERAL    . COLONOSCOPY WITH PROPOFOL N/A 07/03/2018   Procedure: COLONOSCOPY WITH PROPOFOL;  Surgeon: Lucilla Lame, MD;  Location: [facility];  Service: Endoscopy;  Laterality: N/A;  . ESOPHAGOGASTRODUODENOSCOPY (EGD) WITH PROPOFOL N/A 09/08/2015   Procedure: ESOPHAGOGASTRODUODENOSCOPY (EGD) WITH PROPOFOL;  Surgeon: Lucilla Lame, MD;  Location: [facility];  Service: Endoscopy;  Laterality: N/A;  CPAP Diabetic - oral meds  . ESOPHAGOGASTRODUODENOSCOPY (EGD) WITH PROPOFOL N/A 07/03/2018   Procedure: ESOPHAGOGASTRODUODENOSCOPY (EGD) WITH PROPOFOL;  Surgeon: Lucilla Lame, MD;  Location: [facility];  Service: Endoscopy;  Laterality: N/A;  diabetic - oral meds sleep apnea  . LIVER BIOPSY    . POLYPECTOMY N/A 07/03/2018   Procedure: POLYPECTOMY;  Surgeon: Lucilla Lame, MD;  Location: [facility];  Service: Endoscopy;  Laterality: N/A;  . SKIN LESION EXCISION  1998  . SKIN LESION EXCISION     skin cancer removed on forehead in June 2018  . TONSILLECTOMY     age 89  . TRANSURETHRAL RESECTION OF PROSTATE N/A 11/26/2018   Procedure: TRANSURETHRAL RESECTION OF THE PROSTATE (TURP);  Surgeon: Irine Seal, MD;  Location: [facility];   Service: Urology;  Laterality: N/A;    SOCIAL HISTORY:  Social History   Tobacco Use  . Smoking status: Former Smoker    Types: Cigarettes    Last attempt to quit: 04/02/1970    Years since quitting: 48.7  . Smokeless tobacco: Current User    Types: Chew  . Tobacco comment: approx quit in 1971   Substance Use Topics  . Alcohol use: No    Alcohol/week: 0.0 standard drinks    Comment: stopped in 1996    FAMILY HISTORY:  Family History  Problem Relation Age of Onset  . Thyroid disease Mother   . Alzheimer's disease Mother   . Heart disease Mother   . Cancer Father 51       testicular  . Heart disease Father   . Hypertension Father   . Heart attack Father   . Kidney disease Neg Hx   . Prostate cancer Neg Hx   . Kidney cancer Neg Hx   . Bladder Cancer Neg Hx     DRUG ALLERGIES:  Allergies  Allergen Reactions  . Altace [Ramipril] Cough    REVIEW OF SYSTEMS:   CONSTITUTIONAL: No fever, fatigue or weakness.  EYES: No blurred or double vision.  EARS, NOSE, AND THROAT: No tinnitus or ear pain.  RESPIRATORY: No cough, shortness of breath, wheezing or hemoptysis.  CARDIOVASCULAR: No chest pain, orthopnea, edema.  GASTROINTESTINAL: No nausea, vomiting, diarrhea or abdominal pain.  Has significant swelling on his abdomen and thighs. GENITOURINARY: No dysuria, hematuria.  ENDOCRINE: No polyuria, nocturia,  HEMATOLOGY: No anemia, easy bruising or bleeding SKIN: No rash or lesion. MUSCULOSKELETAL: No joint pain or arthritis.   NEUROLOGIC: No tingling, numbness, weakness.  PSYCHIATRY: No anxiety or depression.   MEDICATIONS AT HOME:  Prior to Admission medications   Medication Sig Start Date End Date Taking? Authorizing Provider  Calcium Carbonate-Vit D-Min (CALCIUM 1200 PO) Take 1,200 mg by mouth.   Yes [provider]  cefUROXime (CEFTIN) 250 MG tablet Take 1 tablet (250 mg total) by mouth 2 (two) times daily. 11/30/18 01/14/19 Yes Vaughan Basta, MD   glipiZIDE (GLUCOTROL) 5 MG tablet Take 1 tablet (5 mg total) by mouth daily before breakfast. 09/05/18  Yes Crissman, Jeannette How, MD  losartan (COZAAR) 100 MG tablet Take 1 tablet (100 mg total) by mouth daily. 09/05/18  Yes [name], MD  metFORMIN (GLUCOPHAGE) 500 MG tablet Take 2 tablets (1,000 mg total) by mouth 2 (two) times daily with a meal. 09/05/18  Yes Crissman, Jeannette How, MD  metoprolol succinate (TOPROL-XL) 50 MG 24 hr tablet Take 1 tablet (50 mg total) by mouth daily. Take with or immediately following a meal. 09/05/18  Yes Crissman, Jeannette How, MD  Multiple Vitamin (MULTIVITAMIN WITH MINERALS) TABS tablet Take 1 tablet by mouth daily. 12/01/18  Yes Vaughan Basta, MD  omeprazole (PRILOSEC) 40 MG capsule Take 40 mg  by mouth daily.  11/23/18  Yes [provider]  pyridostigmine (MESTINON) 60 MG tablet Take 60 mg by mouth 4 (four) times daily.   Yes [provider]  simvastatin (ZOCOR) 40 MG tablet Take 1 tablet (40 mg total) by mouth daily at 6 PM. 09/05/18  Yes Crissman, Jeannette How, MD  tamsulosin (FLOMAX) 0.4 MG CAPS capsule TAKE 1 CAPSULE(0.4 MG) BY MOUTH DAILY 08/09/18  Yes McGowan, Larene Beach A, PA-C  acetaminophen (TYLENOL) 325 MG tablet Place 2 tablets (650 mg total) into feeding tube every 6 (six) hours as needed for mild pain (or Fever >/= 101). 11/30/18   Vaughan Basta, MD      PHYSICAL EXAMINATION:   VITAL SIGNS: Blood pressure 105/60, pulse 87, temperature 98.5 F (36.9 C), temperature source Oral, resp. rate 18, height 5\' 9"  (1.753 m), weight 117 kg, SpO2 99 %.  GENERAL:  78 y.o.-year-old patient lying in the bed with no acute distress.  EYES: Pupils equal, round, reactive to light and accommodation. No scleral icterus. Extraocular muscles intact.  HEENT: Head atraumatic, normocephalic. Oropharynx and nasopharynx clear.  NECK:  Supple, no jugular venous distention. No thyroid enlargement, no tenderness.  LUNGS: Normal breath sounds bilaterally, no  wheezing, rales,rhonchi or crepitation. No use of accessory muscles of respiration.  CARDIOVASCULAR: S1, S2 normal. No murmurs, rubs, or gallops.  ABDOMEN: Soft, nontender, distended. Bowel sounds present. No organomegaly or mass.  EXTREMITIES: Significant bilateral pedal edema extending on legs, thighs, abdominal wall, scrotum, no cyanosis, or clubbing.  NEUROLOGIC: Cranial nerves II through XII are intact. Muscle strength 4/5 in all extremities. Sensation intact. Gait not checked.  PSYCHIATRIC: The patient is alert and oriented x 3.  SKIN: No obvious rash, lesion, or ulcer.   LABORATORY PANEL:   CBC Recent Labs  Lab 12/15/18 1202  WBC 4.9  HGB 8.9*  HCT 28.9*  PLT 115*  MCV 100.0  MCH 30.8  MCHC 30.8  RDW 17.9*   ------------------------------------------------------------------------------------------------------------------  Chemistries  Recent Labs  Lab 12/15/18 1202  NA 143  K 4.1  CL 111  CO2 24  GLUCOSE 116*  BUN 47*  CREATININE 2.04*  CALCIUM 8.9  AST 30  ALT 21  ALKPHOS 102  BILITOT 1.4*   ------------------------------------------------------------------------------------------------------------------ estimated creatinine clearance is 38.9 mL/min (A) (by C-G formula based on SCr of 2.04 mg/dL (H)). ------------------------------------------------------------------------------------------------------------------ No results for input(s): TSH, T4TOTAL, T3FREE, THYROIDAB in the last 72 hours.  Invalid input(s): FREET3   Coagulation profile No results for input(s): INR, PROTIME in the last 168 hours. ------------------------------------------------------------------------------------------------------------------- No results for input(s): DDIMER in the last 72 hours. -------------------------------------------------------------------------------------------------------------------  Cardiac Enzymes No results for input(s): CKMB, TROPONINI, MYOGLOBIN in  the last 168 hours.  Invalid input(s): CK ------------------------------------------------------------------------------------------------------------------ Invalid input(s): POCBNP  ---------------------------------------------------------------------------------------------------------------  Urinalysis    Component Value Date/Time   COLORURINE RED (A) 12/15/2018 1448   APPEARANCEUR CLOUDY (A) 12/15/2018 1448   APPEARANCEUR Cloudy (A) 11/10/2018 1301   LABSPEC 1.024 12/15/2018 1448   PHURINE  12/15/2018 1448    TEST NOT REPORTED DUE TO COLOR INTERFERENCE OF URINE PIGMENT   GLUCOSEU (A) 12/15/2018 1448    TEST NOT REPORTED DUE TO COLOR INTERFERENCE OF URINE PIGMENT   HGBUR (A) 12/15/2018 1448    TEST NOT REPORTED DUE TO COLOR INTERFERENCE OF URINE PIGMENT   BILIRUBINUR (A) 12/15/2018 1448    TEST NOT REPORTED DUE TO COLOR INTERFERENCE OF URINE PIGMENT   BILIRUBINUR Negative 11/10/2018 1301   KETONESUR (A) 12/15/2018 1448    TEST  NOT REPORTED DUE TO COLOR INTERFERENCE OF URINE PIGMENT   PROTEINUR (A) 12/15/2018 1448    TEST NOT REPORTED DUE TO COLOR INTERFERENCE OF URINE PIGMENT   NITRITE (A) 12/15/2018 1448    TEST NOT REPORTED DUE TO COLOR INTERFERENCE OF URINE PIGMENT   LEUKOCYTESUR (A) 12/15/2018 1448    TEST NOT REPORTED DUE TO COLOR INTERFERENCE OF URINE PIGMENT   LEUKOCYTESUR Trace (A) 11/10/2018 1301     RADIOLOGY: Ct Abdomen Pelvis Wo Contrast  Result Date: 12/15/2018 CLINICAL DATA:  History of recent TURP. EXAM: CT ABDOMEN AND PELVIS WITHOUT CONTRAST TECHNIQUE: Multidetector CT imaging of the abdomen and pelvis was performed following the standard protocol without IV contrast. COMPARISON:  CT scan 11/26/2018 FINDINGS: Lower chest: The heart is upper limits of normal in size and stable. Stable three-vessel coronary artery calcifications. There is a small left pleural effusion with overlying atelectasis. Hepatobiliary: Stable advanced cirrhotic changes involving the  liver but no obvious hepatic mass. The gallbladder is grossly normal. No common bile duct dilatation. Pancreas: No mass, inflammation or ductal dilatation. Moderate atrophy. Spleen: Stable splenomegaly. Adrenals/Urinary Tract: The adrenal glands and kidneys are unremarkable and stable. The bladder contains a Foley catheter. 200 cc of contrast was put in the bladder. No asymmetric bladder wall thickening or bladder mass is identified. The bladder wall is intact. There is extravasation of contrast into the prostate gland and 2 areas 1 in the left mid gland and 1 in the lower right gland. These are areas of previous cystic appearing cavities are abscesses on prior CT scan. Stomach/Bowel: The stomach, duodenum, small bowel and colon are grossly normal. No acute inflammatory changes, mass lesions or obstructive findings. Vascular/Lymphatic: Stable aortic calcifications. Stable scattered mesenteric and retroperitoneal lymph nodes. Reproductive: Expected surgical changes involving the prostate gland from the recent TURP. Extravasation of contrast into the prostate gland as stated above. Other: Large volume abdominal/pelvic ascites and diffuse body wall edema. Musculoskeletal: No significant bony findings. IMPRESSION: 1. The bladder is intact.  No bladder rupture or leakage. 2. Two areas of extravasation of contrast into the prostate gland. I assume this is normal after a TURP. 3. Advanced cirrhotic changes involving the liver with associated splenomegaly and large volume abdominal/pelvic ascites. Electronically Signed   By: Marijo Sanes M.D.   On: 12/15/2018 16:20   US Renal  Result Date: 12/15/2018 CLINICAL DATA:  78 y/o  M; hydronephrosis. EXAM: RENAL / URINARY TRACT ULTRASOUND COMPLETE COMPARISON:  11/26/2018 CT abdomen and pelvis. FINDINGS: Right Kidney: Renal measurements: 13.0 x 6.7 x 5.7 cm = volume: 259 mL . Echogenicity within normal limits. No mass or hydronephrosis visualized. Cortical atrophy. Lower pole  simple cyst measuring up to 2.8 cm. Left Kidney: Renal measurements: 12.1 x 6.9 x 5.2 cm = volume: 225 mL. Echogenicity within normal limits. No mass or hydronephrosis visualized. Cortical atrophy. Interpolar echogenic shadowing stone measuring up to 9 mm. Bladder: Not visualized.  Ascites. IMPRESSION: 1. Hydronephrosis. Mild cortical atrophy of bilateral kidneys. Left kidney interpolar 9 mm stone. 2. Ascites. Electronically Signed   By: Kristine Garbe M.D.   On: 12/15/2018 13:59    EKG: Orders placed or performed during the hospital encounter of 11/26/18  . EKG 12-Lead  . EKG 12-Lead    IMPRESSION AND PLAN:  *Acute renal failure Gentle IV hydration I do feel patient has renal failure due to third spacing secondary to his liver cirrhosis. He has large ascites, ultrasound renal does not show hydronephrosis or obstruction. I will call  nephrology consult and follow his intake and output strictly.  *Large ascites This is secondary to nonalcoholic liver cirrhosis This is first time, I will call GI consult. Ultrasound-guided paracentesis ordered but as after 5 PM-radiologist is gone. Ultrasound technician suggested we need to call the on-call radiologist tomorrow to have it done. I have ordered some labs to be drawn from a sciatic fluid.  *UTI, prostate abscess recently, recent bacteremia He was discharged on oral Ceftin I will continue the same while here.  *Anasarca Most likely this is secondary to his liver cirrhosis but we do not have cardiac work-up done in last few years, I will get an echocardiogram and if any abnormality then we may need to call the cardiology consult.  *Hypertension Currently patient is running hypotensive or lower normal limit, I will hold antihypertensive medications for now.  *Diabetes Because of acute renal failure I will hold oral medication and keep on sliding scale coverage.  *Hyperlipidemia Continue statin.  *Myasthenia gravis Continue  pyridostigmine.  All the records are reviewed and case discussed with ED provider. Management plans discussed with the patient, family and they are in agreement.  CODE STATUS: DNR. Code Status History    Date Active Date Inactive Code Status Order ID Comments User Context   11/26/2018 1142 11/30/2018 2011 Full Code [id number]  Saundra Shelling, MD Inpatient   06/23/2016 1427 06/25/2016 1722 Full Code [id number]  Epifanio Lesches, MD ED     Patient's wife was present in the room during my visit.  TOTAL TIME TAKING CARE OF THIS PATIENT: 50 minutes.    Vaughan Basta M.D on 12/15/2018   Between 7am to 6pm - Pager - [phone]  After 6pm go to [email or web address] - password [email or web address] [facility]  Office  [phone]  CC: Primary care physician; [name], MD   Note: This dictation was prepared with Dragon dictation along with smaller phrase technology. Any transcriptional errors that result from this process are unintentional.

## 2018-12-15 NOTE — ED Notes (Signed)
Patient transported to CT 

## 2018-12-15 NOTE — ED Triage Notes (Addendum)
Sent from Urologist, foley intact.  At urology they bladder scanned with approx 482ml inserted a foley with no urine return.  Sent here to see if it was placed right. Also sent here for eval of rectal bleeding (under Dr.Wahl).

## 2018-12-16 ENCOUNTER — Inpatient Hospital Stay: Payer: Medicare Other

## 2018-12-16 ENCOUNTER — Inpatient Hospital Stay
Admit: 2018-12-16 | Discharge: 2018-12-16 | Disposition: A | Discharge status: Home / Self Care | Payer: Medicare Other | Attending: Internal Medicine | Admitting: Internal Medicine

## 2018-12-16 DIAGNOSIS — I2510 Atherosclerotic heart disease of native coronary artery without angina pectoris: Secondary | ICD-10-CM

## 2018-12-16 DIAGNOSIS — R188 Other ascites: Secondary | ICD-10-CM

## 2018-12-16 LAB — BASIC METABOLIC PANEL
ANION GAP: 5 (ref 5–15)
BUN: 43 mg/dL — ABNORMAL HIGH (ref 8–23)
CO2: 26 mmol/L (ref 22–32)
Calcium: 8.4 mg/dL — ABNORMAL LOW (ref 8.9–10.3)
Chloride: 112 mmol/L — ABNORMAL HIGH (ref 98–111)
Creatinine, Ser: 1.86 mg/dL — ABNORMAL HIGH (ref 0.61–1.24)
GFR calc Af Amer: 40 mL/min — ABNORMAL LOW (ref >60)
GFR calc non Af Amer: 34 mL/min — ABNORMAL LOW (ref >60)
Glucose, Bld: 112 mg/dL — ABNORMAL HIGH (ref 70–99)
Potassium: 4.6 mmol/L (ref 3.5–5.1)
Sodium: 143 mmol/L (ref 135–145)

## 2018-12-16 LAB — CBC
HCT: 23.5 % — ABNORMAL LOW (ref 39.0–52.0)
Hemoglobin: 7.1 g/dL — ABNORMAL LOW (ref 13.0–17.0)
MCH: 30.3 pg (ref 26.0–34.0)
MCHC: 30.2 g/dL (ref 30.0–36.0)
MCV: 100.4 fL — ABNORMAL HIGH (ref 80.0–100.0)
Platelets: 80 10*3/uL — ABNORMAL LOW (ref 150–400)
RBC: 2.34 MIL/uL — ABNORMAL LOW (ref 4.22–5.81)
RDW: 17.7 % — ABNORMAL HIGH (ref 11.5–15.5)
WBC: 2.9 10*3/uL — ABNORMAL LOW (ref 4.0–10.5)
nRBC: 0 % (ref 0.0–0.2)

## 2018-12-16 LAB — URINE CULTURE: Culture: NO GROWTH

## 2018-12-16 LAB — GLUCOSE, CAPILLARY
Glucose-Capillary: 98 mg/dL (ref 70–99)
Glucose-Capillary: 180 mg/dL — ABNORMAL HIGH (ref 70–99)
Glucose-Capillary: 163 mg/dL — ABNORMAL HIGH (ref 70–99)
Glucose-Capillary: 178 mg/dL — ABNORMAL HIGH (ref 70–99)

## 2018-12-16 MED ORDER — LOPERAMIDE HCL 2 MG PO CAPS
2.0000 mg | ORAL_CAPSULE | Freq: Four times a day (QID) | ORAL | Status: DC | PRN
  Administered 2018-12-16 – 2018-12-19 (×3): 2 mg via ORAL
  Filled 2018-12-16 (×4): qty 1

## 2018-12-16 MED ORDER — ALBUMIN HUMAN 25 % IV SOLN
50.0000 g | Freq: Once | INTRAVENOUS | Status: AC
  Administered 2018-12-17: 50 g via INTRAVENOUS
  Filled 2018-12-16: qty 200

## 2018-12-16 MED ORDER — PERFLUTREN LIPID MICROSPHERE
1.0000 mL | INTRAVENOUS | Status: AC | PRN
  Administered 2018-12-16: 3 mL via INTRAVENOUS
  Filled 2018-12-16: qty 10

## 2018-12-16 MED ORDER — SODIUM CHLORIDE 0.9 % IV SOLN
200.0000 mg | Freq: Once | INTRAVENOUS | Status: AC
  Administered 2018-12-16: 200 mg via INTRAVENOUS
  Filled 2018-12-16: qty 10

## 2018-12-16 NOTE — Progress Notes (Signed)
*  PRELIMINARY RESULTS* Echocardiogram 2D Echocardiogram has been performed. Definity IV Contrast used on this study.  Mason Wilson 12/16/2018, 4:40 PM

## 2018-12-16 NOTE — Progress Notes (Signed)
Notify Dr. Bonna Gains and Dr. Tressia Miners about patient had a bowel movement and there is a bloody discharge on the pad. BM is still brown, notice his penis is leaking  blood. Patient had a recent TURP and foley was place yesterday and per patient he had a trauma when they where placing foley. Urine is red tinge but that wasn't new, as it was like that yesterday. Patient's hemoglobin is 7.1 and received iron infusion this afternoon. CBC is ordered for yesterday. RN will continue to monitor.

## 2018-12-16 NOTE — Consult Note (Signed)
[facility] CONSULT NOTE    Date: 12/16/2018                  Patient Name:  Mason Wilson.  MRN: [id number]  DOB: 1941/02/08  Age / Sex: 78 y.o., male         PCP: [name], MD                 Service Requesting Consult: Hospitalist                 Reason for Consult: Acute renal failure            History of Present Illness: Patient is a 78 y.o. male with a PMHx of coronary artery disease, myasthenia gravis, chronic back pain, nonalcoholic cirrhosis, diabetes mellitus type 2, GERD, hyperlipidemia, hypertension, history of melanoma, nephrolithiasis who was admitted to [facility] on 12/15/2018 for evaluation of rectal bleeding and acute renal failure.  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  He now appears to have some extravasation of contrast within the prostate.  However no obstruction noted.  His baseline creatinine is 0.97.  Creatinine currently 1.87.  In addition patient was given furosemide.  Hemoglobin also down to 7.1 as the patient has been having rectal bleeding.  Gastroenterology has been consulted.   Medications: Outpatient medications: Medications Prior to Admission  Medication Sig Dispense Refill Last Dose  . Calcium Carbonate-Vit D-Min (CALCIUM 1200 PO) Take 1,200 mg by mouth.   12/15/2018 at 0800  . cefUROXime (CEFTIN) 250 MG tablet Take 1 tablet (250 mg total) by mouth 2 (two) times daily. 90 tablet 0 12/15/2018 at 0800  . glipiZIDE (GLUCOTROL) 5 MG tablet Take 1 tablet (5 mg total) by mouth daily before breakfast. 90 tablet 4 12/15/2018 at 0800  . losartan (COZAAR) 100 MG tablet Take 1 tablet (100 mg total) by mouth daily. 90 tablet 4 12/15/2018 at 0800  . metFORMIN (GLUCOPHAGE) 500 MG tablet Take 2 tablets (1,000 mg total) by mouth 2 (two) times daily with a meal. 360 tablet 4 12/15/2018 at 0800  . metoprolol succinate (TOPROL-XL) 50 MG 24 hr tablet Take 1 tablet (50 mg total) by mouth daily. Take with or immediately  following a meal. 90 tablet 4 12/15/2018 at 0800  . Multiple Vitamin (MULTIVITAMIN WITH MINERALS) TABS tablet Take 1 tablet by mouth daily. 30 tablet 0 12/15/2018 at 0800  . omeprazole (PRILOSEC) 40 MG capsule Take 40 mg by mouth daily.    12/15/2018 at 0800  . pyridostigmine (MESTINON) 60 MG tablet Take 60 mg by mouth 4 (four) times daily.   12/15/2018 at 0800  . simvastatin (ZOCOR) 40 MG tablet Take 1 tablet (40 mg total) by mouth daily at 6 PM. 90 tablet 4 12/14/2018 at 1600  . tamsulosin (FLOMAX) 0.4 MG CAPS capsule TAKE 1 CAPSULE(0.4 MG) BY MOUTH DAILY 90 capsule 3 12/14/2018 at 1600  . acetaminophen (TYLENOL) 325 MG tablet Place 2 tablets (650 mg total) into feeding tube every 6 (six) hours as needed for mild pain (or Fever >/= 101). 10 tablet 0 prn at prn    Current medications: Current Facility-Administered Medications  Medication Dose Route Frequency Provider Last Rate Last Dose  . cefdinir (OMNICEF) capsule 300 mg  300 mg Oral Q12H Vaughan Basta, MD   300 mg at 12/16/18 0857  . docusate sodium (COLACE) capsule 100 mg  100 mg Oral BID PRN Vaughan Basta, MD      .  insulin aspart (novoLOG) injection 0-5 Units  0-5 Units Subcutaneous QHS Vaughan Basta, MD      . insulin aspart (novoLOG) injection 0-9 Units  0-9 Units Subcutaneous TID WC Vaughan Basta, MD   2 Units at 12/16/18 1216  . ipratropium-albuterol (DUONEB) 0.5-2.5 (3) MG/3ML nebulizer solution 3 mL  3 mL Nebulization Q4H PRN Lance Coon, MD      . iron sucrose (VENOFER) 200 mg in sodium chloride 0.9 % 150 mL IVPB  200 mg Intravenous Once Henreitta Leber, MD 106 mL/hr at 12/16/18 1225 200 mg at 12/16/18 1225  . multivitamin with minerals tablet 1 tablet  1 tablet Oral Daily Vaughan Basta, MD   1 tablet at 12/16/18 0856  . pantoprazole (PROTONIX) EC tablet 40 mg  40 mg Oral Daily Vaughan Basta, MD   40 mg at 12/16/18 0856  . pyridostigmine (MESTINON) tablet 60 mg  60 mg Oral QID Vaughan Basta, MD   60 mg at 12/16/18 0857  . simvastatin (ZOCOR) tablet 40 mg  40 mg Oral q1800 Vaughan Basta, MD      . tamsulosin (FLOMAX) capsule 0.4 mg  0.4 mg Oral Daily Vaughan Basta, MD   0.4 mg at 12/16/18 0856  . traMADol (ULTRAM) tablet 50 mg  50 mg Oral Q12H PRN Lance Coon, MD          Allergies: Allergies  Allergen Reactions  . Altace [Ramipril] Cough      Past Medical History: Past Medical History:  Diagnosis Date  . Allergy   . Arthritis    "everywhere' - worse in back  . Body mass index 39.0-39.9, adult   . CAD (coronary artery disease)   . Chronic back pain   . Cirrhosis, non-alcoholic ([place])   . Diabetes mellitus without complication ([place])   . Elevated liver enzymes   . GERD (gastroesophageal reflux disease)   . Heart murmur   . Hyperlipidemia   . Hypertension   . Hypogonadism in male   . Melanoma ([place])    melanoma   . Melanoma in situ of right shoulder ([place])   . Myasthenia gravis ([place]) 01/2018  . Renal stones   . Sleep apnea    CPAP  . Uses hearing aid    doesn't wear     Past Surgical History: Past Surgical History:  Procedure Laterality Date  . ANGIOPLASTY  1999   3 stents  . CARDIAC CATHETERIZATION     stents placed  . CARPAL TUNNEL RELEASE     x2  . CATARACT EXTRACTION, BILATERAL    . COLONOSCOPY WITH PROPOFOL N/A 07/03/2018   Procedure: COLONOSCOPY WITH PROPOFOL;  Surgeon: Lucilla Lame, MD;  Location: [facility];  Service: Endoscopy;  Laterality: N/A;  . ESOPHAGOGASTRODUODENOSCOPY (EGD) WITH PROPOFOL N/A 09/08/2015   Procedure: ESOPHAGOGASTRODUODENOSCOPY (EGD) WITH PROPOFOL;  Surgeon: Lucilla Lame, MD;  Location: [facility];  Service: Endoscopy;  Laterality: N/A;  CPAP Diabetic - oral meds  . ESOPHAGOGASTRODUODENOSCOPY (EGD) WITH PROPOFOL N/A 07/03/2018   Procedure: ESOPHAGOGASTRODUODENOSCOPY (EGD) WITH PROPOFOL;  Surgeon: Lucilla Lame, MD;  Location: [facility];  Service: Endoscopy;   Laterality: N/A;  diabetic - oral meds sleep apnea  . LIVER BIOPSY    . POLYPECTOMY N/A 07/03/2018   Procedure: POLYPECTOMY;  Surgeon: Lucilla Lame, MD;  Location: [facility];  Service: Endoscopy;  Laterality: N/A;  . SKIN LESION EXCISION  1998  . SKIN LESION EXCISION     skin cancer removed on forehead in June 2018  .  TONSILLECTOMY     age 89  . TRANSURETHRAL RESECTION OF PROSTATE N/A 11/26/2018   Procedure: TRANSURETHRAL RESECTION OF THE PROSTATE (TURP);  Surgeon: Irine Seal, MD;  Location: [facility];  Service: Urology;  Laterality: N/A;     Family History: Family History  Problem Relation Age of Onset  . Thyroid disease Mother   . Alzheimer's disease Mother   . Heart disease Mother   . Cancer Father 4       testicular  . Heart disease Father   . Hypertension Father   . Heart attack Father   . Kidney disease Neg Hx   . Prostate cancer Neg Hx   . Kidney cancer Neg Hx   . Bladder Cancer Neg Hx      Social History: Social History   Socioeconomic History  . Marital status: Married    Spouse name: Not on file  . Number of children: Not on file  . Years of education: Not on file  . Highest education level: Not on file  Occupational History  . Not on file  Social Needs  . Financial resource strain: Not hard at all  . Food insecurity:    Worry: Never true    Inability: Never true  . Transportation needs:    Medical: No    Non-medical: No  Tobacco Use  . Smoking status: Former Smoker    Types: Cigarettes    Last attempt to quit: 04/02/1970    Years since quitting: 48.7  . Smokeless tobacco: Current User    Types: Chew  . Tobacco comment: approx quit in 1971   Substance and Sexual Activity  . Alcohol use: No    Alcohol/week: 0.0 standard drinks    Comment: stopped in 1996  . Drug use: No  . Sexual activity: Yes    Partners: Female  Lifestyle  . Physical activity:    Days per week: 0 days    Minutes per session: 0 min  . Stress: Not at all   Relationships  . Social connections:    Talks on phone: More than three times a week    Gets together: Once a week    Attends religious service: Never    Active member of club or organization: No    Attends meetings of clubs or organizations: Never    Relationship status: Married  . Intimate partner violence:    Fear of current or ex partner: No    Emotionally abused: No    Physically abused: No    Forced sexual activity: No  Other Topics Concern  . Not on file  Social History Narrative  . Not on file     Review of Systems: Review of Systems  Constitutional: Positive for malaise/fatigue. Negative for fever.  HENT: Negative for congestion, hearing loss and tinnitus.   Eyes: Negative for blurred vision.  Respiratory: Negative for cough, hemoptysis and shortness of breath.   Cardiovascular: Positive for leg swelling. Negative for chest pain and palpitations.  Gastrointestinal: Positive for blood in stool. Negative for heartburn and nausea.  Genitourinary: Positive for dysuria. Negative for frequency and hematuria.  Musculoskeletal: Negative for joint pain and myalgias.  Skin: Negative for itching and rash.  Neurological: Positive for weakness. Negative for dizziness.  Endo/Heme/Allergies: Negative for polydipsia. Does not bruise/bleed easily.  Psychiatric/Behavioral: Negative for depression. The patient is not nervous/anxious.      Vital Signs: Blood pressure 117/60, pulse 96, temperature 98.5 F (36.9 C), temperature source Oral, resp. rate 18, height  5\' 9"  (1.753 m), weight 114.3 kg, SpO2 98 %.  Weight trends: Filed Weights   12/15/18 1157 12/15/18 2026 12/16/18 0431  Weight: 117 kg 116 kg 114.3 kg    Physical Exam: General: NAD, laying in bed  Head: Normocephalic, atraumatic.  Eyes: Anicteric, EOMI  Nose: Mucous membranes moist, not inflammed, nonerythematous.  Throat: Oropharynx nonerythematous, no exudate appreciated.   Neck: Supple, trachea midline.  Lungs:   Normal respiratory effort. Clear to auscultation BL without crackles or wheezes.  Heart: RRR. S1 and S2 normal without gallop, murmur, or rubs.  Abdomen:  Distended, BS present, no rebound  Extremities: 2+ pretibial edema.  Neurologic: A&O X3, Motor strength is 5/5 in the all 4 extremities  Skin: Warm, dry    Lab results: Basic Metabolic Panel: Recent Labs  Lab 12/15/18 1202 12/16/18 0405  NA 143 143  K 4.1 4.6  CL 111 112*  CO2 24 26  GLUCOSE 116* 112*  BUN 47* 43*  CREATININE 2.04* 1.86*  CALCIUM 8.9 8.4*    Liver Function Tests: Recent Labs  Lab 12/15/18 1202  AST 30  ALT 21  ALKPHOS 102  BILITOT 1.4*  PROT 6.2*  ALBUMIN 3.0*   No results for input(s): LIPASE, AMYLASE in the last 168 hours. No results for input(s): AMMONIA in the last 168 hours.  CBC: Recent Labs  Lab 12/15/18 1202 12/16/18 0405  WBC 4.9 2.9*  HGB 8.9* 7.1*  HCT 28.9* 23.5*  MCV 100.0 100.4*  PLT 115* 80*    Cardiac Enzymes: No results for input(s): CKTOTAL, CKMB, CKMBINDEX, TROPONINI in the last 168 hours.  BNP: Invalid input(s): POCBNP  CBG: Recent Labs  Lab 12/15/18 2026 12/16/18 0830 12/16/18 1206  GLUCAP 128* 98 180*    Microbiology: Results for orders placed or performed during the hospital encounter of 12/15/18  C difficile quick scan w PCR reflex     Status: None   Collection Time: 12/15/18  6:10 PM  Result Value Ref Range Status   C Diff antigen NEGATIVE NEGATIVE Final   C Diff toxin NEGATIVE NEGATIVE Final   C Diff interpretation No C. difficile detected.  Final    Comment: Performed at [facility], [address] [place], [place] [address]    Coagulation Studies: No results for input(s): LABPROT, INR in the last 72 hours.  Urinalysis: Recent Labs    12/15/18 1448  COLORURINE RED*  LABSPEC 1.024  PHURINE TEST NOT REPORTED DUE TO COLOR INTERFERENCE OF URINE PIGMENT  GLUCOSEU TEST NOT REPORTED DUE TO COLOR INTERFERENCE OF URINE PIGMENT*   HGBUR TEST NOT REPORTED DUE TO COLOR INTERFERENCE OF URINE PIGMENT*  BILIRUBINUR TEST NOT REPORTED DUE TO COLOR INTERFERENCE OF URINE PIGMENT*  KETONESUR TEST NOT REPORTED DUE TO COLOR INTERFERENCE OF URINE PIGMENT*  PROTEINUR TEST NOT REPORTED DUE TO COLOR INTERFERENCE OF URINE PIGMENT*  NITRITE TEST NOT REPORTED DUE TO COLOR INTERFERENCE OF URINE PIGMENT*  LEUKOCYTESUR TEST NOT REPORTED DUE TO COLOR INTERFERENCE OF URINE PIGMENT*      Imaging: Ct Abdomen Pelvis Wo Contrast  Result Date: 12/15/2018 CLINICAL DATA:  History of recent TURP. EXAM: CT ABDOMEN AND PELVIS WITHOUT CONTRAST TECHNIQUE: Multidetector CT imaging of the abdomen and pelvis was performed following the standard protocol without IV contrast. COMPARISON:  CT scan 11/26/2018 FINDINGS: Lower chest: The heart is upper limits of normal in size and stable. Stable three-vessel coronary artery calcifications. There is a small left pleural effusion with overlying atelectasis. Hepatobiliary: Stable advanced cirrhotic changes involving  the liver but no obvious hepatic mass. The gallbladder is grossly normal. No common bile duct dilatation. Pancreas: No mass, inflammation or ductal dilatation. Moderate atrophy. Spleen: Stable splenomegaly. Adrenals/Urinary Tract: The adrenal glands and kidneys are unremarkable and stable. The bladder contains a Foley catheter. 200 cc of contrast was put in the bladder. No asymmetric bladder wall thickening or bladder mass is identified. The bladder wall is intact. There is extravasation of contrast into the prostate gland and 2 areas 1 in the left mid gland and 1 in the lower right gland. These are areas of previous cystic appearing cavities are abscesses on prior CT scan. Stomach/Bowel: The stomach, duodenum, small bowel and colon are grossly normal. No acute inflammatory changes, mass lesions or obstructive findings. Vascular/Lymphatic: Stable aortic calcifications. Stable scattered mesenteric and  retroperitoneal lymph nodes. Reproductive: Expected surgical changes involving the prostate gland from the recent TURP. Extravasation of contrast into the prostate gland as stated above. Other: Large volume abdominal/pelvic ascites and diffuse body wall edema. Musculoskeletal: No significant bony findings. IMPRESSION: 1. The bladder is intact.  No bladder rupture or leakage. 2. Two areas of extravasation of contrast into the prostate gland. I assume this is normal after a TURP. 3. Advanced cirrhotic changes involving the liver with associated splenomegaly and large volume abdominal/pelvic ascites. Electronically Signed   By: Marijo Sanes M.D.   On: 12/15/2018 16:20   US Renal  Result Date: 12/15/2018 CLINICAL DATA:  78 y/o  M; hydronephrosis. EXAM: RENAL / URINARY TRACT ULTRASOUND COMPLETE COMPARISON:  11/26/2018 CT abdomen and pelvis. FINDINGS: Right Kidney: Renal measurements: 13.0 x 6.7 x 5.7 cm = volume: 259 mL . Echogenicity within normal limits. No mass or hydronephrosis visualized. Cortical atrophy. Lower pole simple cyst measuring up to 2.8 cm. Left Kidney: Renal measurements: 12.1 x 6.9 x 5.2 cm = volume: 225 mL. Echogenicity within normal limits. No mass or hydronephrosis visualized. Cortical atrophy. Interpolar echogenic shadowing stone measuring up to 9 mm. Bladder: Not visualized.  Ascites. IMPRESSION: 1. Hydronephrosis. Mild cortical atrophy of bilateral kidneys. Left kidney interpolar 9 mm stone. 2. Ascites. Electronically Signed   By: Kristine Garbe M.D.   On: 12/15/2018 13:59   US Venous Img Lower Bilateral  Result Date: 12/16/2018 CLINICAL DATA:  78 year old male with bilateral lower extremity edema EXAM: BILATERAL LOWER EXTREMITY VENOUS DOPPLER ULTRASOUND TECHNIQUE: Gray-scale sonography with graded compression, as well as color Doppler and duplex ultrasound were performed to evaluate the lower extremity deep venous systems from the level of the common femoral vein and including  the common femoral, femoral, profunda femoral, popliteal and calf veins including the posterior tibial, peroneal and gastrocnemius veins when visible. The superficial great saphenous vein was also interrogated. Spectral Doppler was utilized to evaluate flow at rest and with distal augmentation maneuvers in the common femoral, femoral and popliteal veins. COMPARISON:  None. FINDINGS: RIGHT LOWER EXTREMITY Common Femoral Vein: No evidence of thrombus. Normal compressibility, respiratory phasicity and response to augmentation. Saphenofemoral Junction: No evidence of thrombus. Normal compressibility and flow on color Doppler imaging. Profunda Femoral Vein: No evidence of thrombus. Normal compressibility and flow on color Doppler imaging. Femoral Vein: No evidence of thrombus. Normal compressibility, respiratory phasicity and response to augmentation. Popliteal Vein: No evidence of thrombus. Normal compressibility, respiratory phasicity and response to augmentation. Calf Veins: No evidence of thrombus. Normal compressibility and flow on color Doppler imaging. Superficial Great Saphenous Vein: No evidence of thrombus. Normal compressibility. Venous Reflux:  None. Other Findings:  None. LEFT LOWER  EXTREMITY Common Femoral Vein: No evidence of thrombus. Normal compressibility, respiratory phasicity and response to augmentation. Saphenofemoral Junction: No evidence of thrombus. Normal compressibility and flow on color Doppler imaging. Profunda Femoral Vein: No evidence of thrombus. Normal compressibility and flow on color Doppler imaging. Femoral Vein: No evidence of thrombus. Normal compressibility, respiratory phasicity and response to augmentation. Popliteal Vein: No evidence of thrombus. Normal compressibility, respiratory phasicity and response to augmentation. Calf Veins: No evidence of thrombus. Normal compressibility and flow on color Doppler imaging. Superficial Great Saphenous Vein: No evidence of thrombus. Normal  compressibility. Venous Reflux:  None. Other Findings:  None. IMPRESSION: No evidence of deep venous thrombosis. Electronically Signed   By: Jacqulynn Cadet M.D.   On: 12/16/2018 09:42      Assessment & Plan: Pt is a 78 y.o. male coronary artery disease, myasthenia gravis, chronic back pain, nonalcoholic cirrhosis, diabetes mellitus type 2, GERD, hyperlipidemia, hypertension, history of melanoma, nephrolithiasis who was admitted to [facility] on 12/15/2018 for evaluation of rectal bleeding and acute renal failure.  1.  Acute renal failure, likely multifactorial with the possibility of urinary retention, relative hypotension, and blood loss from GI bleed. 2.  Anemia specified/GI bleed. 3.  Cirrhosis of the liver. 4.  Generalized edema.  Plan: We are asked to see the patient for evaluation management of acute renal failure.  There was some suggestion of underlying urinary retention however patient did not have any hydronephrosis on renal ultrasound.  Foley catheter nonetheless has been placed.  Creatinine is slightly better today at 1.86.  Hold off on IV fluid hydration for now given generalized edema.  We could potentially consider albumin if volume is needed.  Consider blood transfusion if hemoglobin drops any further.  Gastroenterology has been consulted.  Otherwise avoid nephrotoxins as possible.

## 2018-12-16 NOTE — Progress Notes (Signed)
[place] [facility] [place] NAME: Mason Wilson    MR#:  [id number]  DATE OF BIRTH:  [birth year]  SUBJECTIVE:   Pt. Still has some shortness of breath and abdominal distension.  Wife at bedside.  No other complaints presently.    REVIEW OF SYSTEMS:    Review of Systems  Constitutional: Negative for chills and fever.  HENT: Negative for congestion and tinnitus.   Eyes: Negative for blurred vision and double vision.  Respiratory: Negative for cough, shortness of breath and wheezing.   Cardiovascular: Positive for leg swelling. Negative for chest pain, orthopnea and PND.  Gastrointestinal: Negative for abdominal pain, diarrhea, nausea and vomiting.       Abdominal distension.   Genitourinary: Negative for dysuria and hematuria.  Neurological: Negative for dizziness, sensory change and focal weakness.  All other systems reviewed and are negative.   Nutrition: 2 gm sodium.  Tolerating Diet: Yes Tolerating PT: Await Eval.   DRUG ALLERGIES:   Allergies  Allergen Reactions  . Altace [Ramipril] Cough    VITALS:  Blood pressure 117/60, pulse 96, temperature 98.5 F (36.9 C), temperature source Oral, resp. rate 18, height 5\' 9"  (1.753 m), weight 114.3 kg, SpO2 98 %.  PHYSICAL EXAMINATION:   Physical Exam  GENERAL:  78 y.o.-year-old obese patient lying in bed in no acute distress.  EYES: Pupils equal, round, reactive to light and accommodation. No scleral icterus. Extraocular muscles intact.  HEENT: Head atraumatic, normocephalic. Oropharynx and nasopharynx clear.  NECK:  Supple, no jugular venous distention. No thyroid enlargement, no tenderness.  LUNGS: Normal breath sounds bilaterally, no wheezing, rales, rhonchi. No use of accessory muscles of respiration.  CARDIOVASCULAR: S1, S2 normal. No murmurs, rubs, or gallops.  ABDOMEN: Soft, nontender, distended, + fluid wave with ascites. Bowel sounds present. No organomegaly or mass.    EXTREMITIES: No cyanosis, clubbing, + 2 edema b/l.    NEUROLOGIC: Cranial nerves II through XII are intact. No focal Motor or sensory deficits b/l.  Globally weak.  PSYCHIATRIC: The patient is alert and oriented x 3.  SKIN: No obvious rash, lesion, or ulcer.    LABORATORY PANEL:   CBC Recent Labs  Lab 12/16/18 0405  WBC 2.9*  HGB 7.1*  HCT 23.5*  PLT 80*   ------------------------------------------------------------------------------------------------------------------  Chemistries  Recent Labs  Lab 12/15/18 1202 12/16/18 0405  NA 143 143  K 4.1 4.6  CL 111 112*  CO2 24 26  GLUCOSE 116* 112*  BUN 47* 43*  CREATININE 2.04* 1.86*  CALCIUM 8.9 8.4*  AST 30  --   ALT 21  --   ALKPHOS 102  --   BILITOT 1.4*  --    ------------------------------------------------------------------------------------------------------------------  Cardiac Enzymes No results for input(s): TROPONINI in the last 168 hours. ------------------------------------------------------------------------------------------------------------------  RADIOLOGY:  Ct Abdomen Pelvis Wo Contrast  Result Date: 12/15/2018 CLINICAL DATA:  History of recent TURP. EXAM: CT ABDOMEN AND PELVIS WITHOUT CONTRAST TECHNIQUE: Multidetector CT imaging of the abdomen and pelvis was performed following the standard protocol without IV contrast. COMPARISON:  CT scan 11/26/2018 FINDINGS: Lower chest: The heart is upper limits of normal in size and stable. Stable three-vessel coronary artery calcifications. There is a small left pleural effusion with overlying atelectasis. Hepatobiliary: Stable advanced cirrhotic changes involving the liver but no obvious hepatic mass. The gallbladder is grossly normal. No common bile duct dilatation. Pancreas: No mass, inflammation or ductal dilatation. Moderate atrophy. Spleen: Stable splenomegaly. Adrenals/Urinary Tract: The adrenal glands  and kidneys are unremarkable and stable. The bladder  contains a Foley catheter. 200 cc of contrast was put in the bladder. No asymmetric bladder wall thickening or bladder mass is identified. The bladder wall is intact. There is extravasation of contrast into the prostate gland and 2 areas 1 in the left mid gland and 1 in the lower right gland. These are areas of previous cystic appearing cavities are abscesses on prior CT scan. Stomach/Bowel: The stomach, duodenum, small bowel and colon are grossly normal. No acute inflammatory changes, mass lesions or obstructive findings. Vascular/Lymphatic: Stable aortic calcifications. Stable scattered mesenteric and retroperitoneal lymph nodes. Reproductive: Expected surgical changes involving the prostate gland from the recent TURP. Extravasation of contrast into the prostate gland as stated above. Other: Large volume abdominal/pelvic ascites and diffuse body wall edema. Musculoskeletal: No significant bony findings. IMPRESSION: 1. The bladder is intact.  No bladder rupture or leakage. 2. Two areas of extravasation of contrast into the prostate gland. I assume this is normal after a TURP. 3. Advanced cirrhotic changes involving the liver with associated splenomegaly and large volume abdominal/pelvic ascites. Electronically Signed   By: Marijo Sanes M.D.   On: 12/15/2018 16:20   US Renal  Result Date: 12/15/2018 CLINICAL DATA:  78 y/o  M; hydronephrosis. EXAM: RENAL / URINARY TRACT ULTRASOUND COMPLETE COMPARISON:  11/26/2018 CT abdomen and pelvis. FINDINGS: Right Kidney: Renal measurements: 13.0 x 6.7 x 5.7 cm = volume: 259 mL . Echogenicity within normal limits. No mass or hydronephrosis visualized. Cortical atrophy. Lower pole simple cyst measuring up to 2.8 cm. Left Kidney: Renal measurements: 12.1 x 6.9 x 5.2 cm = volume: 225 mL. Echogenicity within normal limits. No mass or hydronephrosis visualized. Cortical atrophy. Interpolar echogenic shadowing stone measuring up to 9 mm. Bladder: Not visualized.  Ascites.  IMPRESSION: 1. Hydronephrosis. Mild cortical atrophy of bilateral kidneys. Left kidney interpolar 9 mm stone. 2. Ascites. Electronically Signed   By: Kristine Garbe M.D.   On: 12/15/2018 13:59   US Venous Img Lower Bilateral  Result Date: 12/16/2018 CLINICAL DATA:  78 year old male with bilateral lower extremity edema EXAM: BILATERAL LOWER EXTREMITY VENOUS DOPPLER ULTRASOUND TECHNIQUE: Gray-scale sonography with graded compression, as well as color Doppler and duplex ultrasound were performed to evaluate the lower extremity deep venous systems from the level of the common femoral vein and including the common femoral, femoral, profunda femoral, popliteal and calf veins including the posterior tibial, peroneal and gastrocnemius veins when visible. The superficial great saphenous vein was also interrogated. Spectral Doppler was utilized to evaluate flow at rest and with distal augmentation maneuvers in the common femoral, femoral and popliteal veins. COMPARISON:  None. FINDINGS: RIGHT LOWER EXTREMITY Common Femoral Vein: No evidence of thrombus. Normal compressibility, respiratory phasicity and response to augmentation. Saphenofemoral Junction: No evidence of thrombus. Normal compressibility and flow on color Doppler imaging. Profunda Femoral Vein: No evidence of thrombus. Normal compressibility and flow on color Doppler imaging. Femoral Vein: No evidence of thrombus. Normal compressibility, respiratory phasicity and response to augmentation. Popliteal Vein: No evidence of thrombus. Normal compressibility, respiratory phasicity and response to augmentation. Calf Veins: No evidence of thrombus. Normal compressibility and flow on color Doppler imaging. Superficial Great Saphenous Vein: No evidence of thrombus. Normal compressibility. Venous Reflux:  None. Other Findings:  None. LEFT LOWER EXTREMITY Common Femoral Vein: No evidence of thrombus. Normal compressibility, respiratory phasicity and response to  augmentation. Saphenofemoral Junction: No evidence of thrombus. Normal compressibility and flow on color Doppler imaging. Profunda Femoral Vein:  No evidence of thrombus. Normal compressibility and flow on color Doppler imaging. Femoral Vein: No evidence of thrombus. Normal compressibility, respiratory phasicity and response to augmentation. Popliteal Vein: No evidence of thrombus. Normal compressibility, respiratory phasicity and response to augmentation. Calf Veins: No evidence of thrombus. Normal compressibility and flow on color Doppler imaging. Superficial Great Saphenous Vein: No evidence of thrombus. Normal compressibility. Venous Reflux:  None. Other Findings:  None. IMPRESSION: No evidence of deep venous thrombosis. Electronically Signed   By: Jacqulynn Cadet M.D.   On: 12/16/2018 09:42     ASSESSMENT AND PLAN:   78 year old male with past medical history of myasthenia gravis, hypertension, hyperlipidemia, GERD, diabetes, nonalcoholic liver cirrhosis who presented to the hospital due to abdominal distention, worsening lower extremity edema noted to be in acute kidney injury.  1.  Acute renal failure- multifactorial in nature related to suspected urinary retention status post relative hypotension and blood loss from rectal bleeding. - Due to his significant volume overload patient received some IV Lasix and creatinine improved.  Discussed with nephrology and no plans on starting diuretics for now.  Follow BUN/creatinine urine output.  Renal dose meds, avoid nephrotoxins.  Will transfuse if needed.  2.  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  Patient has tense ascites with worsening lower extremity edema. - Ultrasound-guided paracentesis has been ordered.  Discussed with IR and plan to possibly get it done tomorrow Monday.  3.  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 -Continue Protonix, follow serial hemoglobin.  Plan for  ultrasound-guided paracentesis on Monday.  Patient likely will need to be on some diuretics prior to discharge.  4.  History of myasthenia gravis-continue Pyridostigmine  5.  History of recent prostate abscess with bacteremia- continue oral cefdinir.  6.  Diabetes type 2 without complication-continue sliding scale insulin.  7.  BPH-continue Flomax.  8.  Hyperlipidemia-continue simvastatin.    All the records are reviewed and case discussed with Care Management/Social Worker. Management plans discussed with the patient, family and they are in agreement.  CODE STATUS: DNR  DVT Prophylaxis: Ted's & SCD's  TOTAL TIME TAKING CARE OF THIS PATIENT: 30 minutes.   POSSIBLE D/C IN 2-3 DAYS, DEPENDING ON CLINICAL CONDITION.   Henreitta Leber M.D on 12/16/2018 at 1:45 PM  Between 7am to 6pm - Pager - [phone]  After 6pm go to [email or web address] - Proofreader  [facility]  Office  [phone]  CC: Primary care physician; [name], MD

## 2018-12-16 NOTE — Progress Notes (Signed)
Mason Antigua, MD [address], [place], [place], [place], [address] [address], [place], [place], [place], [address] Phone: [phone]  Fax: [phone]   Subjective:  Patient continues to report abdominal distention.  No fever or chills.  No abdominal pain.  Objective: Exam: Vital signs in last 24 hours: Vitals:   12/16/18 0833 12/16/18 1714 12/16/18 1717 12/16/18 1719  BP: 117/60 134/68 (!) 154/70 139/65  Pulse: 96 100 (!) 121 (!) 136  Resp: 18 18    Temp: 98.5 F (36.9 C)     TempSrc: Oral     SpO2: 98% 98% 97% 98%  Weight:      Height:       Weight change:   Intake/Output Summary (Last 24 hours) at 12/16/2018 1838 Last data filed at 12/16/2018 1735 Gross per 24 hour  Intake 439.08 ml  Output 1527 ml  Net -1087.92 ml    General: No acute distress, AAO x3 Abd: Soft, distended, nontender no HSM Skin: Warm, no rashes Neck: Supple, Trachea midline   Lab Results: Lab Results  Component Value Date   WBC 2.9 (L) 12/16/2018   HGB 7.1 (L) 12/16/2018   HCT 23.5 (L) 12/16/2018   MCV 100.4 (H) 12/16/2018   PLT 80 (L) 12/16/2018   Micro Results: Recent Results (from the past 240 hour(s))  Urine culture     Status: None   Collection Time: 12/15/18  2:48 PM  Result Value Ref Range Status   Specimen Description URINE, RANDOM  Final   Special Requests   Final    NONE Performed at [facility], [address] [place], [place] [address]    Culture NO GROWTH  Final   Report Status 12/16/2018 FINAL  Final  C difficile quick scan w PCR reflex     Status: None   Collection Time: 12/15/18  6:10 PM  Result Value Ref Range Status   C Diff antigen NEGATIVE NEGATIVE Final   C Diff toxin NEGATIVE NEGATIVE Final   C Diff interpretation No C. difficile detected.  Final    Comment: Performed at [facility], [address] [place], [place] [address]   Studies/Results: Ct Abdomen Pelvis Wo Contrast  Result Date: 12/15/2018 CLINICAL DATA:   History of recent TURP. EXAM: CT ABDOMEN AND PELVIS WITHOUT CONTRAST TECHNIQUE: Multidetector CT imaging of the abdomen and pelvis was performed following the standard protocol without IV contrast. COMPARISON:  CT scan 11/26/2018 FINDINGS: Lower chest: The heart is upper limits of normal in size and stable. Stable three-vessel coronary artery calcifications. There is a small left pleural effusion with overlying atelectasis. Hepatobiliary: Stable advanced cirrhotic changes involving the liver but no obvious hepatic mass. The gallbladder is grossly normal. No common bile duct dilatation. Pancreas: No mass, inflammation or ductal dilatation. Moderate atrophy. Spleen: Stable splenomegaly. Adrenals/Urinary Tract: The adrenal glands and kidneys are unremarkable and stable. The bladder contains a Foley catheter. 200 cc of contrast was put in the bladder. No asymmetric bladder wall thickening or bladder mass is identified. The bladder wall is intact. There is extravasation of contrast into the prostate gland and 2 areas 1 in the left mid gland and 1 in the lower right gland. These are areas of previous cystic appearing cavities are abscesses on prior CT scan. Stomach/Bowel: The stomach, duodenum, small bowel and colon are grossly normal. No acute inflammatory changes, mass lesions or obstructive findings. Vascular/Lymphatic: Stable aortic calcifications. Stable scattered mesenteric and retroperitoneal lymph nodes. Reproductive: Expected surgical changes involving  the prostate gland from the recent TURP. Extravasation of contrast into the prostate gland as stated above. Other: Large volume abdominal/pelvic ascites and diffuse body wall edema. Musculoskeletal: No significant bony findings. IMPRESSION: 1. The bladder is intact.  No bladder rupture or leakage. 2. Two areas of extravasation of contrast into the prostate gland. I assume this is normal after a TURP. 3. Advanced cirrhotic changes involving the liver with associated  splenomegaly and large volume abdominal/pelvic ascites. Electronically Signed   By: Marijo Sanes M.D.   On: 12/15/2018 16:20   US Renal  Result Date: 12/15/2018 CLINICAL DATA:  78 y/o  M; hydronephrosis. EXAM: RENAL / URINARY TRACT ULTRASOUND COMPLETE COMPARISON:  11/26/2018 CT abdomen and pelvis. FINDINGS: Right Kidney: Renal measurements: 13.0 x 6.7 x 5.7 cm = volume: 259 mL . Echogenicity within normal limits. No mass or hydronephrosis visualized. Cortical atrophy. Lower pole simple cyst measuring up to 2.8 cm. Left Kidney: Renal measurements: 12.1 x 6.9 x 5.2 cm = volume: 225 mL. Echogenicity within normal limits. No mass or hydronephrosis visualized. Cortical atrophy. Interpolar echogenic shadowing stone measuring up to 9 mm. Bladder: Not visualized.  Ascites. IMPRESSION: 1. Hydronephrosis. Mild cortical atrophy of bilateral kidneys. Left kidney interpolar 9 mm stone. 2. Ascites. Electronically Signed   By: Kristine Garbe M.D.   On: 12/15/2018 13:59   US Venous Img Lower Bilateral  Result Date: 12/16/2018 CLINICAL DATA:  78 year old male with bilateral lower extremity edema EXAM: BILATERAL LOWER EXTREMITY VENOUS DOPPLER ULTRASOUND TECHNIQUE: Gray-scale sonography with graded compression, as well as color Doppler and duplex ultrasound were performed to evaluate the lower extremity deep venous systems from the level of the common femoral vein and including the common femoral, femoral, profunda femoral, popliteal and calf veins including the posterior tibial, peroneal and gastrocnemius veins when visible. The superficial great saphenous vein was also interrogated. Spectral Doppler was utilized to evaluate flow at rest and with distal augmentation maneuvers in the common femoral, femoral and popliteal veins. COMPARISON:  None. FINDINGS: RIGHT LOWER EXTREMITY Common Femoral Vein: No evidence of thrombus. Normal compressibility, respiratory phasicity and response to augmentation. Saphenofemoral  Junction: No evidence of thrombus. Normal compressibility and flow on color Doppler imaging. Profunda Femoral Vein: No evidence of thrombus. Normal compressibility and flow on color Doppler imaging. Femoral Vein: No evidence of thrombus. Normal compressibility, respiratory phasicity and response to augmentation. Popliteal Vein: No evidence of thrombus. Normal compressibility, respiratory phasicity and response to augmentation. Calf Veins: No evidence of thrombus. Normal compressibility and flow on color Doppler imaging. Superficial Great Saphenous Vein: No evidence of thrombus. Normal compressibility. Venous Reflux:  None. Other Findings:  None. LEFT LOWER EXTREMITY Common Femoral Vein: No evidence of thrombus. Normal compressibility, respiratory phasicity and response to augmentation. Saphenofemoral Junction: No evidence of thrombus. Normal compressibility and flow on color Doppler imaging. Profunda Femoral Vein: No evidence of thrombus. Normal compressibility and flow on color Doppler imaging. Femoral Vein: No evidence of thrombus. Normal compressibility, respiratory phasicity and response to augmentation. Popliteal Vein: No evidence of thrombus. Normal compressibility, respiratory phasicity and response to augmentation. Calf Veins: No evidence of thrombus. Normal compressibility and flow on color Doppler imaging. Superficial Great Saphenous Vein: No evidence of thrombus. Normal compressibility. Venous Reflux:  None. Other Findings:  None. IMPRESSION: No evidence of deep venous thrombosis. Electronically Signed   By: Jacqulynn Cadet M.D.   On: 12/16/2018 09:42   Medications:  Scheduled Meds: . cefdinir  300 mg Oral Q12H  . insulin aspart  0-5  Units Subcutaneous QHS  . insulin aspart  0-9 Units Subcutaneous TID WC  . multivitamin with minerals  1 tablet Oral Daily  . pantoprazole  40 mg Oral Daily  . pyridostigmine  60 mg Oral QID  . simvastatin  40 mg Oral q1800  . tamsulosin  0.4 mg Oral Daily    Continuous Infusions: PRN Meds:.docusate sodium, ipratropium-albuterol, loperamide, traMADol   Assessment: Active Problems:   Acute renal failure (ARF) (HCC)   Ascites   Acute lower UTI    Plan: I talked to interventional radiologist over the phone today about the need for paracentesis for this patient given significant abdominal distention  Interventional radiologist states that patient is on the list for tomorrow and the plan is to perform his paracentesis as long as other emergent cases do not overtake to schedule  He looked at his CT scan from yesterday and states that he does not have significant amount of ascites but they would try to do the paracentesis tomorrow  I will order diagnostic labs to be done at the time of the paracentesis as well.  However, patient already on antibiotic coverage for prostate infection  Would recommend albumin infusion 50 g of over 5 L removed  Likely source of decompensation is his recent prostatitis and abscess  Patient had reported some bright red blood seen at home prior to presentation and none since being here.    He has had a recent upper and lower endoscopy which had shown grade 1 esophageal varices and mild portal hypertensive gastropathy and rectal varices were also seen.  Given his recent prostatitis and abscess (not requiring Foley) question if the bright red blood had to do with his recent prostatitis more than a rectal bleed given that he has not had any further episodes since admission.  In addition his UA showed over 50 RBCs, and therefore the source of bleeding was likely his urine given his ongoing prostate issues and not his stool (especially since since Foley placement, no blood per rectum has been seen with his last bowel movement which was brown in color).  In addition, an endoscopy in the setting of possible prostatitis and prostate issues, will have very high risks than benefits.  However, if he has signs of active GI  bleeding, may need to consider RBC scan versus possible flex sig.  No evidence of upper GI bleeding  Continue serial CBCs and transfuse PRN  Over 50% of the time spent in coordinating patient care, discussing with interventional radiologist, patient and staff    LOS: 1 day   Mason Antigua, MD 12/16/2018, 6:38 PM

## 2018-12-16 NOTE — Progress Notes (Signed)
Notify Dr. Verdell Carmine about patient's complaints of diarrhea, order for Imodium given. Checked BM yesterday was negative for cdiff. RN will continue to monitor.

## 2018-12-17 ENCOUNTER — Encounter
Admission: EM | Disposition: A | Discharge status: Home / Self Care | Payer: Self-pay | Source: Home / Self Care | Attending: Specialist

## 2018-12-17 ENCOUNTER — Inpatient Hospital Stay: Payer: Medicare Other | Admitting: Anesthesiology

## 2018-12-17 ENCOUNTER — Inpatient Hospital Stay: Payer: Medicare Other

## 2018-12-17 DIAGNOSIS — R338 Other retention of urine: Secondary | ICD-10-CM

## 2018-12-17 DIAGNOSIS — N320 Bladder-neck obstruction: Secondary | ICD-10-CM

## 2018-12-17 DIAGNOSIS — R310 Gross hematuria: Secondary | ICD-10-CM

## 2018-12-17 DIAGNOSIS — N401 Enlarged prostate with lower urinary tract symptoms: Secondary | ICD-10-CM

## 2018-12-17 HISTORY — PX: TRANSURETHRAL RESECTION OF PROSTATE: SHX73

## 2018-12-17 LAB — BODY FLUID CELL COUNT WITH DIFFERENTIAL
EOS FL: 1 %
Lymphs, Fluid: 57 %
Monocyte-Macrophage-Serous Fluid: 23 %
Neutrophil Count, Fluid: 19 %
OTHER CELLS FL: 0 %
Total Nucleated Cell Count, Fluid: 747 cu mm

## 2018-12-17 LAB — COMPREHENSIVE METABOLIC PANEL
ALT: 17 U/L (ref 0–44)
AST: 24 U/L (ref 15–41)
Albumin: 2.7 g/dL — ABNORMAL LOW (ref 3.5–5.0)
Alkaline Phosphatase: 99 U/L (ref 38–126)
Anion gap: 4 — ABNORMAL LOW (ref 5–15)
BUN: 38 mg/dL — ABNORMAL HIGH (ref 8–23)
CO2: 27 mmol/L (ref 22–32)
Calcium: 8.5 mg/dL — ABNORMAL LOW (ref 8.9–10.3)
Chloride: 113 mmol/L — ABNORMAL HIGH (ref 98–111)
Creatinine, Ser: 1.52 mg/dL — ABNORMAL HIGH (ref 0.61–1.24)
GFR calc non Af Amer: 44 mL/min — ABNORMAL LOW (ref >60)
GFR, EST AFRICAN AMERICAN: 51 mL/min — AB (ref >60)
Glucose, Bld: 166 mg/dL — ABNORMAL HIGH (ref 70–99)
Potassium: 4.8 mmol/L (ref 3.5–5.1)
Sodium: 144 mmol/L (ref 135–145)
Total Bilirubin: 1.4 mg/dL — ABNORMAL HIGH (ref 0.3–1.2)
Total Protein: 5.6 g/dL — ABNORMAL LOW (ref 6.5–8.1)

## 2018-12-17 LAB — ECHOCARDIOGRAM COMPLETE
Height: 69 in
Weight: 4030.4 oz

## 2018-12-17 LAB — GLUCOSE, PLEURAL OR PERITONEAL FLUID: Glucose, Fluid: 200 mg/dL

## 2018-12-17 LAB — CBC
HCT: 24.4 % — ABNORMAL LOW (ref 39.0–52.0)
Hemoglobin: 7.3 g/dL — ABNORMAL LOW (ref 13.0–17.0)
MCH: 30.4 pg (ref 26.0–34.0)
MCHC: 29.9 g/dL — ABNORMAL LOW (ref 30.0–36.0)
MCV: 101.7 fL — AB (ref 80.0–100.0)
Platelets: 79 10*3/uL — ABNORMAL LOW (ref 150–400)
RBC: 2.4 MIL/uL — ABNORMAL LOW (ref 4.22–5.81)
RDW: 17.5 % — ABNORMAL HIGH (ref 11.5–15.5)
WBC: 2.9 10*3/uL — ABNORMAL LOW (ref 4.0–10.5)
nRBC: 0 % (ref 0.0–0.2)

## 2018-12-17 LAB — PROTEIN, PLEURAL OR PERITONEAL FLUID: Total protein, fluid: <3 g/dL

## 2018-12-17 LAB — OCCULT BLOOD X 1 CARD TO LAB, STOOL: Fecal Occult Bld: NEGATIVE

## 2018-12-17 LAB — GLUCOSE, CAPILLARY
Glucose-Capillary: 155 mg/dL — ABNORMAL HIGH (ref 70–99)
Glucose-Capillary: 163 mg/dL — ABNORMAL HIGH (ref 70–99)
Glucose-Capillary: 144 mg/dL — ABNORMAL HIGH (ref 70–99)
Glucose-Capillary: 141 mg/dL — ABNORMAL HIGH (ref 70–99)

## 2018-12-17 LAB — HEMOGLOBIN AND HEMATOCRIT, BLOOD
HCT: 27.2 % — ABNORMAL LOW (ref 39.0–52.0)
HEMOGLOBIN: 8.4 g/dL — AB (ref 13.0–17.0)

## 2018-12-17 LAB — ALBUMIN, PLEURAL OR PERITONEAL FLUID: Albumin, Fluid: <1 g/dL

## 2018-12-17 LAB — PREPARE RBC (CROSSMATCH)

## 2018-12-17 SURGERY — TURP (TRANSURETHRAL RESECTION OF PROSTATE)
Anesthesia: General

## 2018-12-17 MED ORDER — FENTANYL CITRATE (PF) 100 MCG/2ML IJ SOLN
INTRAMUSCULAR | Status: AC
  Filled 2018-12-17: qty 2

## 2018-12-17 MED ORDER — PHENAZOPYRIDINE HCL 100 MG PO TABS
100.0000 mg | ORAL_TABLET | Freq: Three times a day (TID) | ORAL | Status: DC
  Administered 2018-12-17 – 2018-12-20 (×7): 100 mg via ORAL
  Filled 2018-12-17 (×11): qty 1

## 2018-12-17 MED ORDER — SUCCINYLCHOLINE CHLORIDE 20 MG/ML IJ SOLN
INTRAMUSCULAR | Status: AC
  Filled 2018-12-17: qty 1

## 2018-12-17 MED ORDER — FENTANYL CITRATE (PF) 100 MCG/2ML IJ SOLN
INTRAMUSCULAR | Status: AC
  Administered 2018-12-17: 25 ug via INTRAVENOUS
  Filled 2018-12-17: qty 2

## 2018-12-17 MED ORDER — SODIUM CHLORIDE 0.9 % IV SOLN
INTRAVENOUS | Status: DC | PRN
  Administered 2018-12-17: 16:00:00 via INTRAVENOUS

## 2018-12-17 MED ORDER — FENTANYL CITRATE (PF) 100 MCG/2ML IJ SOLN
25.0000 ug | INTRAMUSCULAR | Status: DC | PRN
  Administered 2018-12-17 (×4): 25 ug via INTRAVENOUS

## 2018-12-17 MED ORDER — BELLADONNA ALKALOIDS-OPIUM 16.2-60 MG RE SUPP
1.0000 | Freq: Three times a day (TID) | RECTAL | Status: DC | PRN
  Filled 2018-12-17: qty 1

## 2018-12-17 MED ORDER — PROPOFOL 10 MG/ML IV BOLUS
INTRAVENOUS | Status: AC
  Filled 2018-12-17: qty 20

## 2018-12-17 MED ORDER — SUCCINYLCHOLINE CHLORIDE 20 MG/ML IJ SOLN
INTRAMUSCULAR | Status: DC | PRN
  Administered 2018-12-17: 120 mg via INTRAVENOUS

## 2018-12-17 MED ORDER — LIDOCAINE HCL (PF) 2 % IJ SOLN
INTRAMUSCULAR | Status: AC
  Filled 2018-12-17: qty 10

## 2018-12-17 MED ORDER — FENTANYL CITRATE (PF) 100 MCG/2ML IJ SOLN
INTRAMUSCULAR | Status: DC | PRN
  Administered 2018-12-17 (×6): 25 ug via INTRAVENOUS
  Administered 2018-12-17: 50 ug via INTRAVENOUS

## 2018-12-17 MED ORDER — PROPOFOL 10 MG/ML IV BOLUS
INTRAVENOUS | Status: DC | PRN
  Administered 2018-12-17: 20 mg via INTRAVENOUS
  Administered 2018-12-17: 150 mg via INTRAVENOUS
  Administered 2018-12-17: 20 mg via INTRAVENOUS
  Administered 2018-12-17: 30 mg via INTRAVENOUS

## 2018-12-17 MED ORDER — SODIUM CHLORIDE 0.9 % IV SOLN
INTRAVENOUS | Status: DC | PRN
  Administered 2018-12-17 (×2): via INTRAVENOUS

## 2018-12-17 MED ORDER — PHENYLEPHRINE HCL 10 MG/ML IJ SOLN
INTRAMUSCULAR | Status: DC | PRN
  Administered 2018-12-17 (×2): 100 ug via INTRAVENOUS

## 2018-12-17 MED ORDER — HYDROCODONE-ACETAMINOPHEN 5-325 MG PO TABS
1.0000 | ORAL_TABLET | ORAL | Status: DC | PRN
  Administered 2018-12-18 – 2018-12-20 (×5): 1 via ORAL
  Filled 2018-12-17 (×6): qty 1

## 2018-12-17 MED ORDER — ONDANSETRON HCL 4 MG/2ML IJ SOLN: 4.0000 mg | Freq: Once | INTRAMUSCULAR | Status: DC | PRN

## 2018-12-17 MED ORDER — SODIUM CHLORIDE FLUSH 0.9 % IV SOLN
INTRAVENOUS | Status: AC
  Administered 2018-12-17: 3 mL via INTRAVENOUS
  Filled 2018-12-17: qty 10

## 2018-12-17 MED ORDER — LIDOCAINE HCL (CARDIAC) PF 100 MG/5ML IV SOSY
PREFILLED_SYRINGE | INTRAVENOUS | Status: DC | PRN
  Administered 2018-12-17: 40 mg via INTRAVENOUS

## 2018-12-17 MED ORDER — SODIUM CHLORIDE 0.9% IV SOLUTION
Freq: Once | INTRAVENOUS | Status: AC
  Administered 2018-12-17: 15:00:00 via INTRAVENOUS

## 2018-12-17 SURGICAL SUPPLY — 19 items
BAG DRAIN CYSTO-URO LG1000N (MISCELLANEOUS) ×3 IMPLANT
BAG URO DRAIN 4000ML (MISCELLANEOUS) ×3 IMPLANT
BRUSH SCRUB EZ  4% CHG (MISCELLANEOUS) ×2
BRUSH SCRUB EZ 4% CHG (MISCELLANEOUS) ×1 IMPLANT
CATH FOLEY 3WAY 30CC 24FR (CATHETERS) ×2
CATH URETL 5X70 OPEN END (CATHETERS) ×3 IMPLANT
CATH URTH STD 24FR FL 3W 2 (CATHETERS) ×1 IMPLANT
CONRAY 43 FOR UROLOGY 50M (MISCELLANEOUS) IMPLANT
DRAPE UTILITY 15X26 TOWEL STRL (DRAPES) ×3 IMPLANT
GOWN STRL REUS W/ TWL LRG LVL3 (GOWN DISPOSABLE) ×1 IMPLANT
GOWN STRL REUS W/TWL LRG LVL3 (GOWN DISPOSABLE) ×2
GUIDEWIRE STR DUAL SENSOR (WIRE) ×3 IMPLANT
KIT TURNOVER CYSTO (KITS) ×3 IMPLANT
PACK CYSTO AR (MISCELLANEOUS) ×3 IMPLANT
SET CYSTO W/LG BORE CLAMP LF (SET/KITS/TRAYS/PACK) ×3 IMPLANT
SOL .9 NS 3000ML IRR  AL (IV SOLUTION) ×18
SOL .9 NS 3000ML IRR UROMATIC (IV SOLUTION) ×9 IMPLANT
SURGILUBE 2OZ TUBE FLIPTOP (MISCELLANEOUS) ×3 IMPLANT
WATER STERILE IRR 1000ML POUR (IV SOLUTION) ×3 IMPLANT

## 2018-12-17 NOTE — Anesthesia Post-op Follow-up Note (Signed)
Anesthesia QCDR form completed.        

## 2018-12-17 NOTE — Progress Notes (Signed)
Foley cath "flushed" as ordered with 83ml of sterile water. A few small clots and some debris on pull back. Approximately 100 ml of amber urine on return. Dr. Verdell Carmine made aware.

## 2018-12-17 NOTE — Progress Notes (Signed)
Patient has home CPAP unit at bedside. RT reviewed unit and found to be in good working condition. Plugged into red outlet. Patient states he does not wear Oxygen with his unit. Patient resting comfortably in bed at this time.

## 2018-12-17 NOTE — Anesthesia Postprocedure Evaluation (Signed)
Anesthesia Post Note  Patient: Mason Wilson.  Procedure(s) Performed: TRANSURETHRAL RESECTION OF THE PROSTATE (TURP),cystoscopy,clot evacuation (N/A )  Patient location during evaluation: PACU Anesthesia Type: General Level of consciousness: awake and alert Pain management: pain level controlled Vital Signs Assessment: post-procedure vital signs reviewed and stable Respiratory status: spontaneous breathing, nonlabored ventilation, respiratory function stable and patient connected to nasal cannula oxygen Cardiovascular status: blood pressure returned to baseline and stable Postop Assessment: no apparent nausea or vomiting Anesthetic complications: no     Last Vitals:  Vitals:   12/17/18 1827 12/17/18 1842  BP: 120/66 123/67  Pulse: 91 100  Resp: 20 20  Temp: (!) 36.3 C   SpO2: 98% 100%    Last Pain:  Vitals:   12/17/18 1842  TempSrc:   PainSc: 4                  Molli Barrows

## 2018-12-17 NOTE — Consult Note (Addendum)
H&P Physician requesting consult: Abel Presto, MD  Chief Complaint: Urinary retention  History of Present Illness: 78 year old male recently underwent a transurethral resection of prostatic abscess by Dr. Jeffie Pollock on 11/26/2018.  He recovered from this and his catheter was removed.  He presented to the urology clinic on Friday with urinary retention.  A Foley catheter was placed but there was minimal return of urine.  He therefore was sent to the emergency department where a CT cystogram was performed.  It showed the catheter was in good position.  There was no obvious rupture of the bladder.  The patient has been in a significant amount of discomfort.  He has some large volume ascites.  This is caused some mild penile and scrotal edema.  He has also had his catheter exchanged 3 or 4 times.  Each time, it causes him significant pain and discomfort.  Currently, his Foley catheter is not draining and it feels like it is not in proper position.  He does not feel like he can undergo another catheter exchange physically and mentally.  There is just too much pain which each removal and attempted replacement.  In addition to this, he has been noted to have some clots.  Patient hemoglobin is 7.3.  He has been having intermittent rectal bleeding.  Past Medical History:  Diagnosis Date  . Allergy   . Arthritis    "everywhere' - worse in back  . Body mass index 39.0-39.9, adult   . CAD (coronary artery disease)   . Chronic back pain   . Cirrhosis, non-alcoholic ([place])   . Diabetes mellitus without complication ([place])   . Elevated liver enzymes   . GERD (gastroesophageal reflux disease)   . Heart murmur   . Hyperlipidemia   . Hypertension   . Hypogonadism in male   . Melanoma ([place])    melanoma   . Melanoma in situ of right shoulder ([place])   . Myasthenia gravis ([place]) 01/2018  . Renal stones   . Sleep apnea    CPAP  . Uses hearing aid    doesn't wear   Past Surgical History:  Procedure Laterality Date   . ANGIOPLASTY  1999   3 stents  . CARDIAC CATHETERIZATION     stents placed  . CARPAL TUNNEL RELEASE     x2  . CATARACT EXTRACTION, BILATERAL    . COLONOSCOPY WITH PROPOFOL N/A 07/03/2018   Procedure: COLONOSCOPY WITH PROPOFOL;  Surgeon: Lucilla Lame, MD;  Location: [facility];  Service: Endoscopy;  Laterality: N/A;  . ESOPHAGOGASTRODUODENOSCOPY (EGD) WITH PROPOFOL N/A 09/08/2015   Procedure: ESOPHAGOGASTRODUODENOSCOPY (EGD) WITH PROPOFOL;  Surgeon: Lucilla Lame, MD;  Location: [facility];  Service: Endoscopy;  Laterality: N/A;  CPAP Diabetic - oral meds  . ESOPHAGOGASTRODUODENOSCOPY (EGD) WITH PROPOFOL N/A 07/03/2018   Procedure: ESOPHAGOGASTRODUODENOSCOPY (EGD) WITH PROPOFOL;  Surgeon: Lucilla Lame, MD;  Location: [facility];  Service: Endoscopy;  Laterality: N/A;  diabetic - oral meds sleep apnea  . LIVER BIOPSY    . POLYPECTOMY N/A 07/03/2018   Procedure: POLYPECTOMY;  Surgeon: Lucilla Lame, MD;  Location: [facility];  Service: Endoscopy;  Laterality: N/A;  . SKIN LESION EXCISION  1998  . SKIN LESION EXCISION     skin cancer removed on forehead in June 2018  . TONSILLECTOMY     age 74  . TRANSURETHRAL RESECTION OF PROSTATE N/A 11/26/2018   Procedure: TRANSURETHRAL RESECTION OF THE PROSTATE (TURP);  Surgeon: Irine Seal, MD;  Location: [facility];  Service: Urology;  Laterality: N/A;    Home Medications:  Medications Prior to Admission  Medication Sig Dispense Refill Last Dose  . Calcium Carbonate-Vit D-Min (CALCIUM 1200 PO) Take 1,200 mg by mouth.   12/15/2018 at 0800  . cefUROXime (CEFTIN) 250 MG tablet Take 1 tablet (250 mg total) by mouth 2 (two) times daily. 90 tablet 0 12/15/2018 at 0800  . glipiZIDE (GLUCOTROL) 5 MG tablet Take 1 tablet (5 mg total) by mouth daily before breakfast. 90 tablet 4 12/15/2018 at 0800  . losartan (COZAAR) 100 MG tablet Take 1 tablet (100 mg total) by mouth daily. 90 tablet 4 12/15/2018 at 0800  . metFORMIN (GLUCOPHAGE) 500  MG tablet Take 2 tablets (1,000 mg total) by mouth 2 (two) times daily with a meal. 360 tablet 4 12/15/2018 at 0800  . metoprolol succinate (TOPROL-XL) 50 MG 24 hr tablet Take 1 tablet (50 mg total) by mouth daily. Take with or immediately following a meal. 90 tablet 4 12/15/2018 at 0800  . Multiple Vitamin (MULTIVITAMIN WITH MINERALS) TABS tablet Take 1 tablet by mouth daily. 30 tablet 0 12/15/2018 at 0800  . omeprazole (PRILOSEC) 40 MG capsule Take 40 mg by mouth daily.    12/15/2018 at 0800  . pyridostigmine (MESTINON) 60 MG tablet Take 60 mg by mouth 4 (four) times daily.   12/15/2018 at 0800  . simvastatin (ZOCOR) 40 MG tablet Take 1 tablet (40 mg total) by mouth daily at 6 PM. 90 tablet 4 12/14/2018 at 1600  . tamsulosin (FLOMAX) 0.4 MG CAPS capsule TAKE 1 CAPSULE(0.4 MG) BY MOUTH DAILY 90 capsule 3 12/14/2018 at 1600  . acetaminophen (TYLENOL) 325 MG tablet Place 2 tablets (650 mg total) into feeding tube every 6 (six) hours as needed for mild pain (or Fever >/= 101). 10 tablet 0 prn at prn   Allergies:  Allergies  Allergen Reactions  . Altace [Ramipril] Cough    Family History  Problem Relation Age of Onset  . Thyroid disease Mother   . Alzheimer's disease Mother   . Heart disease Mother   . Cancer Father 24       testicular  . Heart disease Father   . Hypertension Father   . Heart attack Father   . Kidney disease Neg Hx   . Prostate cancer Neg Hx   . Kidney cancer Neg Hx   . Bladder Cancer Neg Hx    Social History:  reports that he quit smoking about 48 years ago. His smoking use included cigarettes. His smokeless tobacco use includes chew. He reports that he does not drink alcohol or use drugs.  ROS: A complete review of systems was performed.  All systems are negative except for pertinent findings as noted. ROS   Physical Exam:  Vital signs in last 24 hours: Temp:  [98.1 F (36.7 C)-99.2 F (37.3 C)] 98.1 F (36.7 C) (02/09 0801) Pulse Rate:  [95-136] 108 (02/09 0801) Resp:   [16-20] 18 (02/09 0801) BP: (134-154)/(65-74) 149/74 (02/09 0801) SpO2:  [96 %-98 %] 96 % (02/09 0801) General:  Alert and oriented, No acute distress HEENT: Normocephalic, atraumatic Neck: No JVD or lymphadenopathy Cardiovascular: Regular rate and rhythm Lungs: Regular rate and effort Abdomen: Obese soft, nontender, nondistended, no abdominal masses Back: No CVA tenderness Genitourinary: 14 French Foley catheter in place.  It does not move with repositioning.  Catheter not draining.  He has an uncircumcised phallus.  He has moderate penile and scrotal edema without evidence of infection Neurologic: Grossly  intact  Laboratory Data:  Results for orders placed or performed during the hospital encounter of 12/15/18 (from the past 24 hour(s))  Glucose, capillary     Status: Abnormal   Collection Time: 12/16/18  5:11 PM  Result Value Ref Range   Glucose-Capillary 163 (H) 70 - 99 mg/dL  Glucose, capillary     Status: Abnormal   Collection Time: 12/16/18  9:41 PM  Result Value Ref Range   Glucose-Capillary 178 (H) 70 - 99 mg/dL  CBC     Status: Abnormal   Collection Time: 12/17/18  3:17 AM  Result Value Ref Range   WBC 2.9 (L) 4.0 - 10.5 K/uL   RBC 2.40 (L) 4.22 - 5.81 MIL/uL   Hemoglobin 7.3 (L) 13.0 - 17.0 g/dL   HCT 24.4 (L) 39.0 - 52.0 %   MCV 101.7 (H) 80.0 - 100.0 fL   MCH 30.4 26.0 - 34.0 pg   MCHC 29.9 (L) 30.0 - 36.0 g/dL   RDW 17.5 (H) 11.5 - 15.5 %   Platelets 79 (L) 150 - 400 K/uL   nRBC 0.0 0.0 - 0.2 %  Comprehensive metabolic panel     Status: Abnormal   Collection Time: 12/17/18  3:17 AM  Result Value Ref Range   Sodium 144 135 - 145 mmol/L   Potassium 4.8 3.5 - 5.1 mmol/L   Chloride 113 (H) 98 - 111 mmol/L   CO2 27 22 - 32 mmol/L   Glucose, Bld 166 (H) 70 - 99 mg/dL   BUN 38 (H) 8 - 23 mg/dL   Creatinine, Ser 1.52 (H) 0.61 - 1.24 mg/dL   Calcium 8.5 (L) 8.9 - 10.3 mg/dL   Total Protein 5.6 (L) 6.5 - 8.1 g/dL   Albumin 2.7 (L) 3.5 - 5.0 g/dL   AST 24 15 - 41  U/L   ALT 17 0 - 44 U/L   Alkaline Phosphatase 99 38 - 126 U/L   Total Bilirubin 1.4 (H) 0.3 - 1.2 mg/dL   GFR calc non Af Amer 44 (L) >60 mL/min   GFR calc Af Amer 51 (L) >60 mL/min   Anion gap 4 (L) 5 - 15  Glucose, capillary     Status: Abnormal   Collection Time: 12/17/18  7:59 AM  Result Value Ref Range   Glucose-Capillary 155 (H) 70 - 99 mg/dL  Glucose, capillary     Status: Abnormal   Collection Time: 12/17/18 12:05 PM  Result Value Ref Range   Glucose-Capillary 163 (H) 70 - 99 mg/dL  Prepare RBC     Status: None   Collection Time: 12/17/18  1:30 PM  Result Value Ref Range   Order Confirmation      ORDER PROCESSED BY BLOOD BANK Performed at [facility], [address] [place], [place] [address]    Recent Results (from the past 240 hour(s))  Urine culture     Status: None   Collection Time: 12/15/18  2:48 PM  Result Value Ref Range Status   Specimen Description URINE, RANDOM  Final   Special Requests   Final    NONE Performed at [facility], [address] [place], [place] [address]    Culture NO GROWTH  Final   Report Status 12/16/2018 FINAL  Final  C difficile quick scan w PCR reflex     Status: None   Collection Time: 12/15/18  6:10 PM  Result Value Ref Range Status   C Diff antigen NEGATIVE NEGATIVE Final   C  Diff toxin NEGATIVE NEGATIVE Final   C Diff interpretation No C. difficile detected.  Final    Comment: Performed at [facility], [address] [place], [place] [address]   Creatinine: Recent Labs    12/15/18 1202 12/16/18 0405 12/17/18 0317  CREATININE 2.04* 1.86* 1.52*    Impression/Assessment:  Urinary retention History of prostatic abscess Gross hematuria Acute blood loss anemia Liver cirrhosis with rectal bleeding possibly from varices Scrotal and penile edema  Plan:  I recommended removal and replacement of the Foley catheter.  This causes the patient a great deal of pain and discomfort.  Therefore,  plan to perform an examination under anesthesia with cystoscopy, possible transurethral evacuation of clots, possible transurethral resection of prostate.  I will upsize the catheter in the operating room.  I will also consent him for possible suprapubic tube placement.  Plan to transfuse 1 unit PRBC.  Marton Redwood, III 12/17/2018, 1:24 PM

## 2018-12-17 NOTE — Anesthesia Procedure Notes (Signed)
Procedure Name: Intubation Date/Time: 12/17/2018 4:25 PM Performed by: Allean Found, CRNA Pre-anesthesia Checklist: Patient identified, Patient being monitored, Timeout performed, Emergency Drugs available and Suction available Patient Re-evaluated:Patient Re-evaluated prior to induction Oxygen Delivery Method: Circle system utilized Preoxygenation: Pre-oxygenation with 100% oxygen Induction Type: IV induction Ventilation: Mask ventilation without difficulty Laryngoscope Size: McGraph and 4 Grade View: Grade II Tube type: Oral Tube size: 7.5 mm Number of attempts: 1 Airway Equipment and Method: Stylet Placement Confirmation: ETT inserted through vocal cords under direct vision,  positive ETCO2 and breath sounds checked- equal and bilateral Secured at: 21 cm Tube secured with: Tape Dental Injury: Teeth and Oropharynx as per pre-operative assessment

## 2018-12-17 NOTE — Progress Notes (Signed)
[facility]  ROUNDING NOTE   Subjective:  Patient still having some leakage around the Foley catheter. Appreciate urology input and they are planning to take the patient to the OR for cystoscopy. Renal function has improved a bit.   Objective:  Vital signs in last 24 hours:  Temp:  [98.1 F (36.7 C)-99.2 F (37.3 C)] 98.1 F (36.7 C) (02/09 0801) Pulse Rate:  [85-136] 85 (02/09 1327) Resp:  [16-100] 100 (02/09 1357) BP: (133-154)/(62-74) 133/62 (02/09 1357) SpO2:  [95 %-98 %] 95 % (02/09 1357)  Weight change:  Filed Weights   12/15/18 1157 12/15/18 2026 12/16/18 0431  Weight: 117 kg 116 kg 114.3 kg    Intake/Output: I/O last 3 completed shifts: In: 439.1 [I.V.:439.1] Out: 1527 [Urine:1525; Stool:2]   Intake/Output this shift:  Total I/O In: -  Out: 100 [Urine:100]  Physical Exam: General: No acute distress  Head: Normocephalic, atraumatic. Moist oral mucosal membranes  Eyes: Anicteric  Neck: Supple, trachea midline  Lungs:  Clear to auscultation, normal effort  Heart: S1S2 no rubs  Abdomen:  Distension noted  Extremities: 2+ peripheral edema.  Neurologic: Awake, alert, following commands  Skin: No lesions  GU:  Foley in place    Basic Metabolic Panel: Recent Labs  Lab 12/15/18 1202 12/16/18 0405 12/17/18 0317  NA 143 143 144  K 4.1 4.6 4.8  CL 111 112* 113*  CO2 24 26 27   GLUCOSE 116* 112* 166*  BUN 47* 43* 38*  CREATININE 2.04* 1.86* 1.52*  CALCIUM 8.9 8.4* 8.5*    Liver Function Tests: Recent Labs  Lab 12/15/18 1202 12/17/18 0317  AST 30 24  ALT 21 17  ALKPHOS 102 99  BILITOT 1.4* 1.4*  PROT 6.2* 5.6*  ALBUMIN 3.0* 2.7*   No results for input(s): LIPASE, AMYLASE in the last 168 hours. No results for input(s): AMMONIA in the last 168 hours.  CBC: Recent Labs  Lab 12/15/18 1202 12/16/18 0405 12/17/18 0317  WBC 4.9 2.9* 2.9*  HGB 8.9* 7.1* 7.3*  HCT 28.9* 23.5* 24.4*  MCV 100.0 100.4* 101.7*  PLT 115* 80* 79*     Cardiac Enzymes: No results for input(s): CKTOTAL, CKMB, CKMBINDEX, TROPONINI in the last 168 hours.  BNP: Invalid input(s): POCBNP  CBG: Recent Labs  Lab 12/16/18 1206 12/16/18 1711 12/16/18 2141 12/17/18 0759 12/17/18 1205  GLUCAP 180* 163* 178* 155* 163*    Microbiology: Results for orders placed or performed during the hospital encounter of 12/15/18  Urine culture     Status: None   Collection Time: 12/15/18  2:48 PM  Result Value Ref Range Status   Specimen Description URINE, RANDOM  Final   Special Requests   Final    NONE Performed at [facility], [address] [place], [place] [address]    Culture NO GROWTH  Final   Report Status 12/16/2018 FINAL  Final  C difficile quick scan w PCR reflex     Status: None   Collection Time: 12/15/18  6:10 PM  Result Value Ref Range Status   C Diff antigen NEGATIVE NEGATIVE Final   C Diff toxin NEGATIVE NEGATIVE Final   C Diff interpretation No C. difficile detected.  Final    Comment: Performed at [facility], [address] [place], [place] [address]    Coagulation Studies: No results for input(s): LABPROT, INR in the last 72 hours.  Urinalysis: Recent Labs    12/15/18 1448  COLORURINE RED*  LABSPEC 1.024  PHURINE TEST  NOT REPORTED DUE TO COLOR INTERFERENCE OF URINE PIGMENT  GLUCOSEU TEST NOT REPORTED DUE TO COLOR INTERFERENCE OF URINE PIGMENT*  HGBUR TEST NOT REPORTED DUE TO COLOR INTERFERENCE OF URINE PIGMENT*  BILIRUBINUR TEST NOT REPORTED DUE TO COLOR INTERFERENCE OF URINE PIGMENT*  KETONESUR TEST NOT REPORTED DUE TO COLOR INTERFERENCE OF URINE PIGMENT*  PROTEINUR TEST NOT REPORTED DUE TO COLOR INTERFERENCE OF URINE PIGMENT*  NITRITE TEST NOT REPORTED DUE TO COLOR INTERFERENCE OF URINE PIGMENT*  LEUKOCYTESUR TEST NOT REPORTED DUE TO COLOR INTERFERENCE OF URINE PIGMENT*      Imaging: Ct Abdomen Pelvis Wo Contrast  Result Date: 12/15/2018 CLINICAL DATA:  History of recent TURP.  EXAM: CT ABDOMEN AND PELVIS WITHOUT CONTRAST TECHNIQUE: Multidetector CT imaging of the abdomen and pelvis was performed following the standard protocol without IV contrast. COMPARISON:  CT scan 11/26/2018 FINDINGS: Lower chest: The heart is upper limits of normal in size and stable. Stable three-vessel coronary artery calcifications. There is a small left pleural effusion with overlying atelectasis. Hepatobiliary: Stable advanced cirrhotic changes involving the liver but no obvious hepatic mass. The gallbladder is grossly normal. No common bile duct dilatation. Pancreas: No mass, inflammation or ductal dilatation. Moderate atrophy. Spleen: Stable splenomegaly. Adrenals/Urinary Tract: The adrenal glands and kidneys are unremarkable and stable. The bladder contains a Foley catheter. 200 cc of contrast was put in the bladder. No asymmetric bladder wall thickening or bladder mass is identified. The bladder wall is intact. There is extravasation of contrast into the prostate gland and 2 areas 1 in the left mid gland and 1 in the lower right gland. These are areas of previous cystic appearing cavities are abscesses on prior CT scan. Stomach/Bowel: The stomach, duodenum, small bowel and colon are grossly normal. No acute inflammatory changes, mass lesions or obstructive findings. Vascular/Lymphatic: Stable aortic calcifications. Stable scattered mesenteric and retroperitoneal lymph nodes. Reproductive: Expected surgical changes involving the prostate gland from the recent TURP. Extravasation of contrast into the prostate gland as stated above. Other: Large volume abdominal/pelvic ascites and diffuse body wall edema. Musculoskeletal: No significant bony findings. IMPRESSION: 1. The bladder is intact.  No bladder rupture or leakage. 2. Two areas of extravasation of contrast into the prostate gland. I assume this is normal after a TURP. 3. Advanced cirrhotic changes involving the liver with associated splenomegaly and large  volume abdominal/pelvic ascites. Electronically Signed   By: Marijo Sanes M.D.   On: 12/15/2018 16:20   US Venous Img Lower Bilateral  Result Date: 12/16/2018 CLINICAL DATA:  78 year old male with bilateral lower extremity edema EXAM: BILATERAL LOWER EXTREMITY VENOUS DOPPLER ULTRASOUND TECHNIQUE: Gray-scale sonography with graded compression, as well as color Doppler and duplex ultrasound were performed to evaluate the lower extremity deep venous systems from the level of the common femoral vein and including the common femoral, femoral, profunda femoral, popliteal and calf veins including the posterior tibial, peroneal and gastrocnemius veins when visible. The superficial great saphenous vein was also interrogated. Spectral Doppler was utilized to evaluate flow at rest and with distal augmentation maneuvers in the common femoral, femoral and popliteal veins. COMPARISON:  None. FINDINGS: RIGHT LOWER EXTREMITY Common Femoral Vein: No evidence of thrombus. Normal compressibility, respiratory phasicity and response to augmentation. Saphenofemoral Junction: No evidence of thrombus. Normal compressibility and flow on color Doppler imaging. Profunda Femoral Vein: No evidence of thrombus. Normal compressibility and flow on color Doppler imaging. Femoral Vein: No evidence of thrombus. Normal compressibility, respiratory phasicity and response to augmentation. Popliteal Vein: No evidence  of thrombus. Normal compressibility, respiratory phasicity and response to augmentation. Calf Veins: No evidence of thrombus. Normal compressibility and flow on color Doppler imaging. Superficial Great Saphenous Vein: No evidence of thrombus. Normal compressibility. Venous Reflux:  None. Other Findings:  None. LEFT LOWER EXTREMITY Common Femoral Vein: No evidence of thrombus. Normal compressibility, respiratory phasicity and response to augmentation. Saphenofemoral Junction: No evidence of thrombus. Normal compressibility and flow on  color Doppler imaging. Profunda Femoral Vein: No evidence of thrombus. Normal compressibility and flow on color Doppler imaging. Femoral Vein: No evidence of thrombus. Normal compressibility, respiratory phasicity and response to augmentation. Popliteal Vein: No evidence of thrombus. Normal compressibility, respiratory phasicity and response to augmentation. Calf Veins: No evidence of thrombus. Normal compressibility and flow on color Doppler imaging. Superficial Great Saphenous Vein: No evidence of thrombus. Normal compressibility. Venous Reflux:  None. Other Findings:  None. IMPRESSION: No evidence of deep venous thrombosis. Electronically Signed   By: Jacqulynn Cadet M.D.   On: 12/16/2018 09:42     Medications:    . sodium chloride   Intravenous Once  . cefdinir  300 mg Oral Q12H  . insulin aspart  0-5 Units Subcutaneous QHS  . insulin aspart  0-9 Units Subcutaneous TID WC  . multivitamin with minerals  1 tablet Oral Daily  . pantoprazole  40 mg Oral Daily  . phenazopyridine  100 mg Oral TID WC  . pyridostigmine  60 mg Oral QID  . simvastatin  40 mg Oral q1800  . tamsulosin  0.4 mg Oral Daily   docusate sodium, HYDROcodone-acetaminophen, ipratropium-albuterol, loperamide, traMADol  Assessment/ Plan:  78 y.o. male with PMHx of coronary artery disease, myasthenia gravis, chronic back pain, nonalcoholic cirrhosis, diabetes mellitus type 2, GERD, hyperlipidemia, hypertension, history of melanoma, nephrolithiasis who was admitted to [facility] on 12/15/2018 for evaluation of rectal bleeding and acute renal failure.  1.  Acute renal failure, likely multifactorial with the possibility of urinary retention, relative hypotension, and blood loss from GI bleed. 2.  Anemia specified/GI bleed. 3.  Cirrhosis of the liver. 4.  Generalized edema.  Plan: Urinary retention was playing some role in the patient's acute renal failure.  His baseline creatinine is 0.9.  Creatinine now down to 1.52.  Hopefully  with continued Foley catheter drainage his renal function will continue to improve.  Patient also to be administered PRBC transfusion today.  Hold diuretics for now if possible.  Patient also underwent paracentesis today.   LOS: 2 Avalene Sealy 2/9/20202:10 PM

## 2018-12-17 NOTE — Progress Notes (Signed)
PROCEDURE SUMMARY:  Successful US guided paracentesis from right lateral abdomen.  Yielded 4.8 liters of yellow fluid.  No immediate complications.  Pt tolerated well.   Specimen was sent for labs.  EBL < 31mL  Docia Barrier PA-C 12/17/2018 1:52 PM

## 2018-12-17 NOTE — Anesthesia Preprocedure Evaluation (Addendum)
Anesthesia Evaluation  Patient identified by MRN, date of birth, ID band Patient awake    Reviewed: Allergy & Precautions, H&P , NPO status , Patient's Chart, lab work & pertinent test results, reviewed documented beta blocker date and time   Airway Mallampati: III  TM Distance: >3 FB Neck ROM: full    Dental  (+) Teeth Intact   Pulmonary neg pulmonary ROS, sleep apnea and Continuous Positive Airway Pressure Ventilation , former smoker,    Pulmonary exam normal        Cardiovascular Exercise Tolerance: Poor hypertension, On Medications + CAD and + Peripheral Vascular Disease  negative cardio ROS Normal cardiovascular exam+ Valvular Problems/Murmurs  Rhythm:regular Rate:Normal     Neuro/Psych  Neuromuscular disease negative neurological ROS  negative psych ROS   GI/Hepatic negative GI ROS, GERD  Medicated,(+) Cirrhosis       ,   Endo/Other  diabetesMorbid obesity  Renal/GU Renal diseasenegative Renal ROS  negative genitourinary   Musculoskeletal   Abdominal   Peds  Hematology negative hematology ROS (+) Blood dyscrasia, anemia ,   Anesthesia Other Findings Past Medical History: No date: Allergy No date: Arthritis     Comment:  "everywhere' - worse in back No date: Body mass index 39.0-39.9, adult No date: CAD (coronary artery disease) No date: Chronic back pain No date: Cirrhosis, non-alcoholic (HCC) No date: Diabetes mellitus without complication (HCC) No date: Elevated liver enzymes No date: GERD (gastroesophageal reflux disease) No date: Heart murmur No date: Hyperlipidemia No date: Hypertension No date: Hypogonadism in male No date: Melanoma [facility])     Comment:  melanoma  No date: Melanoma in situ of right shoulder ([place]) 01/2018: Myasthenia gravis ([place]) No date: Renal stones No date: Sleep apnea     Comment:  CPAP No date: Uses hearing aid     Comment:  doesn't wear Past Surgical History: 1999:  ANGIOPLASTY     Comment:  3 stents No date: CARDIAC CATHETERIZATION     Comment:  stents placed No date: CARPAL TUNNEL RELEASE     Comment:  x2 No date: CATARACT EXTRACTION, BILATERAL 07/03/2018: COLONOSCOPY WITH PROPOFOL; N/A     Comment:  Procedure: COLONOSCOPY WITH PROPOFOL;  Surgeon: Lucilla Lame, MD;  Location: [place];  Service:               Endoscopy;  Laterality: N/A; 09/08/2015: ESOPHAGOGASTRODUODENOSCOPY (EGD) WITH PROPOFOL; N/A     Comment:  Procedure: ESOPHAGOGASTRODUODENOSCOPY (EGD) WITH               PROPOFOL;  Surgeon: Lucilla Lame, MD;  Location: [facility];  Service: Endoscopy;  Laterality: N/A;                CPAP Diabetic - oral meds 07/03/2018: ESOPHAGOGASTRODUODENOSCOPY (EGD) WITH PROPOFOL; N/A     Comment:  Procedure: ESOPHAGOGASTRODUODENOSCOPY (EGD) WITH               PROPOFOL;  Surgeon: Lucilla Lame, MD;  Location: [place];  Service: Endoscopy;  Laterality: N/A;                diabetic - oral meds sleep apnea No date: LIVER BIOPSY 07/03/2018: POLYPECTOMY; N/A     Comment:  Procedure: POLYPECTOMY;  Surgeon:  Lucilla Lame, MD;                Location: [place];  Service: Endoscopy;                Laterality: N/A; 1998: SKIN LESION EXCISION No date: SKIN LESION EXCISION     Comment:  skin cancer removed on forehead in June 2018 No date: TONSILLECTOMY     Comment:  age 37 11/26/2018: TRANSURETHRAL RESECTION OF PROSTATE; N/A     Comment:  Procedure: TRANSURETHRAL RESECTION OF THE PROSTATE               (TURP);  Surgeon: Irine Seal, MD;  Location: [facility];                Service: Urology;  Laterality: N/A; BMI    Body Mass Index:  37.20 kg/m     Reproductive/Obstetrics negative OB ROS                            Anesthesia Physical Anesthesia Plan  ASA: IV and emergent  Anesthesia Plan: General LMA   Post-op Pain Management:    Induction:    PONV Risk Score and Plan:   Airway Management Planned:   Additional Equipment:   Intra-op Plan:   Post-operative Plan:   Informed Consent: I have reviewed the patients History and Physical, chart, labs and discussed the procedure including the risks, benefits and alternatives for the proposed anesthesia with the patient or authorized representative who has indicated his/her understanding and acceptance.     Dental Advisory Given  Plan Discussed with: CRNA  Anesthesia Plan Comments: ( Ate at 9am.  Transfusion initiated.  Will proceed with GOT)       Anesthesia Quick Evaluation

## 2018-12-17 NOTE — Transfer of Care (Signed)
Immediate Anesthesia Transfer of Care Note  Patient: Mason Wilson.  Procedure(s) Performed: TRANSURETHRAL RESECTION OF THE PROSTATE (TURP),cystoscopy,clot evacuation (N/A )  Patient Location: PACU  Anesthesia Type:General  Level of Consciousness: awake and alert   Airway & Oxygen Therapy: Patient Spontanous Breathing and Patient connected to face mask oxygen  Post-op Assessment: Report given to RN and Post -op Vital signs reviewed and stable  Post vital signs: Reviewed and stable  Last Vitals:  Vitals Value Taken Time  BP 130/67 12/17/2018  5:42 PM  Temp 36.2 C 12/17/2018  5:42 PM  Pulse 102 12/17/2018  5:45 PM  Resp 24 12/17/2018  5:45 PM  SpO2 100 % 12/17/2018  5:45 PM  Vitals shown include unvalidated device data.  Last Pain:  Vitals:   12/17/18 1742  TempSrc:   PainSc: Asleep      Patients Stated Pain Goal: 0 (48/01/65 [id number])  Complications: No apparent anesthesia complications

## 2018-12-17 NOTE — Progress Notes (Signed)
[facility] [place] NAME: Mason Wilson    MR#:  [id number]  DATE OF BIRTH:  April 25, 1941  SUBJECTIVE:   Patient having some urine leaking around his chronic Foley catheter site, remains volume overloaded, with significant anasarca.  Plan for US guided paracentesis today.  Pt's wife is at bedside.    REVIEW OF SYSTEMS:    Review of Systems  Constitutional: Negative for chills and fever.  HENT: Negative for congestion and tinnitus.   Eyes: Negative for blurred vision and double vision.  Respiratory: Negative for cough, shortness of breath and wheezing.   Cardiovascular: Positive for leg swelling. Negative for chest pain, orthopnea and PND.  Gastrointestinal: Negative for abdominal pain, diarrhea, nausea and vomiting.       Abdominal distension.   Genitourinary: Negative for dysuria and hematuria.  Neurological: Negative for dizziness, sensory change and focal weakness.  All other systems reviewed and are negative.   Nutrition: 2 gm sodium.  Tolerating Diet: Yes Tolerating PT: Await Eval.   DRUG ALLERGIES:   Allergies  Allergen Reactions  . Altace [Ramipril] Cough    VITALS:  Blood pressure 133/66, pulse 85, temperature 98.1 F (36.7 C), temperature source Oral, resp. rate 18, height 5\' 9"  (1.753 m), weight 114.3 kg, SpO2 96 %.  PHYSICAL EXAMINATION:   Physical Exam  GENERAL:  78 y.o.-year-old obese patient lying in bed in no acute distress.  EYES: Pupils equal, round, reactive to light and accommodation. No scleral icterus. Extraocular muscles intact.  HEENT: Head atraumatic, normocephalic. Oropharynx and nasopharynx clear.  NECK:  Supple, no jugular venous distention. No thyroid enlargement, no tenderness.  LUNGS: Normal breath sounds bilaterally, no wheezing, rales, rhonchi. No use of accessory muscles of respiration.  CARDIOVASCULAR: S1, S2 normal. No murmurs, rubs, or gallops.  ABDOMEN: Soft, nontender, distended, +  fluid wave with ascites. Bowel sounds present. No organomegaly or mass.  EXTREMITIES: No cyanosis, clubbing, + 2 edema b/l.    NEUROLOGIC: Cranial nerves II through XII are intact. No focal Motor or sensory deficits b/l.  Globally weak.  PSYCHIATRIC: The patient is alert and oriented x 3.  SKIN: No obvious rash, lesion, or ulcer.   GU-pt. Scrotal swelling with foley cath in place with some blood tinged urine in bag.   LABORATORY PANEL:   CBC Recent Labs  Lab 12/17/18 0317  WBC 2.9*  HGB 7.3*  HCT 24.4*  PLT 79*   ------------------------------------------------------------------------------------------------------------------  Chemistries  Recent Labs  Lab 12/17/18 0317  NA 144  K 4.8  CL 113*  CO2 27  GLUCOSE 166*  BUN 38*  CREATININE 1.52*  CALCIUM 8.5*  AST 24  ALT 17  ALKPHOS 99  BILITOT 1.4*   ------------------------------------------------------------------------------------------------------------------  Cardiac Enzymes No results for input(s): TROPONINI in the last 168 hours. ------------------------------------------------------------------------------------------------------------------  RADIOLOGY:  Ct Abdomen Pelvis Wo Contrast  Result Date: 12/15/2018 CLINICAL DATA:  History of recent TURP. EXAM: CT ABDOMEN AND PELVIS WITHOUT CONTRAST TECHNIQUE: Multidetector CT imaging of the abdomen and pelvis was performed following the standard protocol without IV contrast. COMPARISON:  CT scan 11/26/2018 FINDINGS: Lower chest: The heart is upper limits of normal in size and stable. Stable three-vessel coronary artery calcifications. There is a small left pleural effusion with overlying atelectasis. Hepatobiliary: Stable advanced cirrhotic changes involving the liver but no obvious hepatic mass. The gallbladder is grossly normal. No common bile duct dilatation. Pancreas: No mass, inflammation or ductal dilatation. Moderate atrophy. Spleen: Stable splenomegaly.  Adrenals/Urinary Tract: The adrenal glands and kidneys are unremarkable and stable. The bladder contains a Foley catheter. 200 cc of contrast was put in the bladder. No asymmetric bladder wall thickening or bladder mass is identified. The bladder wall is intact. There is extravasation of contrast into the prostate gland and 2 areas 1 in the left mid gland and 1 in the lower right gland. These are areas of previous cystic appearing cavities are abscesses on prior CT scan. Stomach/Bowel: The stomach, duodenum, small bowel and colon are grossly normal. No acute inflammatory changes, mass lesions or obstructive findings. Vascular/Lymphatic: Stable aortic calcifications. Stable scattered mesenteric and retroperitoneal lymph nodes. Reproductive: Expected surgical changes involving the prostate gland from the recent TURP. Extravasation of contrast into the prostate gland as stated above. Other: Large volume abdominal/pelvic ascites and diffuse body wall edema. Musculoskeletal: No significant bony findings. IMPRESSION: 1. The bladder is intact.  No bladder rupture or leakage. 2. Two areas of extravasation of contrast into the prostate gland. I assume this is normal after a TURP. 3. Advanced cirrhotic changes involving the liver with associated splenomegaly and large volume abdominal/pelvic ascites. Electronically Signed   By: Marijo Sanes M.D.   On: 12/15/2018 16:20   US Renal  Result Date: 12/15/2018 CLINICAL DATA:  78 y/o  M; hydronephrosis. EXAM: RENAL / URINARY TRACT ULTRASOUND COMPLETE COMPARISON:  11/26/2018 CT abdomen and pelvis. FINDINGS: Right Kidney: Renal measurements: 13.0 x 6.7 x 5.7 cm = volume: 259 mL . Echogenicity within normal limits. No mass or hydronephrosis visualized. Cortical atrophy. Lower pole simple cyst measuring up to 2.8 cm. Left Kidney: Renal measurements: 12.1 x 6.9 x 5.2 cm = volume: 225 mL. Echogenicity within normal limits. No mass or hydronephrosis visualized. Cortical atrophy.  Interpolar echogenic shadowing stone measuring up to 9 mm. Bladder: Not visualized.  Ascites. IMPRESSION: 1. Hydronephrosis. Mild cortical atrophy of bilateral kidneys. Left kidney interpolar 9 mm stone. 2. Ascites. Electronically Signed   By: Kristine Garbe M.D.   On: 12/15/2018 13:59   US Venous Img Lower Bilateral  Result Date: 12/16/2018 CLINICAL DATA:  78 year old male with bilateral lower extremity edema EXAM: BILATERAL LOWER EXTREMITY VENOUS DOPPLER ULTRASOUND TECHNIQUE: Gray-scale sonography with graded compression, as well as color Doppler and duplex ultrasound were performed to evaluate the lower extremity deep venous systems from the level of the common femoral vein and including the common femoral, femoral, profunda femoral, popliteal and calf veins including the posterior tibial, peroneal and gastrocnemius veins when visible. The superficial great saphenous vein was also interrogated. Spectral Doppler was utilized to evaluate flow at rest and with distal augmentation maneuvers in the common femoral, femoral and popliteal veins. COMPARISON:  None. FINDINGS: RIGHT LOWER EXTREMITY Common Femoral Vein: No evidence of thrombus. Normal compressibility, respiratory phasicity and response to augmentation. Saphenofemoral Junction: No evidence of thrombus. Normal compressibility and flow on color Doppler imaging. Profunda Femoral Vein: No evidence of thrombus. Normal compressibility and flow on color Doppler imaging. Femoral Vein: No evidence of thrombus. Normal compressibility, respiratory phasicity and response to augmentation. Popliteal Vein: No evidence of thrombus. Normal compressibility, respiratory phasicity and response to augmentation. Calf Veins: No evidence of thrombus. Normal compressibility and flow on color Doppler imaging. Superficial Great Saphenous Vein: No evidence of thrombus. Normal compressibility. Venous Reflux:  None. Other Findings:  None. LEFT LOWER EXTREMITY Common Femoral  Vein: No evidence of thrombus. Normal compressibility, respiratory phasicity and response to augmentation. Saphenofemoral Junction: No evidence of thrombus. Normal compressibility and flow on color Doppler  imaging. Profunda Femoral Vein: No evidence of thrombus. Normal compressibility and flow on color Doppler imaging. Femoral Vein: No evidence of thrombus. Normal compressibility, respiratory phasicity and response to augmentation. Popliteal Vein: No evidence of thrombus. Normal compressibility, respiratory phasicity and response to augmentation. Calf Veins: No evidence of thrombus. Normal compressibility and flow on color Doppler imaging. Superficial Great Saphenous Vein: No evidence of thrombus. Normal compressibility. Venous Reflux:  None. Other Findings:  None. IMPRESSION: No evidence of deep venous thrombosis. Electronically Signed   By: Jacqulynn Cadet M.D.   On: 12/16/2018 09:42     ASSESSMENT AND PLAN:   78 year old male with past medical history of myasthenia gravis, hypertension, hyperlipidemia, GERD, diabetes, nonalcoholic liver cirrhosis who presented to the hospital due to abdominal distention, worsening lower extremity edema noted to be in acute kidney injury.  1.  Acute renal failure- multifactorial in nature related to suspected urinary retention status post relative hypotension and blood loss from rectal bleeding. - improving with IV fluids, now having some worsening Urinary retention with urine leaking around the chronic foley.  - Urology consulted and they plan on doing cystoscopy and replacing foley.   -  Renal dose meds, avoid nephrotoxins. Will transfuse one unit and follow Hg.    2.  Urinary retention/chronic indwelling Foley-patient having a complication as he is leaking around his Foley catheter site. - Urology consulted and they plan on doing a cystoscopy and replacing Foley catheter.  Continue further care as per them.  3.  Anasarca-secondary to nonalcoholic fatty liver  disease.  Patient has tense ascites with worsening lower extremity edema. -Plan for ultrasound-guided paracentesis today.  4.  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 - Hg. Down to 7.3 and having some blood tinged urine and will transfuse 1 unit today  - cont. Protonix, follow Hg.  -Continue Protonix, follow serial hemoglobin.  Plan for ultrasound-guided paracentesis today.   5.  History of myasthenia gravis-continue Pyridostigmine  6.  History of recent prostate abscess with bacteremia- continue oral cefdinir.  7.  Diabetes type 2 without complication-continue sliding scale insulin.  8.  BPH-continue Flomax.  9.  Hyperlipidemia-continue simvastatin.    All the records are reviewed and case discussed with Care Management/Social Worker. Management plans discussed with the patient, family and they are in agreement.  CODE STATUS: DNR  DVT Prophylaxis: Ted's & SCD's  TOTAL TIME TAKING CARE OF THIS PATIENT: 35 minutes.   POSSIBLE D/C IN 2-3 DAYS, DEPENDING ON CLINICAL CONDITION.   Henreitta Leber M.D on 12/17/2018 at 1:52 PM  Between 7am to 6pm - Pager - [phone]  After 6pm go to [email or web address] - Proofreader  [facility]  Office  [phone]  CC: Primary care physician; [name], MD

## 2018-12-17 NOTE — Op Note (Addendum)
Preoperative diagnosis: 1. Bladder outlet obstruction secondary to BPH 2. Urinary retention 3. Displaced Foley catheter 4. Gross hematuria 5. History of prostatic abscess  Postoperative diagnosis:  1. Same  Procedure:  1. Cystoscopy 2. Bipolar transurethral resection of the prostate  Surgeon: Marton Redwood, III. M.D.  Anesthesia: General  Complications: None  EBL: Minimal  Specimens: 1. Prostate chips  Indication: Mason Wilson. is a 78 year old male recently underwent a transurethral resection of prostatic abscess by Dr. Jeffie Pollock on 11/26/2018.  He recovered from this and his catheter was removed.  He presented to the urology clinic on Friday with urinary retention.  A Foley catheter was placed but there was minimal return of urine.  He therefore was sent to the emergency department where a CT cystogram was performed.  It showed the catheter was in good position.  There was no obvious rupture of the bladder.  The patient has been in a significant amount of discomfort.  He has some large volume ascites.  This is caused some mild penile and scrotal edema.  He has also had his catheter exchanged 3 or 4 times.  Each time, it causes him significant pain and discomfort.  Currently, his Foley catheter is not draining and it feels like it is not in proper position.  He does not feel like he can undergo another catheter exchange physically and mentally.  There is just too much pain which each removal and attempted replacement.  In addition to this, he has been noted to have some clots.  Patient hemoglobin is 7.3.  He has been having intermittent rectal bleeding.  Baseline, he has been having issues with recurrent urinary tract infections as well as obstructive lower urinary tract symptoms.   Description of procedure:  The patient was taken to the operating room and general anesthesia was induced.  The patient was placed in the dorsal lithotomy position, prepped and draped in the usual sterile  fashion, and preoperative antibiotics were administered. A preoperative time-out was performed.   Cystourethroscopy was performed.  The patient's urethra was examined and demonstrated bilobar prostatic hypertrophy with a small median lobe.  There were 2 prior TUR defect from his prostatic abscess drainage.  The bladder was then systematically examined in its entirety. There was no evidence of any bladder tumors, stones, or other mucosal pathology.  The ureteral orifices were identified and marked so as to be avoided during the procedure.  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  The prostate adenoma from the bladder neck back to the verumontanum was resected beginning at the six o'clock position and then extended to include the right and left lobes of the prostate and anterior prostate. Care was taken not to resect distal to the verumontanum.  I did not perform as extensive resection as I typically would given his low platelet count and concern for bleeding.  I was extremely meticulous in regards to obtaining hemostasis after each resection of the prostate.  Overall, at the conclusion, he had a much wider open channel that will hopefully allow for him to have unrestricted voiding.  Hemostasis was then achieved with the cautery and the bladder was emptied and reinspected with no significant bleeding noted at the end of the procedure.    A 30F 3 way catheter was then placed into the bladder and continuous bladder irrigation was started.  The patient appeared to tolerate the procedure well and without complications.  The patient was able  to be awakened and transferred to the recovery unit in satisfactory condition.

## 2018-12-18 ENCOUNTER — Encounter: Payer: Self-pay | Admitting: Urology

## 2018-12-18 DIAGNOSIS — D649 Anemia, unspecified: Secondary | ICD-10-CM

## 2018-12-18 DIAGNOSIS — N178 Other acute kidney failure: Secondary | ICD-10-CM

## 2018-12-18 DIAGNOSIS — K7469 Other cirrhosis of liver: Secondary | ICD-10-CM

## 2018-12-18 LAB — BASIC METABOLIC PANEL
ANION GAP: 5 (ref 5–15)
BUN: 26 mg/dL — ABNORMAL HIGH (ref 8–23)
CO2: 25 mmol/L (ref 22–32)
Calcium: 8.2 mg/dL — ABNORMAL LOW (ref 8.9–10.3)
Chloride: 113 mmol/L — ABNORMAL HIGH (ref 98–111)
Creatinine, Ser: 1.1 mg/dL (ref 0.61–1.24)
GFR calc Af Amer: >60 mL/min (ref >60)
GLUCOSE: 117 mg/dL — AB (ref 70–99)
Potassium: 4.2 mmol/L (ref 3.5–5.1)
Sodium: 143 mmol/L (ref 135–145)

## 2018-12-18 LAB — CBC
HCT: 22.6 % — ABNORMAL LOW (ref 39.0–52.0)
Hemoglobin: 7.1 g/dL — ABNORMAL LOW (ref 13.0–17.0)
MCH: 31.4 pg (ref 26.0–34.0)
MCHC: 31.4 g/dL (ref 30.0–36.0)
MCV: 100 fL (ref 80.0–100.0)
Platelets: 69 10*3/uL — ABNORMAL LOW (ref 150–400)
RBC: 2.26 MIL/uL — AB (ref 4.22–5.81)
RDW: 17.6 % — ABNORMAL HIGH (ref 11.5–15.5)
WBC: 2.3 10*3/uL — ABNORMAL LOW (ref 4.0–10.5)
nRBC: 0 % (ref 0.0–0.2)

## 2018-12-18 LAB — GLUCOSE, CAPILLARY
Glucose-Capillary: 108 mg/dL — ABNORMAL HIGH (ref 70–99)
Glucose-Capillary: 196 mg/dL — ABNORMAL HIGH (ref 70–99)
Glucose-Capillary: 167 mg/dL — ABNORMAL HIGH (ref 70–99)
Glucose-Capillary: 184 mg/dL — ABNORMAL HIGH (ref 70–99)

## 2018-12-18 LAB — PREPARE RBC (CROSSMATCH)

## 2018-12-18 MED ORDER — SODIUM CHLORIDE 0.9% IV SOLUTION
Freq: Once | INTRAVENOUS | Status: AC
  Administered 2018-12-18: 11:00:00 via INTRAVENOUS

## 2018-12-18 MED ORDER — SPIRONOLACTONE 25 MG PO TABS
100.0000 mg | ORAL_TABLET | Freq: Every day | ORAL | Status: DC
  Administered 2018-12-18 – 2018-12-20 (×3): 100 mg via ORAL
  Filled 2018-12-18 (×3): qty 4

## 2018-12-18 MED ORDER — FUROSEMIDE 40 MG PO TABS
40.0000 mg | ORAL_TABLET | Freq: Every day | ORAL | Status: DC
  Administered 2018-12-18: 40 mg via ORAL
  Filled 2018-12-18: qty 1

## 2018-12-18 NOTE — Care Management Important Message (Signed)
Copy of signed Medicare IM left with patient in room. 

## 2018-12-18 NOTE — Progress Notes (Signed)
[facility]  ROUNDING NOTE   Subjective:   TURP yesterday by Dr. [name].   PRBC today.   Large volume paracentesis yesterday. 4.8 liters removed.    Objective:  Vital signs in last 24 hours:  Temp:  [97.2 F (36.2 C)-98.9 F (37.2 C)] 98.9 F (37.2 C) (02/10 1501) Pulse Rate:  [79-109] 91 (02/10 1501) Resp:  [16-25] 18 (02/10 1501) BP: (120-138)/(60-75) 130/63 (02/10 1501) SpO2:  [94 %-100 %] 97 % (02/10 1501) Weight:  [112.6 kg] 112.6 kg (02/10 0502)  Weight change:  Filed Weights   12/15/18 2026 12/16/18 0431 12/18/18 0502  Weight: 116 kg 114.3 kg 112.6 kg    Intake/Output: I/O last 3 completed shifts: In: 40981 [I.V.:403; Blood:362; XBJYN:82956] Out: 10950 [OZHYQ:65784]   Intake/Output this shift:  Total I/O In: 4164 [P.O.:840; Blood:324; Other:3000] Out: 3800 [Urine:3800]  Physical Exam: General: No acute distress  Head: Normocephalic, atraumatic. Moist oral mucosal membranes  Eyes: Anicteric  Neck: Supple, trachea midline  Lungs:  Clear to auscultation, normal effort  Heart: S1S2 no rubs  Abdomen:  +distended  Extremities: trace peripheral edema.  Neurologic: Awake, alert, following commands  Skin: No lesions  GU:  Foley in place    Basic Metabolic Panel: Recent Labs  Lab 12/15/18 1202 12/16/18 0405 12/17/18 0317 12/18/18 0408  NA 143 143 144 143  K 4.1 4.6 4.8 4.2  CL 111 112* 113* 113*  CO2 24 26 27 25   GLUCOSE 116* 112* 166* 117*  BUN 47* 43* 38* 26*  CREATININE 2.04* 1.86* 1.52* 1.10  CALCIUM 8.9 8.4* 8.5* 8.2*    Liver Function Tests: Recent Labs  Lab 12/15/18 1202 12/17/18 0317  AST 30 24  ALT 21 17  ALKPHOS 102 99  BILITOT 1.4* 1.4*  PROT 6.2* 5.6*  ALBUMIN 3.0* 2.7*   No results for input(s): LIPASE, AMYLASE in the last 168 hours. No results for input(s): AMMONIA in the last 168 hours.  CBC: Recent Labs  Lab 12/15/18 1202 12/16/18 0405 12/17/18 0317 12/17/18 1908 12/18/18 0408  WBC 4.9 2.9* 2.9*  --   2.3*  HGB 8.9* 7.1* 7.3* 8.4* 7.1*  HCT 28.9* 23.5* 24.4* 27.2* 22.6*  MCV 100.0 100.4* 101.7*  --  100.0  PLT 115* 80* 79*  --  69*    Cardiac Enzymes: No results for input(s): CKTOTAL, CKMB, CKMBINDEX, TROPONINI in the last 168 hours.  BNP: Invalid input(s): POCBNP  CBG: Recent Labs  Lab 12/17/18 1205 12/17/18 1838 12/17/18 1951 12/18/18 0751 12/18/18 1152  GLUCAP 163* 144* 141* 108* 196*    Microbiology: Results for orders placed or performed during the hospital encounter of 12/15/18  Urine culture     Status: None   Collection Time: 12/15/18  2:48 PM  Result Value Ref Range Status   Specimen Description URINE, RANDOM  Final   Special Requests   Final    NONE Performed at [facility], [address] [place], [place] [address]    Culture NO GROWTH  Final   Report Status 12/16/2018 FINAL  Final  C difficile quick scan w PCR reflex     Status: None   Collection Time: 12/15/18  6:10 PM  Result Value Ref Range Status   C Diff antigen NEGATIVE NEGATIVE Final   C Diff toxin NEGATIVE NEGATIVE Final   C Diff interpretation No C. difficile detected.  Final    Comment: Performed at [facility], [address] [place], [place] [address]  Body fluid culture  Status: None (Preliminary result)   Collection Time: 12/17/18  1:35 PM  Result Value Ref Range Status   Specimen Description PERITONEAL FLUID  Final   Special Requests   Final    NONE Performed at [facility], [address] [place], [place] [address]    Gram Stain   Final    WBC PRESENT,BOTH PMN AND MONONUCLEAR NO ORGANISMS SEEN CYTOSPIN SMEAR    Culture PENDING  Incomplete   Report Status PENDING  Incomplete    Coagulation Studies: No results for input(s): LABPROT, INR in the last 72 hours.  Urinalysis: No results for input(s): COLORURINE, LABSPEC, PHURINE, GLUCOSEU, HGBUR, BILIRUBINUR, KETONESUR, PROTEINUR, UROBILINOGEN, NITRITE, LEUKOCYTESUR in the last 72  hours.  Invalid input(s): APPERANCEUR    Imaging: US Paracentesis  Result Date: 12/17/2018 INDICATION: Patient with history of cirrhosis, now with ascites. Request is made for diagnostic and therapeutic paracentesis. EXAM: ULTRASOUND GUIDED DIAGNOSTIC AND THERAPEUTIC PARACENTESIS MEDICATIONS: 10 mL 1% lidocaine COMPLICATIONS: None immediate. PROCEDURE: Informed written consent was obtained from the patient after a discussion of the risks, benefits and alternatives to treatment. A timeout was performed prior to the initiation of the procedure. Initial ultrasound scanning demonstrates a moderate amount of ascites within the right lower abdominal quadrant. The right lower abdomen was prepped and draped in the usual sterile fashion. 1% lidocaine was used for local anesthesia. Following this, a Safe-T-Centesis catheter was introduced. An ultrasound image was saved for documentation purposes. The paracentesis was performed. The catheter was removed and a dressing was applied. The patient tolerated the procedure well without immediate post procedural complication. FINDINGS: A total of approximately 4.8 liters of yellow fluid was removed. Samples were sent to the laboratory as requested by the clinical team. IMPRESSION: Successful ultrasound-guided diagnostic and therapeutic paracentesis yielding 4.8 liters of peritoneal fluid. Read by: Brynda Greathouse PA-C Electronically Signed   By: Corrie Mckusick D.O.   On: 12/17/2018 14:53     Medications:    . cefdinir  300 mg Oral Q12H  . furosemide  40 mg Oral Daily  . insulin aspart  0-5 Units Subcutaneous QHS  . insulin aspart  0-9 Units Subcutaneous TID WC  . multivitamin with minerals  1 tablet Oral Daily  . pantoprazole  40 mg Oral Daily  . phenazopyridine  100 mg Oral TID WC  . pyridostigmine  60 mg Oral QID  . simvastatin  40 mg Oral q1800  . spironolactone  100 mg Oral Daily  . tamsulosin  0.4 mg Oral Daily   docusate sodium, HYDROcodone-acetaminophen,  ipratropium-albuterol, loperamide, opium-belladonna, traMADol  Assessment/ Plan:   [name]. is a 78 y.o. white male with coronary artery disease, myasthenia gravis, chronic back pain, nonalcoholic cirrhosis, diabetes mellitus type 2, GERD, hyperlipidemia, hypertension, history of melanoma, nephrolithiasis who was admitted to [facility] on 12/15/2018 for evaluation of rectal bleeding and acute renal failure.  1.  Acute renal failure: secondary to obstructive uropathy.  Now with nonoliguric urine output with post obstructive diuresis Creatinine back to baseline Monitor volume status and serum electrolytes.   2.  Anemia with renal failure and GI bleed. Hemoglobin 7.1 PRBC ordered today.   3.  Cirrhosis of the liver with ascites. Status post large volume paracentesis on 2/9.  - spironolactone.  - Will need to restart furosemide.    LOS: [place] 2/10/20203:31 PM

## 2018-12-18 NOTE — Progress Notes (Signed)
   Subjective Afebrile overnight, s/p TURP with Dr. [name] yesterday Urine clear on slow CBI Denies pelvic/bladder pain  Physical Exam: BP 134/60 (BP Location: Right Arm)   Pulse 93   Temp 98.6 F (37 C) (Oral)   Resp 20   Ht 5\' 9"  (1.753 m)   Wt 112.6 kg   SpO2 96%   BMI 36.66 kg/m    Constitutional:  Alert and oriented, No acute distress. Respiratory: Normal respiratory effort, no increased work of breathing. GI: Abdomen is distended, non-tender GU: Significant penile and scrotal edema, no erythema or tenderness Drains: 61F 3-way foley with clear yellow urine on slow CBI  Laboratory Data: Reviewed Urine culture 2/7 no growth  Assessment & Plan:   Mason Wilson is a 78 year old male with complex history.  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  He then underwent emergent transurethral resection of these lesions for drainage with Dr. Jeffie Pollock and catheter placement.  Urine cultures at that time grew both E. coli and Klebsiella.  He has been on culture appropriate Cefdinir.   He represented to urology clinic Friday 2/7 with possible urinary retention with small voids and elevated bladder scan.  He was evaluated in the emergency room which showed severe cirrhosis and ascites.  I strongly suspect the patient was never truly in urinary retention, but was having falsely elevated PVRs secondary to his ascites.  He has had multiple catheters replaced over the weekend, and ultimately underwent a TURP with Dr. [name] on 12/17/2018.   CBI capped, maintain Foley to drainage Do not remove Foley catheter without discussing with urology Continue cefdinir Agree with hospitalist work-up of ascites Transfusion per hospitalist Call if questions   Billey Co, MD

## 2018-12-18 NOTE — Progress Notes (Signed)
[facility] [place] NAME: Mason Wilson    MR#:  [id number]  DATE OF BIRTH:  10/22/1941  SUBJECTIVE:   Patient is status post TURP and Foley catheter placement yesterday.  Also was transfused and hemoglobin did improve but is back down to 7.1 today.  Will transfuse 1 more unit.  Denies any rectal bleeding.  Hematuria has resolved.  Patient is on gentle CBI and urine is clear.  Renal function is stable.  Overall feels better  REVIEW OF SYSTEMS:    Review of Systems  Constitutional: Negative for chills and fever.  HENT: Negative for congestion and tinnitus.   Eyes: Negative for blurred vision and double vision.  Respiratory: Negative for cough, shortness of breath and wheezing.   Cardiovascular: Positive for leg swelling. Negative for chest pain, orthopnea and PND.  Gastrointestinal: Negative for abdominal pain, diarrhea, nausea and vomiting.       Abdominal distension.   Genitourinary: Negative for dysuria and hematuria.  Neurological: Negative for dizziness, sensory change and focal weakness.  All other systems reviewed and are negative.   Nutrition: 2 gm sodium.  Tolerating Diet: Yes Tolerating PT: Await Eval.   DRUG ALLERGIES:   Allergies  Allergen Reactions  . Altace [Ramipril] Cough  . Ciprofloxacin Other (See Comments)    Contraindicated in patients with myasthenia gravis     VITALS:  Blood pressure 125/64, pulse 81, temperature 98.2 F (36.8 C), temperature source Oral, resp. rate 19, height 5\' 9"  (1.753 m), weight 112.6 kg, SpO2 97 %.  PHYSICAL EXAMINATION:   Physical Exam  GENERAL:  78 y.o.-year-old obese patient lying in bed in no acute distress.  EYES: Pupils equal, round, reactive to light and accommodation. No scleral icterus. Extraocular muscles intact.  HEENT: Head atraumatic, normocephalic. Oropharynx and nasopharynx clear.  NECK:  Supple, no jugular venous distention. No thyroid enlargement, no  tenderness.  LUNGS: Normal breath sounds bilaterally, no wheezing, rales, rhonchi. No use of accessory muscles of respiration.  CARDIOVASCULAR: S1, S2 normal. No murmurs, rubs, or gallops.  ABDOMEN: Soft, nontender, distended, . Bowel sounds present. No organomegaly or mass.  EXTREMITIES: No cyanosis, clubbing, + 2 edema b/l.    NEUROLOGIC: Cranial nerves II through XII are intact. No focal Motor or sensory deficits b/l.  Globally weak.  PSYCHIATRIC: The patient is alert and oriented x 3.  SKIN: No obvious rash, lesion, or ulcer.   GU-pt. Scrotal swelling with foley cath in place with clear yellow urine draining.   LABORATORY PANEL:   CBC Recent Labs  Lab 12/18/18 0408  WBC 2.3*  HGB 7.1*  HCT 22.6*  PLT 69*   ------------------------------------------------------------------------------------------------------------------  Chemistries  Recent Labs  Lab 12/17/18 0317 12/18/18 0408  NA 144 143  K 4.8 4.2  CL 113* 113*  CO2 27 25  GLUCOSE 166* 117*  BUN 38* 26*  CREATININE 1.52* 1.10  CALCIUM 8.5* 8.2*  AST 24  --   ALT 17  --   ALKPHOS 99  --   BILITOT 1.4*  --    ------------------------------------------------------------------------------------------------------------------  Cardiac Enzymes No results for input(s): TROPONINI in the last 168 hours. ------------------------------------------------------------------------------------------------------------------  RADIOLOGY:  US Paracentesis  Result Date: 12/17/2018 INDICATION: Patient with history of cirrhosis, now with ascites. Request is made for diagnostic and therapeutic paracentesis. EXAM: ULTRASOUND GUIDED DIAGNOSTIC AND THERAPEUTIC PARACENTESIS MEDICATIONS: 10 mL 1% lidocaine COMPLICATIONS: None immediate. PROCEDURE: Informed written consent was obtained from the patient after a discussion of the  risks, benefits and alternatives to treatment. A timeout was performed prior to the initiation of the procedure.  Initial ultrasound scanning demonstrates a moderate amount of ascites within the right lower abdominal quadrant. The right lower abdomen was prepped and draped in the usual sterile fashion. 1% lidocaine was used for local anesthesia. Following this, a Safe-T-Centesis catheter was introduced. An ultrasound image was saved for documentation purposes. The paracentesis was performed. The catheter was removed and a dressing was applied. The patient tolerated the procedure well without immediate post procedural complication. FINDINGS: A total of approximately 4.8 liters of yellow fluid was removed. Samples were sent to the laboratory as requested by the clinical team. IMPRESSION: Successful ultrasound-guided diagnostic and therapeutic paracentesis yielding 4.8 liters of peritoneal fluid. Read by: Brynda Greathouse PA-C Electronically Signed   By: Corrie Mckusick D.O.   On: 12/17/2018 14:53     ASSESSMENT AND PLAN:   78 year old male with past medical history of myasthenia gravis, hypertension, hyperlipidemia, GERD, diabetes, nonalcoholic liver cirrhosis who presented to the hospital due to abdominal distention, worsening lower extremity edema noted to be in acute kidney injury.  1.  Acute renal failure- multifactorial in nature related to suspected urinary retention status post relative hypotension and blood loss from rectal bleeding. Much improved with blood transfusions, diuretics and some gentle IV fluids. - Also status post cystoscopy with TURP yesterday and gentle CBI and Foley catheter change.  Renal function at baseline now.  We will continue to follow BUN/creatinine.  2.  Urinary retention/chronic indwelling Foley-patient having a complication as he was leaking around his Foley catheter site. -Seen by urology and status post TURP with replacement of his Foley catheter.  Continue gentle CBI, urine is clearing up.  No further hematuria or clots.  Denies any significant abdominal pain and feels better.   Further care as per urology.  3.  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   -Status post ultrasound-guided paracentesis yesterday with 4.5 L of fluid removed.  Patient overall feels better.  Discussed with gastroenterology and and since renal function is back to baseline.  Started on some oral Lasix and Aldactone today.  4.  Anemia/thrombocytopenia-secondary to chronic liver disease.  Hemoglobin down to 7.1 again today.  Will transfuse 1 unit packed red blood cells.  Follow hemoglobin.  No acute bleeding presently.  Follow platelet count.  5.  History of myasthenia gravis-continue Pyridostigmine  6.  History of recent prostate abscess with bacteremia- continue oral cefdinir. - stable.    7.  Diabetes type 2 without complication-continue sliding scale insulin. BS stable.   8.  BPH-continue Flomax. - no urinary retention s/p TURP and replacement of foley yesterday.   9.  Hyperlipidemia-continue simvastatin.   Will get PT eval.    All the records are reviewed and case discussed with Care Management/Social Worker. Management plans discussed with the patient, family and they are in agreement.  CODE STATUS: DNR  DVT Prophylaxis: Ted's & SCD's  TOTAL TIME TAKING CARE OF THIS PATIENT: 30 minutes.   POSSIBLE D/C IN 2-3 DAYS, DEPENDING ON CLINICAL CONDITION.   Henreitta Leber M.D on 12/18/2018 at 1:12 PM  Between 7am to 6pm - Pager - [phone]  After 6pm go to [email or web address] - Proofreader  [facility]  Office  [phone]  CC: Primary care physician; [name], MD

## 2018-12-18 NOTE — Progress Notes (Signed)
PT Cancellation Note  Patient Details Name: Mason Wilson. MRN: [id number] DOB: 07-25-1941   Cancelled Treatment:    Reason Eval/Treat Not Completed: Other (comment)(Pt currently recieving blood transfusion, PT will follow up when patient is more medically appropriate for physical therapy evaluation.)   Lieutenant Diego PT, DPT 1:37 PM,12/18/18 [phone]

## 2018-12-18 NOTE — Progress Notes (Signed)
Ch spoke w/ pt wife to find out why pt was hospitalized after a recent d/c. Wife shared that he had issues again with bleeding and the liver disease. Ch f/u w/ pt in his room during a time when nurse was administering meds. Ch did not visit long but shred words of encouragement to pt and wife present as Pt prepared to visit w/ pt. No f/u needed at this time.     12/18/18 1300  Clinical Encounter Type  Visited With Patient and family together  Visit Type Follow-up;Psychological support;Spiritual support;Social support  Referral From Family  Consult/Referral To Chaplain  Spiritual Encounters  Spiritual Needs Emotional;Grief support  Stress Factors  Patient Stress Factors Health changes;Major life changes;Loss of control  Family Stress Factors Major life changes

## 2018-12-19 ENCOUNTER — Inpatient Hospital Stay: Payer: Medicare Other

## 2018-12-19 LAB — CBC
HCT: 27.4 % — ABNORMAL LOW (ref 39.0–52.0)
Hemoglobin: 8.6 g/dL — ABNORMAL LOW (ref 13.0–17.0)
MCH: 30.2 pg (ref 26.0–34.0)
MCHC: 31.4 g/dL (ref 30.0–36.0)
MCV: 96.1 fL (ref 80.0–100.0)
Platelets: 74 10*3/uL — ABNORMAL LOW (ref 150–400)
RBC: 2.85 MIL/uL — ABNORMAL LOW (ref 4.22–5.81)
RDW: 17.6 % — ABNORMAL HIGH (ref 11.5–15.5)
WBC: 3.1 10*3/uL — ABNORMAL LOW (ref 4.0–10.5)
nRBC: 0 % (ref 0.0–0.2)

## 2018-12-19 LAB — PROTEIN, BODY FLUID (OTHER): Total Protein, Body Fluid Other: 0.7 g/dL

## 2018-12-19 LAB — BASIC METABOLIC PANEL
ANION GAP: 4 — AB (ref 5–15)
BUN: 20 mg/dL (ref 8–23)
CALCIUM: 8.3 mg/dL — AB (ref 8.9–10.3)
CO2: 25 mmol/L (ref 22–32)
Chloride: 111 mmol/L (ref 98–111)
Creatinine, Ser: 1.23 mg/dL (ref 0.61–1.24)
GFR calc Af Amer: >60 mL/min (ref >60)
GFR calc non Af Amer: 57 mL/min — ABNORMAL LOW (ref >60)
GLUCOSE: 150 mg/dL — AB (ref 70–99)
Potassium: 3.9 mmol/L (ref 3.5–5.1)
Sodium: 140 mmol/L (ref 135–145)

## 2018-12-19 LAB — BPAM RBC
Blood Product Expiration Date: 202002182359
Blood Product Expiration Date: 202002242359
ISSUE DATE / TIME: 202002091456
ISSUE DATE / TIME: 202002101134
Unit Type and Rh: 600
Unit Type and Rh: 600

## 2018-12-19 LAB — GLUCOSE, CAPILLARY
GLUCOSE-CAPILLARY: 136 mg/dL — AB (ref 70–99)
Glucose-Capillary: 186 mg/dL — ABNORMAL HIGH (ref 70–99)
Glucose-Capillary: 202 mg/dL — ABNORMAL HIGH (ref 70–99)

## 2018-12-19 LAB — TYPE AND SCREEN
ABO/RH(D): AB NEG
Antibody Screen: NEGATIVE
Unit division: 0
Unit division: 0

## 2018-12-19 LAB — SURGICAL PATHOLOGY

## 2018-12-19 LAB — CYTOLOGY - NON PAP

## 2018-12-19 MED ORDER — FUROSEMIDE 40 MG PO TABS
80.0000 mg | ORAL_TABLET | Freq: Every day | ORAL | Status: DC
  Administered 2018-12-19 – 2018-12-20 (×2): 80 mg via ORAL
  Filled 2018-12-19 (×2): qty 2

## 2018-12-19 MED ORDER — NYSTATIN 100000 UNIT/GM EX POWD
Freq: Two times a day (BID) | CUTANEOUS | Status: DC
  Administered 2018-12-19 – 2018-12-20 (×3): via TOPICAL
  Filled 2018-12-19: qty 15

## 2018-12-19 NOTE — Progress Notes (Signed)
Pt on Home CPAP. No distress noted. Will continue to monitor pt.

## 2018-12-19 NOTE — Progress Notes (Signed)
PT Cancellation Note  Patient Details Name: Mason Wilson. MRN: [id number] DOB: Jun 28, 1941   Cancelled Treatment:    Reason Eval/Treat Not Completed: Other (comment)(PT in room to initiate PT evaluation, transport entered to take pt to procedure. PT will follow up as able. PLOF information in chart.)   Lieutenant Diego PT, DPT 10:54 AM,12/19/18 [phone]

## 2018-12-19 NOTE — Procedures (Signed)
Pre Procedural Dx: Symptomatic Ascites Post Procedural Dx: Same  Successful US guided paracentesis yielding 3 L of serous ascitic fluid.  EBL: None  Complications: None immediate  Jay Nanci Lakatos, MD Pager #: [phone]   

## 2018-12-19 NOTE — Progress Notes (Signed)
[facility] [place] NAME: Mason Wilson    MR#:  [id number]  DATE OF BIRTH:  Jan 24, 1941  SUBJECTIVE:   Renal function is stable, hemoglobin improved posttransfusion.  Still has significant anasarca.  Patient has a rash in his inguinal areas from leaking around the Foley catheter.  Abdomen still quite distended.  REVIEW OF SYSTEMS:    Review of Systems  Constitutional: Negative for chills and fever.  HENT: Negative for congestion and tinnitus.   Eyes: Negative for blurred vision and double vision.  Respiratory: Negative for cough, shortness of breath and wheezing.   Cardiovascular: Positive for leg swelling. Negative for chest pain, orthopnea and PND.  Gastrointestinal: Negative for abdominal pain, diarrhea, nausea and vomiting.       Abdominal distension.   Genitourinary: Negative for dysuria and hematuria.  Neurological: Negative for dizziness, sensory change and focal weakness.  All other systems reviewed and are negative.   Nutrition: 2 gm sodium.  Tolerating Diet: Yes Tolerating PT: Await Eval.   DRUG ALLERGIES:   Allergies  Allergen Reactions  . Altace [Ramipril] Cough  . Ciprofloxacin Other (See Comments)    Contraindicated in patients with myasthenia gravis     VITALS:  Blood pressure (!) 144/77, pulse (!) 102, temperature 98.9 F (37.2 C), temperature source Oral, resp. rate 19, height 5\' 9"  (1.753 m), weight 112.6 kg, SpO2 95 %.  PHYSICAL EXAMINATION:   Physical Exam  GENERAL:  78 y.o.-year-old obese patient lying in bed in no acute distress.  EYES: Pupils equal, round, reactive to light and accommodation. No scleral icterus. Extraocular muscles intact.  HEENT: Head atraumatic, normocephalic. Oropharynx and nasopharynx clear.  NECK:  Supple, no jugular venous distention. No thyroid enlargement, no tenderness.  LUNGS: Normal breath sounds bilaterally, no wheezing, rales, rhonchi. No use of accessory muscles of  respiration.  CARDIOVASCULAR: S1, S2 normal. No murmurs, rubs, or gallops.  ABDOMEN: Soft, nontender, distended, . Bowel sounds present. No organomegaly or mass.  EXTREMITIES: No cyanosis, clubbing, + 2 edema b/l.    NEUROLOGIC: Cranial nerves II through XII are intact. No focal Motor or sensory deficits b/l.  Globally weak.  PSYCHIATRIC: The patient is alert and oriented x 3.  SKIN: Fungal rash in the inguinal area, No lesion, or ulcer.   GU- scrotal/penile edema with foley cath in place with clear yellow urine draining.   LABORATORY PANEL:   CBC Recent Labs  Lab 12/19/18 0528  WBC 3.1*  HGB 8.6*  HCT 27.4*  PLT 74*   ------------------------------------------------------------------------------------------------------------------  Chemistries  Recent Labs  Lab 12/17/18 0317  12/19/18 0528  NA 144   < > 140  K 4.8   < > 3.9  CL 113*   < > 111  CO2 27   < > 25  GLUCOSE 166*   < > 150*  BUN 38*   < > 20  CREATININE 1.52*   < > 1.23  CALCIUM 8.5*   < > 8.3*  AST 24  --   --   ALT 17  --   --   ALKPHOS 99  --   --   BILITOT 1.4*  --   --    < > = values in this interval not displayed.   ------------------------------------------------------------------------------------------------------------------  Cardiac Enzymes No results for input(s): TROPONINI in the last 168 hours. ------------------------------------------------------------------------------------------------------------------  RADIOLOGY:  US Paracentesis  Result Date: 12/19/2018 INDICATION: Recurrent symptomatic ascites. Please perform ultrasound-guided paracentesis for therapeutic purposes.  EXAM: ULTRASOUND-GUIDED PARACENTESIS COMPARISON:  Ultrasound-guided paracentesis 12/17/2018 (yielding 4.8 L of peritoneal fluid). CT abdomen pelvis-12/15/2018; 11/26/2018 MEDICATIONS: None. COMPLICATIONS: None immediate. TECHNIQUE: Informed written consent was obtained from the patient after a discussion of the risks,  benefits and alternatives to treatment. A timeout was performed prior to the initiation of the procedure. Initial ultrasound scanning demonstrates a moderate amount of ascites within the right lower abdominal quadrant. The right lower abdomen was prepped and draped in the usual sterile fashion. 1% lidocaine with epinephrine was used for local anesthesia. An ultrasound image was saved for documentation purposed. An 8 Fr Safe-T-Centesis catheter was introduced. The paracentesis was performed. The catheter was removed and a dressing was applied. The patient tolerated the procedure well without immediate post procedural complication. FINDINGS: A total of approximately 3 liters of serous fluid was removed. IMPRESSION: Successful ultrasound-guided paracentesis yielding 3 liters of peritoneal fluid. Electronically Signed   By: Sandi Mariscal M.D.   On: 12/19/2018 12:08   US Paracentesis  Result Date: 12/17/2018 INDICATION: Patient with history of cirrhosis, now with ascites. Request is made for diagnostic and therapeutic paracentesis. EXAM: ULTRASOUND GUIDED DIAGNOSTIC AND THERAPEUTIC PARACENTESIS MEDICATIONS: 10 mL 1% lidocaine COMPLICATIONS: None immediate. PROCEDURE: Informed written consent was obtained from the patient after a discussion of the risks, benefits and alternatives to treatment. A timeout was performed prior to the initiation of the procedure. Initial ultrasound scanning demonstrates a moderate amount of ascites within the right lower abdominal quadrant. The right lower abdomen was prepped and draped in the usual sterile fashion. 1% lidocaine was used for local anesthesia. Following this, a Safe-T-Centesis catheter was introduced. An ultrasound image was saved for documentation purposes. The paracentesis was performed. The catheter was removed and a dressing was applied. The patient tolerated the procedure well without immediate post procedural complication. FINDINGS: A total of approximately 4.8 liters of  yellow fluid was removed. Samples were sent to the laboratory as requested by the clinical team. IMPRESSION: Successful ultrasound-guided diagnostic and therapeutic paracentesis yielding 4.8 liters of peritoneal fluid. Read by: Brynda Greathouse PA-C Electronically Signed   By: Corrie Mckusick D.O.   On: 12/17/2018 14:53     ASSESSMENT AND PLAN:   78 year old male with past medical history of myasthenia gravis, hypertension, hyperlipidemia, GERD, diabetes, nonalcoholic liver cirrhosis who presented to the hospital due to abdominal distention, worsening lower extremity edema noted to be in acute kidney injury.  1.  Acute renal failure- multifactorial in nature related to suspected urinary retention status post Foley, relative hypotension and blood loss from rectal bleeding. Much improved with blood transfusions, diuretics and some gentle IV fluids. - Also status post cystoscopy with TURP Foley catheter change 2 days ago.  Renal function at baseline now.  We will continue to follow BUN/creatinine.  2.  Urinary retention/chronic indwelling Foley-patient was having a complication as he was leaking around his Foley catheter site. -Seen by urology and status post TURP with replacement of his Foley catheter POD # 2.   -Discussed with urology today and they recommend removing Foley catheter tomorrow in the morning and follow response.    3.  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   -Status post ultrasound-guided paracentesis yesterday with 4.5 L of fluid removed 2 days ago and pt. Still having significant ascites and had paracentesis again today with 3 L removed. -Continue albumin. - Also started on increasing doses of Lasix and Aldactone.  4.  Anemia/thrombocytopenia-secondary to chronic liver disease.  -Patient transfused 1 unit of packed  red blood cells yesterday.  Hemoglobin improved posttransfusion.  We will continue to monitor.  5.  History of myasthenia gravis-continue  Pyridostigmine  6.  History of recent prostate abscess with bacteremia- continue oral cefdinir. - stable.    7.  Diabetes type 2 without complication-continue sliding scale insulin. BS stable.   8.  BPH-continue Flomax. - no urinary retention s/p TURP and replacement of foley 2 days ago. - plan on possibly removing foley in a.m. tomorrow.    9.  Hyperlipidemia-continue simvastatin.  10. Inguinal Fungal Rash - started on Nystatin Powder   Await PT eval.   All the records are reviewed and case discussed with Care Management/Social Worker. Management plans discussed with the patient, family and they are in agreement.  CODE STATUS: DNR  DVT Prophylaxis: Ted's & SCD's  TOTAL TIME TAKING CARE OF THIS PATIENT: 30 minutes.   POSSIBLE D/C IN 1-2 DAYS, DEPENDING ON CLINICAL CONDITION.   Henreitta Leber M.D on 12/19/2018 at 12:56 PM  Between 7am to 6pm - Pager - [phone]  After 6pm go to [email or web address] - Proofreader  [facility]  Office  [phone]  CC: Primary care physician; [name], MD

## 2018-12-19 NOTE — Progress Notes (Signed)
[facility] Inpatient Progress Note  Subjective: Patient seen for f/u ascites, end stage cirrhosis, acute renal failure improving. Has had over 5 liters over 2 sessions of paracentesis. Still swollen, but breathing and comfort level greatly improved. Patient's wife had asked to see Dr. Raisanen Norris of GI but I had previously followed patient and wife is not present at this time.    Objective: Vital signs in last 24 hours: Temp:  [98.3 F (36.8 C)-99 F (37.2 C)] 98.9 F (37.2 C) (02/11 0759) Pulse Rate:  [81-102] 102 (02/11 1107) Resp:  [19-20] 19 (02/11 0759) BP: (137-149)/(66-77) 144/77 (02/11 1107) SpO2:  [93 %-97 %] 95 % (02/11 1107) Blood pressure (!) 144/77, pulse (!) 102, temperature 98.9 F (37.2 C), temperature source Oral, resp. rate 19, height 5\' 9"  (1.753 m), weight 112.6 kg, SpO2 95 %.    Intake/Output from previous day: 02/10 0701 - 02/11 0700 In: 4644 [P.O.:1320; Blood:324] Out: 5000 [Urine:5000]  Intake/Output this shift: Total I/O In: 360 [P.O.:360] Out: 450 [Urine:450]   General appearance:  Alert, NAD Resp: Few rales, no wheezes Cardio:  Reg rate Normal S1, S2 GI:  Distended with fluid wave, nt. BS+ Extremities:  Persistent 4+ edema.   Lab Results: Results for orders placed or performed during the hospital encounter of 12/15/18 (from the past 24 hour(s))  Glucose, capillary     Status: Abnormal   Collection Time: 12/18/18  4:44 PM  Result Value Ref Range   Glucose-Capillary 167 (H) 70 - 99 mg/dL  Glucose, capillary     Status: Abnormal   Collection Time: 12/18/18  8:45 PM  Result Value Ref Range   Glucose-Capillary 184 (H) 70 - 99 mg/dL   Comment 1 Notify RN    Comment 2 Document in Chart   Basic metabolic panel     Status: Abnormal   Collection Time: 12/19/18  5:28 AM  Result Value Ref Range   Sodium 140 135 - 145 mmol/L   Potassium 3.9 3.5 - 5.1 mmol/L   Chloride 111 98 - 111 mmol/L   CO2 25 22 - 32 mmol/L   Glucose, Bld 150  (H) 70 - 99 mg/dL   BUN 20 8 - 23 mg/dL   Creatinine, Ser 1.23 0.61 - 1.24 mg/dL   Calcium 8.3 (L) 8.9 - 10.3 mg/dL   GFR calc non Af Amer 57 (L) >60 mL/min   GFR calc Af Amer >60 >60 mL/min   Anion gap 4 (L) 5 - 15  CBC     Status: Abnormal   Collection Time: 12/19/18  5:28 AM  Result Value Ref Range   WBC 3.1 (L) 4.0 - 10.5 K/uL   RBC 2.85 (L) 4.22 - 5.81 MIL/uL   Hemoglobin 8.6 (L) 13.0 - 17.0 g/dL   HCT 27.4 (L) 39.0 - 52.0 %   MCV 96.1 80.0 - 100.0 fL   MCH 30.2 26.0 - 34.0 pg   MCHC 31.4 30.0 - 36.0 g/dL   RDW 17.6 (H) 11.5 - 15.5 %   Platelets 74 (L) 150 - 400 K/uL   nRBC 0.0 0.0 - 0.2 %  Glucose, capillary     Status: Abnormal   Collection Time: 12/19/18  8:01 AM  Result Value Ref Range   Glucose-Capillary 136 (H) 70 - 99 mg/dL   Comment 1 Notify RN    Comment 2 Document in Chart      Recent Labs    12/17/18 0317 12/17/18 1908 12/18/18 0408 12/19/18 0528  WBC 2.9*  --  2.3* 3.1*  HGB 7.3* 8.4* 7.1* 8.6*  HCT 24.4* 27.2* 22.6* 27.4*  PLT 79*  --  69* 74*   BMET Recent Labs    12/17/18 0317 12/18/18 0408 12/19/18 0528  NA 144 143 140  K 4.8 4.2 3.9  CL 113* 113* 111  CO2 27 25 25   GLUCOSE 166* 117* 150*  BUN 38* 26* 20  CREATININE 1.52* 1.10 1.23  CALCIUM 8.5* 8.2* 8.3*   LFT Recent Labs    12/17/18 0317  PROT 5.6*  ALBUMIN 2.7*  AST 24  ALT 17  ALKPHOS 99  BILITOT 1.4*   PT/INR No results for input(s): LABPROT, INR in the last 72 hours. Hepatitis Panel No results for input(s): HEPBSAG, HCVAB, HEPAIGM, HEPBIGM in the last 72 hours. C-Diff No results for input(s): CDIFFTOX in the last 72 hours. No results for input(s): CDIFFPCR in the last 72 hours.   Studies/Results: US Paracentesis  Result Date: 12/19/2018 INDICATION: Recurrent symptomatic ascites. Please perform ultrasound-guided paracentesis for therapeutic purposes. EXAM: ULTRASOUND-GUIDED PARACENTESIS COMPARISON:  Ultrasound-guided paracentesis 12/17/2018 (yielding 4.8 L of  peritoneal fluid). CT abdomen pelvis-12/15/2018; 11/26/2018 MEDICATIONS: None. COMPLICATIONS: None immediate. TECHNIQUE: Informed written consent was obtained from the patient after a discussion of the risks, benefits and alternatives to treatment. A timeout was performed prior to the initiation of the procedure. Initial ultrasound scanning demonstrates a moderate amount of ascites within the right lower abdominal quadrant. The right lower abdomen was prepped and draped in the usual sterile fashion. 1% lidocaine with epinephrine was used for local anesthesia. An ultrasound image was saved for documentation purposed. An 8 Fr Safe-T-Centesis catheter was introduced. The paracentesis was performed. The catheter was removed and a dressing was applied. The patient tolerated the procedure well without immediate post procedural complication. FINDINGS: A total of approximately 3 liters of serous fluid was removed. IMPRESSION: Successful ultrasound-guided paracentesis yielding 3 liters of peritoneal fluid. Electronically Signed   By: Sandi Mariscal M.D.   On: 12/19/2018 12:08    Scheduled Inpatient Medications:   . cefdinir  300 mg Oral Q12H  . furosemide  80 mg Oral Daily  . insulin aspart  0-5 Units Subcutaneous QHS  . insulin aspart  0-9 Units Subcutaneous TID WC  . multivitamin with minerals  1 tablet Oral Daily  . nystatin   Topical BID  . pantoprazole  40 mg Oral Daily  . phenazopyridine  100 mg Oral TID WC  . pyridostigmine  60 mg Oral QID  . simvastatin  40 mg Oral q1800  . spironolactone  100 mg Oral Daily  . tamsulosin  0.4 mg Oral Daily    Continuous Inpatient Infusions:    PRN Inpatient Medications:  docusate sodium, HYDROcodone-acetaminophen, ipratropium-albuterol, loperamide, opium-belladonna, traMADol    Assessment:  1. Anasarca - improving- Suggests some decompensation in liver disease possibly related to recent surgical stressors.  2. Acute renal insufficiency -per Dr. Rolly Salter,  improving 3. 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  4. Massive Ascites - secondary to worsening liver disease. Improved s/p paracentesis  Recommendations: 1. Continue diuretics, increase dose if Ok with Dr. Rolly Salter. 2. Maintain 2g Na diet. 3. Follow up with Dr. Oppenheimer Norris as outpatient in 2-3 weeks. 4. Patient had no additional questions. Call back if I can help.    Cowen Pesqueira K. Alice Reichert, M.D. 12/19/2018, 3:39 PM

## 2018-12-19 NOTE — Evaluation (Signed)
Physical Therapy Evaluation Patient Details Name: Mason Wilson. MRN: [id number] DOB: Mar 09, 1941 Today's Date: 12/19/2018   History of Present Illness  78 y.o. male with PMH of coronary artery disease, myasthenia gravis, chronic back pain, nonalcoholic cirrhosis, diabetes mellitus type 2, GERD, hyperlipidemia, hypertension, history of melanoma,  nephrolithiasis who was admitted to [facility] on 12/15/2018 for evaluation of rectal bleeding and acute renal failure s/p transurethral resection of prostate, cytoscopy on 12/17/2018. Pt has also had multiple paracentesis procedures during this stay.     Clinical Impression  Patient alert, oriented, able to answer all questions appropriately, no complaints of pain at rest. Patient reported living in 3rd story house with wife who is available 24/7 to assist if needed, previously independent with ADLs/IADLs, does not use AD at baseline.  Pt demonstrated bed mobility mod I sit <> stand transfers with supervision/CGA and heavy use of UE, and ambulated ~18ft without LOB, any unsteadiness noted, though did prefer to ambulate with at least one UE support from external support (hallway rails). Patient with some fatigue at end of session.  Overall the patient demonstrated mild deficits in balance, activity tolerance, and endurance and would benefit from skilled PT intervention to address these. Recommendation is HHPT.      Follow Up Recommendations Home health PT    Equipment Recommendations  None recommended by PT    Recommendations for Other Services       Precautions / Restrictions Precautions Precautions: Fall Restrictions Weight Bearing Restrictions: No      Mobility  Bed Mobility Overal bed mobility: Needs Assistance Bed Mobility: Supine to Sit     Supine to sit: Supervision        Transfers Overall transfer level: Needs assistance Equipment used: Rolling walker (2 wheeled) Transfers: Sit to/from Stand Sit to Stand: Min  guard;Supervision         General transfer comment: heavy use of UE, but overall steady  Ambulation/Gait Ambulation/Gait assistance: Min guard Gait Distance (Feet): 180 Feet Assistive device: None Gait Pattern/deviations: Step-through pattern;Decreased step length - right;Decreased step length - left     General Gait Details: Pt tends to reach for external support, but does not require UE support  Stairs            Wheelchair Mobility    Modified Rankin (Stroke Patients Only)       Balance Overall balance assessment: Mild deficits observed, not formally tested   Sitting balance-Leahy Scale: Normal                                       Pertinent Vitals/Pain Pain Assessment: No/denies pain Pain Location: no pain at rest Pain Intervention(s): Monitored during session;Repositioned    Home Living                        Prior Function                 Hand Dominance        Extremity/Trunk Assessment   Upper Extremity Assessment Upper Extremity Assessment: Generalized weakness    Lower Extremity Assessment Lower Extremity Assessment: Generalized weakness       Communication      Cognition Arousal/Alertness: Awake/alert Behavior During Therapy: WFL for tasks assessed/performed Overall Cognitive Status: Within Functional Limits for tasks assessed  General Comments      Exercises     Assessment/Plan    PT Assessment Patient needs continued PT services  PT Problem List Decreased strength;Decreased activity tolerance;Decreased balance;Decreased mobility       PT Treatment Interventions Gait training;Stair training;Functional mobility training;Therapeutic activities;Therapeutic exercise;Balance training;Patient/family education;Neuromuscular re-education    PT Goals (Current goals can be found in the Care Plan section)  Acute Rehab PT Goals Patient Stated  Goal: to go home PT Goal Formulation: With patient Time For Goal Achievement: 01/02/19 Potential to Achieve Goals: Good    Frequency Min 2X/week   Barriers to discharge        Co-evaluation               AM-PAC PT "6 Clicks" Mobility  Outcome Measure Help needed turning from your back to your side while in a flat bed without using bedrails?: A Little Help needed moving from lying on your back to sitting on the side of a flat bed without using bedrails?: A Little Help needed moving to and from a bed to a chair (including a wheelchair)?: A Little Help needed standing up from a chair using your arms (e.g., wheelchair or bedside chair)?: A Little Help needed to walk in hospital room?: A Little Help needed climbing 3-5 steps with a railing? : A Little 6 Click Score: 18    End of Session Equipment Utilized During Treatment: Gait belt Activity Tolerance: Patient tolerated treatment well Patient left: in chair;with call bell/phone within reach;with chair alarm set Nurse Communication: Mobility status PT Visit Diagnosis: Muscle weakness (generalized) (M62.81);Difficulty in walking, not elsewhere classified (R26.2)    Time: 7048-8891 PT Time Calculation (min) (ACUTE ONLY): 18 min   Charges:   PT Evaluation $PT Eval Moderate Complexity: 1 Mod         Lieutenant Diego PT, DPT 3:21 PM,12/19/18 [phone]

## 2018-12-19 NOTE — Progress Notes (Signed)
[facility]  ROUNDING NOTE   Subjective:   Patient is doing well. UOP 5 liters  Diuretics restarted.    Objective:  Vital signs in last 24 hours:  Temp:  [98.3 F (36.8 C)-99 F (37.2 C)] 98.9 F (37.2 C) (02/11 0759) Pulse Rate:  [81-102] 102 (02/11 1107) Resp:  [18-20] 19 (02/11 0759) BP: (130-149)/(63-77) 144/77 (02/11 1107) SpO2:  [93 %-97 %] 95 % (02/11 1107)  Weight change:  Filed Weights   12/15/18 2026 12/16/18 0431 12/18/18 0502  Weight: 116 kg 114.3 kg 112.6 kg    Intake/Output: I/O last 3 completed shifts: In: 09323 [P.O.:1320; Blood:324; Other:12000] Out: 55732 [Urine:14650]   Intake/Output this shift:  Total I/O In: 360 [P.O.:360] Out: 450 [Urine:450]  Physical Exam: General: No acute distress  Head: Normocephalic, atraumatic. Moist oral mucosal membranes  Eyes: Anicteric  Neck: Supple, trachea midline  Lungs:  Clear to auscultation, normal effort  Heart: S1S2 no rubs  Abdomen:  +distended  Extremities: trace peripheral edema.  Neurologic: Awake, alert, following commands  Skin: No lesions  GU:  Foley in place    Basic Metabolic Panel: Recent Labs  Lab 12/15/18 1202 12/16/18 0405 12/17/18 0317 12/18/18 0408 12/19/18 0528  NA 143 143 144 143 140  K 4.1 4.6 4.8 4.2 3.9  CL 111 112* 113* 113* 111  CO2 24 26 27 25 25   GLUCOSE 116* 112* 166* 117* 150*  BUN 47* 43* 38* 26* 20  CREATININE 2.04* 1.86* 1.52* 1.10 1.23  CALCIUM 8.9 8.4* 8.5* 8.2* 8.3*    Liver Function Tests: Recent Labs  Lab 12/15/18 1202 12/17/18 0317  AST 30 24  ALT 21 17  ALKPHOS 102 99  BILITOT 1.4* 1.4*  PROT 6.2* 5.6*  ALBUMIN 3.0* 2.7*   No results for input(s): LIPASE, AMYLASE in the last 168 hours. No results for input(s): AMMONIA in the last 168 hours.  CBC: Recent Labs  Lab 12/15/18 1202 12/16/18 0405 12/17/18 0317 12/17/18 1908 12/18/18 0408 12/19/18 0528  WBC 4.9 2.9* 2.9*  --  2.3* 3.1*  HGB 8.9* 7.1* 7.3* 8.4* 7.1* 8.6*   HCT 28.9* 23.5* 24.4* 27.2* 22.6* 27.4*  MCV 100.0 100.4* 101.7*  --  100.0 96.1  PLT 115* 80* 79*  --  69* 74*    Cardiac Enzymes: No results for input(s): CKTOTAL, CKMB, CKMBINDEX, TROPONINI in the last 168 hours.  BNP: Invalid input(s): POCBNP  CBG: Recent Labs  Lab 12/18/18 0751 12/18/18 1152 12/18/18 1644 12/18/18 2045 12/19/18 0801  GLUCAP 108* 196* 167* 184* 136*    Microbiology: Results for orders placed or performed during the hospital encounter of 12/15/18  Urine culture     Status: None   Collection Time: 12/15/18  2:48 PM  Result Value Ref Range Status   Specimen Description URINE, RANDOM  Final   Special Requests   Final    NONE Performed at [facility], [address] [place], [place] [address]    Culture NO GROWTH  Final   Report Status 12/16/2018 FINAL  Final  C difficile quick scan w PCR reflex     Status: None   Collection Time: 12/15/18  6:10 PM  Result Value Ref Range Status   C Diff antigen NEGATIVE NEGATIVE Final   C Diff toxin NEGATIVE NEGATIVE Final   C Diff interpretation No C. difficile detected.  Final    Comment: Performed at [facility], [address] [place], [place] [address]  Body fluid culture  Status: None (Preliminary result)   Collection Time: 12/17/18  1:35 PM  Result Value Ref Range Status   Specimen Description PERITONEAL FLUID  Final   Special Requests   Final    NONE Performed at [facility], [address] [place], [place] [address]    Gram Stain   Final    WBC PRESENT,BOTH PMN AND MONONUCLEAR NO ORGANISMS SEEN CYTOSPIN SMEAR    Culture   Final    NO GROWTH 1 DAY Performed at [facility], [place] [address], [place], [place] [address]    Report Status PENDING  Incomplete    Coagulation Studies: No results for input(s): LABPROT, INR in the last 72 hours.  Urinalysis: No results for input(s): COLORURINE, LABSPEC, PHURINE, GLUCOSEU, HGBUR, BILIRUBINUR, KETONESUR,  PROTEINUR, UROBILINOGEN, NITRITE, LEUKOCYTESUR in the last 72 hours.  Invalid input(s): APPERANCEUR    Imaging: US Paracentesis  Result Date: 12/19/2018 INDICATION: Recurrent symptomatic ascites. Please perform ultrasound-guided paracentesis for therapeutic purposes. EXAM: ULTRASOUND-GUIDED PARACENTESIS COMPARISON:  Ultrasound-guided paracentesis 12/17/2018 (yielding 4.8 L of peritoneal fluid). CT abdomen pelvis-12/15/2018; 11/26/2018 MEDICATIONS: None. COMPLICATIONS: None immediate. TECHNIQUE: Informed written consent was obtained from the patient after a discussion of the risks, benefits and alternatives to treatment. A timeout was performed prior to the initiation of the procedure. Initial ultrasound scanning demonstrates a moderate amount of ascites within the right lower abdominal quadrant. The right lower abdomen was prepped and draped in the usual sterile fashion. 1% lidocaine with epinephrine was used for local anesthesia. An ultrasound image was saved for documentation purposed. An 8 Fr Safe-T-Centesis catheter was introduced. The paracentesis was performed. The catheter was removed and a dressing was applied. The patient tolerated the procedure well without immediate post procedural complication. FINDINGS: A total of approximately 3 liters of serous fluid was removed. IMPRESSION: Successful ultrasound-guided paracentesis yielding 3 liters of peritoneal fluid. Electronically Signed   By: Sandi Mariscal M.D.   On: 12/19/2018 12:08   US Paracentesis  Result Date: 12/17/2018 INDICATION: Patient with history of cirrhosis, now with ascites. Request is made for diagnostic and therapeutic paracentesis. EXAM: ULTRASOUND GUIDED DIAGNOSTIC AND THERAPEUTIC PARACENTESIS MEDICATIONS: 10 mL 1% lidocaine COMPLICATIONS: None immediate. PROCEDURE: Informed written consent was obtained from the patient after a discussion of the risks, benefits and alternatives to treatment. A timeout was performed prior to the  initiation of the procedure. Initial ultrasound scanning demonstrates a moderate amount of ascites within the right lower abdominal quadrant. The right lower abdomen was prepped and draped in the usual sterile fashion. 1% lidocaine was used for local anesthesia. Following this, a Safe-T-Centesis catheter was introduced. An ultrasound image was saved for documentation purposes. The paracentesis was performed. The catheter was removed and a dressing was applied. The patient tolerated the procedure well without immediate post procedural complication. FINDINGS: A total of approximately 4.8 liters of yellow fluid was removed. Samples were sent to the laboratory as requested by the clinical team. IMPRESSION: Successful ultrasound-guided diagnostic and therapeutic paracentesis yielding 4.8 liters of peritoneal fluid. Read by: Brynda Greathouse PA-C Electronically Signed   By: Corrie Mckusick D.O.   On: 12/17/2018 14:53     Medications:    . cefdinir  300 mg Oral Q12H  . furosemide  80 mg Oral Daily  . insulin aspart  0-5 Units Subcutaneous QHS  . insulin aspart  0-9 Units Subcutaneous TID WC  . multivitamin with minerals  1 tablet Oral Daily  . nystatin   Topical BID  . pantoprazole  40 mg Oral Daily  . phenazopyridine  100 mg Oral TID WC  . pyridostigmine  60 mg Oral QID  . simvastatin  40 mg Oral q1800  . spironolactone  100 mg Oral Daily  . tamsulosin  0.4 mg Oral Daily   docusate sodium, HYDROcodone-acetaminophen, ipratropium-albuterol, loperamide, opium-belladonna, traMADol  Assessment/ Plan:   Mr. Berton Butrick. is a 78 y.o. white male with coronary artery disease, myasthenia gravis, chronic back pain, nonalcoholic cirrhosis, diabetes mellitus type 2, GERD, hyperlipidemia, hypertension, history of melanoma, nephrolithiasis who was admitted to [facility] on 12/15/2018 for evaluation of rectal bleeding and acute renal failure.  1.  Acute renal failure: secondary to obstructive uropathy.  Now with  nonoliguric urine output with post obstructive diuresis Creatinine back to baseline Monitor volume status and serum electrolytes.   2.  Anemia with renal failure and GI bleed. PRBC transfusion on 2/10. Hemoglobin 8.6  3.  Cirrhosis of the liver with ascites. Status post large volume paracentesis on 2/9.  - spironolactone and furosemide.  - ultrasound guided paracentesis with albumin.    LOS: [place] Ryen Heitmeyer 2/11/20201:10 PM

## 2018-12-19 NOTE — Progress Notes (Signed)
   Subjective Mason Wilson, foley draining well Having pain at base of scrotum  Physical Exam: BP (!) 149/70 (BP Location: Right Arm)   Pulse 99   Temp 98.9 F (37.2 C) (Oral)   Resp 19   Ht 5\' 9"  (1.753 m)   Wt 112.6 kg   SpO2 95%   BMI 36.66 kg/m    Constitutional:  Alert and oriented, No acute distress. Respiratory: Normal respiratory effort, no increased work of breathing. GU: Significant penile and scrotal edema, erythema and rash in groin folds. No induration or abscess palpable. 87F 3-way with amber urine  Laboratory Data: Reviewed  Pertinent Imaging: None to review  Assessment & Plan:   Mason Wilson is a 78 year old male with complex history.  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  He then underwent emergent transurethral resection of these lesions for drainage with Dr. Jeffie Pollock and catheter placement.  Urine cultures at that time grew both E. coli and Klebsiella.  He has been on culture appropriate Cefdinir.   He represented to urology clinic Friday 2/7 with possible urinary retention with small voids and elevated bladder scan.  He was evaluated in the emergency room which showed severe cirrhosis and ascites.  I strongly suspect the patient was never truly in urinary retention, but was having falsely elevated PVRs secondary to his ascites.  He has had multiple catheters replaced over the weekend, and ultimately underwent a TURP with Dr. [name] on 12/17/2018.   CBI capped, maintain Foley to drainage Do not remove Foley catheter without discussing with urology, likely remove foley tomorrow early morning Continue cefdinir Transfusion per hospitalist Call if questions  Billey Co, MD

## 2018-12-20 LAB — GLUCOSE, CAPILLARY: Glucose-Capillary: 144 mg/dL — ABNORMAL HIGH (ref 70–99)

## 2018-12-20 LAB — CBC
HCT: 26.4 % — ABNORMAL LOW (ref 39.0–52.0)
Hemoglobin: 8.5 g/dL — ABNORMAL LOW (ref 13.0–17.0)
MCH: 30.7 pg (ref 26.0–34.0)
MCHC: 32.2 g/dL (ref 30.0–36.0)
MCV: 95.3 fL (ref 80.0–100.0)
Platelets: 71 10*3/uL — ABNORMAL LOW (ref 150–400)
RBC: 2.77 MIL/uL — AB (ref 4.22–5.81)
RDW: 17.1 % — ABNORMAL HIGH (ref 11.5–15.5)
WBC: 3.1 10*3/uL — ABNORMAL LOW (ref 4.0–10.5)
nRBC: 0 % (ref 0.0–0.2)

## 2018-12-20 LAB — BASIC METABOLIC PANEL
Anion gap: 5 (ref 5–15)
BUN: 18 mg/dL (ref 8–23)
CO2: 26 mmol/L (ref 22–32)
Calcium: 8.2 mg/dL — ABNORMAL LOW (ref 8.9–10.3)
Chloride: 109 mmol/L (ref 98–111)
Creatinine, Ser: 1.32 mg/dL — ABNORMAL HIGH (ref 0.61–1.24)
GFR calc non Af Amer: 52 mL/min — ABNORMAL LOW (ref >60)
Glucose, Bld: 141 mg/dL — ABNORMAL HIGH (ref 70–99)
Potassium: 3.8 mmol/L (ref 3.5–5.1)
Sodium: 140 mmol/L (ref 135–145)

## 2018-12-20 MED ORDER — SPIRONOLACTONE 100 MG PO TABS: 100.0000 mg | ORAL_TABLET | Freq: Every day | ORAL | 1 refills | Status: AC

## 2018-12-20 MED ORDER — NYSTATIN 100000 UNIT/GM EX POWD: Freq: Two times a day (BID) | CUTANEOUS | 0 refills | Status: AC

## 2018-12-20 MED ORDER — FUROSEMIDE 80 MG PO TABS: 80.0000 mg | ORAL_TABLET | Freq: Every day | ORAL | 1 refills | Status: AC

## 2018-12-20 NOTE — Care Management Note (Signed)
Case Management Note  Patient Details  Name: Mason Wilson. MRN: [id number] Date of Birth: 02/17/1941  Subjective/Objective:     Discharging to home today with wife.  He is independent in all ADL's.  Current with his PCP.  Obtains medications at [facility] in [place].  Denies difficulties obtaining medications or with accessing healthcare.  Offered HH services as PT has recommended [facility].  He is declining any services at home.  No further needs identified at this time.  Wife will transport patient home.               Action/Plan:   Expected Discharge Date:  12/20/18               Expected Discharge Plan:  Home/Self Care  In-House Referral:     Discharge planning Services  CM Consult  Post Acute Care Choice:    Choice offered to:     DME Arranged:    DME Agency:     HH Arranged:    HH Agency:     Status of Service:  Completed, signed off  If discussed at [employer] of Stay Meetings, dates discussed:    Additional Comments:  Elza Rafter, RN 12/20/2018, 10:49 AM

## 2018-12-20 NOTE — Progress Notes (Signed)
   Subjective Afebrile, NAE Penile and scrotal edema improving  Physical Exam: BP (!) 191/82 (BP Location: Right Arm)   Pulse 97   Temp 98.5 F (36.9 C)   Resp 17   Ht 5\' 9"  (1.753 m)   Wt 108.6 kg   SpO2 97%   BMI 35.37 kg/m    Constitutional:  Alert and oriented, No acute distress. Respiratory: Normal respiratory effort, no increased work of breathing. GI: Abdomen is soft, non-tender, non-distended GU: Penile and scrotal edema significantly improved Drains: 35F 3-way with clear yellow urine  Laboratory Data: Reviewed   Assessment & Plan:   Mason Wilson is a 78 year old male with complex history. 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 He then underwent emergent transurethral resection of these lesions for drainage with Dr. Jeffie Pollock and catheter placement. Urine cultures at that time grew both E. coli and Klebsiella. He has been on culture appropriate Cefdinir.  He represented to urology clinic Friday 2/7 with possible urinary retention with small voids and elevated bladder scan. He was evaluated in the emergency room which showed severe cirrhosis and ascites.I strongly suspect the patient was never truly in urinary retention, but was having falselyelevated PVRssecondary to his ascites. He has had multiple catheters replaced over the weekend, and ultimately underwent a TURP with Dr. [name] on 12/17/2018.   Foley removed this morning Do not bladder scan (will be inaccurate in setting of ascites). Do not straight catheterize or replace foley without discussing with urology Continue cefdinir for history of prostate abscess/bacteremia Call if questions  Mason Co, MD

## 2018-12-20 NOTE — Progress Notes (Signed)
[facility]  ROUNDING NOTE   Subjective:   Wife at bedside.   UOP 1400  Creatinine 1.32 (1.23)  Paracentesis yesterday, 3 Liters removed.   Objective:  Vital signs in last 24 hours:  Temp:  [98.4 F (36.9 C)-98.7 F (37.1 C)] 98.5 F (36.9 C) (02/12 0833) Pulse Rate:  [87-113] 102 (02/12 0833) Resp:  [17-20] 20 (02/12 0833) BP: (128-191)/(66-82) 128/73 (02/12 0833) SpO2:  [96 %-98 %] 96 % (02/12 0833) Weight:  [108.6 kg] 108.6 kg (02/12 0340)  Weight change:  Filed Weights   12/16/18 0431 12/18/18 0502 12/20/18 0340  Weight: 114.3 kg 112.6 kg 108.6 kg    Intake/Output: I/O last 3 completed shifts: In: 720 [P.O.:720] Out: 2050 [Urine:2050]   Intake/Output this shift:  No intake/output data recorded.  Physical Exam: General: No acute distress  Head: Normocephalic, atraumatic. Moist oral mucosal membranes  Eyes: Anicteric  Neck: Supple, trachea midline  Lungs:  Clear to auscultation, normal effort  Heart: S1S2 no rubs  Abdomen:  +distended  Extremities: trace peripheral edema.  Neurologic: Awake, alert, following commands  Skin: No lesions        Basic Metabolic Panel: Recent Labs  Lab 12/16/18 0405 12/17/18 0317 12/18/18 0408 12/19/18 0528 12/20/18 0332  NA 143 144 143 140 140  K 4.6 4.8 4.2 3.9 3.8  CL 112* 113* 113* 111 109  CO2 26 27 25 25 26   GLUCOSE 112* 166* 117* 150* 141*  BUN 43* 38* 26* 20 18  CREATININE 1.86* 1.52* 1.10 1.23 1.32*  CALCIUM 8.4* 8.5* 8.2* 8.3* 8.2*    Liver Function Tests: Recent Labs  Lab 12/15/18 1202 12/17/18 0317  AST 30 24  ALT 21 17  ALKPHOS 102 99  BILITOT 1.4* 1.4*  PROT 6.2* 5.6*  ALBUMIN 3.0* 2.7*   No results for input(s): LIPASE, AMYLASE in the last 168 hours. No results for input(s): AMMONIA in the last 168 hours.  CBC: Recent Labs  Lab 12/16/18 0405 12/17/18 0317 12/17/18 1908 12/18/18 0408 12/19/18 0528 12/20/18 0332  WBC 2.9* 2.9*  --  2.3* 3.1* 3.1*  HGB 7.1* 7.3*  8.4* 7.1* 8.6* 8.5*  HCT 23.5* 24.4* 27.2* 22.6* 27.4* 26.4*  MCV 100.4* 101.7*  --  100.0 96.1 95.3  PLT 80* 79*  --  69* 74* 71*    Cardiac Enzymes: No results for input(s): CKTOTAL, CKMB, CKMBINDEX, TROPONINI in the last 168 hours.  BNP: Invalid input(s): POCBNP  CBG: Recent Labs  Lab 12/18/18 2045 12/19/18 0801 12/19/18 1623 12/19/18 2102 12/20/18 0752  GLUCAP 184* 136* 186* 202* 144*    Microbiology: Results for orders placed or performed during the hospital encounter of 12/15/18  Urine culture     Status: None   Collection Time: 12/15/18  2:48 PM  Result Value Ref Range Status   Specimen Description URINE, RANDOM  Final   Special Requests   Final    NONE Performed at [facility], [address] [place], [place] [address]    Culture NO GROWTH  Final   Report Status 12/16/2018 FINAL  Final  C difficile quick scan w PCR reflex     Status: None   Collection Time: 12/15/18  6:10 PM  Result Value Ref Range Status   C Diff antigen NEGATIVE NEGATIVE Final   C Diff toxin NEGATIVE NEGATIVE Final   C Diff interpretation No C. difficile detected.  Final    Comment: Performed at [facility], [address] [place], [place] [address]  Body fluid culture     Status: None (Preliminary result)   Collection Time: 12/17/18  1:35 PM  Result Value Ref Range Status   Specimen Description PERITONEAL FLUID  Final   Special Requests   Final    NONE Performed at [facility], [address] [place], [place] [address]    Gram Stain   Final    WBC PRESENT,BOTH PMN AND MONONUCLEAR NO ORGANISMS SEEN CYTOSPIN SMEAR    Culture   Final    NO GROWTH 2 DAYS Performed at [facility], [place] [address] [place], [place] [address]    Report Status PENDING  Incomplete    Coagulation Studies: No results for input(s): LABPROT, INR in the last 72 hours.  Urinalysis: No results for input(s): COLORURINE, LABSPEC, PHURINE, GLUCOSEU, HGBUR,  BILIRUBINUR, KETONESUR, PROTEINUR, UROBILINOGEN, NITRITE, LEUKOCYTESUR in the last 72 hours.  Invalid input(s): APPERANCEUR    Imaging: US Paracentesis  Result Date: 12/19/2018 INDICATION: Recurrent symptomatic ascites. Please perform ultrasound-guided paracentesis for therapeutic purposes. EXAM: ULTRASOUND-GUIDED PARACENTESIS COMPARISON:  Ultrasound-guided paracentesis 12/17/2018 (yielding 4.8 L of peritoneal fluid). CT abdomen pelvis-12/15/2018; 11/26/2018 MEDICATIONS: None. COMPLICATIONS: None immediate. TECHNIQUE: Informed written consent was obtained from the patient after a discussion of the risks, benefits and alternatives to treatment. A timeout was performed prior to the initiation of the procedure. Initial ultrasound scanning demonstrates a moderate amount of ascites within the right lower abdominal quadrant. The right lower abdomen was prepped and draped in the usual sterile fashion. 1% lidocaine with epinephrine was used for local anesthesia. An ultrasound image was saved for documentation purposed. An 8 Fr Safe-T-Centesis catheter was introduced. The paracentesis was performed. The catheter was removed and a dressing was applied. The patient tolerated the procedure well without immediate post procedural complication. FINDINGS: A total of approximately 3 liters of serous fluid was removed. IMPRESSION: Successful ultrasound-guided paracentesis yielding 3 liters of peritoneal fluid. Electronically Signed   By: Sandi Mariscal M.D.   On: 12/19/2018 12:08     Medications:    . cefdinir  300 mg Oral Q12H  . furosemide  80 mg Oral Daily  . insulin aspart  0-5 Units Subcutaneous QHS  . insulin aspart  0-9 Units Subcutaneous TID WC  . multivitamin with minerals  1 tablet Oral Daily  . nystatin   Topical BID  . pantoprazole  40 mg Oral Daily  . phenazopyridine  100 mg Oral TID WC  . pyridostigmine  60 mg Oral QID  . simvastatin  40 mg Oral q1800  . spironolactone  100 mg Oral Daily  .  tamsulosin  0.4 mg Oral Daily   docusate sodium, HYDROcodone-acetaminophen, ipratropium-albuterol, loperamide, opium-belladonna, traMADol  Assessment/ Plan:   Mason Wilson. is a 78 y.o. white male with coronary artery disease, myasthenia gravis, chronic back pain, nonalcoholic cirrhosis, diabetes mellitus type 2, GERD, hyperlipidemia, hypertension, history of melanoma, nephrolithiasis who was admitted to [facility] on 12/15/2018 for evaluation of rectal bleeding and acute renal failure.  1.  Acute renal failure: secondary to obstructive uropathy.  Now with nonoliguric urine output with post obstructive diuresis Creatinine baseline 0.9 11/30/2018 Monitor volume status and serum electrolytes.   2.  Anemia with renal failure and GI bleed. PRBC transfusion on 2/10. Hemoglobin 8.5  3.  Cirrhosis of the liver with ascites. Status post large volume paracentesis on 2/9.  - spironolactone and furosemide.     LOS: 5 Emilea Goga 2/12/202012:28 PM

## 2018-12-20 NOTE — Discharge Summary (Signed)
[facility] NAME: Mason Wilson    MR#:  [id number]  DATE OF BIRTH:  09-19-1941  DATE OF ADMISSION:  12/15/2018 ADMITTING PHYSICIAN: Vaughan Basta, MD  DATE OF DISCHARGE: 12/20/2018 12:15 PM  PRIMARY CARE PHYSICIAN: [name], MD    ADMISSION DIAGNOSIS:  Swelling [R60.9] Renal insufficiency [N28.9] Foley catheter status [Z96.0] Ascites [R18.8] Abdominal pain [R10.9] Hydronephrosis, unspecified hydronephrosis type [N13.30] Hypotension, unspecified hypotension type [I95.9] Urinary tract infection without hematuria, site unspecified [N39.0]  DISCHARGE DIAGNOSIS:  Active Problems:   Acute renal failure (ARF) ([place])   Ascites   Acute lower UTI   SECONDARY DIAGNOSIS:   Past Medical History:  Diagnosis Date  . Allergy   . Arthritis    "everywhere' - worse in back  . Body mass index 39.0-39.9, adult   . CAD (coronary artery disease)   . Chronic back pain   . Cirrhosis, non-alcoholic ([place])   . Diabetes mellitus without complication ([place])   . Elevated liver enzymes   . GERD (gastroesophageal reflux disease)   . Heart murmur   . Hyperlipidemia   . Hypertension   . Hypogonadism in male   . Melanoma ([place])    melanoma   . Melanoma in situ of right shoulder ([place])   . Myasthenia gravis ([place]) 01/2018  . Renal stones   . Sleep apnea    CPAP  . Uses hearing aid    doesn't wear    HOSPITAL COURSE:   78 year old male with past medical history of myasthenia gravis, hypertension, hyperlipidemia, GERD, diabetes, nonalcoholic liver cirrhosis who presented to the hospital due to abdominal distention, worsening lower extremity edema noted to be in acute kidney injury.  1.  Acute renal failure- multifactorial in nature related to suspected urinary retention status post Foley, relative hypotension and blood loss from rectal bleeding. -Patient was given some IV fluids, diuretics also transfused.  Renal function has  improved and is close to baseline now.  Patient will follow-up with nephrology as an outpatient.  2.  Urinary retention/chronic indwelling Foley-patient was having a complication as he was leaking around his Foley catheter site. -Urology was consulted while in the hospital and patient underwent a TURP with replacement of his Foley catheter.  As per urology patient likely did not have urinary retention but more likely had ascites.  Patient's Foley catheter has now been removed.  Patient is voiding well.  Patient will follow-up with urology in the next few weeks with Dr. Diamantina Providence.   - cont. Flomax.  3.  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   -Patient underwent ultrasound-guided paracentesis twice in the hospital with a total of 7.5 L of fluid removed.  Patient was also started on high-dose diuretics with Lasix and Aldactone will continue that.  He will follow-up with gastroenterology as an outpatient.  He also received albumin infusions while in the hospital. - currently stable for discharge.   4.  Anemia/thrombocytopenia-secondary to chronic liver disease.  -Patient was transfused 1 unit of packed red blood cells while in the hospital.  Hemoglobin has improved.  This can be further followed as an outpatient.  5.  History of myasthenia gravis- pt. Will continue Pyridostigmine  6.  History of recent prostate abscess with bacteremia- continue oral cefdinir and finish course and follow up with Urology.  - stable.    7.  Diabetes type 2 without complication- in the hospital patient was on sliding scale insulin.  He will resume his  metformin and glipizide upon discharge.  8.  BPH- pt. Will continue Flomax. - no urinary retention while in the hospital s/p TURP and replacement of foley which has now been removed and pt. Is voiding well. Follow up with outpatient Urology.   9.  Hyperlipidemia- pt. Will continue simvastatin.  10. Inguinal Fungal Rash - pt. Was discharged on  some nystatin powder to be applied to the area and he will cont. That.   DISCHARGE CONDITIONS:   Stable.   CONSULTS OBTAINED:  Treatment Team:  Anthonette Legato, MD Lucas Mallow, MD  DRUG ALLERGIES:   Allergies  Allergen Reactions  . Altace [Ramipril] Cough  . Ciprofloxacin Other (See Comments)    Contraindicated in patients with myasthenia gravis     DISCHARGE MEDICATIONS:   Allergies as of 12/20/2018      Reactions   Altace [ramipril] Cough   Ciprofloxacin Other (See Comments)   Contraindicated in patients with myasthenia gravis       Medication List    TAKE these medications   acetaminophen 325 MG tablet Commonly known as:  TYLENOL Place 2 tablets (650 mg total) into feeding tube every 6 (six) hours as needed for mild pain (or Fever >/= 101).   CALCIUM 1200 PO Take 1,200 mg by mouth.   cefUROXime 250 MG tablet Commonly known as:  CEFTIN Take 1 tablet (250 mg total) by mouth 2 (two) times daily.   furosemide 80 MG tablet Commonly known as:  LASIX Take 1 tablet (80 mg total) by mouth daily. Start taking on:  December 21, 2018   glipiZIDE 5 MG tablet Commonly known as:  GLUCOTROL Take 1 tablet (5 mg total) by mouth daily before breakfast.   losartan 100 MG tablet Commonly known as:  COZAAR Take 1 tablet (100 mg total) by mouth daily.   metFORMIN 500 MG tablet Commonly known as:  GLUCOPHAGE Take 2 tablets (1,000 mg total) by mouth 2 (two) times daily with a meal.   metoprolol succinate 50 MG 24 hr tablet Commonly known as:  TOPROL-XL Take 1 tablet (50 mg total) by mouth daily. Take with or immediately following a meal.   multivitamin with minerals Tabs tablet Take 1 tablet by mouth daily.   nystatin powder Commonly known as:  MYCOSTATIN/NYSTOP Apply topically 2 (two) times daily. Apply to inguinal area twice daily.   omeprazole 40 MG capsule Commonly known as:  PRILOSEC Take 40 mg by mouth daily.   pyridostigmine 60 MG tablet Commonly  known as:  MESTINON Take 60 mg by mouth 4 (four) times daily.   simvastatin 40 MG tablet Commonly known as:  ZOCOR Take 1 tablet (40 mg total) by mouth daily at 6 PM.   spironolactone 100 MG tablet Commonly known as:  ALDACTONE Take 1 tablet (100 mg total) by mouth daily. Start taking on:  December 21, 2018   tamsulosin 0.4 MG Caps capsule Commonly known as:  FLOMAX TAKE 1 CAPSULE(0.4 MG) BY MOUTH DAILY         DISCHARGE INSTRUCTIONS:   DIET:  Cardiac diet and Diabetic diet  DISCHARGE CONDITION:  Stable  ACTIVITY:  Activity as tolerated  OXYGEN:  Home Oxygen: No.   Oxygen Delivery: room air  DISCHARGE LOCATION:  home   If you experience worsening of your admission symptoms, develop shortness of breath, life threatening emergency, suicidal or homicidal thoughts you must seek medical attention immediately by calling 911 or calling your MD immediately  if symptoms less severe.  You  Must read complete instructions/literature along with all the possible adverse reactions/side effects for all the Medicines you take and that have been prescribed to you. Take any new Medicines after you have completely understood and accpet all the possible adverse reactions/side effects.   Please note  You were cared for by a hospitalist during your hospital stay. If you have any questions about your discharge medications or the care you received while you were in the hospital after you are discharged, you can call the unit and asked to speak with the hospitalist on call if the hospitalist that took care of you is not available. Once you are discharged, your primary care physician will handle any further medical issues. Please note that NO REFILLS for any discharge medications will be authorized once you are discharged, as it is imperative that you return to your primary care physician (or establish a relationship with a primary care physician if you do not have one) for your aftercare needs so  that they can reassess your need for medications and monitor your lab values.     Today   Acute events overnight.  Foley removed this morning by urology and patient is voiding well.  Denies any further shortness of breath.  Overall feels better.  Will discharge home on oral diuretics and follow-up with GI and nephrology as an outpatient.  VITAL SIGNS:  Blood pressure 128/73, pulse (!) 102, temperature 98.5 F (36.9 C), temperature source Oral, resp. rate 20, height 5\' 9"  (1.753 m), weight 108.6 kg, SpO2 96 %.  I/O:    Intake/Output Summary (Last 24 hours) at 12/20/2018 1458 Last data filed at 12/20/2018 0500 Gross per 24 hour  Intake 360 ml  Output 950 ml  Net -590 ml    PHYSICAL EXAMINATION:   GENERAL:  78 y.o.-year-old obese patient lying in bed in no acute distress.  EYES: Pupils equal, round, reactive to light and accommodation. No scleral icterus. Extraocular muscles intact.  HEENT: Head atraumatic, normocephalic. Oropharynx and nasopharynx clear.  NECK:  Supple, no jugular venous distention. No thyroid enlargement, no tenderness.  LUNGS: Normal breath sounds bilaterally, no wheezing, rales, rhonchi. No use of accessory muscles of respiration.  CARDIOVASCULAR: S1, S2 normal. No murmurs, rubs, or gallops.  ABDOMEN: Soft, nontender, distended, . Bowel sounds present. No organomegaly or mass.  EXTREMITIES: No cyanosis, clubbing, + 2 edema b/l.    NEUROLOGIC: Cranial nerves II through XII are intact. No focal Motor or sensory deficits b/l. PSYCHIATRIC: The patient is alert and oriented x 3.  SKIN: Fungal rash in the inguinal area, No lesion, or ulcer.   + Scrotal and penile edema but improved.    DATA REVIEW:   CBC Recent Labs  Lab 12/20/18 0332  WBC 3.1*  HGB 8.5*  HCT 26.4*  PLT 71*    Chemistries  Recent Labs  Lab 12/17/18 0317  12/20/18 0332  NA 144   < > 140  K 4.8   < > 3.8  CL 113*   < > 109  CO2 27   < > 26  GLUCOSE 166*   < > 141*  BUN 38*   < >  18  CREATININE 1.52*   < > 1.32*  CALCIUM 8.5*   < > 8.2*  AST 24  --   --   ALT 17  --   --   ALKPHOS 99  --   --   BILITOT 1.4*  --   --    < > =  values in this interval not displayed.    Cardiac Enzymes No results for input(s): TROPONINI in the last 168 hours.  Microbiology Results  Results for orders placed or performed during the hospital encounter of 12/15/18  Urine culture     Status: None   Collection Time: 12/15/18  2:48 PM  Result Value Ref Range Status   Specimen Description URINE, RANDOM  Final   Special Requests   Final    NONE Performed at [facility], [address] [place], [place] [address]    Culture NO GROWTH  Final   Report Status 12/16/2018 FINAL  Final  C difficile quick scan w PCR reflex     Status: None   Collection Time: 12/15/18  6:10 PM  Result Value Ref Range Status   C Diff antigen NEGATIVE NEGATIVE Final   C Diff toxin NEGATIVE NEGATIVE Final   C Diff interpretation No C. difficile detected.  Final    Comment: Performed at [facility], [address] [place], [place] [address]  Body fluid culture     Status: None (Preliminary result)   Collection Time: 12/17/18  1:35 PM  Result Value Ref Range Status   Specimen Description PERITONEAL FLUID  Final   Special Requests   Final    NONE Performed at [facility], [address] [place], [place] [address]    Gram Stain   Final    WBC PRESENT,BOTH PMN AND MONONUCLEAR NO ORGANISMS SEEN CYTOSPIN SMEAR    Culture   Final    NO GROWTH 2 DAYS Performed at [facility], [place] [address], [place], [place] [address]    Report Status PENDING  Incomplete    RADIOLOGY:  US Paracentesis  Result Date: 12/19/2018 INDICATION: Recurrent symptomatic ascites. Please perform ultrasound-guided paracentesis for therapeutic purposes. EXAM: ULTRASOUND-GUIDED PARACENTESIS COMPARISON:  Ultrasound-guided paracentesis 12/17/2018 (yielding 4.8 L of peritoneal fluid). CT abdomen  pelvis-12/15/2018; 11/26/2018 MEDICATIONS: None. COMPLICATIONS: None immediate. TECHNIQUE: Informed written consent was obtained from the patient after a discussion of the risks, benefits and alternatives to treatment. A timeout was performed prior to the initiation of the procedure. Initial ultrasound scanning demonstrates a moderate amount of ascites within the right lower abdominal quadrant. The right lower abdomen was prepped and draped in the usual sterile fashion. 1% lidocaine with epinephrine was used for local anesthesia. An ultrasound image was saved for documentation purposed. An 8 Fr Safe-T-Centesis catheter was introduced. The paracentesis was performed. The catheter was removed and a dressing was applied. The patient tolerated the procedure well without immediate post procedural complication. FINDINGS: A total of approximately 3 liters of serous fluid was removed. IMPRESSION: Successful ultrasound-guided paracentesis yielding 3 liters of peritoneal fluid. Electronically Signed   By: Sandi Mariscal M.D.   On: 12/19/2018 12:08      Management plans discussed with the patient, family and they are in agreement.  CODE STATUS:     Code Status Orders  (From admission, onward)         Start     Ordered   12/15/18 2008  Do not attempt resuscitation (DNR)  Continuous    Question Answer Comment  In the event of cardiac or respiratory ARREST Do not call a "code blue"   In the event of cardiac or respiratory ARREST Do not perform Intubation, CPR, defibrillation or ACLS   In the event of cardiac or respiratory ARREST Use medication by any route, position, wound care, and other measures to relive pain  and suffering. May use oxygen, suction and manual treatment of airway obstruction as needed for comfort.      12/15/18 2007          TOTAL TIME TAKING CARE OF THIS PATIENT: 40 minutes.    Henreitta Leber M.D on 12/20/2018 at 2:58 PM  Between 7am to 6pm - Pager - [phone]  After 6pm go  to [email or web address] - Proofreader  [facility]  Office  [phone]  CC: Primary care physician; [name], MD

## 2018-12-21 ENCOUNTER — Ambulatory Visit (INDEPENDENT_AMBULATORY_CARE_PROVIDER_SITE_OTHER): Payer: Medicare Other | Admitting: Family Medicine

## 2018-12-21 ENCOUNTER — Encounter: Payer: Self-pay | Admitting: Family Medicine

## 2018-12-21 DIAGNOSIS — N179 Acute kidney failure, unspecified: Secondary | ICD-10-CM

## 2018-12-21 DIAGNOSIS — E1159 Type 2 diabetes mellitus with other circulatory complications: Secondary | ICD-10-CM | POA: Diagnosis not present

## 2018-12-21 DIAGNOSIS — K766 Portal hypertension: Secondary | ICD-10-CM | POA: Diagnosis not present

## 2018-12-21 DIAGNOSIS — E1169 Type 2 diabetes mellitus with other specified complication: Secondary | ICD-10-CM

## 2018-12-21 DIAGNOSIS — E669 Obesity, unspecified: Secondary | ICD-10-CM

## 2018-12-21 DIAGNOSIS — D61818 Other pancytopenia: Secondary | ICD-10-CM | POA: Diagnosis not present

## 2018-12-21 DIAGNOSIS — I2510 Atherosclerotic heart disease of native coronary artery without angina pectoris: Secondary | ICD-10-CM | POA: Diagnosis not present

## 2018-12-21 DIAGNOSIS — I10 Essential (primary) hypertension: Secondary | ICD-10-CM

## 2018-12-21 LAB — BODY FLUID CULTURE: Culture: NO GROWTH

## 2018-12-21 NOTE — Assessment & Plan Note (Signed)
Discussed diet nutrition no exercise for now

## 2018-12-21 NOTE — Assessment & Plan Note (Signed)
Followed by GI

## 2018-12-21 NOTE — Assessment & Plan Note (Signed)
Will be checking CBC in 4 days

## 2018-12-21 NOTE — Progress Notes (Signed)
BP (!) 100/53 (BP Location: Left Arm, Patient Position: Sitting, Cuff Size: Normal)   Pulse 78   Temp 99.1 F (37.3 C)   Ht 5\' 9"  (1.753 m)   Wt 244 lb (110.7 kg)   SpO2 97%   BMI 36.03 kg/m    Subjective:    Patient ID: Mason Wilson., male    DOB: 1941/04/28, 78 y.o.   MRN: [id number]  HPI: Mason Wilson is a 78 y.o. male  Chief Complaint  Patient presents with  . Hospitalization Follow-up   Transition of Care Hospital Follow up.   Hospital/Facility:[facility] D/C Physician: Verdell Carmine D/C Date: 12-20-18  Records Requested: in chart Records Received:  Records Reviewed:done with pt and wife   Diagnoses on Discharge: Acute renal failure (ARF) ([place])   Ascites   Acute lower UTI And not listed in D/C anemia Fe  With Hgb 8.6 yesterday  Date of interactive Contact within 48 hours of discharge:  Contact was through: office visit   Date of 7 day or 14 day face-to-face visit:    today  Outpatient Encounter Medications as of 12/21/2018  Medication Sig  . acetaminophen (TYLENOL) 325 MG tablet Place 2 tablets (650 mg total) into feeding tube every 6 (six) hours as needed for mild pain (or Fever >/= 101).  . Calcium Carbonate-Vit D-Min (CALCIUM 1200 PO) Take 1,200 mg by mouth.  . cefUROXime (CEFTIN) 250 MG tablet Take 1 tablet (250 mg total) by mouth 2 (two) times daily.  . furosemide (LASIX) 80 MG tablet Take 1 tablet (80 mg total) by mouth daily.  Marland Kitchen glipiZIDE (GLUCOTROL) 5 MG tablet Take 1 tablet (5 mg total) by mouth daily before breakfast.  . losartan (COZAAR) 100 MG tablet Take 1 tablet (100 mg total) by mouth daily.  . metFORMIN (GLUCOPHAGE) 500 MG tablet Take 2 tablets (1,000 mg total) by mouth 2 (two) times daily with a meal.  . metoprolol succinate (TOPROL-XL) 50 MG 24 hr tablet Take 1 tablet (50 mg total) by mouth daily. Take with or immediately following a meal.  . Multiple Vitamin (MULTIVITAMIN WITH MINERALS) TABS tablet Take 1 tablet by mouth daily.  Marland Kitchen nystatin  (MYCOSTATIN/NYSTOP) powder Apply topically 2 (two) times daily. Apply to inguinal area twice daily.  Marland Kitchen omeprazole (PRILOSEC) 40 MG capsule Take 40 mg by mouth daily.   Marland Kitchen pyridostigmine (MESTINON) 60 MG tablet Take 60 mg by mouth 4 (four) times daily.  . simvastatin (ZOCOR) 40 MG tablet Take 1 tablet (40 mg total) by mouth daily at 6 PM.  . spironolactone (ALDACTONE) 100 MG tablet Take 1 tablet (100 mg total) by mouth daily.  . tamsulosin (FLOMAX) 0.4 MG CAPS capsule TAKE 1 CAPSULE(0.4 MG) BY MOUTH DAILY   No facility-administered encounter medications on file as of 12/21/2018.     Diagnostic Tests Reviewed/Disposition: CT, [place], xr, and mult labs  Consults:reviewed  Discharge Instructions reviewed and discussed may need to increase fluid pills and to take at lunch if needed  Disease/illness Education:done  Home Health/Community Services Discussions/Referrals:n/a  Establishment or re-establishment of referral orders for community resources:n/a  Discussion with other health care providers:n/a  Assessment and Support of treatment regimen adherence:discussed  Appointments Coordinated with: n/a  Education for self-management, independent living, and ADLs: discussed   Relevant past medical, surgical, family and social history reviewed and updated as indicated. Interim medical history since our last visit reviewed. Allergies and medications reviewed and updated.  Review of Systems  Constitutional: Negative.   Respiratory:  Negative.   Cardiovascular: Negative.     Per HPI unless specifically indicated above     Objective:    BP (!) 100/53 (BP Location: Left Arm, Patient Position: Sitting, Cuff Size: Normal)   Pulse 78   Temp 99.1 F (37.3 C)   Ht 5\' 9"  (1.753 m)   Wt 244 lb (110.7 kg)   SpO2 97%   BMI 36.03 kg/m   Wt Readings from Last 3 Encounters:  12/21/18 244 lb (110.7 kg)  12/20/18 239 lb 8 oz (108.6 kg)  12/15/18 258 lb 14.4 oz (117.4 kg)    Physical  Exam Constitutional:      Appearance: He is well-developed.  HENT:     Head: Normocephalic and atraumatic.  Eyes:     Conjunctiva/sclera: Conjunctivae normal.  Neck:     Musculoskeletal: Normal range of motion.  Cardiovascular:     Rate and Rhythm: Normal rate and regular rhythm.     Heart sounds: Normal heart sounds.  Pulmonary:     Effort: Pulmonary effort is normal.     Breath sounds: Normal breath sounds.  Musculoskeletal: Normal range of motion.  Skin:    Findings: No erythema.     Comments: Groin tinia changes  Neurological:     Mental Status: He is alert and oriented to person, place, and time.  Psychiatric:        Behavior: Behavior normal.        Thought Content: Thought content normal.        Judgment: Judgment normal.     Results for orders placed or performed during the hospital encounter of 12/15/18  Urine culture  Result Value Ref Range   Specimen Description URINE, RANDOM    Special Requests      NONE Performed at [facility], [address] [place], [place] [address]    Culture NO GROWTH    Report Status 12/16/2018 FINAL   C difficile quick scan w PCR reflex  Result Value Ref Range   C Diff antigen NEGATIVE NEGATIVE   C Diff toxin NEGATIVE NEGATIVE   C Diff interpretation No C. difficile detected.   Body fluid culture  Result Value Ref Range   Specimen Description PERITONEAL FLUID    Special Requests      NONE Performed at [facility], [place], [place] [address]    Gram Stain      WBC PRESENT,BOTH PMN AND MONONUCLEAR NO ORGANISMS SEEN CYTOSPIN SMEAR    Culture      NO GROWTH 3 DAYS Performed at [facility], [place] [address], [place], [place] [address]    Report Status 12/21/2018 FINAL   Comprehensive metabolic panel  Result Value Ref Range   Sodium 143 135 - 145 mmol/L   Potassium 4.1 3.5 - 5.1 mmol/L   Chloride 111 98 - 111 mmol/L   CO2 24 22 - 32 mmol/L   Glucose, Bld 116 (H) 70 - 99 mg/dL    BUN 47 (H) 8 - 23 mg/dL   Creatinine, Ser 2.04 (H) 0.61 - 1.24 mg/dL   Calcium 8.9 8.9 - 10.3 mg/dL   Total Protein 6.2 (L) 6.5 - 8.1 g/dL   Albumin 3.0 (L) 3.5 - 5.0 g/dL   AST 30 15 - 41 U/L   ALT 21 0 - 44 U/L   Alkaline Phosphatase 102 38 - 126 U/L   Total Bilirubin 1.4 (H) 0.3 - 1.2 mg/dL   GFR calc non Af Amer 31 (  L) >60 mL/min   GFR calc Af Amer 36 (L) >60 mL/min   Anion gap 8 5 - 15  CBC  Result Value Ref Range   WBC 4.9 4.0 - 10.5 K/uL   RBC 2.89 (L) 4.22 - 5.81 MIL/uL   Hemoglobin 8.9 (L) 13.0 - 17.0 g/dL   HCT 28.9 (L) 39.0 - 52.0 %   MCV 100.0 80.0 - 100.0 fL   MCH 30.8 26.0 - 34.0 pg   MCHC 30.8 30.0 - 36.0 g/dL   RDW 17.9 (H) 11.5 - 15.5 %   Platelets 115 (L) 150 - 400 K/uL   nRBC 0.0 0.0 - 0.2 %  Urinalysis, Complete w Microscopic  Result Value Ref Range   Color, Urine RED (A) YELLOW   APPearance CLOUDY (A) CLEAR   Specific Gravity, Urine 1.024 1.005 - 1.030   pH  5.0 - 8.0    TEST NOT REPORTED DUE TO COLOR INTERFERENCE OF URINE PIGMENT   Glucose, UA (A) NEGATIVE mg/dL    TEST NOT REPORTED DUE TO COLOR INTERFERENCE OF URINE PIGMENT   Hgb urine dipstick (A) NEGATIVE    TEST NOT REPORTED DUE TO COLOR INTERFERENCE OF URINE PIGMENT   Bilirubin Urine (A) NEGATIVE    TEST NOT REPORTED DUE TO COLOR INTERFERENCE OF URINE PIGMENT   Ketones, ur (A) NEGATIVE mg/dL    TEST NOT REPORTED DUE TO COLOR INTERFERENCE OF URINE PIGMENT   Protein, ur (A) NEGATIVE mg/dL    TEST NOT REPORTED DUE TO COLOR INTERFERENCE OF URINE PIGMENT   Nitrite (A) NEGATIVE    TEST NOT REPORTED DUE TO COLOR INTERFERENCE OF URINE PIGMENT   Leukocytes, UA (A) NEGATIVE    TEST NOT REPORTED DUE TO COLOR INTERFERENCE OF URINE PIGMENT   RBC / HPF >50 (H) 0 - 5 RBC/hpf   WBC, UA >50 (H) 0 - 5 WBC/hpf   Bacteria, UA MANY (A) NONE SEEN   Squamous Epithelial / LPF NONE SEEN 0 - 5   Non Squamous Epithelial PRESENT (A) NONE SEEN  Basic metabolic panel  Result Value Ref Range   Sodium 143 135 - 145  mmol/L   Potassium 4.6 3.5 - 5.1 mmol/L   Chloride 112 (H) 98 - 111 mmol/L   CO2 26 22 - 32 mmol/L   Glucose, Bld 112 (H) 70 - 99 mg/dL   BUN 43 (H) 8 - 23 mg/dL   Creatinine, Ser 1.86 (H) 0.61 - 1.24 mg/dL   Calcium 8.4 (L) 8.9 - 10.3 mg/dL   GFR calc non Af Amer 34 (L) >60 mL/min   GFR calc Af Amer 40 (L) >60 mL/min   Anion gap 5 5 - 15  CBC  Result Value Ref Range   WBC 2.9 (L) 4.0 - 10.5 K/uL   RBC 2.34 (L) 4.22 - 5.81 MIL/uL   Hemoglobin 7.1 (L) 13.0 - 17.0 g/dL   HCT 23.5 (L) 39.0 - 52.0 %   MCV 100.4 (H) 80.0 - 100.0 fL   MCH 30.3 26.0 - 34.0 pg   MCHC 30.2 30.0 - 36.0 g/dL   RDW 17.7 (H) 11.5 - 15.5 %   Platelets 80 (L) 150 - 400 K/uL   nRBC 0.0 0.0 - 0.2 %  Glucose, capillary  Result Value Ref Range   Glucose-Capillary 128 (H) 70 - 99 mg/dL  Glucose, capillary  Result Value Ref Range   Glucose-Capillary 98 70 - 99 mg/dL  Glucose, capillary  Result Value Ref Range   Glucose-Capillary 180 (H)  70 - 99 mg/dL  CBC  Result Value Ref Range   WBC 2.9 (L) 4.0 - 10.5 K/uL   RBC 2.40 (L) 4.22 - 5.81 MIL/uL   Hemoglobin 7.3 (L) 13.0 - 17.0 g/dL   HCT 24.4 (L) 39.0 - 52.0 %   MCV 101.7 (H) 80.0 - 100.0 fL   MCH 30.4 26.0 - 34.0 pg   MCHC 29.9 (L) 30.0 - 36.0 g/dL   RDW 17.5 (H) 11.5 - 15.5 %   Platelets 79 (L) 150 - 400 K/uL   nRBC 0.0 0.0 - 0.2 %  Comprehensive metabolic panel  Result Value Ref Range   Sodium 144 135 - 145 mmol/L   Potassium 4.8 3.5 - 5.1 mmol/L   Chloride 113 (H) 98 - 111 mmol/L   CO2 27 22 - 32 mmol/L   Glucose, Bld 166 (H) 70 - 99 mg/dL   BUN 38 (H) 8 - 23 mg/dL   Creatinine, Ser 1.52 (H) 0.61 - 1.24 mg/dL   Calcium 8.5 (L) 8.9 - 10.3 mg/dL   Total Protein 5.6 (L) 6.5 - 8.1 g/dL   Albumin 2.7 (L) 3.5 - 5.0 g/dL   AST 24 15 - 41 U/L   ALT 17 0 - 44 U/L   Alkaline Phosphatase 99 38 - 126 U/L   Total Bilirubin 1.4 (H) 0.3 - 1.2 mg/dL   GFR calc non Af Amer 44 (L) >60 mL/min   GFR calc Af Amer 51 (L) >60 mL/min   Anion gap 4 (L) 5 - 15    Glucose, capillary  Result Value Ref Range   Glucose-Capillary 163 (H) 70 - 99 mg/dL  Glucose, capillary  Result Value Ref Range   Glucose-Capillary 178 (H) 70 - 99 mg/dL  Glucose, capillary  Result Value Ref Range   Glucose-Capillary 155 (H) 70 - 99 mg/dL  Glucose, capillary  Result Value Ref Range   Glucose-Capillary 163 (H) 70 - 99 mg/dL  Body fluid cell count with differential  Result Value Ref Range   Fluid Type-FCT CYTOPERI    Color, Fluid YELLOW (A) YELLOW   Appearance, Fluid CLEAR CLEAR   WBC, Fluid 747 cu mm   Neutrophil Count, Fluid 19 %   Lymphs, Fluid 57 %   Monocyte-Macrophage-Serous Fluid 23 %   Eos, Fluid 1 %   Other Cells, Fluid 0 %  Albumin, pleural or peritoneal fluid  Result Value Ref Range   Albumin, Fluid <1.0 g/dL   Fluid Type-FALB CYTOPERI   Protein, pleural or peritoneal fluid  Result Value Ref Range   Total protein, fluid <3.0 g/dL   Fluid Type-FTP CYTOPERI   Glucose, pleural or peritoneal fluid  Result Value Ref Range   Glucose, Fluid 200 mg/dL   Fluid Type-FGLU CYTOPERI   Protein, body fluid (other)  Result Value Ref Range   Total Protein, Body Fluid Other 0.7 g/dL   Source of Sample PERITONEAL   CBC  Result Value Ref Range   WBC 2.3 (L) 4.0 - 10.5 K/uL   RBC 2.26 (L) 4.22 - 5.81 MIL/uL   Hemoglobin 7.1 (L) 13.0 - 17.0 g/dL   HCT 22.6 (L) 39.0 - 52.0 %   MCV 100.0 80.0 - 100.0 fL   MCH 31.4 26.0 - 34.0 pg   MCHC 31.4 30.0 - 36.0 g/dL   RDW 17.6 (H) 11.5 - 15.5 %   Platelets 69 (L) 150 - 400 K/uL   nRBC 0.0 0.0 - 0.2 %  Basic metabolic panel  Result Value Ref  Range   Sodium 143 135 - 145 mmol/L   Potassium 4.2 3.5 - 5.1 mmol/L   Chloride 113 (H) 98 - 111 mmol/L   CO2 25 22 - 32 mmol/L   Glucose, Bld 117 (H) 70 - 99 mg/dL   BUN 26 (H) 8 - 23 mg/dL   Creatinine, Ser 1.10 0.61 - 1.24 mg/dL   Calcium 8.2 (L) 8.9 - 10.3 mg/dL   GFR calc non Af Amer >60 >60 mL/min   GFR calc Af Amer >60 >60 mL/min   Anion gap 5 5 - 15  Hemoglobin  and hematocrit, blood  Result Value Ref Range   Hemoglobin 8.4 (L) 13.0 - 17.0 g/dL   HCT 27.2 (L) 39.0 - 52.0 %  Glucose, capillary  Result Value Ref Range   Glucose-Capillary 144 (H) 70 - 99 mg/dL  Occult blood card to lab, stool  Result Value Ref Range   Fecal Occult Bld NEGATIVE NEGATIVE  Glucose, capillary  Result Value Ref Range   Glucose-Capillary 141 (H) 70 - 99 mg/dL   Comment 1 Notify RN   Glucose, capillary  Result Value Ref Range   Glucose-Capillary 108 (H) 70 - 99 mg/dL  Glucose, capillary  Result Value Ref Range   Glucose-Capillary 196 (H) 70 - 99 mg/dL  Glucose, capillary  Result Value Ref Range   Glucose-Capillary 167 (H) 70 - 99 mg/dL  Basic metabolic panel  Result Value Ref Range   Sodium 140 135 - 145 mmol/L   Potassium 3.9 3.5 - 5.1 mmol/L   Chloride 111 98 - 111 mmol/L   CO2 25 22 - 32 mmol/L   Glucose, Bld 150 (H) 70 - 99 mg/dL   BUN 20 8 - 23 mg/dL   Creatinine, Ser 1.23 0.61 - 1.24 mg/dL   Calcium 8.3 (L) 8.9 - 10.3 mg/dL   GFR calc non Af Amer 57 (L) >60 mL/min   GFR calc Af Amer >60 >60 mL/min   Anion gap 4 (L) 5 - 15  CBC  Result Value Ref Range   WBC 3.1 (L) 4.0 - 10.5 K/uL   RBC 2.85 (L) 4.22 - 5.81 MIL/uL   Hemoglobin 8.6 (L) 13.0 - 17.0 g/dL   HCT 27.4 (L) 39.0 - 52.0 %   MCV 96.1 80.0 - 100.0 fL   MCH 30.2 26.0 - 34.0 pg   MCHC 31.4 30.0 - 36.0 g/dL   RDW 17.6 (H) 11.5 - 15.5 %   Platelets 74 (L) 150 - 400 K/uL   nRBC 0.0 0.0 - 0.2 %  Glucose, capillary  Result Value Ref Range   Glucose-Capillary 184 (H) 70 - 99 mg/dL   Comment 1 Notify RN    Comment 2 Document in Chart   Glucose, capillary  Result Value Ref Range   Glucose-Capillary 136 (H) 70 - 99 mg/dL   Comment 1 Notify RN    Comment 2 Document in Chart   Glucose, capillary  Result Value Ref Range   Glucose-Capillary 186 (H) 70 - 99 mg/dL   Comment 1 Notify RN    Comment 2 Document in Chart   CBC  Result Value Ref Range   WBC 3.1 (L) 4.0 - 10.5 K/uL   RBC 2.77 (L)  4.22 - 5.81 MIL/uL   Hemoglobin 8.5 (L) 13.0 - 17.0 g/dL   HCT 26.4 (L) 39.0 - 52.0 %   MCV 95.3 80.0 - 100.0 fL   MCH 30.7 26.0 - 34.0 pg   MCHC 32.2 30.0 - 36.0  g/dL   RDW 17.1 (H) 11.5 - 15.5 %   Platelets 71 (L) 150 - 400 K/uL   nRBC 0.0 0.0 - 0.2 %  Basic metabolic panel  Result Value Ref Range   Sodium 140 135 - 145 mmol/L   Potassium 3.8 3.5 - 5.1 mmol/L   Chloride 109 98 - 111 mmol/L   CO2 26 22 - 32 mmol/L   Glucose, Bld 141 (H) 70 - 99 mg/dL   BUN 18 8 - 23 mg/dL   Creatinine, Ser 1.32 (H) 0.61 - 1.24 mg/dL   Calcium 8.2 (L) 8.9 - 10.3 mg/dL   GFR calc non Af Amer 52 (L) >60 mL/min   GFR calc Af Amer >60 >60 mL/min   Anion gap 5 5 - 15  Glucose, capillary  Result Value Ref Range   Glucose-Capillary 202 (H) 70 - 99 mg/dL  Glucose, capillary  Result Value Ref Range   Glucose-Capillary 144 (H) 70 - 99 mg/dL  ECHOCARDIOGRAM COMPLETE  Result Value Ref Range   Weight 4,030.4 oz   Height 69 in   BP 117/60 mmHg  Type and screen [facility]  Result Value Ref Range   ABO/RH(D) AB NEG    Antibody Screen NEG    Sample Expiration 12/18/2018    Unit Number [id number]    Blood Component Type RED CELLS,LR    Unit division 00    Status of Unit ISSUED,FINAL    Transfusion Status OK TO TRANSFUSE    Crossmatch Result Compatible    Unit Number [id number]    Blood Component Type RBC LR PHER1    Unit division 00    Status of Unit ISSUED,FINAL    Transfusion Status OK TO TRANSFUSE    Crossmatch Result      Compatible Performed at [facility], [address] [place], [place] [address]   Prepare [facility]  Result Value Ref Range   Order Confirmation      ORDER PROCESSED BY BLOOD BANK Performed at [facility], [address] [place], [place] [address]   Prepare [facility]  Result Value Ref Range   Order Confirmation      ORDER PROCESSED BY BLOOD BANK Performed at [facility], [address], [place], [place] [address]     Cytology - Non PAP; Ascites  Result Value Ref Range   CYTOLOGY - NON GYN      Cytology - Non PAP CASE: [id number] PATIENT: Yasmin Formby Non-Gyn Cytology Report     SPECIMEN SUBMITTED: A. 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  PERITONEAL FLUID; ULTRASOUND-GUIDED PARACENTESIS: - NEGATIVE FOR MALIGNANCY. - ABSOLUTE NEUTROPHIL COUNT: 142 CELLS PER CU MM.  Slides reviewed: 1 cytospin; 1 ThinPrep; 1 cell block.  GROSS DESCRIPTION: A. Labeled: Abdominal fluid Received: Fresh Volume: 1000 mL Description: Yellow, slightly cloudy fluid in a 1000 mL glass evacuated container Submitted for ThinPrep and cell block.   Final Diagnosis performed by Quay Burow, MD.   Electronically signed 12/19/2018 2:10:00PM The electronic signature indicates that the named Attending Pathologist has evaluated the specimen  Technical component performe d at [place], [address], [place], [place] [address] Lab: [phone] Dir: Rush Farmer, MD, MMM  Professional component performed at [facility], [facility], [place], [place], [place] [address] Lab: [phone] Dir: Dellia Nims.  Reuel Derby, MD   Surgical pathology  Result Value Ref Range   SURGICAL PATHOLOGY      Surgical Pathology CASE: [id number] PATIENT: Daine Gravel Surgical Pathology Report     SPECIMEN SUBMITTED: A. Prostate chips  CLINICAL HISTORY: Status post transurethral resection of prostatic abscess in Jan 2020 who presents with urinary retention  PRE-OPERATIVE DIAGNOSIS: Urinary retention  hematuria  POST-OPERATIVE DIAGNOSIS: Same as pre op     DIAGNOSIS: A.  PROSTATE CHIPS: - BENIGN PROSTATIC STROMAL HYPERPLASIA. - INFLAMED UROTHELIAL MUCOSA WITH CYSTITIS CYSTICA AND AREAS  OF NECROSIS. - XANTHOGRANULOMATOUS PROSTATITIS. - FRAGMENTS OF NECROTIC TISSUE WITH PURULENT DEBRIS CONSISTENT WITH HISTORY OF ABSCESS. - BENIGN PROSTATIC GLANDS. - NEGATIVE FOR MALIGNANCY.   GROSS DESCRIPTION: A. Labeled: Prostate chips Received: Formalin Type of procedure: Transurethral resection of the prostate Weight: 9 grams Size: 4.5 x 4.5 x 1.0 cm (aggregate) Description: Irregular fragments of tan, rubbery soft tissue Percentage of tissue submitted for micros copic review: 100%  Block summary: 1-5 - entire specimen   Final Diagnosis performed by Quay Burow, MD.   Electronically signed 12/19/2018 2:06:06PM The electronic signature indicates that the named Attending Pathologist has evaluated the specimen  Technical component performed at [place], [address] [place], [place] [address] Lab: [phone] Dir: Rush Farmer, MD, MMM  Professional component performed at [facility], [facility], [place], [place], [place] [address] Lab: [phone] Dir: Dellia Nims. Reuel Derby, MD   BPAM [facility]  Result Value Ref Range   ISSUE DATE / TIME 735329924268    Blood Product Unit Number [id number]    PRODUCT CODE E0336V00    Unit Type and Rh 0600    Blood Product Expiration Date 211941740814    ISSUE DATE / TIME 481856314970    Blood Product Unit Number [id number]    PRODUCT CODE A1287O67    Unit Type and Rh 0600    Blood Product Expiration Date 672094709628       Assessment & Plan:   Problem List Items Addressed This Visit      Cardiovascular and Mediastinum   Obesity, diabetes, and hypertension syndrome ([place])    The current medical regimen is effective;  continue present plan and medications.       Essential hypertension    The current medical regimen is effective;  continue present plan and medications.       Portal hypertension (H[facility])    Followed by GI        Genitourinary   Acute renal failure (ARF) ([place])    Stabilizing on chart  review        Hematopoietic and Hemostatic   Pancytopenia ([place])    Will be checking CBC in 4 days        Other   Morbid obesity ([place])    Discussed diet nutrition no exercise for now          Follow up plan: Return in about 4 days (around 12/25/2018) for CBC to be done here, , , BMP, , Hemoglobin A1c.

## 2018-12-21 NOTE — Assessment & Plan Note (Signed)
Stabilizing on chart review

## 2018-12-21 NOTE — Assessment & Plan Note (Signed)
The current medical regimen is effective;  continue present plan and medications.  

## 2018-12-26 ENCOUNTER — Ambulatory Visit (INDEPENDENT_AMBULATORY_CARE_PROVIDER_SITE_OTHER): Payer: Medicare Other | Admitting: Family Medicine

## 2018-12-26 ENCOUNTER — Other Ambulatory Visit: Payer: Self-pay | Admitting: Family Medicine

## 2018-12-26 ENCOUNTER — Encounter: Payer: Self-pay | Admitting: Family Medicine

## 2018-12-26 ENCOUNTER — Other Ambulatory Visit: Payer: Self-pay | Admitting: *Deleted

## 2018-12-26 VITALS — BP 92/53 | HR 78 | Temp 98.5°F | Ht 69.0 in | Wt 249.8 lb

## 2018-12-26 DIAGNOSIS — I10 Essential (primary) hypertension: Secondary | ICD-10-CM | POA: Diagnosis not present

## 2018-12-26 DIAGNOSIS — E1169 Type 2 diabetes mellitus with other specified complication: Secondary | ICD-10-CM | POA: Diagnosis not present

## 2018-12-26 DIAGNOSIS — D61818 Other pancytopenia: Secondary | ICD-10-CM

## 2018-12-26 DIAGNOSIS — E669 Obesity, unspecified: Secondary | ICD-10-CM

## 2018-12-26 DIAGNOSIS — R42 Dizziness and giddiness: Secondary | ICD-10-CM

## 2018-12-26 DIAGNOSIS — K766 Portal hypertension: Secondary | ICD-10-CM

## 2018-12-26 DIAGNOSIS — I2510 Atherosclerotic heart disease of native coronary artery without angina pectoris: Secondary | ICD-10-CM

## 2018-12-26 DIAGNOSIS — N179 Acute kidney failure, unspecified: Secondary | ICD-10-CM | POA: Diagnosis not present

## 2018-12-26 DIAGNOSIS — E1159 Type 2 diabetes mellitus with other circulatory complications: Secondary | ICD-10-CM

## 2018-12-26 LAB — CBC WITH DIFFERENTIAL/PLATELET
Hematocrit: 31.3 % — ABNORMAL LOW (ref 37.5–51.0)
Hemoglobin: 10.2 g/dL — ABNORMAL LOW (ref 13.0–17.7)
Lymphocytes Absolute: 1 10*3/uL (ref 0.7–3.1)
Lymphs: 22 %
MCH: 31.5 pg (ref 26.6–33.0)
MCHC: 32.6 g/dL (ref 31.5–35.7)
MCV: 97 fL (ref 79–97)
MID (Absolute): 0.4 10*3/uL (ref 0.1–1.6)
MID: 10 %
NEUTROS ABS: 3.1 10*3/uL (ref 1.4–7.0)
Neutrophils: 68 %
Platelets: 88 10*3/uL — CL (ref 150–450)
RBC: 3.24 x10E6/uL — ABNORMAL LOW (ref 4.14–5.80)
RDW: 16.2 % — ABNORMAL HIGH (ref 11.6–15.4)
WBC: 4.5 10*3/uL (ref 3.4–10.8)

## 2018-12-26 LAB — BAYER DCA HB A1C WAIVED: HB A1C (BAYER DCA - WAIVED): 5.5 % (ref <7.0)

## 2018-12-26 MED ORDER — FUROSEMIDE 20 MG PO TABS: 20.0000 mg | ORAL_TABLET | Freq: Every day | ORAL | 0 refills | Status: AC

## 2018-12-26 NOTE — Patient Instructions (Addendum)
Cut furosemide tablets (80 mg) in half and take the half tablet in the morning. Can add a 20 mg furosemide in the early evening if swelling throughout the day. Call to check in at the end of the week with how you're doing.  Take the other fluid pill (spironolactone) as written.

## 2018-12-26 NOTE — Progress Notes (Signed)
BP (!) 92/53 (BP Location: Left Arm, Patient Position: Sitting, Cuff Size: Normal)   Pulse 78   Temp 98.5 F (36.9 C) (Oral)   Ht 5\' 9"  (1.753 m)   Wt 249 lb 12.8 oz (113.3 kg)   SpO2 98%   BMI 36.89 kg/m    Subjective:    Patient ID: Mason Chute., male    DOB: 08-10-1941, 78 y.o.   MRN: [id number]  HPI: Mason Wilson is a 78 y.o. male  Chief Complaint  Patient presents with  . Hypertension    4 day Follow-Up  . Obesity  . Pancytopenia  . Acute Renal Failure   Here today for 1 week f/u to recheck CBC, BMP, and also Hg A1C. Recently hospitalized for multiple issues including UTI, portal HTN with significant ascites, pancytopenia, and renal insufficiency. Seeing Heme/Onc tomorrow, renal specialist Feb 26th, and GI on March 4th. Currently taking ceftin for UTI, and lasix 80 mg and spironolactone 100 mg for swelling. Still feeling very fatigued, swelling somewhat improved. Denies fevers, dysuria, SOB.   Relevant past medical, surgical, family and social history reviewed and updated as indicated. Interim medical history since our last visit reviewed. Allergies and medications reviewed and updated.  Review of Systems  Per HPI unless specifically indicated above     Objective:    BP (!) 92/53 (BP Location: Left Arm, Patient Position: Sitting, Cuff Size: Normal)   Pulse 78   Temp 98.5 F (36.9 C) (Oral)   Ht 5\' 9"  (1.753 m)   Wt 249 lb 12.8 oz (113.3 kg)   SpO2 98%   BMI 36.89 kg/m   Wt Readings from Last 3 Encounters:  12/27/18 239 lb (108.4 kg)  12/26/18 249 lb 12.8 oz (113.3 kg)  12/21/18 244 lb (110.7 kg)    Physical Exam Vitals signs and nursing note reviewed.  Constitutional:      Appearance: Normal appearance.  HENT:     Head: Atraumatic.  Eyes:     Extraocular Movements: Extraocular movements intact.     Conjunctiva/sclera: Conjunctivae normal.  Neck:     Musculoskeletal: Normal range of motion and neck supple.  Cardiovascular:     Rate and  Rhythm: Normal rate.  Pulmonary:     Effort: Pulmonary effort is normal.     Breath sounds: Normal breath sounds.  Musculoskeletal: Normal range of motion.  Skin:    General: Skin is warm and dry.  Neurological:     General: No focal deficit present.     Mental Status: He is oriented to person, place, and time.  Psychiatric:        Mood and Affect: Mood normal.        Thought Content: Thought content normal.        Judgment: Judgment normal.     Results for orders placed or performed in visit on 78/29/56  Basic metabolic panel  Result Value Ref Range   Glucose 133 (H) 65 - 99 mg/dL   BUN 24 8 - 27 mg/dL   Creatinine, Ser 2.51 (H) 0.76 - 1.27 mg/dL   GFR calc non Af Amer 24 (L) >59 mL/min/1.73   GFR calc Af Amer 28 (L) >59 mL/min/1.73   BUN/Creatinine Ratio 10 10 - 24   Sodium 143 134 - 144 mmol/L   Potassium 5.2 3.5 - 5.2 mmol/L   Chloride 105 96 - 106 mmol/L   CO2 20 20 - 29 mmol/L   Calcium 9.1 8.6 - 10.2 mg/dL  Bayer DCA Hb A1c Waived  Result Value Ref Range   HB A1C (BAYER DCA - WAIVED) 5.5 <7.0 %  CBC With Differential/Platelet  Result Value Ref Range   WBC 4.5 3.4 - 10.8 x10E3/uL   RBC 3.24 (L) 4.14 - 5.80 x10E6/uL   Hemoglobin 10.2 (L) 13.0 - 17.7 g/dL   Hematocrit 31.3 (L) 37.5 - 51.0 %   MCV 97 79 - 97 fL   MCH 31.5 26.6 - 33.0 pg   MCHC 32.6 31.5 - 35.7 g/dL   RDW 16.2 (H) 11.6 - 15.4 %   Platelets 88 (LL) 150 - 450 x10E3/uL   Neutrophils 68 Not Estab. %   Lymphs 22 Not Estab. %   MID 10 Not Estab. %   Neutrophils Absolute 3.1 1.4 - 7.0 x10E3/uL   Lymphocytes Absolute 1.0 0.7 - 3.1 x10E3/uL   MID (Absolute) 0.4 0.1 - 1.6 X10E3/uL      Assessment & Plan:   Problem List Items Addressed This Visit      Cardiovascular and Mediastinum   Obesity, diabetes, and hypertension syndrome (HCC)    Sugars elevated during recent admission, will recheck A1C and continue to monitor and watch diet      Relevant Medications   furosemide (LASIX) 20 MG tablet    Other Relevant Orders   Basic metabolic panel (Completed)   Bayer DCA Hb A1c Waived (Completed)   Essential hypertension    BPs persistently low, decrease lasix to 40 mg in the morning with option of 20 mg dose in afternoons if swelling worsens. Continue to monitor home readings.       Relevant Medications   furosemide (LASIX) 20 MG tablet   Portal hypertension (HCC) - Primary    Following with GI, has appt coming up      Relevant Medications   furosemide (LASIX) 20 MG tablet   Other Relevant Orders   CBC With Differential/Platelet   Basic metabolic panel (Completed)     Genitourinary   Acute renal failure (ARF) (HCC)    Recheck bmp, push fluids. Continue current regimen      Relevant Orders   Basic metabolic panel (Completed)     Hematopoietic and Hemostatic   Pancytopenia ([place])    Repeat CBC improving, following with Heme Onc - continue per their instruction      Relevant Orders   CBC With Differential/Platelet    Other Visit Diagnoses    Light headedness           Follow up plan: Return for as scheduled.

## 2018-12-26 NOTE — Telephone Encounter (Signed)
Requested medication (s) are due for refill today: yes  Requested medication (s) are on the active medication list: yes  Last refill:    Future visit scheduled: last appointment 12/26/2018  Notes to clinic: see changes to medication 12/26/2018    Requested Prescriptions  Pending Prescriptions Disp Refills   furosemide (LASIX) 20 MG tablet [Pharmacy Med Name: FUROSEMIDE 20MG  TABLETS] 90 tablet     Sig: TAKE 1 TABLET(20 MG) BY MOUTH DAILY     Cardiovascular:  Diuretics - Loop Failed - 12/26/2018 11:37 AM      Failed - Ca in normal range and within 360 days    Calcium  Date Value Ref Range Status  12/20/2018 8.2 (L) 8.9 - 10.3 mg/dL Final         Failed - Cr in normal range and within 360 days    Creatinine, Ser  Date Value Ref Range Status  12/20/2018 1.32 (H) 0.61 - 1.24 mg/dL Final         Passed - K in normal range and within 360 days    Potassium  Date Value Ref Range Status  12/20/2018 3.8 3.5 - 5.1 mmol/L Final         Passed - Na in normal range and within 360 days    Sodium  Date Value Ref Range Status  12/20/2018 140 135 - 145 mmol/L Final  09/19/2018 141 134 - 144 mmol/L Final         Passed - Last BP in normal range    BP Readings from Last 1 Encounters:  12/26/18 (!) 92/53         Passed - Valid encounter within last 6 months    Recent Outpatient Visits          Today Portal hypertension [facility])   [facility] Volney American, PA-C   5 days ago Portal hypertension [facility])   [place] [name], MD   3 months ago Recurrent UTI   [facility] Volney American, [place]   3 months ago Need for influenza vaccination   [facility] [name], MD   9 months ago Diabetes mellitus without complication [facility])   [place], [place]      Future Appointments            In 2 months Crissman, Jeannette How, MD [place], [place]   In 4 months  [place], [place]   In 7 months [place], Gordan Payment [facility]

## 2018-12-27 ENCOUNTER — Inpatient Hospital Stay: Payer: Medicare Other | Attending: Internal Medicine | Admitting: Internal Medicine

## 2018-12-27 ENCOUNTER — Inpatient Hospital Stay: Payer: Medicare Other | Attending: Internal Medicine

## 2018-12-27 ENCOUNTER — Inpatient Hospital Stay: Payer: Medicare Other

## 2018-12-27 ENCOUNTER — Encounter: Payer: Self-pay | Admitting: Internal Medicine

## 2018-12-27 VITALS — BP 103/62 | HR 96 | Temp 98.1°F | Resp 16 | Wt 239.0 lb

## 2018-12-27 DIAGNOSIS — D61818 Other pancytopenia: Secondary | ICD-10-CM

## 2018-12-27 DIAGNOSIS — N184 Chronic kidney disease, stage 4 (severe): Secondary | ICD-10-CM | POA: Diagnosis not present

## 2018-12-27 DIAGNOSIS — K7460 Unspecified cirrhosis of liver: Secondary | ICD-10-CM | POA: Insufficient documentation

## 2018-12-27 DIAGNOSIS — K766 Portal hypertension: Secondary | ICD-10-CM | POA: Diagnosis not present

## 2018-12-27 DIAGNOSIS — R161 Splenomegaly, not elsewhere classified: Secondary | ICD-10-CM

## 2018-12-27 DIAGNOSIS — D500 Iron deficiency anemia secondary to blood loss (chronic): Secondary | ICD-10-CM

## 2018-12-27 LAB — CBC WITH DIFFERENTIAL/PLATELET
Abs Immature Granulocytes: 0.02 10*3/uL (ref 0.00–0.07)
Basophils Absolute: 0 10*3/uL (ref 0.0–0.1)
Basophils Relative: 1 %
Eosinophils Absolute: 0.1 10*3/uL (ref 0.0–0.5)
Eosinophils Relative: 3 %
HCT: 29.9 % — ABNORMAL LOW (ref 39.0–52.0)
Hemoglobin: 9.4 g/dL — ABNORMAL LOW (ref 13.0–17.0)
Immature Granulocytes: 1 %
Lymphocytes Relative: 16 %
Lymphs Abs: 0.6 10*3/uL — ABNORMAL LOW (ref 0.7–4.0)
MCH: 31 pg (ref 26.0–34.0)
MCHC: 31.4 g/dL (ref 30.0–36.0)
MCV: 98.7 fL (ref 80.0–100.0)
MONO ABS: 0.5 10*3/uL (ref 0.1–1.0)
Monocytes Relative: 11 %
Neutro Abs: 2.9 10*3/uL (ref 1.7–7.7)
Neutrophils Relative %: 68 %
Platelets: 76 10*3/uL — ABNORMAL LOW (ref 150–400)
RBC: 3.03 MIL/uL — AB (ref 4.22–5.81)
RDW: 15.3 % (ref 11.5–15.5)
WBC: 4.1 10*3/uL (ref 4.0–10.5)
nRBC: 0 % (ref 0.0–0.2)

## 2018-12-27 LAB — BASIC METABOLIC PANEL
BUN/Creatinine Ratio: 10 (ref 10–24)
BUN: 24 mg/dL (ref 8–27)
CO2: 20 mmol/L (ref 20–29)
Calcium: 9.1 mg/dL (ref 8.6–10.2)
Chloride: 105 mmol/L (ref 96–106)
Creatinine, Ser: 2.51 mg/dL — ABNORMAL HIGH (ref 0.76–1.27)
GFR calc non Af Amer: 24 mL/min/{1.73_m2} — ABNORMAL LOW (ref >59)
GFR, EST AFRICAN AMERICAN: 28 mL/min/{1.73_m2} — AB (ref >59)
Glucose: 133 mg/dL — ABNORMAL HIGH (ref 65–99)
Potassium: 5.2 mmol/L (ref 3.5–5.2)
Sodium: 143 mmol/L (ref 134–144)

## 2018-12-27 LAB — COMPREHENSIVE METABOLIC PANEL
ALT: 20 U/L (ref 0–44)
AST: 32 U/L (ref 15–41)
Albumin: 2.8 g/dL — ABNORMAL LOW (ref 3.5–5.0)
Alkaline Phosphatase: 130 U/L — ABNORMAL HIGH (ref 38–126)
Anion gap: 10 (ref 5–15)
BUN: 26 mg/dL — ABNORMAL HIGH (ref 8–23)
CO2: 24 mmol/L (ref 22–32)
Calcium: 8.5 mg/dL — ABNORMAL LOW (ref 8.9–10.3)
Chloride: 108 mmol/L (ref 98–111)
Creatinine, Ser: 2.57 mg/dL — ABNORMAL HIGH (ref 0.61–1.24)
GFR calc Af Amer: 27 mL/min — ABNORMAL LOW (ref >60)
GFR, EST NON AFRICAN AMERICAN: 23 mL/min — AB (ref >60)
Glucose, Bld: 160 mg/dL — ABNORMAL HIGH (ref 70–99)
Potassium: 4.9 mmol/L (ref 3.5–5.1)
Sodium: 142 mmol/L (ref 135–145)
Total Bilirubin: 0.9 mg/dL (ref 0.3–1.2)
Total Protein: 5.9 g/dL — ABNORMAL LOW (ref 6.5–8.1)

## 2018-12-27 NOTE — Progress Notes (Signed)
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  Patient had unfortunately complicated clinical course of the last 2 months or so.  Patient was admitted to hospital for draining prostate abscess which ended up in severe sepsis/shock needing intubation.  Patient also decompensation of cirrhosis needing paracentesis.  Patient also noted to have worsening renal sufficiency followed by nephrology in the hospital.  Did not need dialysis.  With regards to anemia patient-patient received IV iron in the hospital.  Also received 2 units of PRBC transfusion for hemoglobin of 7.3.  Patient is currently home.  Is having borderline blood pressures ranging between 14-782 systolic; intermittent dizziness.  Complains of significant fatigue.   Review of Systems  Constitutional: Positive for malaise/fatigue. Negative for chills, diaphoresis, fever and weight loss.  HENT: Negative for nosebleeds and sore throat.   Eyes: Negative for double vision.  Respiratory: Negative for cough, hemoptysis, sputum production, shortness of breath and wheezing.   Cardiovascular: Negative for chest pain, palpitations, orthopnea and leg swelling.  Gastrointestinal: Negative for abdominal pain, blood in stool, constipation, diarrhea, heartburn, melena,  nausea and vomiting.       Abdominal distention.  Genitourinary: Negative for dysuria, frequency and urgency.  Musculoskeletal: Negative for back pain and joint pain.  Skin: Negative.  Negative for itching and rash.  Neurological: Negative for dizziness, tingling, focal weakness, weakness and headaches.  Endo/Heme/Allergies: Bruises/bleeds easily.  Psychiatric/Behavioral: Negative for depression. The patient is not nervous/anxious and does not have insomnia.      I have reviewed the past medical history, past surgical history, social history and family history with the patient and they are unchanged from previous note unless stated above.  ALLERGIES:  is allergic to altace [ramipril] and ciprofloxacin.  MEDICATIONS:  Current Outpatient Medications  Medication Sig Dispense Refill  . acetaminophen (TYLENOL) 325 MG tablet Place 2 tablets (650 mg total) into feeding tube every 6 (six) hours as needed for mild pain (or Fever >/= 101). 10 tablet 0  . Calcium Carbonate-Vit D-Min (CALCIUM 1200 PO) Take 1,200 mg by mouth.    . cefUROXime (CEFTIN) 250 MG tablet Take 1 tablet (250 mg total) by mouth 2 (two) times daily. 90 tablet 0  . furosemide (LASIX) 20 MG tablet Take 1 tablet (20 mg total) by mouth daily. 30 tablet 0  . furosemide (LASIX) 80 MG tablet Take 1 tablet (80 mg total) by mouth daily. 30 tablet 1  . glipiZIDE (GLUCOTROL) 5 MG tablet Take 1 tablet (5 mg total) by mouth daily before breakfast. 90 tablet 4  . losartan (COZAAR) 100 MG tablet Take 1 tablet (100 mg total) by mouth daily. 90 tablet 4  . metFORMIN (GLUCOPHAGE) 500 MG tablet Take 2 tablets (1,000 mg total) by mouth 2 (two) times daily with a meal. 360 tablet 4  . metoprolol succinate (TOPROL-XL) 50 MG 24 hr tablet Take 1 tablet (50 mg total) by mouth daily. Take  with or immediately following a meal. 90 tablet 4  . Multiple Vitamin (MULTIVITAMIN WITH MINERALS) TABS tablet Take 1 tablet by mouth daily. 30 tablet 0  . nystatin  (MYCOSTATIN/NYSTOP) powder Apply topically 2 (two) times daily. Apply to inguinal area twice daily. 15 g 0  . omeprazole (PRILOSEC) 40 MG capsule Take 40 mg by mouth daily.     Marland Kitchen pyridostigmine (MESTINON) 60 MG tablet Take 60 mg by mouth 4 (four) times daily.    . simvastatin (ZOCOR) 40 MG tablet Take 1 tablet (40 mg total) by mouth daily at 6 PM. 90 tablet 4  . spironolactone (ALDACTONE) 100 MG tablet Take 1 tablet (100 mg total) by mouth daily. 30 tablet 1  . tamsulosin (FLOMAX) 0.4 MG CAPS capsule TAKE 1 CAPSULE(0.4 MG) BY MOUTH DAILY 90 capsule 3   No current facility-administered medications for this visit.     PHYSICAL EXAMINATION:   BP 103/62 (BP Location: Left Arm, Patient Position: Sitting, Cuff Size: Normal)   Pulse 96   Temp 98.1 F (36.7 C) (Tympanic)   Resp 16   Wt 239 lb (108.4 kg)   BMI 35.29 kg/m   Filed Weights   12/27/18 1326  Weight: 239 lb (108.4 kg)    .Physical Exam  Constitutional: He is oriented to person, place, and time and well-developed, well-nourished, and in no distress.  Elderly frail-appearing Caucasian male patient.  Accompanied his wife.  Is walking himself.  HENT:  Head: Normocephalic and atraumatic.  Mouth/Throat: Oropharynx is clear and moist. No oropharyngeal exudate.  Eyes: Pupils are equal, round, and reactive to light.  Neck: Normal range of motion. Neck supple.  Cardiovascular: Normal rate and regular rhythm.  Pulmonary/Chest: No respiratory distress. He has no wheezes.  Abdominal: Soft. Bowel sounds are normal. He exhibits no distension and no mass. There is no abdominal tenderness. There is no rebound and no guarding.  Musculoskeletal: Normal range of motion.        General: No tenderness or edema.  Neurological: He is alert and oriented to person, place, and time.  Skin: Skin is warm.  Multiple chronic bruises.  Psychiatric: Affect normal.     LABORATORY DATA:  I have reviewed the data as listed    Component Value  Date/Time   NA 142 12/27/2018 1259   NA 143 12/26/2018 1012   K 4.9 12/27/2018 1259   CL 108 12/27/2018 1259   CO2 24 12/27/2018 1259   GLUCOSE 160 (H) 12/27/2018 1259   BUN 26 (H) 12/27/2018 1259   BUN 24 12/26/2018 1012   CREATININE 2.57 (H) 12/27/2018 1259   CALCIUM 8.5 (L) 12/27/2018 1259   PROT 5.9 (L) 12/27/2018 1259   PROT 6.4 09/19/2018 1008   ALBUMIN 2.8 (L) 12/27/2018 1259   ALBUMIN 3.8 09/19/2018 1008   AST 32 12/27/2018 1259   AST 43 (H) 03/02/2018 0902   ALT 20 12/27/2018 1259   ALT 49 (H) 03/02/2018 0902   ALKPHOS 130 (H) 12/27/2018 1259   BILITOT 0.9 12/27/2018 1259   BILITOT 1.7 (H) 09/19/2018 1008   GFRNONAA 23 (L) 12/27/2018 1259   GFRAA 27 (L) 12/27/2018 1259    No results found for: SPEP, UPEP  Lab Results  Component Value Date   WBC 4.1 12/27/2018   NEUTROABS 2.9 12/27/2018   HGB 9.4 (L) 12/27/2018   HCT 29.9 (L) 12/27/2018   MCV 98.7 12/27/2018   PLT 76 (L) 12/27/2018      Chemistry      Component  Value Date/Time   NA 142 12/27/2018 1259   NA 143 12/26/2018 1012   K 4.9 12/27/2018 1259   CL 108 12/27/2018 1259   CO2 24 12/27/2018 1259   BUN 26 (H) 12/27/2018 1259   BUN 24 12/26/2018 1012   CREATININE 2.57 (H) 12/27/2018 1259      Component Value Date/Time   CALCIUM 8.5 (L) 12/27/2018 1259   ALKPHOS 130 (H) 12/27/2018 1259   AST 32 12/27/2018 1259   AST 43 (H) 03/02/2018 0902   ALT 20 12/27/2018 1259   ALT 49 (H) 03/02/2018 0902   BILITOT 0.9 12/27/2018 1259   BILITOT 1.7 (H) 09/19/2018 1008       ASSESSMENT & PLAN:   Pancytopenia ([place]) # Pancytopenia- Asymptomatic. Today labs white count- 6/ hemoglobin 9.4 platelets of 88-secondary to cirrhosis and portal hypertension splenomegaly.   #Anemia hemoglobin 9.4-multifactorial/given recent sepsis/shock.  Hold off any IV iron at this time.  Reevaluate in 3 months.  # Cirrhosis/portal hypertension-status post decompensation.  Followed by GI.  Lasix as spironolactone.  #CKD-IV  [Dr.Kolluru] creatinine 2.5 today.  Again multifactorial-given the dizziness borderline blood pressures recommend checking 40 of Lasix [patient currently on 80 of Lasix]; continue spironolactone.  # MG-as per neurology status post plasma exchange; stable.  # DISPOSITION:  # NO infusion today # follow up in 3 months/MD/ labs- cbc/cmp/iron studies/ferritin-/possible venofer-Dr.B       Mason Sickle, MD 12/27/2018 7:27 PM

## 2018-12-27 NOTE — Assessment & Plan Note (Addendum)
#   Pancytopenia- Asymptomatic. Today labs white count- 6/ hemoglobin 9.4 platelets of 88-secondary to cirrhosis and portal hypertension splenomegaly.   #Anemia hemoglobin 9.4-multifactorial/given recent sepsis/shock.  Hold off any IV iron at this time.  Reevaluate in 3 months.  # Cirrhosis/portal hypertension-status post decompensation.  Followed by GI.  Lasix as spironolactone.  #CKD-IV [Dr.Kolluru] creatinine 2.5 today.  Again multifactorial-given the dizziness borderline blood pressures recommend checking 40 of Lasix [patient currently on 80 of Lasix]; continue spironolactone.  # MG-as per neurology status post plasma exchange; stable.  # DISPOSITION:  # NO infusion today # follow up in 3 months/MD/ labs- cbc/cmp/iron studies/ferritin-/possible venofer-Dr.B

## 2018-12-27 NOTE — Progress Notes (Signed)
No treatment today per Dr. Rogue Bussing

## 2018-12-29 ENCOUNTER — Telehealth: Payer: Self-pay | Admitting: Family Medicine

## 2018-12-29 ENCOUNTER — Ambulatory Visit: Payer: Self-pay | Admitting: *Deleted

## 2018-12-29 NOTE — Telephone Encounter (Signed)
Called and spoke with pt's wife (DPR reviewed). Rachel's message relayed. She verbalized understanding.

## 2018-12-29 NOTE — Telephone Encounter (Signed)
Summary: Anitbiotic Management   Pt's wife called and stated he was put on an antibiotic this week. They would like to know once the antibiotic is done, do they need to have it refilled or just assume the issue is cleared up. Please advise. [phone]     Patient was seen Tuesday- and had lab work done- calling for lab results.( Message in Okay reviewed) Patient was treated at hospital for sepsis. After finishing (due to complete in 2 weeks) antibiotics - does he need follow up for that to make sure he has cleared infection?  Wife reports BP readings: 2/20 1:45 pm 96/45          3:50 pm 119/53 Highest- 2/18 8:40am 121/65  Will bring reading to providers that they see.   Reason for Disposition . [1] Caller requesting NON-URGENT health information AND [2] PCP's office is the best resource    Lab results reviewed. Question about follow up after antibiotic is finished.  Answer Assessment - Initial Assessment Questions 1. REASON FOR CALL or QUESTION: "What is your reason for calling today?" or "How can I best help you?" or "What question do you have that I can help answer?"     Questions about lab results and follow up after antibiotic treatment.  Protocols used: INFORMATION ONLY CALL-A-AH

## 2018-12-29 NOTE — Telephone Encounter (Signed)
Result note sent via mychart as his status is active. OK to read pt the result note  Copied from [place] [phone]. Topic: General - Inquiry >> Dec 29, 2018  8:48 AM Virl Axe D wrote: Reason for CRM: Pt's wife called to follow up on pt's lab results from earlier this week. They have not heard anything. Please advise.

## 2018-12-30 NOTE — Assessment & Plan Note (Signed)
Recheck bmp, push fluids. Continue current regimen

## 2018-12-30 NOTE — Telephone Encounter (Signed)
Can come in early next week for a urine recheck if still having any residual sxs.

## 2018-12-30 NOTE — Assessment & Plan Note (Signed)
Repeat CBC improving, following with Heme Onc - continue per their instruction

## 2018-12-30 NOTE — Assessment & Plan Note (Signed)
Sugars elevated during recent admission, will recheck A1C and continue to monitor and watch diet

## 2018-12-30 NOTE — Assessment & Plan Note (Signed)
Following with GI, has appt coming up

## 2018-12-30 NOTE — Assessment & Plan Note (Signed)
BPs persistently low, decrease lasix to 40 mg in the morning with option of 20 mg dose in afternoons if swelling worsens. Continue to monitor home readings.

## 2019-01-01 ENCOUNTER — Ambulatory Visit
Admission: RE | Admit: 2019-01-01 | Discharge: 2019-01-01 | Disposition: A | Discharge status: Home / Self Care | Payer: Medicare Other | Source: Ambulatory Visit | Attending: Urology | Admitting: Urology

## 2019-01-01 DIAGNOSIS — K7469 Other cirrhosis of liver: Secondary | ICD-10-CM | POA: Diagnosis not present

## 2019-01-01 DIAGNOSIS — N281 Cyst of kidney, acquired: Secondary | ICD-10-CM | POA: Diagnosis not present

## 2019-01-01 DIAGNOSIS — N1339 Other hydronephrosis: Secondary | ICD-10-CM | POA: Insufficient documentation

## 2019-01-01 NOTE — Telephone Encounter (Signed)
Called and spoke to patient's wife and Rachel's message relayed. She stated that pt will be fine as he is planing on see the urologist. Asked wife to give [place] a call back with concerns.

## 2019-01-03 DIAGNOSIS — I959 Hypotension, unspecified: Secondary | ICD-10-CM | POA: Diagnosis not present

## 2019-01-03 DIAGNOSIS — N179 Acute kidney failure, unspecified: Secondary | ICD-10-CM | POA: Diagnosis not present

## 2019-01-03 DIAGNOSIS — N139 Obstructive and reflux uropathy, unspecified: Secondary | ICD-10-CM | POA: Diagnosis not present

## 2019-01-03 DIAGNOSIS — R609 Edema, unspecified: Secondary | ICD-10-CM | POA: Diagnosis not present

## 2019-01-05 DIAGNOSIS — G4733 Obstructive sleep apnea (adult) (pediatric): Secondary | ICD-10-CM | POA: Diagnosis not present

## 2019-01-05 DIAGNOSIS — G1229 Other motor neuron disease: Secondary | ICD-10-CM | POA: Diagnosis not present

## 2019-01-05 DIAGNOSIS — G7001 Myasthenia gravis with (acute) exacerbation: Secondary | ICD-10-CM | POA: Diagnosis not present

## 2019-01-05 DIAGNOSIS — I950 Idiopathic hypotension: Secondary | ICD-10-CM | POA: Diagnosis not present

## 2019-01-05 DIAGNOSIS — G7000 Myasthenia gravis without (acute) exacerbation: Secondary | ICD-10-CM | POA: Diagnosis not present

## 2019-01-08 ENCOUNTER — Other Ambulatory Visit: Payer: Self-pay | Admitting: Nephrology

## 2019-01-08 ENCOUNTER — Encounter: Payer: Self-pay | Admitting: Urology

## 2019-01-08 ENCOUNTER — Ambulatory Visit
Admission: RE | Admit: 2019-01-08 | Discharge: 2019-01-08 | Disposition: A | Discharge status: Home / Self Care | Payer: Medicare Other | Source: Ambulatory Visit | Attending: Nephrology | Admitting: Nephrology

## 2019-01-08 ENCOUNTER — Ambulatory Visit (INDEPENDENT_AMBULATORY_CARE_PROVIDER_SITE_OTHER): Payer: Medicare Other | Admitting: Urology

## 2019-01-08 VITALS — BP 122/72 | HR 129 | Ht 69.0 in | Wt 230.8 lb

## 2019-01-08 DIAGNOSIS — R350 Frequency of micturition: Secondary | ICD-10-CM

## 2019-01-08 DIAGNOSIS — R188 Other ascites: Secondary | ICD-10-CM

## 2019-01-08 DIAGNOSIS — N401 Enlarged prostate with lower urinary tract symptoms: Secondary | ICD-10-CM

## 2019-01-08 LAB — BODY FLUID CELL COUNT WITH DIFFERENTIAL
Eos, Fluid: 0 %
Lymphs, Fluid: 37 %
Monocyte-Macrophage-Serous Fluid: 51 %
Neutrophil Count, Fluid: 12 %
Other Cells, Fluid: 0 %
WBC FLUID: 202 uL

## 2019-01-08 MED ORDER — NYSTATIN-TRIAMCINOLONE 100000-0.1 UNIT/GM-% EX OINT: 1.0000 "application " | TOPICAL_OINTMENT | Freq: Two times a day (BID) | CUTANEOUS | 0 refills | Status: AC

## 2019-01-08 MED ORDER — ALBUMIN HUMAN 25 % IV SOLN
INTRAVENOUS | Status: AC
  Administered 2019-01-08: 25 g
  Filled 2019-01-08: qty 100

## 2019-01-08 NOTE — Procedures (Signed)
PROCEDURE SUMMARY:  Successful image-guided paracentesis from the right lower abdomen.  Yielded 4.1 liters of clear yellow fluid.  No immediate complications.  EBL: zero Patient tolerated well.   Specimen was sent for labs.  Please see imaging section of Epic for full dictation.  Joaquim Nam PA-C 01/08/2019 1:31 PM

## 2019-01-08 NOTE — Progress Notes (Signed)
   01/08/2019 12:35 PM   Mason Wilson. 06-10-1941 [id number]  Reason for visit: Follow up TURP  HPI: I saw Mason Wilson back in urology clinic today for follow-up.  He is a 78 year old male with a number of co-morbidities including cirrhosis, and history of prostatic abscess with sepsis treated 11/26/2018 with emergent transurethral resection with Dr. Jeffie Pollock.  Had a prolonged hospitalization after this.  Postoperatively, he had numerous catheters replaced(highly suspect he was never in true retention, however bladder scan was falsely elevated in the setting of severe ascites), and ultimately he underwent TURP on 12/17/2018 with Dr. [name].  From a urology perspective, he has been doing well since that time and is voiding spontaneously with strong stream.  He denies any feeling of incomplete emptying.  He is still having some significant urgency and occasional urge incontinence.  He denies any gross hematuria.  We discussed the normal recovery period after TURP, and I expect his urgency and urge incontinence to continue to improve.  RTC 3 months for symptom check  A total of 15 minutes were spent face-to-face with the patient, greater than 50% was spent in patient education, counseling, and coordination of care regarding BPH/postop expectations.   [place], [facility] [address] [place], [place] [address] [phone]

## 2019-01-08 NOTE — Patient Instructions (Signed)
Balanitis    Balanitis is swelling and irritation (inflammation) of the head of the penis (glans penis). The condition may also cause inflammation of the skin around the glans penis (foreskin) in men who have not been circumcised. It may develop because of an infection or another medical condition.  Balanitis occurs most often among men who have not had their foreskin removed (uncircumcised men). Balanitis sometimes causes scarring of the penis or foreskin, which can require surgery. Untreated balanitis can increase the risk of penile cancer.  What are the causes?  Common causes of this condition include:  · Poor personal hygiene, especially in uncircumcised men. Not cleaning the glans penis and foreskin well can result in buildup of bacteria, viruses, and yeast, which can lead to infection and inflammation.  · Irritation and lack of air flow due to fluid (smegma) that can build up on the glans penis.  Other causes include:  · Chemical irritation from products such as soaps or shower gels (especially those that have fragrance), condoms, personal lubricants, petroleum jelly, spermicides, or fabric softeners.  · Skin conditions, such as eczema, dermatitis, and psoriasis.  · Allergies to medicines, such as tetracycline and sulfa drugs.  · Certain medical conditions, including liver cirrhosis, congestive heart failure, diabetes, and kidney disease.  · Infections, such as candidiasis, HPV (human papillomavirus), herpes simplex, gonorrhea, and syphilis.  · Severe obesity.  What increases the risk?  The following factors may make you more likely to develop this condition:  · Having diabetes. This is the most common risk factor.  · Having a tight foreskin that is difficult to pull back (retract) past the glans.  · Having sexual intercourse without using a condom.  What are the signs or symptoms?  Symptoms of this condition include:  · Discharge from under the foreskin.  · A bad smell.  · Pain or difficulty retracting the  foreskin.  · Tenderness, redness, and swelling of the glans.  · A rash or sores on the glans or foreskin.  · Itchiness.  · Inability to get an erection due to pain.  · Difficulty urinating.  · Scarring of the penis or foreskin, in some cases.  How is this diagnosed?  This condition may be diagnosed based on:  · A physical exam.  · Testing a swab of discharge to check for bacterial or fungal infection.  · Blood tests:  ? To check for viruses that can cause balanitis.  ? To check your blood sugar (glucose) level. High blood glucose could be a sign of diabetes, which can cause balanitis.  How is this treated?  Treatment for balanitis depends on the cause. Treatment may include:  · Improving personal hygiene. Your health care provider may recommend sitting in a bath of warm water that is deep enough to cover your hips and buttocks (sitz bath).  · Medicines such as:  ? Creams or ointments to reduce swelling (steroids) or to treat an infection.  ? Antibiotic medicine.  ? Antifungal medicine.  · Surgery to remove or cut the foreskin (circumcision). This may be done if you have scarring on the foreskin that makes it difficult to retract.  · Controlling other medical problems that may be causing your condition or making it worse.  Follow these instructions at home:  · Do not have sex until the condition clears up, or until your health care provider approves.  · Keep your penis clean and dry. Take sitz baths as recommended by your health care provider.  ·   Avoid products that irritate your skin or make symptoms worse, such as soaps and shower gels that have fragrance.  · Take over-the-counter and prescription medicines only as told by your health care provider.  ? If you were prescribed an antibiotic medicine or a cream or ointment, use it as told by your health care provider. Do not stop using your medicine, cream, or ointment even if you start to feel better.  ? Do not drive or use heavy machinery while taking prescription  pain medicine.  Contact a health care provider if:  · Your symptoms get worse or do not improve with home care.  · You develop chills or a fever.  · You have trouble urinating.  · You cannot retract your foreskin.  Get help right away if:  · You develop severe pain.  · You are unable to urinate.  Summary  · Balanitis is inflammation of the head of the penis (glans penis) caused by irritation or infection.  · Balanitis causes pain, redness, and swelling of the glans penis.  · This condition is most common among uncircumcised men who do not keep their glans penis clean and in men who have diabetes.  · Treatment may include creams or ointments.  · Good hygiene is important for prevention. This includes pulling back the foreskin when washing your penis.  This information is not intended to replace advice given to you by your health care provider. Make sure you discuss any questions you have with your health care provider.  Document Released: 03/13/2009 Document Revised: 09/13/2016 Document Reviewed: 09/13/2016  Elsevier Interactive Patient Education © 2019 [employer]

## 2019-01-09 LAB — PATHOLOGIST SMEAR REVIEW

## 2019-01-10 ENCOUNTER — Other Ambulatory Visit: Payer: Self-pay | Admitting: Gastroenterology

## 2019-01-10 ENCOUNTER — Ambulatory Visit
Admission: RE | Admit: 2019-01-10 | Discharge: 2019-01-10 | Disposition: A | Discharge status: Home / Self Care | Payer: Medicare Other | Source: Ambulatory Visit | Attending: Gastroenterology | Admitting: Gastroenterology

## 2019-01-10 ENCOUNTER — Encounter: Payer: Self-pay | Admitting: Gastroenterology

## 2019-01-10 ENCOUNTER — Telehealth: Payer: Self-pay

## 2019-01-10 ENCOUNTER — Other Ambulatory Visit
Admission: RE | Admit: 2019-01-10 | Discharge: 2019-01-10 | Disposition: A | Discharge status: Home / Self Care | Payer: Medicare Other | Source: Home / Self Care | Attending: Gastroenterology | Admitting: Gastroenterology

## 2019-01-10 ENCOUNTER — Ambulatory Visit
Admission: RE | Admit: 2019-01-10 | Discharge: 2019-01-10 | Disposition: A | Discharge status: Home / Self Care | Payer: Medicare Other | Source: Home / Self Care | Attending: Gastroenterology | Admitting: Gastroenterology

## 2019-01-10 ENCOUNTER — Other Ambulatory Visit: Payer: Self-pay

## 2019-01-10 ENCOUNTER — Ambulatory Visit (INDEPENDENT_AMBULATORY_CARE_PROVIDER_SITE_OTHER): Payer: Medicare Other | Admitting: Gastroenterology

## 2019-01-10 VITALS — BP 134/78 | HR 130 | Ht 69.0 in | Wt 222.6 lb

## 2019-01-10 DIAGNOSIS — Z515 Encounter for palliative care: Secondary | ICD-10-CM | POA: Diagnosis not present

## 2019-01-10 DIAGNOSIS — K7460 Unspecified cirrhosis of liver: Secondary | ICD-10-CM

## 2019-01-10 DIAGNOSIS — I248 Other forms of acute ischemic heart disease: Secondary | ICD-10-CM | POA: Diagnosis not present

## 2019-01-10 DIAGNOSIS — R0602 Shortness of breath: Secondary | ICD-10-CM | POA: Diagnosis not present

## 2019-01-10 DIAGNOSIS — Z66 Do not resuscitate: Secondary | ICD-10-CM | POA: Diagnosis not present

## 2019-01-10 DIAGNOSIS — I2510 Atherosclerotic heart disease of native coronary artery without angina pectoris: Secondary | ICD-10-CM

## 2019-01-10 DIAGNOSIS — R188 Other ascites: Secondary | ICD-10-CM | POA: Diagnosis not present

## 2019-01-10 DIAGNOSIS — G7001 Myasthenia gravis with (acute) exacerbation: Secondary | ICD-10-CM | POA: Diagnosis not present

## 2019-01-10 DIAGNOSIS — N184 Chronic kidney disease, stage 4 (severe): Secondary | ICD-10-CM | POA: Diagnosis not present

## 2019-01-10 DIAGNOSIS — J90 Pleural effusion, not elsewhere classified: Secondary | ICD-10-CM | POA: Diagnosis not present

## 2019-01-10 LAB — COMPREHENSIVE METABOLIC PANEL
ALBUMIN: 3.6 g/dL (ref 3.5–5.0)
ALT: 79 U/L — ABNORMAL HIGH (ref 0–44)
AST: 163 U/L — ABNORMAL HIGH (ref 15–41)
Alkaline Phosphatase: 170 U/L — ABNORMAL HIGH (ref 38–126)
Anion gap: 11 (ref 5–15)
BUN: 23 mg/dL (ref 8–23)
CO2: 26 mmol/L (ref 22–32)
Calcium: 9.4 mg/dL (ref 8.9–10.3)
Chloride: 102 mmol/L (ref 98–111)
Creatinine, Ser: 2.15 mg/dL — ABNORMAL HIGH (ref 0.61–1.24)
GFR calc Af Amer: 33 mL/min — ABNORMAL LOW (ref >60)
GFR calc non Af Amer: 29 mL/min — ABNORMAL LOW (ref >60)
GLUCOSE: 148 mg/dL — AB (ref 70–99)
Potassium: 5 mmol/L (ref 3.5–5.1)
SODIUM: 139 mmol/L (ref 135–145)
Total Bilirubin: 1.1 mg/dL (ref 0.3–1.2)
Total Protein: 7.4 g/dL (ref 6.5–8.1)

## 2019-01-10 LAB — PROTIME-INR
INR: 1.2 (ref 0.8–1.2)
Prothrombin Time: 15.5 seconds — ABNORMAL HIGH (ref 11.4–15.2)

## 2019-01-10 NOTE — Telephone Encounter (Signed)
-----   Message from Lucilla Lame, MD sent at 01/10/2019  3:18 PM EST ----- The patient that his liver enzymes are higher than they were previously and this could be caused by liver congestion.  He is renal better has improved slightly since his discharge from the hospital.

## 2019-01-10 NOTE — Telephone Encounter (Signed)
Pt's wife, [name] notified of Chest xray and lab results.

## 2019-01-10 NOTE — Progress Notes (Signed)
Primary Care Physician: [name], MD  Primary Gastroenterologist:  Dr. Lucilla Lame  Chief Complaint  Patient presents with  . follow up ER    HPI: Mason Wilson. is a 78 y.o. male here for follow-up after recently being in the hospital for ascites and worsening renal function.  The patient was seen by Dr. Alice Reichert.  After discharge the patient was told to follow-up with me as an outpatient. The patient reports that he is still short of breath and the shortness of breath did not decrease months after the paracentesis.  The patient had a chest x-ray that showed him to have congestive heart failure.  He was told that it was because of the rapid infusion of fluids in the emergency department. He reports the last time he had this much trouble breathing was when he was diagnosed with myasthenia gravis a couple years ago.  The patient has been followed with nephrology and states he has had labs done by them although I have no access to these labs.  Current Outpatient Medications  Medication Sig Dispense Refill  . acetaminophen (TYLENOL) 325 MG tablet Place 2 tablets (650 mg total) into feeding tube every 6 (six) hours as needed for mild pain (or Fever >/= 101). 10 tablet 0  . Calcium Carbonate-Vit D-Min (CALCIUM 1200 PO) Take 1,200 mg by mouth.    . cefUROXime (CEFTIN) 250 MG tablet Take 1 tablet (250 mg total) by mouth 2 (two) times daily. 90 tablet 0  . furosemide (LASIX) 20 MG tablet Take 1 tablet (20 mg total) by mouth daily. 30 tablet 0  . furosemide (LASIX) 80 MG tablet Take 1 tablet (80 mg total) by mouth daily. 30 tablet 1  . glipiZIDE (GLUCOTROL) 5 MG tablet Take 1 tablet (5 mg total) by mouth daily before breakfast. 90 tablet 4  . metoprolol succinate (TOPROL-XL) 50 MG 24 hr tablet Take 1 tablet (50 mg total) by mouth daily. Take with or immediately following a meal. 90 tablet 4  . Multiple Vitamin (MULTIVITAMIN WITH MINERALS) TABS tablet Take 1 tablet by mouth daily. 30  tablet 0  . nystatin (MYCOSTATIN/NYSTOP) powder Apply topically 2 (two) times daily. Apply to inguinal area twice daily. 15 g 0  . nystatin-triamcinolone ointment (MYCOLOG) Apply 1 application topically 2 (two) times daily. 30 g 0  . omeprazole (PRILOSEC) 40 MG capsule Take 40 mg by mouth daily.     Marland Kitchen pyridostigmine (MESTINON) 60 MG tablet Take 60 mg by mouth 4 (four) times daily.    . simvastatin (ZOCOR) 40 MG tablet Take 1 tablet (40 mg total) by mouth daily at 6 PM. 90 tablet 4  . spironolactone (ALDACTONE) 100 MG tablet Take 1 tablet (100 mg total) by mouth daily. 30 tablet 1  . tamsulosin (FLOMAX) 0.4 MG CAPS capsule TAKE 1 CAPSULE(0.4 MG) BY MOUTH DAILY 90 capsule 3   No current facility-administered medications for this visit.     Allergies as of 01/10/2019 - Review Complete 01/10/2019  Allergen Reaction Noted  . Altace [ramipril] Cough 04/03/2015  . Ciprofloxacin Other (See Comments) 12/18/2018    ROS:  General: Negative for anorexia, weight loss, fever, chills, fatigue, weakness. ENT: Negative for hoarseness, difficulty swallowing , nasal congestion. CV: Negative for chest pain, angina, palpitations, dyspnea on exertion, peripheral edema.  Respiratory: Negative for dyspnea at rest, dyspnea on exertion, cough, sputum, wheezing.  GI: See history of present illness. GU:  Negative for dysuria, hematuria, urinary incontinence, urinary frequency, nocturnal  urination.  Endo: Negative for unusual weight change.    Physical Examination:   BP 134/78   Pulse (!) 130   Ht 5\' 9"  (1.753 m)   Wt 222 lb 9.6 oz (101 kg)   BMI 32.87 kg/m   General: Well-nourished, well-developed in no acute distress.  Eyes: No icterus. Conjunctivae pink. Mouth: Oropharyngeal mucosa moist and pink , no lesions erythema or exudate. Lungs: Clear to auscultation bilaterally. Non-labored. Heart: Regular rate and rhythm, no murmurs rubs or gallops.  Abdomen: Bowel sounds are normal, nontender,  nondistended, no hepatosplenomegaly or masses, no abdominal bruits or hernia , no rebound or guarding.   Extremities: No lower extremity edema. No clubbing or deformities. Neuro: Alert and oriented x 3.  Grossly intact. Skin: Warm and dry, no jaundice.   Psych: Alert and cooperative, normal mood and affect.  Labs:    Imaging Studies: Ct Abdomen Pelvis Wo Contrast  Result Date: 12/15/2018 CLINICAL DATA:  History of recent TURP. EXAM: CT ABDOMEN AND PELVIS WITHOUT CONTRAST TECHNIQUE: Multidetector CT imaging of the abdomen and pelvis was performed following the standard protocol without IV contrast. COMPARISON:  CT scan 11/26/2018 FINDINGS: Lower chest: The heart is upper limits of normal in size and stable. Stable three-vessel coronary artery calcifications. There is a small left pleural effusion with overlying atelectasis. Hepatobiliary: Stable advanced cirrhotic changes involving the liver but no obvious hepatic mass. The gallbladder is grossly normal. No common bile duct dilatation. Pancreas: No mass, inflammation or ductal dilatation. Moderate atrophy. Spleen: Stable splenomegaly. Adrenals/Urinary Tract: The adrenal glands and kidneys are unremarkable and stable. The bladder contains a Foley catheter. 200 cc of contrast was put in the bladder. No asymmetric bladder wall thickening or bladder mass is identified. The bladder wall is intact. There is extravasation of contrast into the prostate gland and 2 areas 1 in the left mid gland and 1 in the lower right gland. These are areas of previous cystic appearing cavities are abscesses on prior CT scan. Stomach/Bowel: The stomach, duodenum, small bowel and colon are grossly normal. No acute inflammatory changes, mass lesions or obstructive findings. Vascular/Lymphatic: Stable aortic calcifications. Stable scattered mesenteric and retroperitoneal lymph nodes. Reproductive: Expected surgical changes involving the prostate gland from the recent TURP.  Extravasation of contrast into the prostate gland as stated above. Other: Large volume abdominal/pelvic ascites and diffuse body wall edema. Musculoskeletal: No significant bony findings. IMPRESSION: 1. The bladder is intact.  No bladder rupture or leakage. 2. Two areas of extravasation of contrast into the prostate gland. I assume this is normal after a TURP. 3. Advanced cirrhotic changes involving the liver with associated splenomegaly and large volume abdominal/pelvic ascites. Electronically Signed   By: Marijo Sanes M.D.   On: 12/15/2018 16:20   Dg Chest 2 View  Result Date: 01/10/2019 CLINICAL DATA:  Cirrhosis of liver.  Short of breath EXAM: CHEST - 2 VIEW COMPARISON:  11/28/2018 FINDINGS: Heart size and vascularity normal. Minimal bilateral pleural effusions. Lungs are now clear with interval clearing of bilateral airspace disease seen on the prior study. IMPRESSION: Significant improvement in aeration since the prior study. Lungs are now clear. Minimal pleural effusion bilaterally. Electronically Signed   By: Franchot Gallo M.D.   On: 01/10/2019 12:00   US Renal  Result Date: 01/01/2019 CLINICAL DATA:  Hydronephrosis. EXAM: RENAL / URINARY TRACT ULTRASOUND COMPLETE COMPARISON:  CT scan December 15, 2018 FINDINGS: Right Kidney: Renal measurements: 12.4 x 6.5 x 7.3 cm = volume: 307.2 mL. Contains a 2.5  cm cyst in the lateral lower pole. Mild cortical thinning. Left Kidney: Renal measurements: 12.3 x 6.0 x 6.0 cm = volume: 229.0 mL. Mild cortical thinning. No mass. Bladder: Appears normal for degree of bladder distention. IMPRESSION: 1. There is a 2.5 cm cyst in the lateral lower pole of the right kidney. Both kidneys demonstrate mild cortical thinning. No hydronephrosis. 2. No other abnormalities. Electronically Signed   By: Dorise Bullion III M.D   On: 01/01/2019 09:45   US Renal  Result Date: 12/15/2018 CLINICAL DATA:  78 y/o  M; hydronephrosis. EXAM: RENAL / URINARY TRACT ULTRASOUND COMPLETE  COMPARISON:  11/26/2018 CT abdomen and pelvis. FINDINGS: Right Kidney: Renal measurements: 13.0 x 6.7 x 5.7 cm = volume: 259 mL . Echogenicity within normal limits. No mass or hydronephrosis visualized. Cortical atrophy. Lower pole simple cyst measuring up to 2.8 cm. Left Kidney: Renal measurements: 12.1 x 6.9 x 5.2 cm = volume: 225 mL. Echogenicity within normal limits. No mass or hydronephrosis visualized. Cortical atrophy. Interpolar echogenic shadowing stone measuring up to 9 mm. Bladder: Not visualized.  Ascites. IMPRESSION: 1. Hydronephrosis. Mild cortical atrophy of bilateral kidneys. Left kidney interpolar 9 mm stone. 2. Ascites. Electronically Signed   By: Kristine Garbe M.D.   On: 12/15/2018 13:59   US Venous Img Lower Bilateral  Result Date: 12/16/2018 CLINICAL DATA:  78 year old male with bilateral lower extremity edema EXAM: BILATERAL LOWER EXTREMITY VENOUS DOPPLER ULTRASOUND TECHNIQUE: Gray-scale sonography with graded compression, as well as color Doppler and duplex ultrasound were performed to evaluate the lower extremity deep venous systems from the level of the common femoral vein and including the common femoral, femoral, profunda femoral, popliteal and calf veins including the posterior tibial, peroneal and gastrocnemius veins when visible. The superficial great saphenous vein was also interrogated. Spectral Doppler was utilized to evaluate flow at rest and with distal augmentation maneuvers in the common femoral, femoral and popliteal veins. COMPARISON:  None. FINDINGS: RIGHT LOWER EXTREMITY Common Femoral Vein: No evidence of thrombus. Normal compressibility, respiratory phasicity and response to augmentation. Saphenofemoral Junction: No evidence of thrombus. Normal compressibility and flow on color Doppler imaging. Profunda Femoral Vein: No evidence of thrombus. Normal compressibility and flow on color Doppler imaging. Femoral Vein: No evidence of thrombus. Normal compressibility,  respiratory phasicity and response to augmentation. Popliteal Vein: No evidence of thrombus. Normal compressibility, respiratory phasicity and response to augmentation. Calf Veins: No evidence of thrombus. Normal compressibility and flow on color Doppler imaging. Superficial Great Saphenous Vein: No evidence of thrombus. Normal compressibility. Venous Reflux:  None. Other Findings:  None. LEFT LOWER EXTREMITY Common Femoral Vein: No evidence of thrombus. Normal compressibility, respiratory phasicity and response to augmentation. Saphenofemoral Junction: No evidence of thrombus. Normal compressibility and flow on color Doppler imaging. Profunda Femoral Vein: No evidence of thrombus. Normal compressibility and flow on color Doppler imaging. Femoral Vein: No evidence of thrombus. Normal compressibility, respiratory phasicity and response to augmentation. Popliteal Vein: No evidence of thrombus. Normal compressibility, respiratory phasicity and response to augmentation. Calf Veins: No evidence of thrombus. Normal compressibility and flow on color Doppler imaging. Superficial Great Saphenous Vein: No evidence of thrombus. Normal compressibility. Venous Reflux:  None. Other Findings:  None. IMPRESSION: No evidence of deep venous thrombosis. Electronically Signed   By: Jacqulynn Cadet M.D.   On: 12/16/2018 09:42   US Paracentesis  Result Date: 01/08/2019 INDICATION: Patient with history of cirrhosis and recurrent ascites. Request for diagnostic and therapeutic paracentesis today in IR. EXAM: ULTRASOUND GUIDED DIAGNOSTIC  AND THERAPEUTIC PARACENTESIS MEDICATIONS: 10 mL 1% lidocaine. COMPLICATIONS: None immediate. PROCEDURE: Informed written consent was obtained from the patient after a discussion of the risks, benefits and alternatives to treatment. A timeout was performed prior to the initiation of the procedure. Initial ultrasound scanning demonstrates a large amount of ascites within the right lower abdominal  quadrant. The right lower abdomen was prepped and draped in the usual sterile fashion. 1% lidocaine was used for local anesthesia. Following this, a 6 Fr Safe-T-Centesis catheter was introduced. An ultrasound image was saved for documentation purposes. The paracentesis was performed. The catheter was removed and a dressing was applied. The patient tolerated the procedure well without immediate post procedural complication. FINDINGS: A total of approximately 4.1 L of clear yellow fluid was removed. Samples were sent to the laboratory as requested by the clinical team. IMPRESSION: Successful ultrasound-guided paracentesis yielding 4.1 liters of peritoneal fluid. Read by Candiss Norse, PA-C Electronically Signed   By: Lucrezia Europe M.D.   On: 01/08/2019 14:21   US Paracentesis  Result Date: 12/19/2018 INDICATION: Recurrent symptomatic ascites. Please perform ultrasound-guided paracentesis for therapeutic purposes. EXAM: ULTRASOUND-GUIDED PARACENTESIS COMPARISON:  Ultrasound-guided paracentesis 12/17/2018 (yielding 4.8 L of peritoneal fluid). CT abdomen pelvis-12/15/2018; 11/26/2018 MEDICATIONS: None. COMPLICATIONS: None immediate. TECHNIQUE: Informed written consent was obtained from the patient after a discussion of the risks, benefits and alternatives to treatment. A timeout was performed prior to the initiation of the procedure. Initial ultrasound scanning demonstrates a moderate amount of ascites within the right lower abdominal quadrant. The right lower abdomen was prepped and draped in the usual sterile fashion. 1% lidocaine with epinephrine was used for local anesthesia. An ultrasound image was saved for documentation purposed. An 8 Fr Safe-T-Centesis catheter was introduced. The paracentesis was performed. The catheter was removed and a dressing was applied. The patient tolerated the procedure well without immediate post procedural complication. FINDINGS: A total of approximately 3 liters of serous fluid  was removed. IMPRESSION: Successful ultrasound-guided paracentesis yielding 3 liters of peritoneal fluid. Electronically Signed   By: Sandi Mariscal M.D.   On: 12/19/2018 12:08   US Paracentesis  Result Date: 12/17/2018 INDICATION: Patient with history of cirrhosis, now with ascites. Request is made for diagnostic and therapeutic paracentesis. EXAM: ULTRASOUND GUIDED DIAGNOSTIC AND THERAPEUTIC PARACENTESIS MEDICATIONS: 10 mL 1% lidocaine COMPLICATIONS: None immediate. PROCEDURE: Informed written consent was obtained from the patient after a discussion of the risks, benefits and alternatives to treatment. A timeout was performed prior to the initiation of the procedure. Initial ultrasound scanning demonstrates a moderate amount of ascites within the right lower abdominal quadrant. The right lower abdomen was prepped and draped in the usual sterile fashion. 1% lidocaine was used for local anesthesia. Following this, a Safe-T-Centesis catheter was introduced. An ultrasound image was saved for documentation purposes. The paracentesis was performed. The catheter was removed and a dressing was applied. The patient tolerated the procedure well without immediate post procedural complication. FINDINGS: A total of approximately 4.8 liters of yellow fluid was removed. Samples were sent to the laboratory as requested by the clinical team. IMPRESSION: Successful ultrasound-guided diagnostic and therapeutic paracentesis yielding 4.8 liters of peritoneal fluid. Read by: Brynda Greathouse PA-C Electronically Signed   By: Corrie Mckusick D.O.   On: 12/17/2018 14:53    Assessment and Plan:   Mason Wilson. 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  The patient has refractory ascites with worsening renal failure when going up on the diuretics. The patient  had labs sent off today to reassess his meld score. The INR is not back yet but based on his previous INR he is a meld score of between 16 and 18.  The patient has  been encouraged again to be on a low-salt diet.  The patient had a stat chest x-ray sent off that showed significant improvement in aeration since the prior study.  Lungs are now clear.  Minimal pleural effusion bilaterally.  The patient will be sent for evaluation for TIPS since his shortness of breath is likely due to his tense ascites and repeat paracentesis has not decreased his comfort level.  The patient has been explained the risks of a TIPS procedure including hepatic encephalopathy.  The patient and his wife want to be evaluated and consider having a TIPS procedure.  Lucilla Lame, MD. Mar[facility] Regal   Note: This dictation was prepared with Dragon dictation along with smaller phrase technology. Any transcriptional errors that result from this process are unintentional.

## 2019-01-10 NOTE — Telephone Encounter (Signed)
-----   Message from Lucilla Lame, MD sent at 01/10/2019  3:19 PM EST ----- The patient's chest x-ray shows significant improvement in the lungs including no sign of enlarged heart or congestive heart failure.  The lungs are also clear without any sign of pneumonia or fluid in the lungs.

## 2019-01-11 ENCOUNTER — Encounter: Payer: Self-pay | Admitting: *Deleted

## 2019-01-11 ENCOUNTER — Other Ambulatory Visit: Payer: Self-pay | Admitting: Interventional Radiology

## 2019-01-11 ENCOUNTER — Ambulatory Visit
Admission: RE | Admit: 2019-01-11 | Discharge: 2019-01-11 | Disposition: A | Discharge status: Home / Self Care | Payer: Medicare Other | Source: Ambulatory Visit | Attending: Gastroenterology | Admitting: Gastroenterology

## 2019-01-11 DIAGNOSIS — K7581 Nonalcoholic steatohepatitis (NASH): Secondary | ICD-10-CM | POA: Diagnosis not present

## 2019-01-11 DIAGNOSIS — R188 Other ascites: Secondary | ICD-10-CM | POA: Diagnosis not present

## 2019-01-11 DIAGNOSIS — G7000 Myasthenia gravis without (acute) exacerbation: Secondary | ICD-10-CM | POA: Diagnosis not present

## 2019-01-11 DIAGNOSIS — K7460 Unspecified cirrhosis of liver: Secondary | ICD-10-CM

## 2019-01-11 HISTORY — PX: IR RADIOLOGIST EVAL & MGMT: IMG5224

## 2019-01-11 NOTE — Consult Note (Signed)
  Chief Complaint: Patient was seen in consultation today for possible TIPS placement at the request of Wilson,Darren  Referring Physician(s): Wilson,Darren  History of Present Illness: Mason D Mota Jr. is a 77 y.o. male with a history of NASH and cirrhosis with recent accelerated development of symptomatic ascites causing dyspnea.  He is status post paracentesis procedures on 12/17/2018 yielding 4.8 L of ascites, 12/19/2018 yielding 3 L and 01/08/2019 yielding 4.1 L.  Mason Wilson has been hospitalized twice recently with significant sepsis in January felt secondary to a prostatic abscess.  At that time he was also in respiratory failure and had to be intubated.  He underwent transurethral drainage of the prostatic abscess on 11/26/2018.  He was readmitted on 12/15/2018 and underwent additional transurethral prostate resection on 12/17/2018.  At that time he was also noted to be in acute renal failure with acute worsening of renal insufficiency.  Creatinine was as high as 2.57 on 12/27/2018.  Latest creatinine is 2.15.  Prior baseline was around 1.21 in November of last year.  He is followed by Dr. Kolluru.  Paracentesis was performed twice during that hospitalization and the patient states that these procedures did result in improvement in breathing.  He was discharged on both Lasix and spironolactone.  He states that diuretics have helped reduce lower extremity edema.  He did require additional paracentesis on 01/08/2019 after which he states that he did not feel as much relief of dyspnea.  He saw Dr. Wohl yesterday who has referred Mason Wilson for consideration of a TIPS procedure to treat refractory ascites.  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  He has not had a history of variceal bleeding or banding.  He does not have a history of prior encephalopathy or need for  lactulose.  The patient does also have a history of myasthenia gravis 2 years ago requiring 4 plasma exchanges and prior treatment with prednisone and Mestinon.  He remains on Mestinon and recently followed up with Dr. Shah.  He has a history of coronary stent placement in 1999 and prior CHF.  Echocardiography on 12/16/2018 showed estimated left ventricular ejection fraction of greater than 65%.  There was noted to be some dilatation of right-sided heart chambers without significant elevated right heart pressures.  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  Past Medical History:  Diagnosis Date  . Allergy   . Arthritis    "everywhere' - worse in back  . Body mass index 39.0-39.9, adult   . CAD (coronary artery disease)   . Chronic back pain   . Cirrhosis, non-alcoholic (HCC)   . Diabetes mellitus without complication (HCC)   . Elevated liver enzymes   . GERD (gastroesophageal reflux disease)   . Heart murmur   . Hyperlipidemia   . Hypertension   . Hypogonadism in male   . Iron deficiency anemia due to chronic blood loss   . Melanoma (HCC)    melanoma   . Melanoma in situ of right shoulder (HCC)   . Myasthenia gravis (HCC) 01/2018  . Renal stones   . Sleep apnea    CPAP  . Uses hearing aid    doesn't wear    Past Surgical History:  Procedure Laterality Date  . ANGIOPLASTY  1999   3 stents  .   CARDIAC CATHETERIZATION     stents placed  . CARPAL TUNNEL RELEASE     x2  . CATARACT EXTRACTION, BILATERAL    . COLONOSCOPY WITH PROPOFOL N/A 07/03/2018   Procedure: COLONOSCOPY WITH PROPOFOL;  Surgeon: Wilson, Darren, MD;  Location: [facility];  Service: Endoscopy;  Laterality: N/A;  . ESOPHAGOGASTRODUODENOSCOPY (EGD) WITH PROPOFOL N/A 09/08/2015   Procedure: ESOPHAGOGASTRODUODENOSCOPY (EGD) WITH PROPOFOL;  Surgeon: Darren Wohl, MD;  Location: [facility];  Service: Endoscopy;  Laterality: N/A;  CPAP Diabetic - oral meds  . ESOPHAGOGASTRODUODENOSCOPY (EGD) WITH PROPOFOL N/A 07/03/2018   Procedure: ESOPHAGOGASTRODUODENOSCOPY (EGD) WITH PROPOFOL;  Surgeon: Wilson, Darren, MD;  Location: [facility];  Service: Endoscopy;  Laterality: N/A;  diabetic - oral meds sleep apnea  . EYE SURGERY  cataracts  . IR RADIOLOGIST EVAL & MGMT  01/11/2019  . LIVER BIOPSY    . POLYPECTOMY N/A 07/03/2018   Procedure: POLYPECTOMY;  Surgeon: Wilson, Darren, MD;  Location: [facility];  Service: Endoscopy;  Laterality: N/A;  . SKIN LESION EXCISION  1998  . SKIN LESION EXCISION     skin cancer removed on forehead in June 2018  . TONSILLECTOMY     age 5  . TRANSURETHRAL RESECTION OF PROSTATE N/A 11/26/2018   Procedure: TRANSURETHRAL RESECTION OF THE PROSTATE (TURP);  Surgeon: Wrenn, John, MD;  Location: [facility];  Service: Urology;  Laterality: N/A;  . TRANSURETHRAL RESECTION OF PROSTATE N/A 12/17/2018   Procedure: TRANSURETHRAL RESECTION OF THE PROSTATE (TURP),cystoscopy,clot evacuation;  Surgeon: Bell, Eugene D III, MD;  Location: [facility];  Service: Urology;  Laterality: N/A;    Allergies: Altace [ramipril] and Ciprofloxacin  Medications: Prior to Admission medications   Medication Sig Start Date End Date Taking? Authorizing Provider  acetaminophen (TYLENOL) 325 MG tablet Place 2 tablets (650 mg total) into feeding tube every 6 (six) hours as needed for mild pain (or Fever >/= 101). 11/30/18   Vachhani, Vaibhavkumar, MD  Calcium Carbonate-Vit D-Min (CALCIUM 1200 PO) Take 1,200 mg by mouth.    [provider]  cefUROXime (CEFTIN) 250 MG tablet Take 1 tablet (250 mg total) by mouth 2 (two) times daily. 11/30/18 01/14/19  Vachhani, Vaibhavkumar, MD  furosemide (LASIX) 20 MG tablet Take 1 tablet (20 mg total) by mouth daily. 12/26/18   Lane, Rachel Elizabeth, PA-C  furosemide (LASIX) 80 MG tablet Take 1 tablet (80 mg total) by mouth daily. 12/21/18    Sainani, Vivek J, MD  glipiZIDE (GLUCOTROL) 5 MG tablet Take 1 tablet (5 mg total) by mouth daily before breakfast. 09/05/18   Crissman, Mark A, MD  metoprolol succinate (TOPROL-XL) 50 MG 24 hr tablet Take 1 tablet (50 mg total) by mouth daily. Take with or immediately following a meal. 09/05/18   Crissman, Mark A, MD  Multiple Vitamin (MULTIVITAMIN WITH MINERALS) TABS tablet Take 1 tablet by mouth daily. 12/01/18   Vachhani, Vaibhavkumar, MD  nystatin (MYCOSTATIN/NYSTOP) powder Apply topically 2 (two) times daily. Apply to inguinal area twice daily. 12/20/18   Sainani, Vivek J, MD  nystatin-triamcinolone ointment (MYCOLOG) Apply 1 application topically 2 (two) times daily. 01/08/19   Sninsky, Brian C, MD  omeprazole (PRILOSEC) 40 MG capsule Take 40 mg by mouth daily.  11/23/18   [provider]  pyridostigmine (MESTINON) 60 MG tablet Take 60 mg by mouth 4 (four) times daily.    [provider]  simvastatin (ZOCOR) 40 MG tablet Take 1 tablet (40 mg total) by mouth daily at 6 PM. 09/05/18     [name], MD  spironolactone (ALDACTONE) 100 MG tablet Take 1 tablet (100 mg total) by mouth daily. 12/21/18 02/19/19  Henreitta Leber, MD  tamsulosin (FLOMAX) 0.4 MG CAPS capsule TAKE 1 CAPSULE(0.4 MG) BY MOUTH DAILY 08/09/18   McGowan, Hunt Oris, PA-C     Family History  Problem Relation Age of Onset  . Thyroid disease Mother   . Alzheimer's disease Mother   . Heart disease Mother   . Cancer Father 82       testicular  . Heart disease Father   . Hypertension Father   . Heart attack Father   . Kidney disease Neg Hx   . Prostate cancer Neg Hx   . Kidney cancer Neg Hx   . Bladder Cancer Neg Hx     Social History   Socioeconomic History  . Marital status: Married    Spouse name: Not on file  . Number of children: Not on file  . Years of education: Not on file  . Highest education level: Not on file  Occupational History  . Not on file  Social Needs  . Financial resource  strain: Not hard at all  . Food insecurity:    Worry: Never true    Inability: Never true  . Transportation needs:    Medical: No    Non-medical: No  Tobacco Use  . Smoking status: Former Smoker    Packs/day: 3.00    Years: 0.00    Pack years: 0.00    Types: Cigarettes    Last attempt to quit: 04/02/1970    Years since quitting: 48.8  . Smokeless tobacco: Current User    Types: Chew  . Tobacco comment: approx quit in 1971   Substance and Sexual Activity  . Alcohol use: No    Alcohol/week: 0.0 standard drinks    Comment: stopped in 1996  . Drug use: No  . Sexual activity: Yes    Partners: Female  Lifestyle  . Physical activity:    Days per week: 0 days    Minutes per session: 0 min  . Stress: Not at all  Relationships  . Social connections:    Talks on phone: More than three times a week    Gets together: Once a week    Attends religious service: Never    Active member of club or organization: No    Attends meetings of clubs or organizations: Never    Relationship status: Married  Other Topics Concern  . Not on file  Social History Narrative  . Not on file    Review of Systems: A 12 point ROS discussed and pertinent positives are indicated in the HPI above.  All other systems are negative.  Review of Systems  Constitutional: Positive for activity change, appetite change and fatigue.  HENT: Negative.   Respiratory: Positive for shortness of breath. Negative for cough, choking and chest tightness.   Cardiovascular: Positive for leg swelling. Negative for chest pain and palpitations.  Gastrointestinal: Positive for abdominal distention, abdominal pain and diarrhea. Negative for anal bleeding, blood in stool, constipation, nausea, rectal pain and vomiting.  Genitourinary: Negative.   Musculoskeletal: Negative.   Neurological: Negative.     Vital Signs: BP 116/67   Pulse (!) 118   Temp 98.1 F (36.7 C) (Oral)   Resp (!) 22   Ht 5' 9" (1.753 m)   Wt 100.7 kg    SpO2 98%   BMI 32.78 kg/m   Physical Exam Vitals signs reviewed.  Constitutional:      Appearance: He is not diaphoretic.     Comments: Appears uncomfortable and mildly short of breath at rest.  HENT:     Head: Normocephalic and atraumatic.     Mouth/Throat:     Mouth: Mucous membranes are moist.  Neck:     Musculoskeletal: Neck supple.     Comments: No visible JVD. Cardiovascular:     Rate and Rhythm: Normal rate and regular rhythm.     Heart sounds: Normal heart sounds. No murmur. No friction rub. No gallop.   Pulmonary:     Effort: No respiratory distress.     Breath sounds: Normal breath sounds. No stridor. No wheezing, rhonchi or rales.  Abdominal:     General: There is distension.     Palpations: There is no mass.     Tenderness: There is no abdominal tenderness. There is no guarding or rebound.     Hernia: No hernia is present.     Comments: Tense abdomen; positive fluid wave.  Musculoskeletal:     Comments: Mild bilateral lower extremity edema.  Lymphadenopathy:     Cervical: No cervical adenopathy.  Skin:    General: Skin is warm and dry.  Neurological:     Mental Status: He is alert and oriented to person, place, and time.     Imaging: Ct Abdomen Pelvis Wo Contrast  Result Date: 12/15/2018 CLINICAL DATA:  History of recent TURP. EXAM: CT ABDOMEN AND PELVIS WITHOUT CONTRAST TECHNIQUE: Multidetector CT imaging of the abdomen and pelvis was performed following the standard protocol without IV contrast. COMPARISON:  CT scan 11/26/2018 FINDINGS: Lower chest: The heart is upper limits of normal in size and stable. Stable three-vessel coronary artery calcifications. There is a small left pleural effusion with overlying atelectasis. Hepatobiliary: Stable advanced cirrhotic changes involving the liver but no obvious hepatic mass. The gallbladder is grossly normal. No common bile duct dilatation. Pancreas: No mass, inflammation or ductal dilatation. Moderate atrophy. Spleen:  Stable splenomegaly. Adrenals/Urinary Tract: The adrenal glands and kidneys are unremarkable and stable. The bladder contains a Foley catheter. 200 cc of contrast was put in the bladder. No asymmetric bladder wall thickening or bladder mass is identified. The bladder wall is intact. There is extravasation of contrast into the prostate gland and 2 areas 1 in the left mid gland and 1 in the lower right gland. These are areas of previous cystic appearing cavities are abscesses on prior CT scan. Stomach/Bowel: The stomach, duodenum, small bowel and colon are grossly normal. No acute inflammatory changes, mass lesions or obstructive findings. Vascular/Lymphatic: Stable aortic calcifications. Stable scattered mesenteric and retroperitoneal lymph nodes. Reproductive: Expected surgical changes involving the prostate gland from the recent TURP. Extravasation of contrast into the prostate gland as stated above. Other: Large volume abdominal/pelvic ascites and diffuse body wall edema. Musculoskeletal: No significant bony findings. IMPRESSION: 1. The bladder is intact.  No bladder rupture or leakage. 2. Two areas of extravasation of contrast into the prostate gland. I assume this is normal after a TURP. 3. Advanced cirrhotic changes involving the liver with associated splenomegaly and large volume abdominal/pelvic ascites. Electronically Signed   By: P.  Gallerani M.D.   On: 12/15/2018 16:20   Dg Chest 2 View  Result Date: 01/10/2019 CLINICAL DATA:  Cirrhosis of liver.  Short of breath EXAM: CHEST - 2 VIEW COMPARISON:  11/28/2018 FINDINGS: Heart size and vascularity normal. Minimal bilateral pleural effusions. Lungs are now clear with interval clearing of bilateral airspace disease   seen on the prior study. IMPRESSION: Significant improvement in aeration since the prior study. Lungs are now clear. Minimal pleural effusion bilaterally. Electronically Signed   By: Franchot Gallo M.D.   On: 01/10/2019 12:00   US  Renal  Result Date: 01/01/2019 CLINICAL DATA:  Hydronephrosis. EXAM: RENAL / URINARY TRACT ULTRASOUND COMPLETE COMPARISON:  CT scan December 15, 2018 FINDINGS: Right Kidney: Renal measurements: 12.4 x 6.5 x 7.3 cm = volume: 307.2 mL. Contains a 2.5 cm cyst in the lateral lower pole. Mild cortical thinning. Left Kidney: Renal measurements: 12.3 x 6.0 x 6.0 cm = volume: 229.0 mL. Mild cortical thinning. No mass. Bladder: Appears normal for degree of bladder distention. IMPRESSION: 1. There is a 2.5 cm cyst in the lateral lower pole of the right kidney. Both kidneys demonstrate mild cortical thinning. No hydronephrosis. 2. No other abnormalities. Electronically Signed   By: Dorise Bullion III M.D   On: 01/01/2019 09:45   US Renal  Result Date: 12/15/2018 CLINICAL DATA:  78 y/o  M; hydronephrosis. EXAM: RENAL / URINARY TRACT ULTRASOUND COMPLETE COMPARISON:  11/26/2018 CT abdomen and pelvis. FINDINGS: Right Kidney: Renal measurements: 13.0 x 6.7 x 5.7 cm = volume: 259 mL . Echogenicity within normal limits. No mass or hydronephrosis visualized. Cortical atrophy. Lower pole simple cyst measuring up to 2.8 cm. Left Kidney: Renal measurements: 12.1 x 6.9 x 5.2 cm = volume: 225 mL. Echogenicity within normal limits. No mass or hydronephrosis visualized. Cortical atrophy. Interpolar echogenic shadowing stone measuring up to 9 mm. Bladder: Not visualized.  Ascites. IMPRESSION: 1. Hydronephrosis. Mild cortical atrophy of bilateral kidneys. Left kidney interpolar 9 mm stone. 2. Ascites. Electronically Signed   By: Kristine Garbe M.D.   On: 12/15/2018 13:59   US Venous Img Lower Bilateral  Result Date: 12/16/2018 CLINICAL DATA:  78 year old male with bilateral lower extremity edema EXAM: BILATERAL LOWER EXTREMITY VENOUS DOPPLER ULTRASOUND TECHNIQUE: Gray-scale sonography with graded compression, as well as color Doppler and duplex ultrasound were performed to evaluate the lower extremity deep venous systems from  the level of the common femoral vein and including the common femoral, femoral, profunda femoral, popliteal and calf veins including the posterior tibial, peroneal and gastrocnemius veins when visible. The superficial great saphenous vein was also interrogated. Spectral Doppler was utilized to evaluate flow at rest and with distal augmentation maneuvers in the common femoral, femoral and popliteal veins. COMPARISON:  None. FINDINGS: RIGHT LOWER EXTREMITY Common Femoral Vein: No evidence of thrombus. Normal compressibility, respiratory phasicity and response to augmentation. Saphenofemoral Junction: No evidence of thrombus. Normal compressibility and flow on color Doppler imaging. Profunda Femoral Vein: No evidence of thrombus. Normal compressibility and flow on color Doppler imaging. Femoral Vein: No evidence of thrombus. Normal compressibility, respiratory phasicity and response to augmentation. Popliteal Vein: No evidence of thrombus. Normal compressibility, respiratory phasicity and response to augmentation. Calf Veins: No evidence of thrombus. Normal compressibility and flow on color Doppler imaging. Superficial Great Saphenous Vein: No evidence of thrombus. Normal compressibility. Venous Reflux:  None. Other Findings:  None. LEFT LOWER EXTREMITY Common Femoral Vein: No evidence of thrombus. Normal compressibility, respiratory phasicity and response to augmentation. Saphenofemoral Junction: No evidence of thrombus. Normal compressibility and flow on color Doppler imaging. Profunda Femoral Vein: No evidence of thrombus. Normal compressibility and flow on color Doppler imaging. Femoral Vein: No evidence of thrombus. Normal compressibility, respiratory phasicity and response to augmentation. Popliteal Vein: No evidence of thrombus. Normal compressibility, respiratory phasicity and response to augmentation. Calf Veins: No  evidence of thrombus. Normal compressibility and flow on color Doppler imaging. Superficial  Great Saphenous Vein: No evidence of thrombus. Normal compressibility. Venous Reflux:  None. Other Findings:  None. IMPRESSION: No evidence of deep venous thrombosis. Electronically Signed   By: Jacqulynn Cadet M.D.   On: 12/16/2018 09:42   US Paracentesis  Result Date: 01/08/2019 INDICATION: Patient with history of cirrhosis and recurrent ascites. Request for diagnostic and therapeutic paracentesis today in IR. EXAM: ULTRASOUND GUIDED DIAGNOSTIC AND THERAPEUTIC PARACENTESIS MEDICATIONS: 10 mL 1% lidocaine. COMPLICATIONS: None immediate. PROCEDURE: Informed written consent was obtained from the patient after a discussion of the risks, benefits and alternatives to treatment. A timeout was performed prior to the initiation of the procedure. Initial ultrasound scanning demonstrates a large amount of ascites within the right lower abdominal quadrant. The right lower abdomen was prepped and draped in the usual sterile fashion. 1% lidocaine was used for local anesthesia. Following this, a 6 Fr Safe-T-Centesis catheter was introduced. An ultrasound image was saved for documentation purposes. The paracentesis was performed. The catheter was removed and a dressing was applied. The patient tolerated the procedure well without immediate post procedural complication. FINDINGS: A total of approximately 4.1 L of clear yellow fluid was removed. Samples were sent to the laboratory as requested by the clinical team. IMPRESSION: Successful ultrasound-guided paracentesis yielding 4.1 liters of peritoneal fluid. Read by Candiss Norse, PA-C Electronically Signed   By: Lucrezia Europe M.D.   On: 01/08/2019 14:21   US Paracentesis  Result Date: 12/19/2018 INDICATION: Recurrent symptomatic ascites. Please perform ultrasound-guided paracentesis for therapeutic purposes. EXAM: ULTRASOUND-GUIDED PARACENTESIS COMPARISON:  Ultrasound-guided paracentesis 12/17/2018 (yielding 4.8 L of peritoneal fluid). CT abdomen pelvis-12/15/2018;  11/26/2018 MEDICATIONS: None. COMPLICATIONS: None immediate. TECHNIQUE: Informed written consent was obtained from the patient after a discussion of the risks, benefits and alternatives to treatment. A timeout was performed prior to the initiation of the procedure. Initial ultrasound scanning demonstrates a moderate amount of ascites within the right lower abdominal quadrant. The right lower abdomen was prepped and draped in the usual sterile fashion. 1% lidocaine with epinephrine was used for local anesthesia. An ultrasound image was saved for documentation purposed. An 8 Fr Safe-T-Centesis catheter was introduced. The paracentesis was performed. The catheter was removed and a dressing was applied. The patient tolerated the procedure well without immediate post procedural complication. FINDINGS: A total of approximately 3 liters of serous fluid was removed. IMPRESSION: Successful ultrasound-guided paracentesis yielding 3 liters of peritoneal fluid. Electronically Signed   By: Sandi Mariscal M.D.   On: 12/19/2018 12:08   US Paracentesis  Result Date: 12/17/2018 INDICATION: Patient with history of cirrhosis, now with ascites. Request is made for diagnostic and therapeutic paracentesis. EXAM: ULTRASOUND GUIDED DIAGNOSTIC AND THERAPEUTIC PARACENTESIS MEDICATIONS: 10 mL 1% lidocaine COMPLICATIONS: None immediate. PROCEDURE: Informed written consent was obtained from the patient after a discussion of the risks, benefits and alternatives to treatment. A timeout was performed prior to the initiation of the procedure. Initial ultrasound scanning demonstrates a moderate amount of ascites within the right lower abdominal quadrant. The right lower abdomen was prepped and draped in the usual sterile fashion. 1% lidocaine was used for local anesthesia. Following this, a Safe-T-Centesis catheter was introduced. An ultrasound image was saved for documentation purposes. The paracentesis was performed. The catheter was removed and a  dressing was applied. The patient tolerated the procedure well without immediate post procedural complication. FINDINGS: A total of approximately 4.8 liters of yellow fluid was removed. Samples were sent  to the laboratory as requested by the clinical team. IMPRESSION: Successful ultrasound-guided diagnostic and therapeutic paracentesis yielding 4.8 liters of peritoneal fluid. Read by: Kacie Matthews PA-C Electronically Signed   By: Jaime  Wagner D.O.   On: 12/17/2018 14:53   Ir Radiologist Eval & Mgmt  Result Date: 01/11/2019 Please refer to notes tab for details about interventional procedure. (Op Note)   Labs:  CBC: Recent Labs    12/19/18 0528 12/20/18 0332 12/26/18 1010 12/27/18 1259  WBC 3.1* 3.1* 4.5 4.1  HGB 8.6* 8.5* 10.2* 9.4*  HCT 27.4* 26.4* 31.3* 29.9*  PLT 74* 71* 88* 76*    COAGS: Recent Labs    11/26/18 0352 11/26/18 2110 11/27/18 0428 01/10/19 1154  INR 1.27 1.40 1.33 1.2    BMP: Recent Labs    12/20/18 0332 12/26/18 1012 12/27/18 1259 01/10/19 1154  NA 140 143 142 139  K 3.8 5.2 4.9 5.0  CL 109 105 108 102  CO2 26 20 24 26  GLUCOSE 141* 133* 160* 148*  BUN 18 24 26* 23  CALCIUM 8.2* 9.1 8.5* 9.4  CREATININE 1.32* 2.51* 2.57* 2.15*  GFRNONAA 52* 24* 23* 29*  GFRAA >60 28* 27* 33*    LIVER FUNCTION TESTS: Recent Labs    12/15/18 1202 12/17/18 0317 12/27/18 1259 01/10/19 1154  BILITOT 1.4* 1.4* 0.9 1.1  AST 30 24 32 163*  ALT 21 17 20 79*  ALKPHOS 102 99 130* 170*  PROT 6.2* 5.6* 5.9* 7.4  ALBUMIN 3.0* 2.7* 2.8* 3.6    Assessment and Plan:  I met with Mr. Camire and his wife.  Based on labs yesterday with creatinine of 2.15, INR 1.2, total bilirubin 1.1 and sodium 139, estimated MELD-Na score is 17.  Some of the recent blood work especially while hospitalized was worse and there likely was some component of decompensation of his cirrhosis due to the significant sepsis, respiratory failure requiring intubation and rehospitalization  requiring a second urologic surgical procedure.  He is now on more aggressive diuretic management but has some persistent significant renal insufficiency on diuretics.  I told Mr. Mclure that I am concerned about the renal insufficiency currently because he would have to receive contrast for a TIPS procedure (typically in the 50-100 mL range).  He is scheduled to follow-up with Dr. Kolluru soon and I would like Dr. Kolluru's opinion regarding management of renal insufficiency prior to potential contrast administration for an elective TIPS procedure.  I am also concerned about the history of myasthenia gravis.  The stress of a prolonged procedure under general anesthesia (up to 4 hours) could certainly exacerbate his myasthenia.  He did seem to tolerate shorter urologic procedures recently under general anesthesia.  A TIPS procedure would definitely be longer and be a greater stressor on his body.  I would like Dr. Shah's opinion regarding whether he would need any additional Mestinon or other medication around the timing of a TIPS procedure.  Given some fine-tuning that will be necessary and other appointments, I told Mason Wilson and his wife that I would not anticipate that a TIPS would be able to be performed for potentially another 3 to 4 weeks.  He would like to have another paracentesis procedure as he is becoming more symptomatic and I will order one as an outpatient at [facility] for tomorrow.  Thank you for this interesting consult.  I greatly enjoyed meeting Mason D Lipa Jr. and look forward to participating in their care.  A copy of this report   was sent to the requesting provider on this date.  Electronically Signed: Azzie Roup 01/11/2019, 4:42 PM     I spent a total of 40 Minutes in face to face in clinical consultation, greater than 50% of which was counseling/coordinating care for cirrhosis and ascites.

## 2019-01-12 ENCOUNTER — Other Ambulatory Visit: Payer: Self-pay

## 2019-01-12 ENCOUNTER — Ambulatory Visit
Admission: RE | Admit: 2019-01-12 | Discharge: 2019-01-12 | Disposition: A | Discharge status: Home / Self Care | Payer: Medicare Other | Source: Ambulatory Visit | Attending: Interventional Radiology | Admitting: Interventional Radiology

## 2019-01-12 ENCOUNTER — Inpatient Hospital Stay: Payer: Medicare Other

## 2019-01-12 ENCOUNTER — Ambulatory Visit (INDEPENDENT_AMBULATORY_CARE_PROVIDER_SITE_OTHER): Payer: Medicare Other | Admitting: Family Medicine

## 2019-01-12 ENCOUNTER — Inpatient Hospital Stay
Admission: EM | Admit: 2019-01-12 | Discharge: 2019-02-07 | DRG: 057 | Disposition: E | Payer: Medicare Other | Attending: Internal Medicine | Admitting: Internal Medicine

## 2019-01-12 ENCOUNTER — Emergency Department: Payer: Medicare Other

## 2019-01-12 ENCOUNTER — Encounter: Payer: Self-pay | Admitting: Family Medicine

## 2019-01-12 ENCOUNTER — Encounter: Payer: Self-pay | Admitting: Emergency Medicine

## 2019-01-12 VITALS — BP 138/77 | HR 90 | Temp 98.1°F | Wt 213.7 lb

## 2019-01-12 DIAGNOSIS — G4733 Obstructive sleep apnea (adult) (pediatric): Secondary | ICD-10-CM | POA: Diagnosis present

## 2019-01-12 DIAGNOSIS — Z515 Encounter for palliative care: Secondary | ICD-10-CM | POA: Diagnosis present

## 2019-01-12 DIAGNOSIS — Z9079 Acquired absence of other genital organ(s): Secondary | ICD-10-CM

## 2019-01-12 DIAGNOSIS — K7460 Unspecified cirrhosis of liver: Secondary | ICD-10-CM | POA: Diagnosis present

## 2019-01-12 DIAGNOSIS — R0602 Shortness of breath: Secondary | ICD-10-CM | POA: Diagnosis not present

## 2019-01-12 DIAGNOSIS — R0600 Dyspnea, unspecified: Secondary | ICD-10-CM | POA: Diagnosis not present

## 2019-01-12 DIAGNOSIS — E1122 Type 2 diabetes mellitus with diabetic chronic kidney disease: Secondary | ICD-10-CM | POA: Diagnosis present

## 2019-01-12 DIAGNOSIS — Z820 Family history of epilepsy and other diseases of the nervous system: Secondary | ICD-10-CM | POA: Diagnosis not present

## 2019-01-12 DIAGNOSIS — N179 Acute kidney failure, unspecified: Secondary | ICD-10-CM | POA: Diagnosis present

## 2019-01-12 DIAGNOSIS — I129 Hypertensive chronic kidney disease with stage 1 through stage 4 chronic kidney disease, or unspecified chronic kidney disease: Secondary | ICD-10-CM | POA: Diagnosis present

## 2019-01-12 DIAGNOSIS — I248 Other forms of acute ischemic heart disease: Secondary | ICD-10-CM | POA: Diagnosis present

## 2019-01-12 DIAGNOSIS — G7001 Myasthenia gravis with (acute) exacerbation: Secondary | ICD-10-CM | POA: Diagnosis present

## 2019-01-12 DIAGNOSIS — Z85820 Personal history of malignant melanoma of skin: Secondary | ICD-10-CM

## 2019-01-12 DIAGNOSIS — Z7189 Other specified counseling: Secondary | ICD-10-CM | POA: Diagnosis not present

## 2019-01-12 DIAGNOSIS — Z7984 Long term (current) use of oral hypoglycemic drugs: Secondary | ICD-10-CM | POA: Diagnosis not present

## 2019-01-12 DIAGNOSIS — R188 Other ascites: Secondary | ICD-10-CM | POA: Diagnosis present

## 2019-01-12 DIAGNOSIS — Z8249 Family history of ischemic heart disease and other diseases of the circulatory system: Secondary | ICD-10-CM

## 2019-01-12 DIAGNOSIS — K219 Gastro-esophageal reflux disease without esophagitis: Secondary | ICD-10-CM | POA: Diagnosis present

## 2019-01-12 DIAGNOSIS — N184 Chronic kidney disease, stage 4 (severe): Secondary | ICD-10-CM | POA: Diagnosis present

## 2019-01-12 DIAGNOSIS — K766 Portal hypertension: Secondary | ICD-10-CM | POA: Diagnosis present

## 2019-01-12 DIAGNOSIS — Z7982 Long term (current) use of aspirin: Secondary | ICD-10-CM

## 2019-01-12 DIAGNOSIS — Z794 Long term (current) use of insulin: Secondary | ICD-10-CM | POA: Diagnosis not present

## 2019-01-12 DIAGNOSIS — Z66 Do not resuscitate: Secondary | ICD-10-CM | POA: Diagnosis present

## 2019-01-12 DIAGNOSIS — Z8349 Family history of other endocrine, nutritional and metabolic diseases: Secondary | ICD-10-CM

## 2019-01-12 DIAGNOSIS — E785 Hyperlipidemia, unspecified: Secondary | ICD-10-CM | POA: Diagnosis present

## 2019-01-12 DIAGNOSIS — N400 Enlarged prostate without lower urinary tract symptoms: Secondary | ICD-10-CM | POA: Diagnosis present

## 2019-01-12 DIAGNOSIS — Z720 Tobacco use: Secondary | ICD-10-CM

## 2019-01-12 DIAGNOSIS — I2510 Atherosclerotic heart disease of native coronary artery without angina pectoris: Secondary | ICD-10-CM | POA: Diagnosis present

## 2019-01-12 DIAGNOSIS — Z79899 Other long term (current) drug therapy: Secondary | ICD-10-CM

## 2019-01-12 DIAGNOSIS — R140 Abdominal distension (gaseous): Secondary | ICD-10-CM

## 2019-01-12 DIAGNOSIS — F321 Major depressive disorder, single episode, moderate: Secondary | ICD-10-CM

## 2019-01-12 DIAGNOSIS — Z809 Family history of malignant neoplasm, unspecified: Secondary | ICD-10-CM

## 2019-01-12 DIAGNOSIS — J90 Pleural effusion, not elsewhere classified: Secondary | ICD-10-CM | POA: Diagnosis not present

## 2019-01-12 LAB — COMPREHENSIVE METABOLIC PANEL
ALT: 78 U/L — ABNORMAL HIGH (ref 0–44)
AST: 127 U/L — ABNORMAL HIGH (ref 15–41)
Albumin: 3.3 g/dL — ABNORMAL LOW (ref 3.5–5.0)
Alkaline Phosphatase: 138 U/L — ABNORMAL HIGH (ref 38–126)
Anion gap: 8 (ref 5–15)
BUN: 32 mg/dL — ABNORMAL HIGH (ref 8–23)
CO2: 27 mmol/L (ref 22–32)
Calcium: 9.3 mg/dL (ref 8.9–10.3)
Chloride: 103 mmol/L (ref 98–111)
Creatinine, Ser: 2.22 mg/dL — ABNORMAL HIGH (ref 0.61–1.24)
GFR calc Af Amer: 32 mL/min — ABNORMAL LOW (ref >60)
GFR, EST NON AFRICAN AMERICAN: 28 mL/min — AB (ref >60)
Glucose, Bld: 167 mg/dL — ABNORMAL HIGH (ref 70–99)
Potassium: 4.8 mmol/L (ref 3.5–5.1)
Sodium: 138 mmol/L (ref 135–145)
Total Bilirubin: 0.9 mg/dL (ref 0.3–1.2)
Total Protein: 6.8 g/dL (ref 6.5–8.1)

## 2019-01-12 LAB — BLOOD GAS, VENOUS
ACID-BASE EXCESS: 2.8 mmol/L — AB (ref 0.0–2.0)
Bicarbonate: 29.4 mmol/L — ABNORMAL HIGH (ref 20.0–28.0)
O2 Saturation: 26.9 %
Patient temperature: 37
pCO2, Ven: 52 mmHg (ref 44.0–60.0)
pH, Ven: 7.36 (ref 7.250–7.430)

## 2019-01-12 LAB — CBC WITH DIFFERENTIAL/PLATELET
Abs Immature Granulocytes: 0.01 10*3/uL (ref 0.00–0.07)
BASOS ABS: 0 10*3/uL (ref 0.0–0.1)
Basophils Relative: 0 %
Eosinophils Absolute: 0.2 10*3/uL (ref 0.0–0.5)
Eosinophils Relative: 4 %
HCT: 34.7 % — ABNORMAL LOW (ref 39.0–52.0)
Hemoglobin: 11.2 g/dL — ABNORMAL LOW (ref 13.0–17.0)
Immature Granulocytes: 0 %
LYMPHS ABS: 0.6 10*3/uL — AB (ref 0.7–4.0)
LYMPHS PCT: 12 %
MCH: 30.5 pg (ref 26.0–34.0)
MCHC: 32.3 g/dL (ref 30.0–36.0)
MCV: 94.6 fL (ref 80.0–100.0)
Monocytes Absolute: 0.6 10*3/uL (ref 0.1–1.0)
Monocytes Relative: 12 %
NRBC: 0 % (ref 0.0–0.2)
Neutro Abs: 3.5 10*3/uL (ref 1.7–7.7)
Neutrophils Relative %: 72 %
Platelets: 124 10*3/uL — ABNORMAL LOW (ref 150–400)
RBC: 3.67 MIL/uL — ABNORMAL LOW (ref 4.22–5.81)
RDW: 14.6 % (ref 11.5–15.5)
WBC: 4.9 10*3/uL (ref 4.0–10.5)

## 2019-01-12 LAB — BRAIN NATRIURETIC PEPTIDE: B Natriuretic Peptide: 48 pg/mL (ref 0.0–100.0)

## 2019-01-12 LAB — FIBRIN DERIVATIVES D-DIMER (ARMC ONLY): Fibrin derivatives D-dimer (ARMC): 5034.7 ng/mL (FEU) — ABNORMAL HIGH (ref 0.00–499.00)

## 2019-01-12 LAB — BODY FLUID CULTURE: Culture: NO GROWTH

## 2019-01-12 LAB — GLUCOSE, CAPILLARY: Glucose-Capillary: 314 mg/dL — ABNORMAL HIGH (ref 70–99)

## 2019-01-12 LAB — TROPONIN I: Troponin I: 0.28 ng/mL (ref <0.03)

## 2019-01-12 MED ORDER — ACETAMINOPHEN 650 MG RE SUPP: 650.0000 mg | Freq: Four times a day (QID) | RECTAL | Status: DC | PRN

## 2019-01-12 MED ORDER — ONDANSETRON HCL 4 MG/2ML IJ SOLN: 4.0000 mg | Freq: Four times a day (QID) | INTRAMUSCULAR | Status: DC | PRN

## 2019-01-12 MED ORDER — INSULIN ASPART 100 UNIT/ML ~~LOC~~ SOLN
0.0000 [IU] | SUBCUTANEOUS | Status: DC
  Administered 2019-01-12: 15 [IU] via SUBCUTANEOUS
  Administered 2019-01-13: 7 [IU] via SUBCUTANEOUS
  Administered 2019-01-13: 4 [IU] via SUBCUTANEOUS
  Filled 2019-01-12 (×3): qty 1

## 2019-01-12 MED ORDER — ENOXAPARIN SODIUM 40 MG/0.4ML ~~LOC~~ SOLN
40.0000 mg | SUBCUTANEOUS | Status: DC
  Administered 2019-01-12: 40 mg via SUBCUTANEOUS
  Filled 2019-01-12: qty 0.4

## 2019-01-12 MED ORDER — ONDANSETRON HCL 4 MG PO TABS: 4.0000 mg | ORAL_TABLET | Freq: Four times a day (QID) | ORAL | Status: DC | PRN

## 2019-01-12 MED ORDER — ACETAMINOPHEN 325 MG PO TABS: 650.0000 mg | ORAL_TABLET | Freq: Four times a day (QID) | ORAL | Status: DC | PRN

## 2019-01-12 MED ORDER — SIMETHICONE 40 MG/0.6ML PO SUSP
40.0000 mg | Freq: Once | ORAL | Status: AC
  Administered 2019-01-12: 40 mg via ORAL
  Filled 2019-01-12 (×4): qty 0.6

## 2019-01-12 NOTE — Progress Notes (Signed)
NIF completed on pt. Pt average was 16 cm H2O after 3 attempts

## 2019-01-12 NOTE — ED Notes (Signed)
ED Provider at bedside. 

## 2019-01-12 NOTE — ED Notes (Signed)
Attempted to call NM without answer.  Will try again.

## 2019-01-12 NOTE — ED Triage Notes (Signed)
Pt to ED via POV from [facility] practice. Pts wife states that pt is having shortness of breath. Pt had a paracentesis this morning and had about 3 liters of fluid drawn off. Pt has labored respirations and appears to be in mild distress at this time.

## 2019-01-12 NOTE — Procedures (Signed)
Interventional Radiology Procedure:   Indications: Cirrhosis and ascites  Procedure: US guided paracentesis  Findings: Removed 3.65 liters of yellow opaque fluid.  Complications: None     EBL: Minimal  Plan: Discharge to home.     Taraoluwa Thakur R. Anselm Pancoast, MD  Pager: [phone]

## 2019-01-12 NOTE — Progress Notes (Signed)
BP 138/77   Pulse 90   Temp 98.1 F (36.7 C) (Oral)   Wt 213 lb 11.2 oz (96.9 kg)   SpO2 98%   BMI 31.56 kg/m    Subjective:    Patient ID: Mason Chute., male    DOB: [birth year], 77 y.o.   MRN: [id number]  HPI: Mason Wilson is a 78 y.o. male  Chief Complaint  Patient presents with  . Shortness of Breath    pt states he has been short of breath since 11/27/18. States he had fluid drained this morning off of his abdomin    Here today for progressively worsening SOB. Cannot complete a full sentence, has limited exertional ability, and cannot even slightly recline from upright position without completely losing ability to breathe. States this started to a lesser degree several weeks ago but has become significantly worse the last few days. Denies CP, fevers, recent illnesses, productive cough. 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 Recent medication changes include -  Stopped metformin and losartan, back on 80 mg lasix and 100 mg spironolactone per kidney specialist. Had 3L of peritoneal fluid drawn off via paracentesis this morning which helped very mildly for a few minutes but no significant improvement. Had a chest x-ray ordered by GI specialist 2 days ago showing mild b/l pleural effusion.   Relevant past medical, surgical, family and social history reviewed and updated as indicated. Interim medical history since our last visit reviewed. Allergies and medications reviewed and updated.  Review of Systems  Per HPI unless specifically indicated above     Objective:    BP 138/77   Pulse 90   Temp 98.1 F (36.7 C) (Oral)   Wt 213 lb 11.2 oz (96.9 kg)   SpO2 98%   BMI 31.56 kg/m   Wt Readings from Last 3 Encounters:  01/11/2019 210 lb 1.6 oz (95.3 kg)  01/29/2019 213 lb 11.2 oz (96.9 kg)  01/11/19 222 lb (100.7 kg)    Physical Exam Vitals signs and nursing note reviewed.    Constitutional:      Appearance: He is ill-appearing.  HENT:     Head: Atraumatic.     Nose: Nose normal.     Mouth/Throat:     Mouth: Mucous membranes are moist.     Pharynx: Oropharynx is clear.  Eyes:     Extraocular Movements: Extraocular movements intact.  Neck:     Musculoskeletal: Normal range of motion and neck supple.  Cardiovascular:     Rate and Rhythm: Normal rate and regular rhythm.  Pulmonary:     Breath sounds: No stridor. No wheezing or rales.     Comments: Increased effort, particularly when speaking Musculoskeletal: Normal range of motion.  Skin:    General: Skin is warm.     Findings: No rash.  Neurological:     Mental Status: He is alert and oriented to person, place, and time. Mental status is at baseline.     Coordination: Coordination normal.  Psychiatric:        Behavior: Behavior normal.        Thought Content: Thought content normal.        Judgment: Judgment normal.     Results for orders placed or performed during the hospital encounter of 01/10/19  Protime-INR  Result Value Ref Range   Prothrombin Time 15.5 (H) 11.4 - 15.2 seconds   INR 1.2  0.8 - 1.2  Comprehensive metabolic panel  Result Value Ref Range   Sodium 139 135 - 145 mmol/L   Potassium 5.0 3.5 - 5.1 mmol/L   Chloride 102 98 - 111 mmol/L   CO2 26 22 - 32 mmol/L   Glucose, Bld 148 (H) 70 - 99 mg/dL   BUN 23 8 - 23 mg/dL   Creatinine, Ser 2.15 (H) 0.61 - 1.24 mg/dL   Calcium 9.4 8.9 - 10.3 mg/dL   Total Protein 7.4 6.5 - 8.1 g/dL   Albumin 3.6 3.5 - 5.0 g/dL   AST 163 (H) 15 - 41 U/L   ALT 79 (H) 0 - 44 U/L   Alkaline Phosphatase 170 (H) 38 - 126 U/L   Total Bilirubin 1.1 0.3 - 1.2 mg/dL   GFR calc non Af Amer 29 (L) >60 mL/min   GFR calc Af Amer 33 (L) >60 mL/min   Anion gap 11 5 - 15      Assessment & Plan:   Problem List Items Addressed This Visit    None    Visit Diagnoses    SOB (shortness of breath)    -  Primary   Depression, major, single episode, moderate  (HCC)       Patient tearful and very frustrated with his medical issues at this point, agreeable to discussing temporary tx once breathing stabilized     Known b/l pleural effusion, potentially worsening to become this symptomatic but could also be related to his myasthenia if flaring. Already on high dose diuretics and cannot get imaging of his chest back by end of day or manage a myasthenia crisis in this setting so strongly recommended they present to the ER. Pt and wife agreeable to this plan and she will drive him directly there. [place] called and given report that pt is coming.   Greater than 25 min spent in direct care and counseling.   Follow up plan: Return for ER f/u.

## 2019-01-12 NOTE — H&P (Signed)
[facility] NAME: Mason Wilson    MR#:  [id number]  DATE OF BIRTH:  08/09/1941  DATE OF ADMISSION:  02/01/2019  PRIMARY CARE PHYSICIAN: Pccm, Ander Gaster, MD   REQUESTING/REFERRING PHYSICIAN: Jimmye Norman, MD  CHIEF COMPLAINT:   Chief Complaint  Patient presents with  . Shortness of Breath    HISTORY OF PRESENT ILLNESS:  Mason Wilson  is a 78 y.o. male who presents with chief complaint as above.  Patient presents to the ED with a complaint of worsening shortness of breath.  He has a history of myasthenia gravis, and states that for the past 2 months his breathing is been getting worse.  He also has a history of cirrhosis and has had frequent paracentesis done.  Couple of months ago he was hospitalized and required intubation and treatment for sepsis due to prostate abscesses.  He then had drainage of his abscesses.  Since that time his breathing has been getting worse.  It is significantly worse over the last few days, which led him come to the ED.  On evaluation in the ED today NIF was performed and was consistently less than 20, around 16.  Strong concern for myasthenia exacerbation.  Hospitalist called for admission  PAST MEDICAL HISTORY:   Past Medical History:  Diagnosis Date  . Allergy   . Arthritis    "everywhere' - worse in back  . Body mass index 39.0-39.9, adult   . CAD (coronary artery disease)   . Chronic back pain   . Cirrhosis, non-alcoholic ([place])   . Diabetes mellitus without complication ([place])   . Elevated liver enzymes   . GERD (gastroesophageal reflux disease)   . Heart murmur   . Hyperlipidemia   . Hypertension   . Hypogonadism in male   . Iron deficiency anemia due to chronic blood loss   . Melanoma ([place])    melanoma   . Melanoma in situ of right shoulder ([place])   . Myasthenia gravis ([place]) 01/2018  . Renal stones   . Sleep apnea    CPAP  . Uses hearing aid    doesn't wear     PAST SURGICAL  HISTORY:   Past Surgical History:  Procedure Laterality Date  . ANGIOPLASTY  1999   3 stents  . CARDIAC CATHETERIZATION     stents placed  . CARPAL TUNNEL RELEASE     x2  . CATARACT EXTRACTION, BILATERAL    . COLONOSCOPY WITH PROPOFOL N/A 07/03/2018   Procedure: COLONOSCOPY WITH PROPOFOL;  Surgeon: Lucilla Lame, MD;  Location: [facility];  Service: Endoscopy;  Laterality: N/A;  . ESOPHAGOGASTRODUODENOSCOPY (EGD) WITH PROPOFOL N/A 09/08/2015   Procedure: ESOPHAGOGASTRODUODENOSCOPY (EGD) WITH PROPOFOL;  Surgeon: Lucilla Lame, MD;  Location: [place];  Service: Endoscopy;  Laterality: N/A;  CPAP Diabetic - oral meds  . ESOPHAGOGASTRODUODENOSCOPY (EGD) WITH PROPOFOL N/A 07/03/2018   Procedure: ESOPHAGOGASTRODUODENOSCOPY (EGD) WITH PROPOFOL;  Surgeon: Lucilla Lame, MD;  Location: [facility];  Service: Endoscopy;  Laterality: N/A;  diabetic - oral meds sleep apnea  . EYE SURGERY  cataracts  . IR RADIOLOGIST EVAL & MGMT  01/11/2019  . LIVER BIOPSY    . POLYPECTOMY N/A 07/03/2018   Procedure: POLYPECTOMY;  Surgeon: Lucilla Lame, MD;  Location: [place];  Service: Endoscopy;  Laterality: N/A;  . SKIN LESION EXCISION  1998  . SKIN LESION EXCISION     skin cancer removed on forehead in June 2018  .  TONSILLECTOMY     age 21  . TRANSURETHRAL RESECTION OF PROSTATE N/A 11/26/2018   Procedure: TRANSURETHRAL RESECTION OF THE PROSTATE (TURP);  Surgeon: Irine Seal, MD;  Location: [facility];  Service: Urology;  Laterality: N/A;  . TRANSURETHRAL RESECTION OF PROSTATE N/A 12/17/2018   Procedure: TRANSURETHRAL RESECTION OF THE PROSTATE (TURP),cystoscopy,clot evacuation;  Surgeon: Lucas Mallow, MD;  Location: [facility];  Service: Urology;  Laterality: N/A;     SOCIAL HISTORY:   Social History   Tobacco Use  . Smoking status: Former Smoker    Packs/day: 3.00    Years: 0.00    Pack years: 0.00    Types: Cigarettes    Last attempt to quit: 04/02/1970    Years  since quitting: 48.8  . Smokeless tobacco: Former Systems developer    Types: Chew  . Tobacco comment: approx quit in 1971   Substance Use Topics  . Alcohol use: No    Alcohol/week: 0.0 standard drinks    Comment: stopped in 1996     FAMILY HISTORY:   Family History  Problem Relation Age of Onset  . Thyroid disease Mother   . Alzheimer's disease Mother   . Heart disease Mother   . Cancer Father 7       testicular  . Heart disease Father   . Hypertension Father   . Heart attack Father   . Kidney disease Neg Hx   . Prostate cancer Neg Hx   . Kidney cancer Neg Hx   . Bladder Cancer Neg Hx      DRUG ALLERGIES:   Allergies  Allergen Reactions  . Altace [Ramipril] Cough  . Ciprofloxacin Other (See Comments)    Contraindicated in patients with myasthenia gravis     MEDICATIONS AT HOME:   Prior to Admission medications   Medication Sig Start Date End Date Taking? Authorizing Provider  Calcium Carbonate-Vit D-Min (CALCIUM 1200 PO) Take 1,200 mg by mouth.   Yes [provider]  cefUROXime (CEFTIN) 250 MG tablet Take 1 tablet (250 mg total) by mouth 2 (two) times daily. 11/30/18 01/14/19 Yes Vaughan Basta, MD  furosemide (LASIX) 20 MG tablet Take 1 tablet (20 mg total) by mouth daily. 12/26/18  Yes Volney American, PA-C  furosemide (LASIX) 80 MG tablet Take 1 tablet (80 mg total) by mouth daily. 12/21/18  Yes Henreitta Leber, MD  glipiZIDE (GLUCOTROL) 5 MG tablet Take 1 tablet (5 mg total) by mouth daily before breakfast. 09/05/18  Yes Crissman, Jeannette How, MD  metoprolol succinate (TOPROL-XL) 50 MG 24 hr tablet Take 1 tablet (50 mg total) by mouth daily. Take with or immediately following a meal. 09/05/18  Yes Crissman, Jeannette How, MD  Multiple Vitamin (MULTIVITAMIN WITH MINERALS) TABS tablet Take 1 tablet by mouth daily. 12/01/18  Yes Vaughan Basta, MD  nystatin (MYCOSTATIN/NYSTOP) powder Apply topically 2 (two) times daily. Apply to inguinal area twice daily.  12/20/18  Yes Henreitta Leber, MD  omeprazole (PRILOSEC) 40 MG capsule Take 40 mg by mouth daily.  11/23/18  Yes [provider]  pyridostigmine (MESTINON) 60 MG tablet Take 60 mg by mouth 4 (four) times daily.   Yes [provider]  simvastatin (ZOCOR) 40 MG tablet Take 1 tablet (40 mg total) by mouth daily at 6 PM. 09/05/18  Yes Crissman, Jeannette How, MD  spironolactone (ALDACTONE) 100 MG tablet Take 1 tablet (100 mg total) by mouth daily. 12/21/18 02/19/19 Yes Henreitta Leber, MD  tamsulosin (FLOMAX) 0.4 MG CAPS  capsule TAKE 1 CAPSULE(0.4 MG) BY MOUTH DAILY 08/09/18  Yes McGowan, Larene Beach A, PA-C  acetaminophen (TYLENOL) 325 MG tablet Place 2 tablets (650 mg total) into feeding tube every 6 (six) hours as needed for mild pain (or Fever >/= 101). 11/30/18   Vaughan Basta, MD  nystatin-triamcinolone ointment (MYCOLOG) Apply 1 application topically 2 (two) times daily. 01/08/19   Billey Co, MD    REVIEW OF SYSTEMS:  Review of Systems  Constitutional: Negative for chills, fever, malaise/fatigue and weight loss.  HENT: Negative for ear pain, hearing loss and tinnitus.   Eyes: Negative for blurred vision, double vision, pain and redness.  Respiratory: Positive for shortness of breath (Patient is unable to speak in full sentences due to the requirement to take deep breaths). Negative for cough and hemoptysis.   Cardiovascular: Negative for chest pain, palpitations, orthopnea and leg swelling.  Gastrointestinal: Negative for abdominal pain, constipation, diarrhea, nausea and vomiting.  Genitourinary: Negative for dysuria, frequency and hematuria.  Musculoskeletal: Negative for back pain, joint pain and neck pain.  Skin:       No acne, rash, or lesions  Neurological: Negative for dizziness, tremors, focal weakness and weakness.  Endo/Heme/Allergies: Negative for polydipsia. Does not bruise/bleed easily.  Psychiatric/Behavioral: Negative for depression. The patient is not  nervous/anxious and does not have insomnia.      VITAL SIGNS:   Vitals:   01/16/2019 1930 01/14/2019 2000 01/19/2019 2030 01/25/2019 2038  BP:    (!) 153/75  Pulse: 86 93 92 100  Resp: (!) 32 (!) 24 (!) 25 19  Temp:      TempSrc:      SpO2: 98% 100% 100% 99%  Weight:       Wt Readings from Last 3 Encounters:  01/21/2019 96.9 kg  02/04/2019 96.9 kg  01/11/19 100.7 kg    PHYSICAL EXAMINATION:  Physical Exam  Vitals reviewed. Constitutional: He is oriented to person, place, and time. He appears well-developed and well-nourished. No distress.  HENT:  Head: Normocephalic and atraumatic.  Mouth/Throat: Oropharynx is clear and moist.  Eyes: Pupils are equal, round, and reactive to light. Conjunctivae and EOM are normal. No scleral icterus.  Neck: Normal range of motion. Neck supple. No JVD present. No thyromegaly present.  Cardiovascular: Normal rate, regular rhythm and intact distal pulses. Exam reveals no gallop and no friction rub.  No murmur heard. Respiratory: Breath sounds normal. No respiratory distress. He has no wheezes. He has no rales.  Mildly increased respiratory effort  GI: Soft. Bowel sounds are normal. He exhibits no distension. There is no abdominal tenderness.  Musculoskeletal: Normal range of motion.        General: No edema.     Comments: No arthritis, no gout  Lymphadenopathy:    He has no cervical adenopathy.  Neurological: He is alert and oriented to person, place, and time. No cranial nerve deficit.  No dysarthria, no aphasia  Skin: Skin is warm and dry. No rash noted. No erythema.  Psychiatric: He has a normal mood and affect. His behavior is normal. Judgment and thought content normal.    LABORATORY PANEL:   CBC Recent Labs  Lab 01/14/2019 1607  WBC 4.9  HGB 11.2*  HCT 34.7*  PLT 124*   ------------------------------------------------------------------------------------------------------------------  Chemistries  Recent Labs  Lab 01/14/2019 1607  NA  138  K 4.8  CL 103  CO2 27  GLUCOSE 167*  BUN 32*  CREATININE 2.22*  CALCIUM 9.3  AST 127*  ALT 78*  ALKPHOS 138*  BILITOT 0.9   ------------------------------------------------------------------------------------------------------------------  Cardiac Enzymes Recent Labs  Lab 01/24/2019 1607  TROPONINI 0.28*   ------------------------------------------------------------------------------------------------------------------  RADIOLOGY:  Dg Chest 2 View  Result Date: 01/27/2019 CLINICAL DATA:  78 y/o  M; EXAM: CHEST - 2 VIEW COMPARISON:  01/10/2019 chest radiograph FINDINGS: Normal cardiac silhouette. Aortic atherosclerosis with calcification. Clear lungs. Stable small bilateral pleural effusions. No pneumothorax. No acute osseous abnormality is evident. IMPRESSION: Stable small bilateral pleural effusions. Electronically Signed   By: Kristine Garbe M.D.   On: 01/22/2019 17:24   US Paracentesis  Result Date: 02/06/2019 INDICATION: 78 year old with cirrhosis and recurrent ascites. Patient complains of shortness of breath. EXAM: ULTRASOUND GUIDED THERAPEUTIC PARACENTESIS MEDICATIONS: None. COMPLICATIONS: None immediate. PROCEDURE: Informed written consent was obtained from the patient after a discussion of the risks, benefits and alternatives to treatment. A timeout was performed prior to the initiation of the procedure. Initial ultrasound scanning demonstrates a large amount of ascites within the left lower abdominal quadrant. The left lower abdomen was prepped and draped in the usual sterile fashion. 1% lidocaine was used for local anesthesia. Following this, a 6 Fr Safe-T-Centesis catheter was introduced. An ultrasound image was saved for documentation purposes. The paracentesis was performed. The catheter was removed and a dressing was applied. The patient tolerated the procedure well without immediate post procedural complication. FINDINGS: A total of approximately 3.65 L of  yellow opaque fluid was removed. IMPRESSION: Successful ultrasound-guided paracentesis yielding 3.65 liters of peritoneal fluid. Electronically Signed   By: Markus Daft M.D.   On: 01/24/2019 11:37   Ir Radiologist Eval & Mgmt  Result Date: 01/11/2019 Please refer to notes tab for details about interventional procedure. (Op Note)   EKG:   Orders placed or performed during the hospital encounter of 02/03/2019  . ED EKG  . ED EKG    IMPRESSION AND PLAN:  Principal Problem:   Myasthenia gravis in crisis [facility]) -admit patient to stepdown due to borderline NIF around 16.  Every 6 hour NIF.  Neurology consult for recommendation around consideration of IVIG administration Active Problems:   CAD (coronary artery disease) -continue home meds   CKD (chronic kidney disease), stage IV (HCC) -at baseline, avoid nephrotoxins and monitor   Hyperlipidemia -home dose antilipid   BPH (benign prostatic hyperplasia) -continue home medications   Cirrhosis ([place]) -avoid hepatotoxins  Chart review performed and case discussed with ED provider. Labs, imaging and/or ECG reviewed by provider and discussed with patient/family. Management plans discussed with the patient and/or family.  DVT PROPHYLAXIS: SubQ lovenox   GI PROPHYLAXIS:  None  ADMISSION STATUS: Inpatient     CODE STATUS: Full Code Status History    Date Active Date Inactive Code Status Order ID Comments User Context   12/15/2018 2007 12/20/2018 1538 DNR [id number]  Vaughan Basta, MD Inpatient   11/26/2018 1142 11/30/2018 2011 Full Code [id number]  Saundra Shelling, MD Inpatient   06/23/2016 1427 06/25/2016 1722 Full Code [id number]  Epifanio Lesches, MD ED    Questions for Most Recent Historical Code Status (Order [id number])    Question Answer Comment   In the event of cardiac or respiratory ARREST Do not call a "code blue"    In the event of cardiac or respiratory ARREST Do not perform Intubation, CPR, defibrillation or ACLS    In the event  of cardiac or respiratory ARREST Use medication by any route, position, wound care, and other measures to relive pain and suffering. May use oxygen, suction and manual  treatment of airway obstruction as needed for comfort.         Advance Directive Documentation     Most Recent Value  Type of Advance Directive  Healthcare Power of Attorney, Living will  Pre-existing out of facility DNR order (yellow form or pink MOST form)  -  "MOST" Form in Place?  -      TOTAL TIME TAKING CARE OF THIS PATIENT: 45 minutes.   Ethlyn Daniels 01/26/2019, 9:18 PM  [facility]  Office  [phone]  CC: Primary care physician; Pccm, [facility], MD  Note:  This document was prepared using Dragon voice recognition software and may include unintentional dictation errors.

## 2019-01-12 NOTE — ED Notes (Addendum)
ED TO INPATIENT HANDOFF REPORT  ED Nurse Name and Phone #: Annie Main [phone]  S Name/Age/Gender [name] 78 y.o. male Room/Bed: ED07A/ED07A  Code Status   Code Status: Prior  Home/SNF/Other Home Patient oriented to: self, place, time and situation Is this baseline? Yes   Triage Complete: Triage complete  Chief Complaint SOB  Triage Note Pt to ED via [place] from [place] practice. Pts wife states that pt is having shortness of breath. Pt had a paracentesis this morning and had about 3 liters of fluid drawn off. Pt has labored respirations and appears to be in mild distress at this time.    Allergies Allergies  Allergen Reactions  . Altace [Ramipril] Cough  . Ciprofloxacin Other (See Comments)    Contraindicated in patients with myasthenia gravis     Level of Care/Admitting Diagnosis ED Disposition    ED Disposition Condition [facility] Area: [place] [[id number]]  Level of Care: Stepdown [14]  Diagnosis: Myasthenic crisis [facility]) [[id number]]  Admitting Physician: Lance Coon [[id number]]  Attending Physician: Jannifer Franklin, DAVID [phone]  Estimated length of stay: past midnight tomorrow  Certification:: I certify this patient will need inpatient services for at least 2 midnights  PT Class (Do Not Modify): Inpatient [101]  PT Acc Code (Do Not Modify): Private [1]       B Medical/Surgery History Past Medical History:  Diagnosis Date  . Allergy   . Arthritis    "everywhere' - worse in back  . Body mass index 39.0-39.9, adult   . CAD (coronary artery disease)   . Chronic back pain   . Cirrhosis, non-alcoholic ([place])   . Diabetes mellitus without complication ([place])   . Elevated liver enzymes   . GERD (gastroesophageal reflux disease)   . Heart murmur   . Hyperlipidemia   . Hypertension   . Hypogonadism in male   . Iron deficiency anemia due to chronic blood loss   . Melanoma ([place])    melanoma   . Melanoma in situ of  right shoulder ([place])   . Myasthenia gravis ([place]) 01/2018  . Renal stones   . Sleep apnea    CPAP  . Uses hearing aid    doesn't wear   Past Surgical History:  Procedure Laterality Date  . ANGIOPLASTY  1999   3 stents  . CARDIAC CATHETERIZATION     stents placed  . CARPAL TUNNEL RELEASE     x2  . CATARACT EXTRACTION, BILATERAL    . COLONOSCOPY WITH PROPOFOL N/A 07/03/2018   Procedure: COLONOSCOPY WITH PROPOFOL;  Surgeon: Lucilla Lame, MD;  Location: [place];  Service: Endoscopy;  Laterality: N/A;  . ESOPHAGOGASTRODUODENOSCOPY (EGD) WITH PROPOFOL N/A 09/08/2015   Procedure: ESOPHAGOGASTRODUODENOSCOPY (EGD) WITH PROPOFOL;  Surgeon: Lucilla Lame, MD;  Location: [place];  Service: Endoscopy;  Laterality: N/A;  CPAP Diabetic - oral meds  . ESOPHAGOGASTRODUODENOSCOPY (EGD) WITH PROPOFOL N/A 07/03/2018   Procedure: ESOPHAGOGASTRODUODENOSCOPY (EGD) WITH PROPOFOL;  Surgeon: Lucilla Lame, MD;  Location: [place];  Service: Endoscopy;  Laterality: N/A;  diabetic - oral meds sleep apnea  . EYE SURGERY  cataracts  . IR RADIOLOGIST EVAL & MGMT  01/11/2019  . LIVER BIOPSY    . POLYPECTOMY N/A 07/03/2018   Procedure: POLYPECTOMY;  Surgeon: Lucilla Lame, MD;  Location: [place];  Service: Endoscopy;  Laterality: N/A;  . SKIN LESION EXCISION  1998  . SKIN LESION EXCISION     skin cancer  removed on forehead in June 2018  . TONSILLECTOMY     age 40  . TRANSURETHRAL RESECTION OF PROSTATE N/A 11/26/2018   Procedure: TRANSURETHRAL RESECTION OF THE PROSTATE (TURP);  Surgeon: Irine Seal, MD;  Location: [facility];  Service: Urology;  Laterality: N/A;  . TRANSURETHRAL RESECTION OF PROSTATE N/A 12/17/2018   Procedure: TRANSURETHRAL RESECTION OF THE PROSTATE (TURP),cystoscopy,clot evacuation;  Surgeon: Lucas Mallow, MD;  Location: [facility];  Service: Urology;  Laterality: N/A;     A IV Location/Drains/Wounds Patient Lines/Drains/Airways Status   Active  Line/Drains/Airways    Name:   Placement date:   Placement time:   Site:   Days:   Peripheral IV 01/29/2019 Left Forearm   02/06/2019    1640    Forearm   less than 1   Incision (Closed) 11/26/18 Penis Other (Comment)   11/26/18    1106     47          Intake/Output Last 24 hours No intake or output data in the 24 hours ending 01/26/2019 2128  Labs/Imaging Results for orders placed or performed during the hospital encounter of 01/11/2019 (from the past 48 hour(s))  CBC with Differential     Status: Abnormal   Collection Time: 01/26/2019  4:07 PM  Result Value Ref Range   WBC 4.9 4.0 - 10.5 K/uL   RBC 3.67 (L) 4.22 - 5.81 MIL/uL   Hemoglobin 11.2 (L) 13.0 - 17.0 g/dL   HCT 34.7 (L) 39.0 - 52.0 %   MCV 94.6 80.0 - 100.0 fL   MCH 30.5 26.0 - 34.0 pg   MCHC 32.3 30.0 - 36.0 g/dL   RDW 14.6 11.5 - 15.5 %   Platelets 124 (L) 150 - 400 K/uL    Comment: Immature Platelet Fraction may be clinically indicated, consider ordering this additional test [id number]    nRBC 0.0 0.0 - 0.2 %   Neutrophils Relative % 72 %   Neutro Abs 3.5 1.7 - 7.7 K/uL   Lymphocytes Relative 12 %   Lymphs Abs 0.6 (L) 0.7 - 4.0 K/uL   Monocytes Relative 12 %   Monocytes Absolute 0.6 0.1 - 1.0 K/uL   Eosinophils Relative 4 %   Eosinophils Absolute 0.2 0.0 - 0.5 K/uL   Basophils Relative 0 %   Basophils Absolute 0.0 0.0 - 0.1 K/uL   Immature Granulocytes 0 %   Abs Immature Granulocytes 0.01 0.00 - 0.07 K/uL    Comment: Performed at [facility], [address] [place], [place] [address]  Brain natriuretic peptide     Status: None   Collection Time: 01/26/2019  4:07 PM  Result Value Ref Range   B Natriuretic Peptide 48.0 0.0 - 100.0 pg/mL    Comment: Performed at [facility] [address] [place], [place] [address]  Troponin I - ONCE - STAT     Status: Abnormal   Collection Time: 02/04/2019  4:07 PM  Result Value Ref Range   Troponin I 0.28 (HH) <0.03 ng/mL    Comment: CRITICAL RESULT CALLED  TO, READ BACK BY AND VERIFIED WITH Shandra Szymborski 01/14/2019 @ [place] Performed at [facility], [address] [place], [place] [address]   Comprehensive metabolic panel     Status: Abnormal   Collection Time: 01/19/2019  4:07 PM  Result Value Ref Range   Sodium 138 135 - 145 mmol/L   Potassium 4.8 3.5 - 5.1 mmol/L   Chloride 103 98 - 111  mmol/L   CO2 27 22 - 32 mmol/L   Glucose, Bld 167 (H) 70 - 99 mg/dL   BUN 32 (H) 8 - 23 mg/dL   Creatinine, Ser 2.22 (H) 0.61 - 1.24 mg/dL   Calcium 9.3 8.9 - 10.3 mg/dL   Total Protein 6.8 6.5 - 8.1 g/dL   Albumin 3.3 (L) 3.5 - 5.0 g/dL   AST 127 (H) 15 - 41 U/L   ALT 78 (H) 0 - 44 U/L   Alkaline Phosphatase 138 (H) 38 - 126 U/L   Total Bilirubin 0.9 0.3 - 1.2 mg/dL   GFR calc non Af Amer 28 (L) >60 mL/min   GFR calc Af Amer 32 (L) >60 mL/min   Anion gap 8 5 - 15    Comment: Performed at [facility], [address] [place], [place] [address]  Fibrin derivatives D-[facility] only)     Status: Abnormal   Collection Time: 01/25/2019  4:07 PM  Result Value Ref Range   Fibrin derivatives D-dimer ([facility]) 5,034.70 (H) 0.00 - 499.00 ng/mL (FEU)    Comment: (NOTE) <> Exclusion of Venous Thromboembolism (VTE) - OUTPATIENT ONLY   (Emergency Department or [place])   0-499 ng/ml (FEU): With a low to intermediate pretest probability                      for VTE this test result excludes the diagnosis                      of VTE.   >499 ng/ml (FEU) : VTE not excluded; additional work up for VTE is                      required. <> Testing on Inpatients and Evaluation of Disseminated Intravascular   Coagulation (DIC) Reference Range:   0-499 ng/ml (FEU) Performed at [facility], [address] [place], [place] [address]   Blood gas, venous     Status: Abnormal   Collection Time: 02/06/2019  6:14 PM  Result Value Ref Range   pH, Ven 7.36 7.250 - 7.430   pCO2, Ven 52 44.0 - 60.0 mmHg   pO2, Ven <31.0 (LL) 32.0 - 45.0 mmHg    Bicarbonate 29.4 (H) 20.0 - 28.0 mmol/L   Acid-Base Excess 2.8 (H) 0.0 - 2.0 mmol/L   O2 Saturation 26.9 %   Patient temperature 37.0    Collection site VENOUS     Comment: Performed at [facility], [address] [place],  [address]   Dg Chest 2 View  Result Date: 01/22/2019 CLINICAL DATA:  78 y/o  M; EXAM: CHEST - 2 VIEW COMPARISON:  01/10/2019 chest radiograph FINDINGS: Normal cardiac silhouette. Aortic atherosclerosis with calcification. Clear lungs. Stable small bilateral pleural effusions. No pneumothorax. No acute osseous abnormality is evident. IMPRESSION: Stable small bilateral pleural effusions. Electronically Signed   By: Kristine Garbe M.D.   On: 01/25/2019 17:24   US Paracentesis  Result Date: 01/13/2019 INDICATION: 78 year old with cirrhosis and recurrent ascites. Patient complains of shortness of breath. EXAM: ULTRASOUND GUIDED THERAPEUTIC PARACENTESIS MEDICATIONS: None. COMPLICATIONS: None immediate. PROCEDURE: Informed written consent was obtained from the patient after a discussion of the risks, benefits and alternatives to treatment. A timeout was performed prior to the initiation of the procedure. Initial ultrasound scanning demonstrates a large amount of ascites within the left lower abdominal quadrant. The left lower abdomen was prepped and draped in the usual  sterile fashion. 1% lidocaine was used for local anesthesia. Following this, a 6 Fr Safe-T-Centesis catheter was introduced. An ultrasound image was saved for documentation purposes. The paracentesis was performed. The catheter was removed and a dressing was applied. The patient tolerated the procedure well without immediate post procedural complication. FINDINGS: A total of approximately 3.65 L of yellow opaque fluid was removed. IMPRESSION: Successful ultrasound-guided paracentesis yielding 3.65 liters of peritoneal fluid. Electronically Signed   By: Markus Daft M.D.   On: 01/25/2019 11:37   Ir  Radiologist Eval & Mgmt  Result Date: 01/11/2019 Please refer to notes tab for details about interventional procedure. (Op Note)   Pending Labs [employer] (From admission, onward)    Start     Ordered   Signed and Held  CBC  (enoxaparin (LOVENOX)    CrCl >/= 30 ml/min)  Once,   R    Comments:  Baseline for enoxaparin therapy IF NOT ALREADY DRAWN.  Notify MD if PLT < 100 K.    Signed and Held   Signed and Held  Creatinine, serum  (enoxaparin (LOVENOX)    CrCl >/= 30 ml/min)  Once,   R    Comments:  Baseline for enoxaparin therapy IF NOT ALREADY DRAWN.    Signed and Held   Signed and Held  Creatinine, serum  (enoxaparin (LOVENOX)    CrCl >/= 30 ml/min)  Weekly,   R    Comments:  while on enoxaparin therapy    Signed and Held   Signed and Held  Basic metabolic panel  Tomorrow morning,   R     Signed and Held   Signed and Held  CBC  Tomorrow morning,   R     Signed and Held          Vitals/Pain Today's Vitals   01/25/2019 1930 01/08/2019 2000 01/31/2019 2030 01/08/2019 2038  BP:    (!) 153/75  Pulse: 86 93 92 100  Resp: (!) 32 (!) 24 (!) 25 19  Temp:      TempSrc:      SpO2: 98% 100% 100% 99%  Weight:      PainSc:        Isolation Precautions No active isolations  Medications Medications  simethicone (MYLICON) 40 IH/4.7QQ suspension 40 mg (40 mg Oral Given 01/31/2019 2035)    Mobility walks Low fall risk   Focused Assessments Pulmonary Assessment Handoff:  Lung sounds: Bilateral Breath Sounds: Diminished, Clear L Breath Sounds: Diminished, Clear R Breath Sounds: Clear, Diminished O2 Device: Room Air        R Recommendations: See Admitting Provider Note  Report given to:   Additional Notes: Pt A&Ox4, wife has been at bedside, pt has been getting up without assistance to use bathroom.  NM stated would be going for VQ scan around midnight tonight.

## 2019-01-12 NOTE — ED Notes (Signed)
Date and time results received: 02/01/2019 5:13 PM (use smartphrase ".now" to insert current time)  Test: Troponin Critical Value: 0.28  Name of Provider Notified: Dr. Jimmye Norman  Orders Received? Or Actions Taken?: No new orders at this time.

## 2019-01-12 NOTE — ED Provider Notes (Addendum)
[facility] Emergency Department Provider Note      Time seen: ----------------------------------------- 4:35 PM on 01/29/2019 -----------------------------------------   I have reviewed the triage vital signs and the nursing notes.  HISTORY   Chief Complaint Shortness of Breath   HPI Mason Wilson. is a 78 y.o. male with a history of coronary artery disease, cirrhosis, diabetes, GERD, hyperlipidemia, hypertension, melanoma, myasthenia who presents to the ED for respiratory distress.  Patient had a paracentesis today with a took 3 L off his abdominal cavity.  Patient reports he cannot breathe and feels severely short of breath.  He denies any flulike symptoms or cough.  His shortness of breath is worse when he lays down.  Past Medical History:  Diagnosis Date  . Allergy   . Arthritis    "everywhere' - worse in back  . Body mass index 39.0-39.9, adult   . CAD (coronary artery disease)   . Chronic back pain   . Cirrhosis, non-alcoholic ([place])   . Diabetes mellitus without complication ([place])   . Elevated liver enzymes   . GERD (gastroesophageal reflux disease)   . Heart murmur   . Hyperlipidemia   . Hypertension   . Hypogonadism in male   . Iron deficiency anemia due to chronic blood loss   . Melanoma ([place])    melanoma   . Melanoma in situ of right shoulder ([place])   . Myasthenia gravis ([place]) 01/2018  . Renal stones   . Sleep apnea    CPAP  . Uses hearing aid    doesn't wear    Patient Active Problem List   Diagnosis Date Noted  . Morbid obesity ([place]) 12/21/2018  . Ascites 12/15/2018  . Acute lower UTI 12/15/2018  . Prostate abscess   . Septic shock ([place]) 11/26/2018  . Senile purpura ([place]) 09/05/2018  . Portal hypertension ([place])   . Gastritis without bleeding   . Secondary esophageal varices without bleeding ([place])   . Benign neoplasm of ascending colon   . Benign neoplasm of descending colon   . Iron deficiency anemia due to chronic  blood loss 05/30/2018  . Splenomegaly 04/28/2018  . Myasthenia gravis without acute exacerbation ([place]) 03/02/2018  . Advanced care planning/counseling discussion 09/01/2017  . LFT elevation   . Acute renal failure (ARF) ([place]) 06/23/2016  . Essential hypertension 10/13/2015  . Back muscle spasm 09/15/2015  . 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 Hepatic cirrhosis ([place])   . Esophageal varices in cirrhosis (HCC)   . Cirrhosis ([place]) 08/11/2015  . Abnormal tumor markers 07/28/2015  . Pancytopenia ([place]) 07/28/2015  . Weight loss 07/28/2015  . 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 Obesity, diabetes, and hypertension syndrome ([place]) 07/08/2015  . CAD (coronary artery disease) 07/08/2015  . Chronic kidney disease 07/08/2015  . Hyperlipidemia 07/08/2015  . BMI 34.0-34.9,adult 07/08/2015  . BPH (benign prostatic hyperplasia) 07/08/2015  . Low back pain 07/08/2015    Past Surgical History:  Procedure Laterality Date  . ANGIOPLASTY  1999   3 stents  . CARDIAC CATHETERIZATION     stents placed  . CARPAL TUNNEL RELEASE     x2  . CATARACT EXTRACTION, BILATERAL    . COLONOSCOPY WITH PROPOFOL N/A 07/03/2018   Procedure: COLONOSCOPY WITH PROPOFOL;  Surgeon: Lucilla Lame, MD;  Location: [place];  Service: Endoscopy;  Laterality: N/A;  . ESOPHAGOGASTRODUODENOSCOPY (EGD) WITH PROPOFOL N/A 09/08/2015   Procedure: ESOPHAGOGASTRODUODENOSCOPY (EGD) WITH PROPOFOL;  Surgeon: Lucilla Lame, MD;  Location: [place];  Service: Endoscopy;  Laterality: N/A;  CPAP Diabetic - oral meds  . ESOPHAGOGASTRODUODENOSCOPY (EGD) WITH PROPOFOL N/A 07/03/2018   Procedure: ESOPHAGOGASTRODUODENOSCOPY (EGD) WITH PROPOFOL;  Surgeon: Lucilla Lame, MD;  Location: [place];  Service: Endoscopy;  Laterality: N/A;  diabetic - oral meds sleep apnea  . EYE SURGERY  cataracts  . IR RADIOLOGIST EVAL & MGMT  01/11/2019  . LIVER BIOPSY    . POLYPECTOMY N/A 07/03/2018    Procedure: POLYPECTOMY;  Surgeon: Lucilla Lame, MD;  Location: [place];  Service: Endoscopy;  Laterality: N/A;  . SKIN LESION EXCISION  1998  . SKIN LESION EXCISION     skin cancer removed on forehead in June 2018  . TONSILLECTOMY     age 37  . TRANSURETHRAL RESECTION OF PROSTATE N/A 11/26/2018   Procedure: TRANSURETHRAL RESECTION OF THE PROSTATE (TURP);  Surgeon: Irine Seal, MD;  Location: [facility];  Service: Urology;  Laterality: N/A;  . TRANSURETHRAL RESECTION OF PROSTATE N/A 12/17/2018   Procedure: TRANSURETHRAL RESECTION OF THE PROSTATE (TURP),cystoscopy,clot evacuation;  Surgeon: Lucas Mallow, MD;  Location: [facility];  Service: Urology;  Laterality: N/A;    Allergies Altace [ramipril] and Ciprofloxacin  Social History Social History   Tobacco Use  . Smoking status: Former Smoker    Packs/day: 3.00    Years: 0.00    Pack years: 0.00    Types: Cigarettes    Last attempt to quit: 04/02/1970    Years since quitting: 48.8  . Smokeless tobacco: Former Systems developer    Types: Chew  . Tobacco comment: approx quit in 1971   Substance Use Topics  . Alcohol use: No    Alcohol/week: 0.0 standard drinks    Comment: stopped in 1996  . Drug use: No   Review of Systems Constitutional: Negative for fever. Cardiovascular: Negative for chest pain. Respiratory: Positive for shortness of breath Gastrointestinal: Negative for abdominal pain, vomiting and diarrhea. Musculoskeletal: Negative for back pain. Skin: Negative for rash. Neurological: Negative for headaches, focal weakness or numbness.  All systems negative/normal/unremarkable except as stated in the HPI  ____________________________________________   PHYSICAL EXAM:  VITAL SIGNS: ED Triage Vitals  Enc Vitals Group     BP 02/04/2019 1601 (!) 160/71     Pulse Rate 02/04/2019 1601 83     Resp 02/01/2019 1601 (!) 33     Temp 01/07/2019 1601 97.9 F (36.6 C)     Temp Source 01/24/2019 1601 Oral     SpO2 01/16/2019 1601 100  %     Weight 01/22/2019 1602 213 lb 11.1 oz (96.9 kg)     Height --      Head Circumference --      Peak Flow --      Pain Score 01/27/2019 1602 0     Pain Loc --      Pain Edu? --      Excl. in [place]? --    Constitutional: Alert and oriented.  Mild to moderate distress Eyes: Conjunctivae are normal. Normal extraocular movements. ENT      Head: Normocephalic and atraumatic.      Nose: No congestion/rhinnorhea.      Mouth/Throat: Mucous membranes are moist.      Neck: No stridor. Cardiovascular: Normal rate, regular rhythm. No murmurs, rubs, or gallops. Respiratory: Tachypnea with mostly clear breath sounds Gastrointestinal: Mild distention with some ascites Musculoskeletal: Nontender with normal range of motion in extremities. No lower extremity tenderness nor edema. Neurologic:  Normal speech and language. No gross focal neurologic deficits are appreciated.  Skin:  Skin is warm, dry and intact. No rash noted. Psychiatric: Mood and affect are normal. Speech and behavior are normal.  ____________________________________________  EKG: Interpreted by me.  Sinus rhythm rate of 85 bpm, normal PR interval, wide QRS, left axis deviation  ____________________________________________  ED COURSE:  As part of my medical decision making, I reviewed the following data within the [place] History obtained from family if available, nursing notes, old chart and ekg, as well as notes from prior ED visits. Patient presented for respiratory distress, we will assess with labs and imaging as indicated at this time.   Procedures ____________________________________________   LABS (pertinent positives/negatives)  Labs Reviewed  CBC WITH DIFFERENTIAL/PLATELET - Abnormal; Notable for the following components:      Result Value   RBC 3.67 (*)    Hemoglobin 11.2 (*)    HCT 34.7 (*)    Platelets 124 (*)    Lymphs Abs 0.6 (*)    All other components within normal limits  TROPONIN I -  Abnormal; Notable for the following components:   Troponin I 0.28 (*)    All other components within normal limits  COMPREHENSIVE METABOLIC PANEL - Abnormal; Notable for the following components:   Glucose, Bld 167 (*)    BUN 32 (*)    Creatinine, Ser 2.22 (*)    Albumin 3.3 (*)    AST 127 (*)    ALT 78 (*)    Alkaline Phosphatase 138 (*)    GFR calc non Af Amer 28 (*)    GFR calc Af Amer 32 (*)    All other components within normal limits  FIBRIN DERIVATIVES D-DIMER ([facility] ONLY) - Abnormal; Notable for the following components:   Fibrin derivatives D-dimer ([facility]) 5,034.70 (*)    All other components within normal limits  BLOOD GAS, VENOUS - Abnormal; Notable for the following components:   pO2, Ven <31.0 (*)    Bicarbonate 29.4 (*)    Acid-Base Excess 2.8 (*)    All other components within normal limits  BRAIN NATRIURETIC PEPTIDE   CRITICAL CARE Performed by: Laurence Aly   Total critical care time: 30 minutes  Critical care time was exclusive of separately billable procedures and treating other patients.  Critical care was necessary to treat or prevent imminent or life-threatening deterioration.  Critical care was time spent personally by me on the following activities: development of treatment plan with patient and/or surrogate as well as nursing, discussions with consultants, evaluation of patient's response to treatment, examination of patient, obtaining history from patient or surrogate, ordering and performing treatments and interventions, ordering and review of laboratory studies, ordering and review of radiographic studies, pulse oximetry and re-evaluation of patient's condition.  RADIOLOGY Images were viewed by me  Chest x-ray IMPRESSION: Stable small bilateral pleural effusions. ____________________________________________   DIFFERENTIAL DIAGNOSIS   Pleural effusion, PE, pneumothorax, pneumonia, influenza  FINAL ASSESSMENT AND PLAN  Dyspnea,  myasthenia crisis   Plan: The patient had presented for shortness of breath. Patient's labs did reveal a chronically elevated troponin but no other acute process. Patient's imaging reassuring however I will have ordered a VQ scan.  D-dimer was similarly elevated earlier this year with no obvious PE identified.  He may require IVIG for myasthenic crisis.  I have discussed with neurology.  Typically the dosing would be 400 mg/kg/day and a treatment course will be for 5 days.  I  will discuss with ICU because his negative inspiratory force has been consistently less than 20.   Laurence Aly, MD    Note: This note was generated in part or whole with voice recognition software. Voice recognition is usually quite accurate but there are transcription errors that can and very often do occur. I apologize for any typographical errors that were not detected and corrected.     Earleen Newport, MD 01/11/2019 1901    Earleen Newport, MD 01/25/2019 [phone]

## 2019-01-13 DIAGNOSIS — G7001 Myasthenia gravis with (acute) exacerbation: Principal | ICD-10-CM

## 2019-01-13 LAB — BASIC METABOLIC PANEL
Anion gap: 11 (ref 5–15)
BUN: 34 mg/dL — ABNORMAL HIGH (ref 8–23)
CO2: 25 mmol/L (ref 22–32)
Calcium: 9.2 mg/dL (ref 8.9–10.3)
Chloride: 104 mmol/L (ref 98–111)
Creatinine, Ser: 2.22 mg/dL — ABNORMAL HIGH (ref 0.61–1.24)
GFR calc Af Amer: 32 mL/min — ABNORMAL LOW (ref >60)
GFR calc non Af Amer: 28 mL/min — ABNORMAL LOW (ref >60)
Glucose, Bld: 165 mg/dL — ABNORMAL HIGH (ref 70–99)
Potassium: 4.9 mmol/L (ref 3.5–5.1)
SODIUM: 140 mmol/L (ref 135–145)

## 2019-01-13 LAB — CBC
HCT: 36.6 % — ABNORMAL LOW (ref 39.0–52.0)
Hemoglobin: 11.5 g/dL — ABNORMAL LOW (ref 13.0–17.0)
MCH: 29.6 pg (ref 26.0–34.0)
MCHC: 31.4 g/dL (ref 30.0–36.0)
MCV: 94.3 fL (ref 80.0–100.0)
Platelets: 112 10*3/uL — ABNORMAL LOW (ref 150–400)
RBC: 3.88 MIL/uL — ABNORMAL LOW (ref 4.22–5.81)
RDW: 14.6 % (ref 11.5–15.5)
WBC: 4 10*3/uL (ref 4.0–10.5)
nRBC: 0 % (ref 0.0–0.2)

## 2019-01-13 LAB — PHOSPHORUS: Phosphorus: 4.6 mg/dL (ref 2.5–4.6)

## 2019-01-13 LAB — MAGNESIUM: Magnesium: 2.1 mg/dL (ref 1.7–2.4)

## 2019-01-13 LAB — MRSA PCR SCREENING: MRSA by PCR: NEGATIVE

## 2019-01-13 LAB — TROPONIN I: Troponin I: 0.35 ng/mL (ref <0.03)

## 2019-01-13 MED ORDER — MORPHINE 100MG IN NS 100ML (1MG/ML) PREMIX INFUSION
1.0000 mg/h | INTRAVENOUS | Status: DC
  Administered 2019-01-13: 1 mg/h via INTRAVENOUS
  Administered 2019-01-15: 3 mg/h via INTRAVENOUS
  Filled 2019-01-13 (×2): qty 100

## 2019-01-13 MED ORDER — GLYCOPYRROLATE 0.2 MG/ML IJ SOLN
0.2000 mg | INTRAMUSCULAR | Status: DC | PRN
  Filled 2019-01-13: qty 1

## 2019-01-13 MED ORDER — METOPROLOL SUCCINATE ER 50 MG PO TB24
50.0000 mg | ORAL_TABLET | Freq: Every day | ORAL | Status: DC
  Administered 2019-01-13: 50 mg via ORAL
  Filled 2019-01-13: qty 1

## 2019-01-13 MED ORDER — METHYLPREDNISOLONE SODIUM SUCC 125 MG IJ SOLR
125.0000 mg | Freq: Once | INTRAMUSCULAR | Status: AC
  Administered 2019-01-13: 125 mg via INTRAVENOUS
  Filled 2019-01-13: qty 2

## 2019-01-13 MED ORDER — POLYVINYL ALCOHOL 1.4 % OP SOLN
1.0000 [drp] | Freq: Four times a day (QID) | OPHTHALMIC | Status: DC | PRN
  Filled 2019-01-13: qty 15

## 2019-01-13 MED ORDER — METHYLPREDNISOLONE SODIUM SUCC 125 MG IJ SOLR: 60.0000 mg | Freq: Two times a day (BID) | INTRAMUSCULAR | Status: DC

## 2019-01-13 MED ORDER — LORAZEPAM 1 MG PO TABS: 1.0000 mg | ORAL_TABLET | ORAL | Status: DC | PRN

## 2019-01-13 MED ORDER — LORAZEPAM 2 MG/ML PO CONC: 1.0000 mg | ORAL | Status: DC | PRN

## 2019-01-13 MED ORDER — FUROSEMIDE 10 MG/ML IJ SOLN
40.0000 mg | Freq: Once | INTRAMUSCULAR | Status: AC
  Administered 2019-01-13: 40 mg via INTRAVENOUS
  Filled 2019-01-13: qty 4

## 2019-01-13 MED ORDER — LORAZEPAM 2 MG/ML IJ SOLN
1.0000 mg | INTRAMUSCULAR | Status: DC | PRN
  Administered 2019-01-14 – 2019-01-15 (×2): 1 mg via INTRAVENOUS
  Filled 2019-01-13 (×2): qty 1

## 2019-01-13 MED ORDER — CHLORHEXIDINE GLUCONATE 0.12 % MT SOLN: 15.0000 mL | Freq: Two times a day (BID) | OROMUCOSAL | Status: DC

## 2019-01-13 MED ORDER — MORPHINE SULFATE (PF) 2 MG/ML IV SOLN
1.0000 mg | INTRAVENOUS | Status: DC | PRN
  Administered 2019-01-13 (×5): 1 mg via INTRAVENOUS
  Filled 2019-01-13 (×4): qty 1

## 2019-01-13 MED ORDER — ORAL CARE MOUTH RINSE
15.0000 mL | Freq: Two times a day (BID) | OROMUCOSAL | Status: DC
  Administered 2019-01-14 – 2019-01-15 (×2): 15 mL via OROMUCOSAL

## 2019-01-13 MED ORDER — MORPHINE SULFATE (PF) 2 MG/ML IV SOLN: 1.0000 mg | INTRAVENOUS | Status: DC | PRN

## 2019-01-13 MED ORDER — GLYCOPYRROLATE 1 MG PO TABS
1.0000 mg | ORAL_TABLET | ORAL | Status: DC | PRN
  Administered 2019-01-14: 1 mg via ORAL
  Filled 2019-01-13 (×2): qty 1

## 2019-01-13 MED ORDER — BIOTENE DRY MOUTH MT LIQD: 15.0000 mL | OROMUCOSAL | Status: DC | PRN

## 2019-01-13 NOTE — Progress Notes (Signed)
[facility] [place] NAME: Dutch Ing    MR#:  [id number]  DATE OF BIRTH:  [birth year]  SUBJECTIVE:  CHIEF COMPLAINT:   Chief Complaint  Patient presents with  . Shortness of Breath   No new complaints.  No fevers.  Still has some shortness of breath.  Requiring supplemental oxygen via nasal cannula.  Notified by critical care physician that patient has made decision to be kept comfortable going forward without any aggressive management.  Patient and wife at bedside also confirmed the same.  Consultation for neurology already consulted.  REVIEW OF SYSTEMS:  Review of Systems  Constitutional: Negative for chills and fever.  HENT: Negative for hearing loss and tinnitus.   Eyes: Negative for blurred vision and double vision.  Respiratory: Positive for shortness of breath. Negative for cough.   Cardiovascular: Negative for chest pain and palpitations.  Gastrointestinal: Negative for heartburn and nausea.  Genitourinary: Negative for dysuria and urgency.  Musculoskeletal: Negative for myalgias and neck pain.  Skin: Negative for itching and rash.  Neurological: Negative for dizziness and headaches.  Psychiatric/Behavioral: Negative for depression and hallucinations.    DRUG ALLERGIES:   Allergies  Allergen Reactions  . Altace [Ramipril] Cough  . Ciprofloxacin Other (See Comments)    Contraindicated in patients with myasthenia gravis    VITALS:  Blood pressure (!) 164/86, pulse (!) 129, temperature 98.2 F (36.8 C), temperature source Oral, resp. rate (!) 35, height 5\' 10"  (1.778 m), weight 95.3 kg, SpO2 100 %. PHYSICAL EXAMINATION:    Physical Exam  Constitutional: He is oriented to person, place, and time. He appears well-developed and well-nourished.  HENT:  Head: Normocephalic and atraumatic.  Right Ear: External ear normal.  Eyes: Pupils are equal, round, and reactive to light. Conjunctivae and EOM are normal.  Neck: Normal  range of motion. Neck supple. No tracheal deviation present.  Cardiovascular: Normal rate, regular rhythm and normal heart sounds.  Respiratory: Effort normal. No stridor. No respiratory distress. He has no wheezes.  GI: Soft. Bowel sounds are normal. There is no abdominal tenderness.  Musculoskeletal: Normal range of motion.        General: No edema.  Neurological: He is alert and oriented to person, place, and time. He has normal reflexes. No cranial nerve deficit.  Skin: Skin is warm. No erythema.  Psychiatric: He has a normal mood and affect. His behavior is normal.   LABORATORY PANEL:  Male CBC Recent Labs  Lab 01/13/19 0337  WBC 4.0  HGB 11.5*  HCT 36.6*  PLT 112*   ------------------------------------------------------------------------------------------------------------------ Chemistries  Recent Labs  Lab 01/14/2019 1607 01/13/19 0337  NA 138 140  K 4.8 4.9  CL 103 104  CO2 27 25  GLUCOSE 167* 165*  BUN 32* 34*  CREATININE 2.22* 2.22*  CALCIUM 9.3 9.2  MG  --  2.1  AST 127*  --   ALT 78*  --   ALKPHOS 138*  --   BILITOT 0.9  --    RADIOLOGY:  Dg Chest 2 View  Result Date: 02/01/2019 CLINICAL DATA:  78 y/o  M; EXAM: CHEST - 2 VIEW COMPARISON:  01/10/2019 chest radiograph FINDINGS: Normal cardiac silhouette. Aortic atherosclerosis with calcification. Clear lungs. Stable small bilateral pleural effusions. No pneumothorax. No acute osseous abnormality is evident. IMPRESSION: Stable small bilateral pleural effusions. Electronically Signed   By: Kristine Garbe M.D.   On: 01/24/2019 17:24   ASSESSMENT AND PLAN:   1.  Myasthenia gravis in crisis  Patient was admitted to stepdown unit  Already evaluated by critical care physician prior to my arrival.  Patient has decided to be kept comfortable going forward without any aggressive care.  With patient and wife at bedside confirmed the same.  Neurology consult noted to have been canceled in accordance with  patient's wishes.   Palliative care consult placed to keep patient comfortable going forward.    2.  History of liver cirrhosis with ascites.  Status post recent paracentesi on 01/26/2019 with 3.65 L taken out..   Was evaluated as outpatient for possible TIPS procedure but this was placed on hold due to underlying renal failure  Patient has decided to be kept comfortable going forward   3.  Mildly elevated troponin   4.  Chronic kidney disease stage IV  Renal function appears stable.  Avoid nephrotoxic agents.    5. BPH (benign prostatic hyperplasia) -continue home medications  DVT prophylaxis; Lovenox  All the records are reviewed and case discussed with Care Management/Social Worker. Management plans discussed with the patient, family and they are in agreement.  CODE STATUS: DNR  TOTAL TIME TAKING CARE OF THIS PATIENT: 39 minutes.   More than 50% of the time was spent in counseling/coordination of care: YES  POSSIBLE D/C IN 1-2 DAYS, DEPENDING ON CLINICAL CONDITION.   Taron Mondor M.D on 01/13/2019 at 1:36 PM  Between 7am to 6pm - Pager - [phone]  After 6pm go to [email or web address] - Technical brewer [facility]  Office  [phone]  CC: Primary care physician; No primary care provider on file.  Note: This dictation was prepared with Dragon dictation along with smaller phrase technology. Any transcriptional errors that result from this process are unintentional.

## 2019-01-13 NOTE — Progress Notes (Signed)
Nif completed -16 tolerated well

## 2019-01-13 NOTE — Progress Notes (Signed)
NIF= -15 

## 2019-01-13 NOTE — Consult Note (Signed)
Arrived to do consult, but RN informed me that the consult was cancelled.  Patient is DNR.     Rogue Jury, MS, MD

## 2019-01-13 NOTE — Consult Note (Signed)
PULMONARY / CRITICAL CARE MEDICINE  Name: Mason Wilson. MRN: [id number] DOB: Jun 02, 1941    LOS: 1  Referring Provider: Dr. Jannifer [facility] Reason for Referral: Acute exacerbation of myasthenia gravis and dyspnea Brief patient description: 78 year old male admitted with acute exacerbation of myasthenia gravis and worsening dyspnea.  HPI: 78 year old male with a medical history as indicated below who presented to the ED with worsening shortness of breath.  He had a paracentesis done yesterday and 3 L of fluid were taken out.  He reported very minimal improvement in symptoms post paracentesis.  He returned to the emergency room with worsening dyspnea.  His chest x-ray was negative for pneumonia.  His d-dimer and troponin were elevated hence a VQ scan was ordered but he was unable to lie down for the scan.  His NIF was less than 20 hence he was admitted to the ICU for close monitoring of respiratory status. He denies chest pain, palpitations, nausea, vomiting but reports abdominal distention, and progressive dyspnea He was hospitalized on 12/15/2022 with urosepsis, prostate abscess, acute renal failure and septic shock.  He states that since discharge, he has not felt well.   Past Medical History:  Diagnosis Date  . Allergy   . Arthritis    "everywhere' - worse in back  . Body mass index 39.0-39.9, adult   . CAD (coronary artery disease)   . Chronic back pain   . Cirrhosis, non-alcoholic ([place])   . Diabetes mellitus without complication ([place])   . Elevated liver enzymes   . GERD (gastroesophageal reflux disease)   . Heart murmur   . Hyperlipidemia   . Hypertension   . Hypogonadism in male   . Iron deficiency anemia due to chronic blood loss   . Melanoma ([place])    melanoma   . Melanoma in situ of right shoulder ([place])   . Myasthenia gravis ([place]) 01/2018  . Renal stones   . Sleep apnea    CPAP  . Uses hearing aid    doesn't wear   Past Surgical History:  Procedure Laterality Date  .  ANGIOPLASTY  1999   3 stents  . CARDIAC CATHETERIZATION     stents placed  . CARPAL TUNNEL RELEASE     x2  . CATARACT EXTRACTION, BILATERAL    . COLONOSCOPY WITH PROPOFOL N/A 07/03/2018   Procedure: COLONOSCOPY WITH PROPOFOL;  Surgeon: Lucilla Lame, MD;  Location: [facility];  Service: Endoscopy;  Laterality: N/A;  . ESOPHAGOGASTRODUODENOSCOPY (EGD) WITH PROPOFOL N/A 09/08/2015   Procedure: ESOPHAGOGASTRODUODENOSCOPY (EGD) WITH PROPOFOL;  Surgeon: Lucilla Lame, MD;  Location: [facility];  Service: Endoscopy;  Laterality: N/A;  CPAP Diabetic - oral meds  . ESOPHAGOGASTRODUODENOSCOPY (EGD) WITH PROPOFOL N/A 07/03/2018   Procedure: ESOPHAGOGASTRODUODENOSCOPY (EGD) WITH PROPOFOL;  Surgeon: Lucilla Lame, MD;  Location: [facility];  Service: Endoscopy;  Laterality: N/A;  diabetic - oral meds sleep apnea  . EYE SURGERY  cataracts  . IR RADIOLOGIST EVAL & MGMT  01/11/2019  . LIVER BIOPSY    . POLYPECTOMY N/A 07/03/2018   Procedure: POLYPECTOMY;  Surgeon: Lucilla Lame, MD;  Location: [facility];  Service: Endoscopy;  Laterality: N/A;  . SKIN LESION EXCISION  1998  . SKIN LESION EXCISION     skin cancer removed on forehead in June 2018  . TONSILLECTOMY     age 41  . TRANSURETHRAL RESECTION OF PROSTATE N/A 11/26/2018   Procedure: TRANSURETHRAL RESECTION OF THE PROSTATE (TURP);  Surgeon: Irine Seal, MD;  Location: [facility]  ORS;  Service: Urology;  Laterality: N/A;  . TRANSURETHRAL RESECTION OF PROSTATE N/A 12/17/2018   Procedure: TRANSURETHRAL RESECTION OF THE PROSTATE (TURP),cystoscopy,clot evacuation;  Surgeon: Lucas Mallow, MD;  Location: [facility] ORS;  Service: Urology;  Laterality: N/A;   Prior to Admission medications   Medication Sig Start Date End Date Taking? Authorizing Provider  amLODipine (NORVASC) 5 MG tablet Take 5 mg by mouth daily.   Yes [provider]  clopidogrel (PLAVIX) 75 MG tablet Take 75 mg by mouth daily.   Yes [provider]   donepezil (ARICEPT) 5 MG tablet Take 1 tablet (5 mg total) by mouth at bedtime. 07/03/18 08/12/18 Yes Sowles, Drue Stager, MD  empagliflozin (JARDIANCE) 25 MG TABS tablet Take 25 mg by mouth daily.   Yes [provider]  glycopyrrolate (ROBINUL) 1 MG tablet Take 1 mg by mouth 2 (two) times daily.   Yes [provider]  insulin aspart (NOVOLOG FLEXPEN) 100 UNIT/ML FlexPen Inject 12 Units into the skin 2 (two) times daily.   Yes [provider]  insulin aspart (NOVOLOG) 100 UNIT/ML FlexPen Inject 18 Units into the skin daily. At 1700   Yes [provider]  Insulin Degludec-Liraglutide (XULTOPHY) 100-3.6 UNIT-MG/ML SOPN Inject 50 Units into the skin daily.   Yes [provider]  levETIRAcetam (KEPPRA) 500 MG tablet Take 500 mg by mouth 2 (two) times daily.   Yes [provider]  lipase/protease/amylase (CREON) 12000 units CPEP capsule Take 6,000 Units by mouth 3 (three) times daily before meals.   Yes [provider]  lipase/protease/amylase (CREON) 12000 units CPEP capsule Take 3,000 Units by mouth at bedtime. With snack   Yes [provider]  lisinopril (PRINIVIL,ZESTRIL) 5 MG tablet Take 5 mg by mouth daily.   Yes [provider]  metoprolol succinate (TOPROL-XL) 25 MG 24 hr tablet Take 1 tablet (25 mg total) by mouth daily. 07/03/18  Yes Sowles, Drue Stager, MD  rosuvastatin (CRESTOR) 40 MG tablet Take 1 tablet (40 mg total) by mouth daily. 07/03/18 08/12/18 Yes Steele Sizer, MD  aspirin EC 81 MG tablet Take 81 mg by mouth daily.    [provider]  famotidine (PEPCID) 20 MG tablet Take 1 tablet (20 mg total) by mouth 2 (two) times daily. 07/03/18 08/02/18  Steele Sizer, MD  gabapentin (NEURONTIN) 300 MG capsule Take 1 capsule (300 mg total) by mouth 2 (two) times daily. 07/03/18 08/02/18  Steele Sizer, MD  insulin glargine (LANTUS) 100 UNIT/ML injection Inject 0.1 mLs (10 Units total) into the skin daily. 07/03/18  08/02/18  Steele Sizer, MD  lacosamide 100 MG TABS Take 1 tablet (100 mg total) by mouth 2 (two) times daily. Patient not taking: Reported on 08/12/2018 11/18/17   Fritzi Mandes, MD  promethazine (PHENERGAN) 12.5 MG tablet Take 1 tablet (12.5 mg total) by mouth every 6 (six) hours as needed for nausea or vomiting. Patient not taking: Reported on 08/12/2018 09/06/17   Stark Klein, MD  sertraline (ZOLOFT) 25 MG tablet Take 1 tablet (25 mg total) by mouth daily. Patient not taking: Reported on 08/12/2018 07/03/18   Steele Sizer, MD   Allergies Allergies  Allergen Reactions  . Altace [Ramipril] Cough  . Ciprofloxacin Other (See Comments)    Contraindicated in patients with myasthenia gravis     Family History Family History  Problem Relation Age of Onset  . Thyroid disease Mother   . Alzheimer's disease Mother   . Heart disease Mother   . Cancer Father 58  testicular  . Heart disease Father   . Hypertension Father   . Heart attack Father   . Kidney disease Neg Hx   . Prostate cancer Neg Hx   . Kidney cancer Neg Hx   . Bladder Cancer Neg Hx    Social History  reports that he quit smoking about 48 years ago. His smoking use included cigarettes. He smoked 3.00 packs per day for 0.00 years. He has quit using smokeless tobacco.  His smokeless tobacco use included chew. He reports that he does not drink alcohol or use drugs.  Review Of Systems:   Constitutional: Negative for fever and chills.  HENT: Negative for congestion and rhinorrhea.  Eyes: Negative for redness and visual disturbance.  Respiratory: Positive for shortness of breath Cardiovascular: Negative for chest pain and palpitations.  Gastrointestinal: Negative  for nausea , vomiting and abdominal pain but positive for abdominal distention and ascites Genitourinary: Negative for dysuria and urgency.  Endocrine: Denies polyuria, polyphagia and heat intolerance Musculoskeletal: Positive for difficulty walking and  unsteady gait Skin: Negative for pallor and wound.  Neurological: Negative for dizziness and headaches   VITAL SIGNS: BP (!) 158/79   Pulse (!) 115   Temp 98.4 F (36.9 C) (Oral)   Resp (!) 33   Ht 5\' 10"  (1.778 m)   Wt 95.3 kg   SpO2 100%   BMI 30.15 kg/m   HEMODYNAMICS:    VENTILATOR SETTINGS:    INTAKE / OUTPUT: No intake/output data recorded.  PHYSICAL EXAMINATION: General: Acutely ill looking, in moderate respiratory distress HEENT: PERRLA, trachea midline, no JVD Neuro: Alert and oriented x3, no focal deficits Cardiovascular: Apical pulse mildly tachycardic, regular, S1-S2, no murmur regurg or gallop, +2 pulses bilaterally, trace edema Lungs: Increased work of breathing, short expiratory span, bilateral breath sounds with no wheezes or rhonchi Abdomen: Distended, normal bowel sounds, palpation reveals no organomegaly Musculoskeletal: Positive range of motion, no joint deformities Skin: Warm and dry  LABS:  BMET Recent Labs  Lab 01/10/19 1154 01/10/2019 1607  NA 139 138  K 5.0 4.8  CL 102 103  CO2 26 27  BUN 23 32*  CREATININE 2.15* 2.22*  GLUCOSE 148* 167*    Electrolytes Recent Labs  Lab 01/10/19 1154 01/13/2019 1607  CALCIUM 9.4 9.3    CBC Recent Labs  Lab 01/29/2019 1607  WBC 4.9  HGB 11.2*  HCT 34.7*  PLT 124*    Coag's Recent Labs  Lab 01/10/19 1154  INR 1.2    Sepsis Markers No results for input(s): LATICACIDVEN, PROCALCITON, O2SATVEN in the last 168 hours.  ABG No results for input(s): PHART, PCO2ART, PO2ART in the last 168 hours.  Liver Enzymes Recent Labs  Lab 01/10/19 1154 01/20/2019 1607  AST 163* 127*  ALT 79* 78*  ALKPHOS 170* 138*  BILITOT 1.1 0.9  ALBUMIN 3.6 3.3*    Cardiac Enzymes Recent Labs  Lab 01/08/2019 1607  TROPONINI 0.28*    Glucose Recent Labs  Lab 02/04/2019 2310  GLUCAP 314*    Imaging Dg Chest 2 View  Result Date: 01/13/2019 CLINICAL DATA:  78 y/o  M; EXAM: CHEST - 2 VIEW COMPARISON:   01/10/2019 chest radiograph FINDINGS: Normal cardiac silhouette. Aortic atherosclerosis with calcification. Clear lungs. Stable small bilateral pleural effusions. No pneumothorax. No acute osseous abnormality is evident. IMPRESSION: Stable small bilateral pleural effusions. Electronically Signed   By: Kristine Garbe M.D.   On: 02/04/2019 17:24   US Paracentesis  Result Date: 01/21/2019 INDICATION: 78 year old with  cirrhosis and recurrent ascites. Patient complains of shortness of breath. EXAM: ULTRASOUND GUIDED THERAPEUTIC PARACENTESIS MEDICATIONS: None. COMPLICATIONS: None immediate. PROCEDURE: Informed written consent was obtained from the patient after a discussion of the risks, benefits and alternatives to treatment. A timeout was performed prior to the initiation of the procedure. Initial ultrasound scanning demonstrates a large amount of ascites within the left lower abdominal quadrant. The left lower abdomen was prepped and draped in the usual sterile fashion. 1% lidocaine was used for local anesthesia. Following this, a 6 Fr Safe-T-Centesis catheter was introduced. An ultrasound image was saved for documentation purposes. The paracentesis was performed. The catheter was removed and a dressing was applied. The patient tolerated the procedure well without immediate post procedural complication. FINDINGS: A total of approximately 3.65 L of yellow opaque fluid was removed. IMPRESSION: Successful ultrasound-guided paracentesis yielding 3.65 liters of peritoneal fluid. Electronically Signed   By: Markus Daft M.D.   On: 01/14/2019 11:37   STUDIES:  IR consult for repeat paracentesis  CULTURES: None  ANTIBIOTICS: None  SIGNIFICANT EVENTS: 01/13/2019: Admitted  LINES/TUBES: Peripheral line  ASSESSMENT Progressive dyspnea Acute exacerbation of myasthenia gravis Acute on chronic renal failure Liver cirrhosis with ascites Elevated troponin Elevated d-dimer Obstructive sleep  apnea  PLAN  Unfortunately patient cannot lie down for VQ scan and his creatinine is elevated hence he cannot have a CT angiogram of his chest to rule out a PE We will have IR reevaluate his ascites and if there is no need for repeat paracentesis then we will consider starting patient on heparin empirically for a presumptive PE if no other causes of his dyspnea can be identified Awaiting neurology consult for IVIG Supportive care with supplemental oxygen, morphine, and steroids  Mandatory nocturnal BiPAP Cycle cardiac enzymes Trend creatinine Monitor and correct electrolytes Patient is on high risk for intubation NIF q12 hrs  Best Practice: Code Status: DNR Diet: N.p.o. with sips and meds GI prophylaxis: Not indicated VTE prophylaxis: Subcu Lovenox  FAMILY  - Updates: Patient updated on current treatment plan.  No family at bedside.  Will update when available.  Geraldina Parrott S. Tukov-Yual ANP-BC [facility] [place] Pager [phone] or [phone]  NB: This document was prepared using Dragon voice recognition software and may include unintentional dictation errors.    01/13/2019, 12:10 AM

## 2019-01-13 NOTE — Progress Notes (Addendum)
Pt has remained alert and oriented with no c/o pain. Pt has remained in ST on cardiac monitor. Pt transitioned to HFNC for flow comfort-currently 80%. Pt reports that HFNC helps his breathing feel better. Pt has required his PRN 1mg  morphine q 2hrs as ordered for WOB. Pt lungs sound clear, but are very diminished to auscultation. Pt breaths are very shallow and rapid. Morphine gtt initiated at 1mg /ml. Family have remained at bedside.

## 2019-01-14 NOTE — Progress Notes (Signed)
[place] at [place] NAME: Mason Wilson    MR#:  [id number]  DATE OF BIRTH:  1941-11-06  SUBJECTIVE:  CHIEF COMPLAINT:   Chief Complaint  Patient presents with  . Shortness of Breath   No new complaints.  No fevers.  Still has some shortness of breath.  Oxygen requirement increased.  Patient had to be placed on high flow oxygen via nasal cannula at 90% for comfort.  Oxygen saturation was 87%.  Patient already started on morphine drip for comfort in accordance with patient's wishes.  Being transferred out of ICU today.   REVIEW OF SYSTEMS:  Review of Systems  Constitutional: Negative for chills and fever.  HENT: Negative for hearing loss and tinnitus.   Eyes: Negative for blurred vision and double vision.  Respiratory: Positive for shortness of breath. Negative for cough.   Cardiovascular: Negative for chest pain and palpitations.  Gastrointestinal: Negative for heartburn and nausea.  Genitourinary: Negative for dysuria and urgency.  Musculoskeletal: Negative for myalgias and neck pain.  Skin: Negative for itching and rash.  Neurological: Negative for dizziness and headaches.  Psychiatric/Behavioral: Negative for depression and hallucinations.    DRUG ALLERGIES:   Allergies  Allergen Reactions  . Altace [Ramipril] Cough  . Ciprofloxacin Other (See Comments)    Contraindicated in patients with myasthenia gravis    VITALS:  Blood pressure (!) 143/63, pulse (!) 55, temperature (!) 97.5 F (36.4 C), temperature source Oral, resp. rate 15, height 5\' 10"  (1.778 m), weight 95.3 kg, SpO2 100 %. PHYSICAL EXAMINATION:    Physical Exam  Constitutional: He is oriented to person, place, and time. He appears well-developed and well-nourished.  HENT:  Head: Normocephalic and atraumatic.  Right Ear: External ear normal.  Eyes: Pupils are equal, round, and reactive to light. Conjunctivae and EOM are normal.  Neck: Normal range of motion.  Neck supple. No tracheal deviation present.  Cardiovascular: Normal rate, regular rhythm and normal heart sounds.  Respiratory: Effort normal. No stridor. No respiratory distress. He has no wheezes.  GI: Soft. Bowel sounds are normal. There is no abdominal tenderness.  Musculoskeletal: Normal range of motion.        General: No edema.  Neurological: He is alert and oriented to person, place, and time. He has normal reflexes. No cranial nerve deficit.  Skin: Skin is warm. No erythema.  Psychiatric: He has a normal mood and affect. His behavior is normal.   LABORATORY PANEL:  Male CBC Recent Labs  Lab 01/13/19 0337  WBC 4.0  HGB 11.5*  HCT 36.6*  PLT 112*   ------------------------------------------------------------------------------------------------------------------ Chemistries  Recent Labs  Lab 01/07/2019 1607 01/13/19 0337  NA 138 140  K 4.8 4.9  CL 103 104  CO2 27 25  GLUCOSE 167* 165*  BUN 32* 34*  CREATININE 2.22* 2.22*  CALCIUM 9.3 9.2  MG  --  2.1  AST 127*  --   ALT 78*  --   ALKPHOS 138*  --   BILITOT 0.9  --    RADIOLOGY:  No results found. ASSESSMENT AND PLAN:   1.  Myasthenia gravis in crisis  Initially admitted to stepdown unit.  Already evaluated by critical care physician prior to my arrival.  Patient  decided to be kept comfortable going forward without any aggressive care.  With patient and wife at bedside confirmed the same.  Neurology consult noted to have been canceled in accordance with patient's wishes.   Palliative care consult  placed to keep patient comfortable going forward.  Patient currently on morphine drip for comfort Patient transferred out of ICU today. Requiring oxygen via high flow nasal cannula at 90% with recent O2 sat of 87%.  2.  History of liver cirrhosis with ascites.  Status post recent paracentesi on 02/05/2019 with 3.65 L taken out..  Was evaluated as outpatient for possible TIPS procedure but this was placed on hold due to  underlying renal failure  Patient has decided to be kept comfortable going forward   3.  Mildly elevated troponin   4.  Chronic kidney disease stage IV  Renal function appears stable.  Avoid nephrotoxic agents.    5. BPH (benign prostatic hyperplasia) -continue home medications  DVT prophylaxis; Lovenox  All the records are reviewed and case discussed with Care Management/Social Worker. Management plans discussed with the patient, family and they are in agreement.  CODE STATUS: DNR  TOTAL TIME TAKING CARE OF THIS PATIENT: 37 minutes.   Disposition; palliative care service to see patient in a.m.  Possible discharge to hospice home or home with hospice depending on recommendations from palliative care  More than 50% of the time was spent in counseling/coordination of care: YES  POSSIBLE D/C IN 1-2 DAYS, DEPENDING ON CLINICAL CONDITION.   Honorio Devol M.D on 01/14/2019 at 12:31 PM  Between 7am to 6pm - Pager - [phone]  After 6pm go to [email or web address] - Technical brewer [facility]  Office  [phone]  CC: Primary care physician; No primary care provider on file.  Note: This dictation was prepared with Dragon dictation along with smaller phrase technology. Any transcriptional errors that result from this process are unintentional.

## 2019-01-14 NOTE — Progress Notes (Signed)
Nutrition Brief Note  Patient identified to be seen for Malnutrition Screening Tool (MST). Patient and wife known to this RD from a previous admission in January 2020. Chart reviewed. Patient has now transitioned to comfort care.   No nutrition interventions warranted at this time. Please consult RD as needed.   Willey Blade, MS, [place], LDN Office: [phone] Pager: [phone] After Hours/Weekend Pager: [phone]

## 2019-01-14 NOTE — Progress Notes (Signed)
Pt has remained in NSR on cardiac monitor. BP/HR WNL. Pt has remained on HFNC 90% for flow comfort, SpO2 87%, lung sounds are very diminished-pulling little volume. Pt has remained alert and oriented with no c/o pain. Morphine gtt -currently at 3mg /hr. Wife Jenny Reichmann) has been updated and remained at bedside. Pt with orders to transfer to med-surg-report given to [place], Therapist, sports.

## 2019-01-15 ENCOUNTER — Encounter: Payer: Self-pay | Admitting: Internal Medicine

## 2019-01-15 DIAGNOSIS — R0600 Dyspnea, unspecified: Secondary | ICD-10-CM

## 2019-01-15 DIAGNOSIS — E785 Hyperlipidemia, unspecified: Secondary | ICD-10-CM

## 2019-01-15 DIAGNOSIS — R188 Other ascites: Secondary | ICD-10-CM

## 2019-01-15 DIAGNOSIS — Z515 Encounter for palliative care: Secondary | ICD-10-CM

## 2019-01-15 DIAGNOSIS — Z7189 Other specified counseling: Secondary | ICD-10-CM

## 2019-01-15 DIAGNOSIS — Z66 Do not resuscitate: Secondary | ICD-10-CM

## 2019-01-15 LAB — GLUCOSE, CAPILLARY
GLUCOSE-CAPILLARY: 156 mg/dL — AB (ref 70–99)
Glucose-Capillary: 209 mg/dL — ABNORMAL HIGH (ref 70–99)

## 2019-01-15 MED ORDER — MORPHINE BOLUS VIA INFUSION
2.0000 mg | INTRAVENOUS | Status: DC | PRN
  Administered 2019-01-15: 2 mg via INTRAVENOUS
  Filled 2019-01-15: qty 4

## 2019-01-31 ENCOUNTER — Other Ambulatory Visit: Payer: Self-pay | Admitting: Family Medicine

## 2019-02-07 NOTE — Consult Note (Signed)
Consultation Note Date: Jan 23, 2019   Patient Name: Mason Wilson.  DOB: 12/16/1940  MRN: [id number]  Age / Sex: 78 y.o., male  PCP: No primary care provider on file. Referring Physician: Otila Back, MD  Reason for Consultation: Hospice Evaluation, Non pain symptom management, Pain control and Psychosocial/spiritual support  HPI/Patient Profile: 78 y.o. male admitted on 01/09/2019 from home with complaints of worsening shortness of breath. He has a past medical history of myasthenia gravis, coronary artery disease, non-alcoholic cirrhosis, esophageal varices, portal hypertension diabetes, hyperlipidemia, hypertension, melanoma, and chronic kidney disease stage IV. During his ED course it was reported that patient's shortness of breath has worsened with more complications over the past 2 months. 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 Prior to admission patient was seen at his PCP office and was recommended to come to ED for work-up of myasthenia exacerbation due to worsening symptoms and bilateral pleural effusions. He was evaluated prior to admission for TIPS and also underwent paracentesis on 01/26/2019 which yielded 3.65L of yellow fluid. Since admission patient continued to show signs of decline. He chose to not proceed with aggressive medical interventions with a goal of comfort. Palliative Medicine team consulted for symptom management and support.    Clinical Assessment and Goals of Care: I have reviewed medical records including lab results, imaging, Epic notes, and MAR, received report from the bedside RN, and assessed the patient. I met at the bedside with wife, Duron Meister and son to discuss diagnosis prognosis, GOC, EOL wishes, disposition and  options. Patient is actively dying. He is unresponsive.   I introduced Palliative Medicine as specialized medical care for people living with serious illness. It focuses on providing relief from the symptoms and stress of a serious illness. The goal is to improve quality of life for both the patient and the family.  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 They have been married for 40 years and have 2 children. She expressed patient enjoys golfing and fishing. He also enjoyed sports and spending time with family.   Prior to admission patient was ambulatory. She reports he has struggled with his appetite and health regarding his breathing and myasthenia gravis for several years with a more recent decline with abdominal pain, swelling, and shortness of breath over the past 2-3 months. She reported he often had some generalized weakness and fatigue also.   We discussed his current illness and what it means in the larger context of his on-going co-morbidities.  Natural disease trajectory and expectations at EOL were discussed. Wife verbalized awareness of patient's current illness. She is tearful explaining how he has suffered and dealt with health complications over the past months.   She expressed she is thankful that she had moments with him over the past few days and that patient was able to make a decision and verbalize to family he  no longer wished to continue in his current state an having multiple interventions. She acknowledges "many families do no get the opportunity to have their loved one to express their peace and wishes telling us that he wants to be kept comfortable and pass away". Support given. She expressed their 2 sons and her are at peace with his decision and knows allowing him to be comfortable is best for him.   I attempted to elicit values and goals of care important to the patient.    Patient is on full comfort measures. Family all verbalized awareness of care.     Patient has an advanced directive. His wife, Jenny Reichmann is his named HCPOA. Patient is DNR.   Hospice and Palliative Care services outpatient were explained and offered. Wife expressed that patient wishes were hopefully to transfer to residential hospice for EOL. Discussed residential hospice with wife and son. I educated them on the care and goals of their care and service. I also emphasized to family, patient has a high likelihood of passing away prior to ability to transfer to hospice home, given he is comfort care, he is unresponsive with some periods of agitation. Family verbalized understanding and appreciation. Wife remains hopeful as she discussed previous experience and the peaceful serene atmosphere compared to being in the hospital in a more institutionalize setting. Patient loved the outside and his bed was readjusted for view of outside for family's comfort.   Questions and concerns were addressed. The family was encouraged to call with questions or concerns.  PMT will continue to support holistically.   PRIMARY DECISION MAKER:  HCPOA/WIFE: CINDY Kinley     SUMMARY OF RECOMMENDATIONS    DNR/DNI   Full Comfort Care  Wife is hopeful if patient is stable he can transfer to residential hospice. Expressed his wishes were if he was able to transport that he would not want to pass away in the hospital if possible. Wife and sons made aware unsure if patient will be able to transfer, pending bed availability and stability with transport. They are aware.   Will d/c HFNC as patient is comfort. 2L/[place] or less  Continue with morphine drip as ordered by attending, will add PRN morphine bolus via infusion for pain, shortness of breath   Robinul PRN for excessive secretions  Ativan PRN for agitation  PMT will continue to support and follow   Code Status/Advance Care Planning:  DNR/DNI    Symptom Management:   Morphine drip and bolus via infusion for pain, shortness of breath   Robinul  PRN for excessive secretions  Ativan PRN for agitation   Palliative Prophylaxis:   Aspiration, Eye Care, Frequent Pain Assessment and Oral Care  Additional Recommendations (Limitations, Scope, Preferences):  Full Comfort Care  Psycho-social/Spiritual:   Desire for further Chaplaincy support:YES   Additional Recommendations: Caregiving  Support/Resources and Grief/Bereavement Support  Prognosis:   Hours - Days in the setting of full comfort care, myasthenia gravis, arthritis, non-alcoholic cirrhosis with re-ocurring ascites, diabetes, hypertension, CAD, CKD stage IV, hyperlipidemia, BPH, shortness of breath, and deconditioning.   Discharge Planning: Anticipated Hospital Death versus transfer to residential hospice.       Primary Diagnoses: Present on Admission: . BPH (benign prostatic hyperplasia) . CAD (coronary artery disease) . Cirrhosis ([place]) . CKD (chronic kidney disease), stage IV ([place]) . Hyperlipidemia . Myasthenia gravis in crisis [facility]) . Myasthenic crisis ([place])   I have reviewed the medical record, interviewed the patient and family, and examined the patient. The following  aspects are pertinent.  Past Medical History:  Diagnosis Date  . Allergy   . Arthritis    "everywhere' - worse in back  . Body mass index 39.0-39.9, adult   . CAD (coronary artery disease)   . Chronic back pain   . Cirrhosis, non-alcoholic ([place])   . Diabetes mellitus without complication ([place])   . Elevated liver enzymes   . GERD (gastroesophageal reflux disease)   . Heart murmur   . Hyperlipidemia   . Hypertension   . Hypogonadism in male   . Iron deficiency anemia due to chronic blood loss   . Melanoma ([place])    melanoma   . Melanoma in situ of right shoulder ([place])   . Myasthenia gravis ([place]) 01/2018  . Renal stones   . Sleep apnea    CPAP  . Uses hearing aid    doesn't wear   Social History   Socioeconomic History  . Marital status: Married    Spouse name: Not on file    . Number of children: Not on file  . Years of education: Not on file  . Highest education level: Not on file  Occupational History  . Not on file  Social Needs  . Financial resource strain: Not hard at all  . Food insecurity:    Worry: Never true    Inability: Never true  . Transportation needs:    Medical: No    Non-medical: No  Tobacco Use  . Smoking status: Former Smoker    Packs/day: 3.00    Years: 0.00    Pack years: 0.00    Types: Cigarettes    Last attempt to quit: 04/02/1970    Years since quitting: 48.8  . Smokeless tobacco: Former Systems developer    Types: Chew  . Tobacco comment: approx quit in 1971   Substance and Sexual Activity  . Alcohol use: No    Alcohol/week: 0.0 standard drinks    Comment: stopped in 1996  . Drug use: No  . Sexual activity: Yes    Partners: Female  Lifestyle  . Physical activity:    Days per week: 0 days    Minutes per session: 0 min  . Stress: Not at all  Relationships  . Social connections:    Talks on phone: More than three times a week    Gets together: Once a week    Attends religious service: Never    Active member of club or organization: No    Attends meetings of clubs or organizations: Never    Relationship status: Married  Other Topics Concern  . Not on file  Social History Narrative  . Not on file   Family History  Problem Relation Age of Onset  . Thyroid disease Mother   . Alzheimer's disease Mother   . Heart disease Mother   . Cancer Father 70       testicular  . Heart disease Father   . Hypertension Father   . Heart attack Father   . Kidney disease Neg Hx   . Prostate cancer Neg Hx   . Kidney cancer Neg Hx   . Bladder Cancer Neg Hx    Scheduled Meds: . mouth rinse  15 mL Mouth Rinse q12n4p   Continuous Infusions: . morphine 3 mg/hr (2019/02/11 0554)   PRN Meds:.antiseptic oral rinse, [DISCONTINUED] glycopyrrolate **OR** [DISCONTINUED] glycopyrrolate **OR** glycopyrrolate, [DISCONTINUED] LORazepam **OR**  LORazepam **OR** LORazepam, morphine, [DISCONTINUED] ondansetron **OR** ondansetron (ZOFRAN) IV, polyvinyl alcohol Medications Prior to Admission:  Prior  to Admission medications   Medication Sig Start Date End Date Taking? Authorizing Provider  Calcium Carbonate-Vit D-Min (CALCIUM 1200 PO) Take 1,200 mg by mouth.   Yes [provider]  furosemide (LASIX) 20 MG tablet Take 1 tablet (20 mg total) by mouth daily. 12/26/18  Yes Volney American, PA-C  furosemide (LASIX) 80 MG tablet Take 1 tablet (80 mg total) by mouth daily. 12/21/18  Yes Henreitta Leber, MD  glipiZIDE (GLUCOTROL) 5 MG tablet Take 1 tablet (5 mg total) by mouth daily before breakfast. 09/05/18  Yes Crissman, Jeannette How, MD  metoprolol succinate (TOPROL-XL) 50 MG 24 hr tablet Take 1 tablet (50 mg total) by mouth daily. Take with or immediately following a meal. 09/05/18  Yes Crissman, Jeannette How, MD  Multiple Vitamin (MULTIVITAMIN WITH MINERALS) TABS tablet Take 1 tablet by mouth daily. 12/01/18  Yes Vaughan Basta, MD  nystatin (MYCOSTATIN/NYSTOP) powder Apply topically 2 (two) times daily. Apply to inguinal area twice daily. 12/20/18  Yes Henreitta Leber, MD  omeprazole (PRILOSEC) 40 MG capsule Take 40 mg by mouth daily.  11/23/18  Yes [provider]  pyridostigmine (MESTINON) 60 MG tablet Take 60 mg by mouth 4 (four) times daily.   Yes [provider]  simvastatin (ZOCOR) 40 MG tablet Take 1 tablet (40 mg total) by mouth daily at 6 PM. 09/05/18  Yes Crissman, Jeannette How, MD  spironolactone (ALDACTONE) 100 MG tablet Take 1 tablet (100 mg total) by mouth daily. 12/21/18 02/19/19 Yes Sainani, Belia Heman, MD  tamsulosin (FLOMAX) 0.4 MG CAPS capsule TAKE 1 CAPSULE(0.4 MG) BY MOUTH DAILY 08/09/18  Yes McGowan, Larene Beach A, PA-C  acetaminophen (TYLENOL) 325 MG tablet Place 2 tablets (650 mg total) into feeding tube every 6 (six) hours as needed for mild pain (or Fever >/= 101). 11/30/18   Vaughan Basta, MD    nystatin-triamcinolone ointment (MYCOLOG) Apply 1 application topically 2 (two) times daily. 01/08/19   Billey Co, MD   Allergies  Allergen Reactions  . Altace [Ramipril] Cough  . Ciprofloxacin Other (See Comments)    Contraindicated in patients with myasthenia gravis    Review of Systems  Unable to perform ROS: Patient unresponsive    Physical Exam Vitals signs and nursing note reviewed.  Constitutional:      Appearance: He is well-developed.     Comments: Chronically ill, actively dying, full comfort   Cardiovascular:     Rate and Rhythm: Tachycardia present. Rhythm irregular.     Pulses: Decreased pulses.     Heart sounds: Normal heart sounds.  Pulmonary:     Effort: Accessory muscle usage present.     Breath sounds: Decreased breath sounds present.     Comments: HFNC  Abdominal:     General: Bowel sounds are decreased.     Palpations: Abdomen is soft.  Skin:    General: Skin is warm and dry.  Neurological:     Mental Status: He is unresponsive.     Comments: Actively dying   Psychiatric:        Judgment: Judgment is inappropriate.    Vital Signs: BP (!) 141/71 (BP Location: Left Arm)   Pulse (!) 130   Temp 98.2 F (36.8 C) (Oral)   Resp 20   Ht 5' 10" (1.778 m)   Wt 95.3 kg   SpO2 92%   BMI 30.15 kg/m  Pain Scale: 0-10   Pain Score: Asleep   SpO2: SpO2: 92 % O2 Device:SpO2: 92 % O2 Flow  Rate: .O2 Flow Rate (L/min): 2 L/min  IO: Intake/output summary:   Intake/Output Summary (Last 24 hours) at 2019-01-22 1624 Last data filed at 2019/01/22 1400 Gross per 24 hour  Intake 28.95 ml  Output -  Net 28.95 ml    LBM: Last BM Date: 01/16/2019 Baseline Weight: Weight: 96.9 kg Most recent weight: Weight: 95.3 kg     Palliative Assessment/Data: ACTIVELY DYING    Time In: 1000 Time Out: 1130 Time Total:90 MIN.  Greater than 50%  of this time was spent counseling and coordinating care related to the above assessment and plan.  Signed by:  Alda Lea, AGPCNP-BC Palliative Medicine Team  Phone: [phone] Fax: [phone] Pager: [phone] Amion: Bjorn Pippin    Please contact Palliative Medicine Team phone at [phone] for questions and concerns.  For individual provider: See Shea Evans

## 2019-02-07 NOTE — Care Management Important Message (Signed)
Important Message  Patient Details  Name: Mason Wilson. MRN: [id number] Date of Birth: 09/13/1941   Medicare Important Message Given:  Yes    Juliann Pulse A Emile Ringgenberg 24-Jan-2019, 11:24 AM

## 2019-02-07 NOTE — Progress Notes (Signed)
Pt resting in bed with no acute distress. Wife remains at bedside with pt.

## 2019-02-07 NOTE — Progress Notes (Signed)
New hospice home referral received from Mason Wilson following a Palliative medicine consult. Writer spoke in the room with patient's wife Mason Wilson to initiate education regarding hospice services, philosophy and team approach to care with understanding voiced. Mason Wilson is aware there is currently no bed available today. She plans to be back at the hospital in the morning. She did voice understanding that patient may not be stable enough in the morning, but remains hopeful that he will be able to transport tomorrow.  Patient seen sitting up in bed, eyes closed, no response to verbal or tactile stimuli. Currently receiving morphine 3 mg/hr continuous, has required 2 bolus doses since 1 am. His breathing appeared shallow, unlabored, facial muscles relaxed. Emotional support given. Plan to meet again with Mason Wilson in the morning. CSW [name] made aware. Patient information faxed to referral. [name] BSN, RN,. [facility]) [phone]

## 2019-02-07 NOTE — Progress Notes (Signed)
[place] at [place] NAME: Mason Wilson    MR#:  [id number]  DATE OF BIRTH:  [birth year]  SUBJECTIVE:  CHIEF COMPLAINT:   Chief Complaint  Patient presents with  . Shortness of Breath   No new complaints.  Patient currently on comfort care measures only.  Resting comfortably.  Requiring morphine drip.  Hospice referral already made. Wife at bedside updated and all questions were answered.  Wife prefers patient go to hospice home on discharge  REVIEW OF SYSTEMS:  Unobtainable at this time due to patient being sleepy from effect of morphine drip  DRUG ALLERGIES:   Allergies  Allergen Reactions  . Altace [Ramipril] Cough  . Ciprofloxacin Other (See Comments)    Contraindicated in patients with myasthenia gravis    VITALS:  Blood pressure (!) 141/71, pulse (!) 130, temperature 98.2 F (36.8 C), temperature source Oral, resp. rate 20, height 5\' 10"  (1.778 m), weight 95.3 kg, SpO2 99 %. PHYSICAL EXAMINATION:    Physical Exam  Constitutional: He appears well-developed and well-nourished. No distress.  Patient currently resting comfortably.  On morphine drip  HENT:  Head: Normocephalic and atraumatic.  Right Ear: External ear normal.  Eyes: Pupils are equal, round, and reactive to light. Conjunctivae and EOM are normal.  Neck: Normal range of motion. Neck supple. No tracheal deviation present.  Cardiovascular: Normal rate, regular rhythm and normal heart sounds.  Respiratory: Effort normal. No stridor. No respiratory distress. He has no wheezes.  GI: Soft. Bowel sounds are normal. There is no abdominal tenderness.  Musculoskeletal: Normal range of motion.        General: No edema.  Neurological: He has normal reflexes. No cranial nerve deficit.  Patient currently resting comfortably on morphine drip  Skin: Skin is warm. No erythema.  Psychiatric: He has a normal mood and affect. His behavior is normal.   LABORATORY PANEL:   Male CBC Recent Labs  Lab 01/13/19 0337  WBC 4.0  HGB 11.5*  HCT 36.6*  PLT 112*   ------------------------------------------------------------------------------------------------------------------ Chemistries  Recent Labs  Lab 02/03/2019 1607 01/13/19 0337  NA 138 140  K 4.8 4.9  CL 103 104  CO2 27 25  GLUCOSE 167* 165*  BUN 32* 34*  CREATININE 2.22* 2.22*  CALCIUM 9.3 9.2  MG  --  2.1  AST 127*  --   ALT 78*  --   ALKPHOS 138*  --   BILITOT 0.9  --    RADIOLOGY:  No results found. ASSESSMENT AND PLAN:   1.  Myasthenia gravis in crisis  Initially admitted to stepdown unit.  Already evaluated by critical care physician prior to my arrival.  Patient  decided to be kept comfortable going forward without any aggressive care.  With patient and wife at bedside confirmed the same.  Neurology consult noted to have been canceled in accordance with patient's wishes.   Palliative care consult placed to keep patient comfortable going forward.  Patient already seen by palliative care team.  Hospice referral made.  Patient would likely benefit from hospice home on discharge.  Wife agreeable.  Patient currently on morphine drip for comfort Requiring oxygen via high flow nasal cannula for comfort  2.  History of liver cirrhosis with ascites.  Status post recent paracentesi on 01/14/2019 with 3.65 L taken out..  Was evaluated as outpatient for possible TIPS procedure but this was placed on hold due to underlying renal failure  Patient has decided to be kept  comfortable going forward   3.  Mildly elevated troponin   4.  Chronic kidney disease stage IV  Renal function appears stable.  Avoid nephrotoxic agents.    5. BPH (benign prostatic hyperplasia) -continue home medications  DVT prophylaxis; Lovenox  All the records are reviewed and case discussed with Care Management/Social Worker. Management plans discussed with the patient, family and they are in agreement.  CODE STATUS:  DNR  TOTAL TIME TAKING CARE OF THIS PATIENT: 26 minutes.   Disposition; anticipate discharge to hospice home once bed available   more than 50% of the time was spent in counseling/coordination of care: YES  POSSIBLE D/C IN 1 DAY, DEPENDING ON CLINICAL CONDITION.   Mason Wilson M.D on 02-07-19 at 1:14 PM  Between 7am to 6pm - Pager - [phone]  After 6pm go to [email or web address] - [employer] [facility]  Office  [phone]  CC: Primary care physician; No primary care provider on file.  Note: This dictation was prepared with Dragon dictation along with smaller phrase technology. Any transcriptional errors that result from this process are unintentional.

## 2019-02-07 NOTE — Death Summary Note (Signed)
   [place] at [facility]    Death Note   Chief complaint; shortness of breath  History of presenting complaint; Mason Wilson  is a 78 y.o. male who presented to the ED with a complaint of worsening shortness of breath.  He has a history of myasthenia gravis, and states that for the past 2 months his breathing is been getting worse.  He also has a history of cirrhosis and has had frequent paracentesis done.  Couple of months ago he was hospitalized and required intubation and treatment for sepsis due to prostate abscesses.  He then had drainage of his abscesses.  Since that time his breathing has been getting worse.  It is significantly worse over the last few days, which led him come to the ED.  On evaluation in the ED NIF was performed and was consistently less than 20, around 16.  Strong concern for myasthenia exacerbation.  Hospitalist called for admission.  Please refer to the H&P dictated for further details   Hospital course ; 1.Myasthenia gravis in crisis  Initially admitted to stepdown unit.    Patient was evaluated by critical care physician prior to my arrival.  Patient  decided to be kept comfortable going forward without any aggressive care.  With patient and wife at bedside confirmed the same.  Neurology consult noted to have been canceled in accordance with patient's wishes.  Palliative care consult placed to keep patient comfortable going forward.  Patient already seen by palliative care team.  Hospice referral made with plans in place to have patient transferred to hospice home the next day.  Patient was placed on morphine drip and high flow oxygen via nasal cannula for comfort. Patient was subsequently pronounced dead on January 25, 2019 at 1730.  Wife was at bedside.  2.  History of liver cirrhosis with ascites.  Status post recent paracentesi on 02/03/2019 with 3.65 L taken out..  Was evaluated as outpatient for possible TIPS procedure but this was  placed on hold due to underlying renal failure .Patient has decided to be kept comfortable and subsequently died on 01/25/19.    3.  Mildly elevated troponin  Likely due to demand ischemia.  4.  Chronic kidney disease stage IV  Renal function appears stable.  Avoid nephrotoxic agents.    5.BPH (benign prostatic hyperplasia) -continue home medications   Mason Wilson [id number] is a 78 y.o. male, Outpatient Primary MD for the patient is No primary care provider on file.  Pronounced dead by 2 registered nurses on   2019/01/25   @ 1730             Cause of death ; myasthenia gravis crisis   Mason Wilson M.D on 01/16/2019 at 3:46 PM  [place] at [place]  Total clinical and documentation time for today Under 30 minutes

## 2019-02-07 NOTE — Progress Notes (Signed)
Respirations more labored at this time. 02 sat 92% on 2L. Morphine 2mg  bolus given via IV for comfort and ease of breathing.

## 2019-02-07 NOTE — Clinical Social Work Note (Signed)
Patient is comfort care and family is requesting hospice home in [place]. Palliative NP saw patient today and states that patient is appropriate for hospice home. CSW notified Santiago Glad with Hospice of referral. CSW will continue to follow for discharge planning.   [place], [place]

## 2019-02-07 NOTE — Progress Notes (Signed)
This nurse in to give bolus due to increase respiration after turning patient to clean him.  Patient had agonal breathing, patient's wife Jenny Reichmann called to make her aware.  Let her know that she may want to return to the hospital due to breathing change and that it may be too late when she arrives.  She voiced understanding and it on the way.  Patient then expired at 1730.  Paged Dr. Stark Jock and made him aware.  Ann,AC also aware.  COPA also made aware and patient is a potential skin donor, reference [id number].  Clarise Cruz, BSN

## 2019-02-07 DEATH — deceased

## 2019-03-01 ENCOUNTER — Ambulatory Visit: Payer: Medicare Other | Admitting: Family Medicine

## 2019-03-28 ENCOUNTER — Ambulatory Visit: Payer: Medicare Other

## 2019-03-28 ENCOUNTER — Other Ambulatory Visit: Payer: Medicare Other

## 2019-03-28 ENCOUNTER — Ambulatory Visit: Payer: Medicare Other | Admitting: Internal Medicine

## 2019-04-23 ENCOUNTER — Ambulatory Visit: Payer: Medicare Other | Admitting: Urology

## 2019-05-09 ENCOUNTER — Ambulatory Visit: Payer: Medicare Other

## 2019-08-09 ENCOUNTER — Other Ambulatory Visit: Payer: Medicare Other

## 2019-08-14 ENCOUNTER — Ambulatory Visit: Payer: Medicare Other | Admitting: Urology

## 2020-07-19 IMAGING — CT CT CHEST W/ CM
2 of 5 series · 13 of 36 positions shown, 16 images · IV contrast (omnipaque)
Comparison: [address] chest CT.  08/01/2015 CT abdomen/pelvis.

CLINICAL DATA: Sepsis. Dyspnea. Nausea. Diarrhea. Abdominal
distention.

EXAM:
CT CHEST, ABDOMEN, AND PELVIS WITH CONTRAST
TECHNIQUE: Multidetector CT imaging of the chest, abdomen and pelvis was
performed following the standard protocol during bolus
administration of intravenous contrast.
CONTRAST:  100mL OMNIPAQUE IOHEXOL 300 MG/ML  SOLN

[Series 5: coronals · coronal · 0.93mm/px · 3 of 158 slices shown]
[im 32/158  lung]
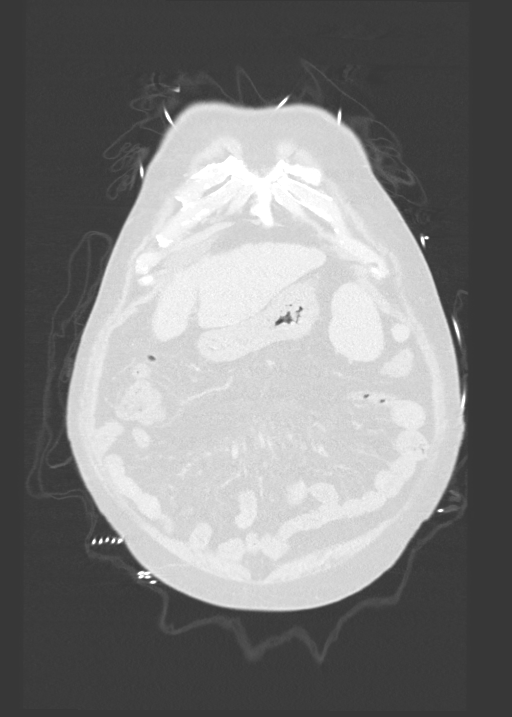
[im 63/158  lung]
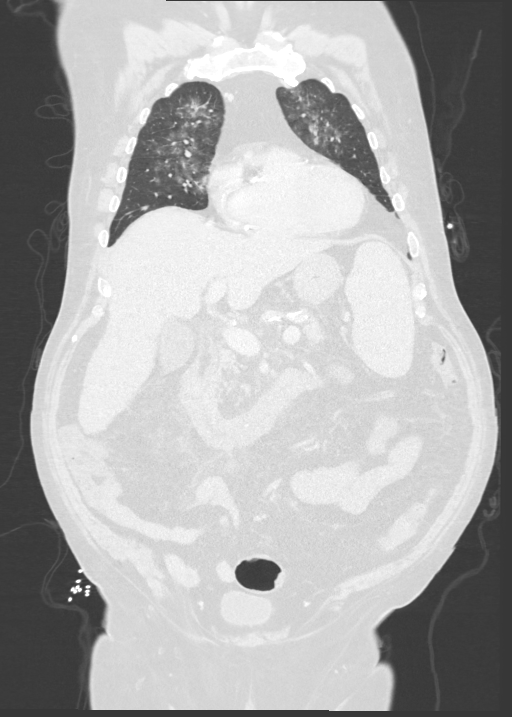
[im 95/158  lung]
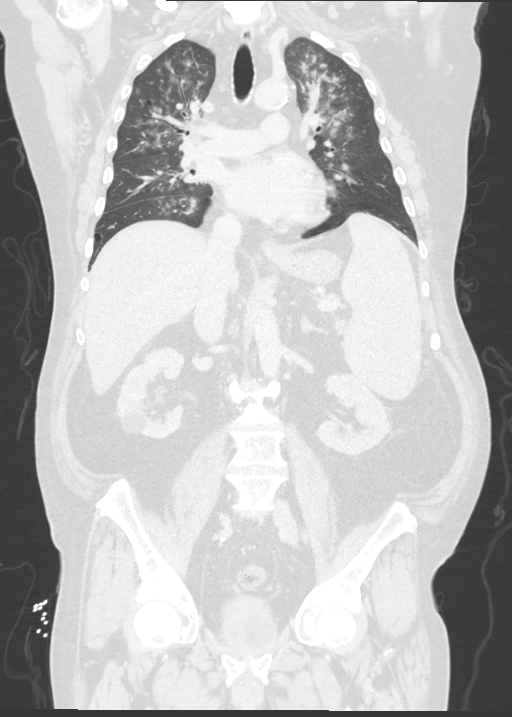

[Series 8: cap with · axial · 0.87mm/px · z∈[-1036,-496]mm · 10 of 133 slices shown, 13 images]
[im 13/133  mediastinal]
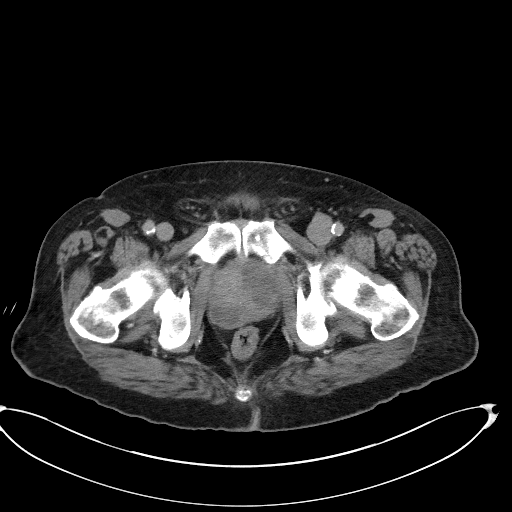
[im 13/133  lung]
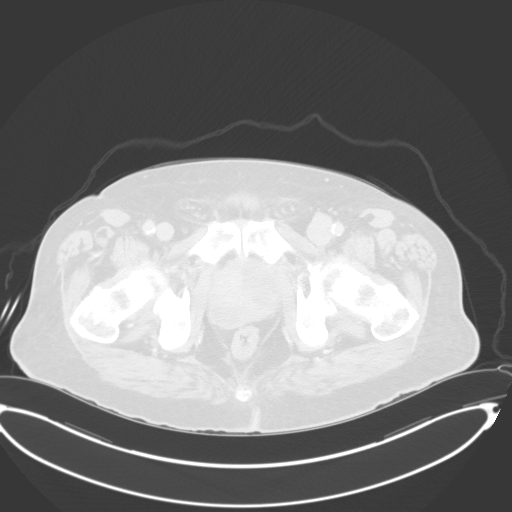
[im 25/133  lung]
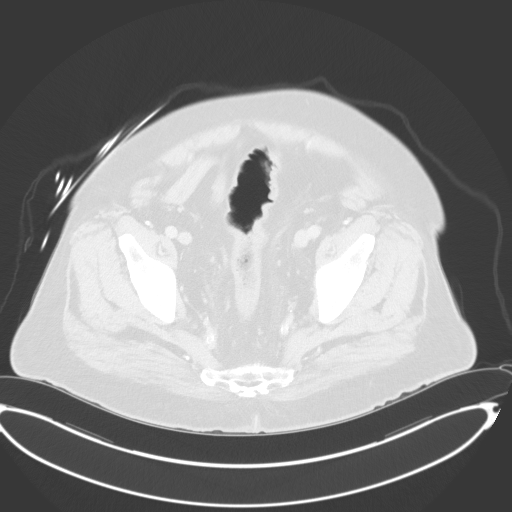
[im 37/133  lung]
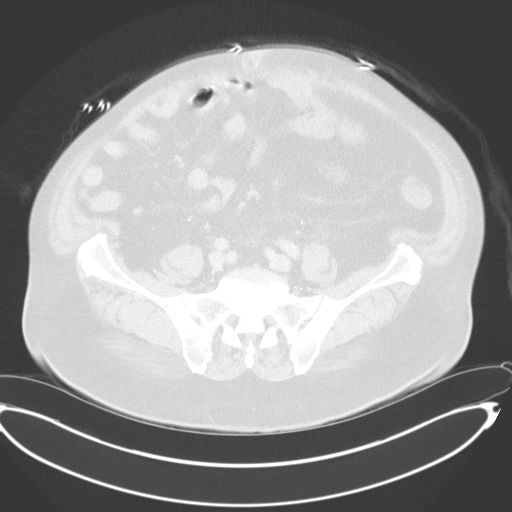
[im 49/133  lung]
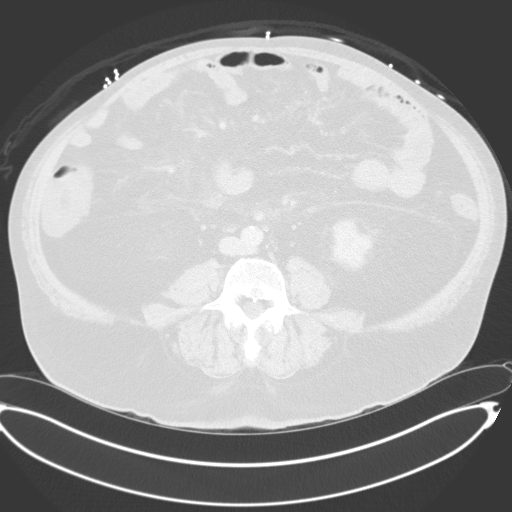
[im 61/133  mediastinal]
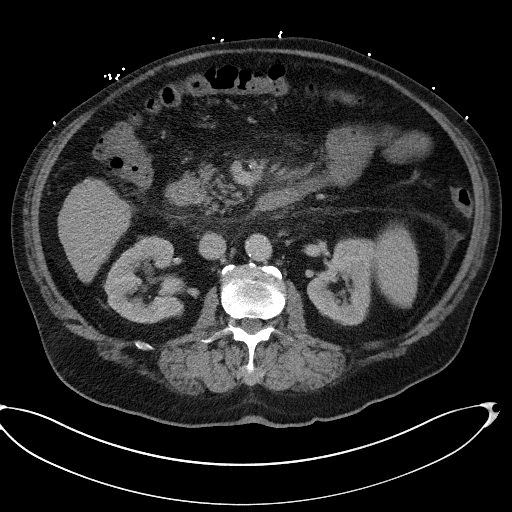
[im 61/133  lung]
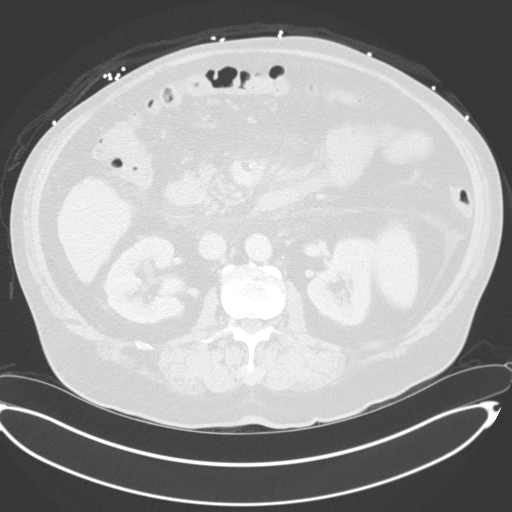
[im 73/133  lung]
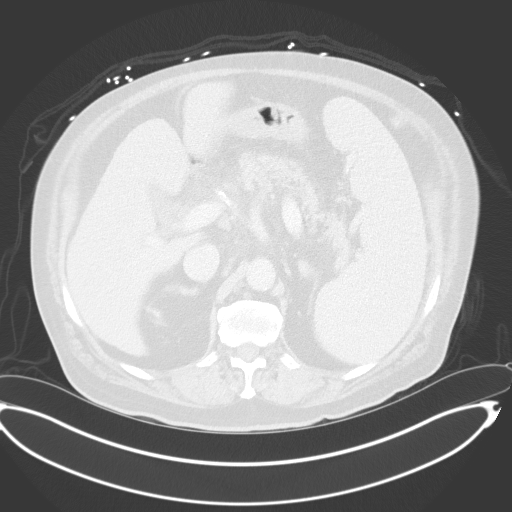
[im 85/133  lung]
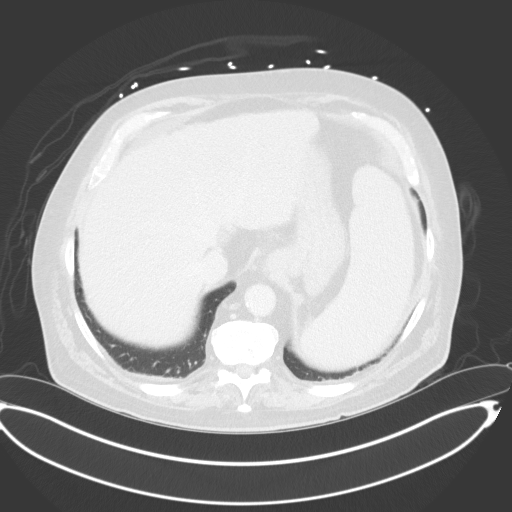
[im 97/133  lung]
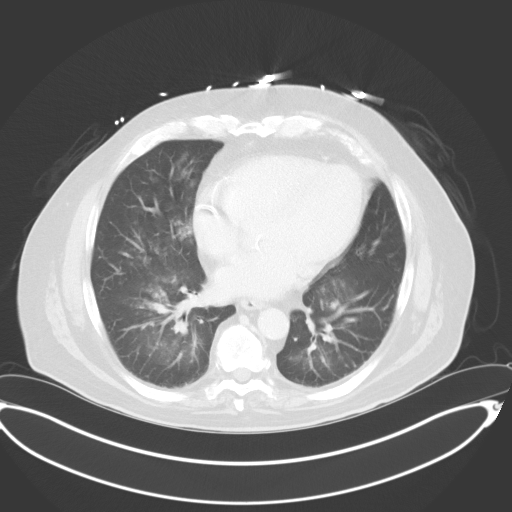
[im 109/133  mediastinal]
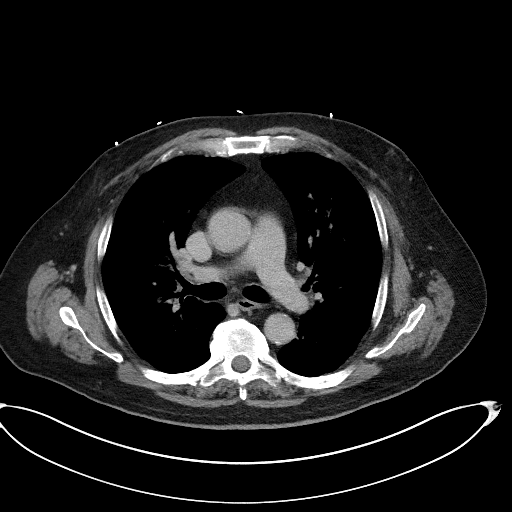
[im 109/133  lung]
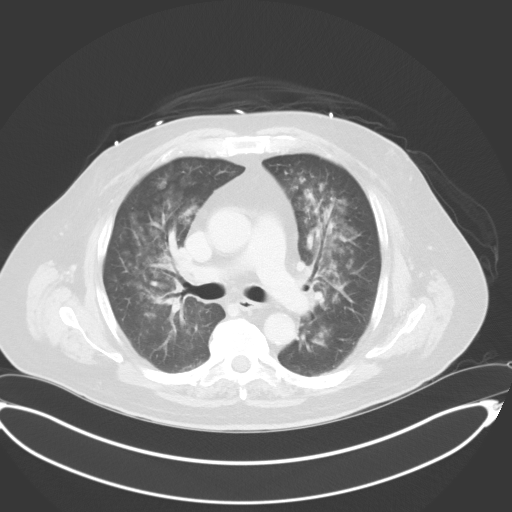
[im 121/133  lung]
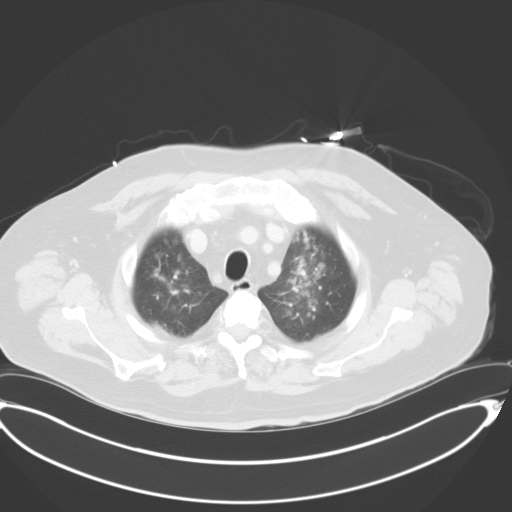

[13 of 36 positions shown; findings below may reference images not displayed]

FINDINGS: CT CHEST FINDINGS

Cardiovascular: Top-normal heart size. No significant pericardial
effusion/thickening. Left main and 3 vessel coronary
atherosclerosis. Atherosclerotic nonaneurysmal thoracic aorta.
Top-normal caliber main pulmonary artery (3.4 cm diameter). No
central pulmonary emboli.

Mediastinum/Nodes: No discrete thyroid nodules. Unremarkable
esophagus. No pathologically enlarged axillary, mediastinal or hilar
lymph nodes.

Lungs/Pleura: No pneumothorax. No pleural effusion. There is
relatively symmetric patchy parahilar consolidation and ground-glass
opacity in both lungs, upper lobe predominant. No lung masses.

Musculoskeletal: No aggressive appearing focal osseous lesions.
Moderate thoracic spondylosis.

CT ABDOMEN PELVIS FINDINGS

Hepatobiliary: Diffusely irregular liver surface compatible with
hepatic cirrhosis. No liver masses. Normal gallbladder with no
radiopaque cholelithiasis. No biliary ductal dilatation.

Pancreas: There is diffuse haziness of the peripancreatic fat
extending into anterior paranephric retroperitoneal spaces
bilaterally, suggestive of acute pancreatitis. No peripancreatic
fluid collections. Preserved pancreatic parenchymal enhancement. No
pancreatic mass or duct dilation.

Spleen: Mild to moderate splenomegaly with craniocaudal splenic
length 16.2 cm. No splenic mass.

Adrenals/Urinary Tract: Normal adrenals. No hydronephrosis. Simple
2.3 cm lateral lower right renal cyst. Normal bladder.

Stomach/Bowel: Normal non-distended stomach. Normal caliber small
bowel with no small bowel wall thickening. Normal appendix. Normal
large bowel with no diverticulosis, large bowel wall thickening or
pericolonic fat stranding.

Vascular/Lymphatic: Atherosclerotic nonaneurysmal abdominal aorta.
Patent portal, splenic, hepatic and renal veins. No pathologically
enlarged lymph nodes in the abdomen or pelvis.

Reproductive: Moderate-to-marked prostatomegaly with bilateral
prostatic fluid collections measuring 3.3 x 3.0 cm on the right and
4.0 x 2.9 cm on the left, new since 08/01/2015 CT. Haziness of the
periprostatic fat.

Other: No pneumoperitoneum, ascites or focal fluid collection.

Musculoskeletal: No aggressive appearing focal osseous lesions.
Moderate lumbar spondylosis.
IMPRESSION: 1. Relatively symmetric patchy parahilar consolidation and
ground-glass opacity in both lungs, upper lobe predominant. Favor
multilobar bronchopneumonia, although flash pulmonary edema could
also have this appearance.
2. Acute uncomplicated pancreatitis.
3. Moderate to marked prostatomegaly with bilateral prostatic fluid
collections and haziness of the perivesical fat, new since 6562 CT,
most compatible with acute prostatitis complicated by intraprostatic
abscesses.
4. Hepatic cirrhosis.  Splenomegaly.  No ascites.
5. Left main and 3 vessel coronary atherosclerosis.
6.  Aortic Atherosclerosis ([id number]).
